# Patient Record
Sex: Female | Born: 1960
Health system: Southern US, Community
[De-identification: ages and names within clinical notes are randomized; demographics above are authoritative.]

## PROBLEM LIST (undated history)

## (undated) DIAGNOSIS — I10 Essential (primary) hypertension: Secondary | ICD-10-CM

## (undated) DIAGNOSIS — D689 Coagulation defect, unspecified: Secondary | ICD-10-CM

## (undated) DIAGNOSIS — D649 Anemia, unspecified: Secondary | ICD-10-CM

## (undated) DIAGNOSIS — T7840XA Allergy, unspecified, initial encounter: Secondary | ICD-10-CM

## (undated) DIAGNOSIS — M1990 Unspecified osteoarthritis, unspecified site: Secondary | ICD-10-CM

## (undated) DIAGNOSIS — D573 Sickle-cell trait: Secondary | ICD-10-CM

## (undated) DIAGNOSIS — R0602 Shortness of breath: Secondary | ICD-10-CM

## (undated) HISTORY — DX: Sickle-cell trait: D57.3

## (undated) HISTORY — DX: Unspecified osteoarthritis, unspecified site: M19.90

## (undated) HISTORY — DX: Essential (primary) hypertension: I10

## (undated) HISTORY — DX: Anemia, unspecified: D64.9

## (undated) HISTORY — DX: Coagulation defect, unspecified: D68.9

## (undated) HISTORY — DX: Allergy, unspecified, initial encounter: T78.40XA

---

## 2003-02-28 ENCOUNTER — Emergency Department (HOSPITAL_COMMUNITY): Admission: EM | Admit: 2003-02-28 | Discharge: 2003-02-28 | Payer: Self-pay | Admitting: Emergency Medicine

## 2003-02-28 ENCOUNTER — Encounter: Payer: Self-pay | Admitting: Emergency Medicine

## 2003-10-28 ENCOUNTER — Emergency Department (HOSPITAL_COMMUNITY): Admission: EM | Admit: 2003-10-28 | Discharge: 2003-10-28 | Payer: Self-pay | Admitting: Family Medicine

## 2003-11-03 ENCOUNTER — Emergency Department (HOSPITAL_COMMUNITY): Admission: EM | Admit: 2003-11-03 | Discharge: 2003-11-03 | Payer: Self-pay | Admitting: Family Medicine

## 2003-11-18 ENCOUNTER — Encounter: Admission: RE | Admit: 2003-11-18 | Discharge: 2003-11-18 | Payer: Self-pay | Admitting: Internal Medicine

## 2003-12-01 ENCOUNTER — Encounter: Admission: RE | Admit: 2003-12-01 | Discharge: 2003-12-01 | Payer: Self-pay | Admitting: Internal Medicine

## 2004-01-19 ENCOUNTER — Emergency Department (HOSPITAL_COMMUNITY): Admission: EM | Admit: 2004-01-19 | Discharge: 2004-01-19 | Payer: Self-pay | Admitting: Emergency Medicine

## 2005-02-05 ENCOUNTER — Ambulatory Visit: Payer: Self-pay | Admitting: *Deleted

## 2005-02-05 ENCOUNTER — Ambulatory Visit: Payer: Self-pay | Admitting: Nurse Practitioner

## 2005-02-26 ENCOUNTER — Ambulatory Visit: Payer: Self-pay | Admitting: Nurse Practitioner

## 2005-03-01 ENCOUNTER — Ambulatory Visit: Payer: Self-pay | Admitting: Nurse Practitioner

## 2005-03-26 ENCOUNTER — Ambulatory Visit: Payer: Self-pay | Admitting: Nurse Practitioner

## 2005-08-05 ENCOUNTER — Ambulatory Visit: Payer: Self-pay | Admitting: Family Medicine

## 2005-12-04 ENCOUNTER — Ambulatory Visit: Payer: Self-pay | Admitting: Nurse Practitioner

## 2005-12-13 ENCOUNTER — Encounter: Admission: RE | Admit: 2005-12-13 | Discharge: 2005-12-13 | Payer: Self-pay | Admitting: Internal Medicine

## 2006-02-06 ENCOUNTER — Ambulatory Visit: Payer: Self-pay | Admitting: Nurse Practitioner

## 2006-05-15 ENCOUNTER — Ambulatory Visit: Payer: Self-pay | Admitting: Nurse Practitioner

## 2006-10-27 ENCOUNTER — Ambulatory Visit: Payer: Self-pay | Admitting: Nurse Practitioner

## 2006-12-18 ENCOUNTER — Ambulatory Visit: Payer: Self-pay | Admitting: Nurse Practitioner

## 2007-01-21 ENCOUNTER — Ambulatory Visit: Payer: Self-pay | Admitting: Nurse Practitioner

## 2007-05-26 ENCOUNTER — Ambulatory Visit: Payer: Self-pay | Admitting: Internal Medicine

## 2007-05-26 ENCOUNTER — Encounter (INDEPENDENT_AMBULATORY_CARE_PROVIDER_SITE_OTHER): Payer: Self-pay | Admitting: Nurse Practitioner

## 2007-05-26 LAB — CONVERTED CEMR LAB
Albumin: 4 g/dL (ref 3.5–5.2)
CO2: 24 meq/L (ref 19–32)
Glucose, Bld: 103 mg/dL — ABNORMAL HIGH (ref 70–99)
HCT: 32.1 % — ABNORMAL LOW (ref 36.0–46.0)
Lymphocytes Relative: 42 % (ref 12–46)
Lymphs Abs: 1.7 10*3/uL (ref 0.7–3.3)
Neutrophils Relative %: 49 % (ref 43–77)
Platelets: 271 10*3/uL (ref 150–400)
Potassium: 3.8 meq/L (ref 3.5–5.3)
Sodium: 140 meq/L (ref 135–145)
Total Protein: 7.1 g/dL (ref 6.0–8.3)
WBC: 4.1 10*3/uL (ref 4.0–10.5)

## 2007-05-27 ENCOUNTER — Encounter (INDEPENDENT_AMBULATORY_CARE_PROVIDER_SITE_OTHER): Payer: Self-pay | Admitting: *Deleted

## 2007-07-24 ENCOUNTER — Ambulatory Visit: Payer: Self-pay | Admitting: Family Medicine

## 2007-07-24 ENCOUNTER — Encounter (INDEPENDENT_AMBULATORY_CARE_PROVIDER_SITE_OTHER): Payer: Self-pay | Admitting: Nurse Practitioner

## 2007-07-24 LAB — CONVERTED CEMR LAB
AST: 15 units/L (ref 0–37)
Albumin: 4 g/dL (ref 3.5–5.2)
BUN: 13 mg/dL (ref 6–23)
Basophils Relative: 1 % (ref 0–1)
Calcium: 9.2 mg/dL (ref 8.4–10.5)
Chloride: 104 meq/L (ref 96–112)
HDL: 55 mg/dL (ref >39)
Lymphs Abs: 1.9 10*3/uL (ref 0.7–4.0)
Monocytes Relative: 9 % (ref 3–12)
Neutro Abs: 2.2 10*3/uL (ref 1.7–7.7)
Neutrophils Relative %: 49 % (ref 43–77)
Potassium: 4.7 meq/L (ref 3.5–5.3)
RBC: 4.54 M/uL (ref 3.87–5.11)
TSH: 3.984 microintl units/mL (ref 0.350–5.50)
Total Protein: 7.3 g/dL (ref 6.0–8.3)
WBC: 4.6 10*3/uL (ref 4.0–10.5)

## 2009-01-31 ENCOUNTER — Ambulatory Visit: Payer: Self-pay | Admitting: Internal Medicine

## 2009-02-07 ENCOUNTER — Ambulatory Visit (HOSPITAL_COMMUNITY): Admission: RE | Admit: 2009-02-07 | Discharge: 2009-02-07 | Payer: Self-pay | Admitting: Internal Medicine

## 2009-02-08 ENCOUNTER — Ambulatory Visit: Payer: Self-pay | Admitting: Cardiology

## 2009-02-08 ENCOUNTER — Encounter: Payer: Self-pay | Admitting: Internal Medicine

## 2009-02-08 ENCOUNTER — Ambulatory Visit (HOSPITAL_COMMUNITY): Admission: RE | Admit: 2009-02-08 | Discharge: 2009-02-08 | Payer: Self-pay | Admitting: Internal Medicine

## 2009-05-02 ENCOUNTER — Ambulatory Visit: Payer: Self-pay | Admitting: Family Medicine

## 2009-07-17 ENCOUNTER — Ambulatory Visit: Payer: Self-pay | Admitting: Internal Medicine

## 2009-07-18 ENCOUNTER — Encounter (INDEPENDENT_AMBULATORY_CARE_PROVIDER_SITE_OTHER): Payer: Self-pay | Admitting: Internal Medicine

## 2009-08-29 ENCOUNTER — Ambulatory Visit: Payer: Self-pay | Admitting: Family Medicine

## 2009-08-29 LAB — CONVERTED CEMR LAB
BUN: 13 mg/dL (ref 6–23)
CO2: 26 meq/L (ref 19–32)
Chloride: 103 meq/L (ref 96–112)
Eosinophils Relative: 2 % (ref 0–5)
Glucose, Bld: 92 mg/dL (ref 70–99)
HCT: 35.3 % — ABNORMAL LOW (ref 36.0–46.0)
Hemoglobin: 11.3 g/dL — ABNORMAL LOW (ref 12.0–15.0)
Lymphocytes Relative: 43 % (ref 12–46)
Lymphs Abs: 1.7 10*3/uL (ref 0.7–4.0)
Monocytes Absolute: 0.3 10*3/uL (ref 0.1–1.0)
Monocytes Relative: 8 % (ref 3–12)
Potassium: 4.2 meq/L (ref 3.5–5.3)
RBC: 4.48 M/uL (ref 3.87–5.11)
RDW: 15.8 % — ABNORMAL HIGH (ref 11.5–15.5)
Sodium: 140 meq/L (ref 135–145)
WBC: 3.8 10*3/uL — ABNORMAL LOW (ref 4.0–10.5)

## 2009-09-05 ENCOUNTER — Ambulatory Visit: Payer: Self-pay | Admitting: Family Medicine

## 2009-09-22 ENCOUNTER — Ambulatory Visit: Payer: Self-pay | Admitting: Internal Medicine

## 2009-10-19 ENCOUNTER — Ambulatory Visit: Payer: Self-pay | Admitting: Family Medicine

## 2011-02-28 ENCOUNTER — Inpatient Hospital Stay (HOSPITAL_COMMUNITY)
Admission: AD | Admit: 2011-02-28 | Discharge: 2011-02-28 | Disposition: A | Discharge status: Home / Self Care | Payer: Medicaid Other | Source: Ambulatory Visit | Attending: Obstetrics & Gynecology | Admitting: Obstetrics & Gynecology

## 2011-02-28 ENCOUNTER — Inpatient Hospital Stay (HOSPITAL_COMMUNITY): Payer: Medicaid Other

## 2011-02-28 DIAGNOSIS — N390 Urinary tract infection, site not specified: Secondary | ICD-10-CM | POA: Insufficient documentation

## 2011-02-28 DIAGNOSIS — N898 Other specified noninflammatory disorders of vagina: Secondary | ICD-10-CM

## 2011-02-28 DIAGNOSIS — D259 Leiomyoma of uterus, unspecified: Secondary | ICD-10-CM

## 2011-02-28 DIAGNOSIS — I10 Essential (primary) hypertension: Secondary | ICD-10-CM | POA: Insufficient documentation

## 2011-02-28 DIAGNOSIS — Z79899 Other long term (current) drug therapy: Secondary | ICD-10-CM | POA: Insufficient documentation

## 2011-02-28 LAB — URINE MICROSCOPIC-ADD ON

## 2011-02-28 LAB — URINALYSIS, ROUTINE W REFLEX MICROSCOPIC
Glucose, UA: NEGATIVE mg/dL
Ketones, ur: NEGATIVE mg/dL
Protein, ur: NEGATIVE mg/dL
pH: 5.5 (ref 5.0–8.0)

## 2011-02-28 LAB — WET PREP, GENITAL
Clue Cells Wet Prep HPF POC: NONE SEEN
Trich, Wet Prep: NONE SEEN

## 2011-02-28 LAB — POCT PREGNANCY, URINE: Preg Test, Ur: NEGATIVE

## 2011-03-01 LAB — GC/CHLAMYDIA PROBE AMP, GENITAL: Chlamydia, DNA Probe: NEGATIVE

## 2011-04-18 ENCOUNTER — Other Ambulatory Visit (HOSPITAL_COMMUNITY)
Admission: RE | Admit: 2011-04-18 | Discharge: 2011-04-18 | Disposition: A | Discharge status: Home / Self Care | Payer: Medicaid Other | Source: Ambulatory Visit | Attending: Family Medicine | Admitting: Family Medicine

## 2011-04-18 ENCOUNTER — Encounter: Payer: Self-pay | Admitting: Family Medicine

## 2011-04-18 ENCOUNTER — Ambulatory Visit (INDEPENDENT_AMBULATORY_CARE_PROVIDER_SITE_OTHER): Payer: Self-pay | Admitting: Family Medicine

## 2011-04-18 VITALS — BP 190/117 | HR 78 | Temp 97.9°F | Ht 66.0 in | Wt 260.0 lb

## 2011-04-18 DIAGNOSIS — I152 Hypertension secondary to endocrine disorders: Secondary | ICD-10-CM | POA: Insufficient documentation

## 2011-04-18 DIAGNOSIS — D259 Leiomyoma of uterus, unspecified: Secondary | ICD-10-CM

## 2011-04-18 DIAGNOSIS — Z01419 Encounter for gynecological examination (general) (routine) without abnormal findings: Secondary | ICD-10-CM

## 2011-04-18 DIAGNOSIS — N921 Excessive and frequent menstruation with irregular cycle: Secondary | ICD-10-CM | POA: Insufficient documentation

## 2011-04-18 DIAGNOSIS — I10 Essential (primary) hypertension: Secondary | ICD-10-CM

## 2011-04-18 LAB — POCT PREGNANCY, URINE: Preg Test, Ur: NEGATIVE

## 2011-04-18 MED ORDER — NAPROXEN 500 MG PO TABS: 500.0000 mg | ORAL_TABLET | Freq: Two times a day (BID) | ORAL | Status: DC

## 2011-04-18 MED ORDER — MEDROXYPROGESTERONE ACETATE 10 MG PO TABS: 10.0000 mg | ORAL_TABLET | Freq: Every day | ORAL | Status: DC

## 2011-04-18 NOTE — Assessment & Plan Note (Signed)
CBC,TSH Endometrial biopsy

## 2011-04-18 NOTE — Assessment & Plan Note (Signed)
Consider Depo Lupron +/- definitive surgery after that with BP control.

## 2011-04-18 NOTE — Assessment & Plan Note (Signed)
Referral to [facility]

## 2011-04-18 NOTE — Patient Instructions (Addendum)
Menorrhagia (Heavy Periods) During a normal menstrual period, most women lose about 2 ounces of blood or less. If there is a lot more blood loss than this, it is called menorrhagia. HOME CARE  Only take medicine as directed by your doctor.   Heavy bleeding may cause you to lack iron in your body. Your doctor may prescribe iron pills. Take as told to you by your doctor.   Do not take aspirin one week before or during your period. Aspirin can make the bleeding worse.   If you change your tampon or pad more than once every 2 hours, stay in bed for a while. This may help lessen the bleeding.   Eat a healthy diet. Eat foods with iron. These foods include leafy green vegetables, meat, liver, eggs and whole grain breads and cereals.   Eat foods that are high in Vitamin C. These include oranges, orange juice, and grapefruits. Vitamin C can help your body take in more iron.   Do not try to lose weight. Wait until the heavy bleeding has stopped and your iron level is normal.  CAUSES  Women who are going through menopause (periods stop) may have heavy bleeding at times.   Having more than 5 pregnancies.   Having growths (fibroids or polyps) in the uterus.   Endometriosis. This is when pieces of the uterus lining grow outside the uterus or in the belly (stomach).   Adenomyosis. This is when pieces of the uterus lining grow into the  uterus muscle.   Uterus cancer or infections in the vagina or ovaries.   Blood disease. Blood does not clot as it should.  TREATMENT The doctor may:  Have you take birth control pills.   Put a special IUD (Intrauterine Device) into your uterus.   Have you take a hormone such as Provera.   Give you a hormone by needle (injection) such as Depo-Provera.  GET HELP IF:  You need to change your tampon or pad more than once an hour.   You feel sick to your stomach, throw up and have watery poop (diarrhea).   You have problems from any medicine the doctor  has you on.  GET HELP RIGHT AWAY IF YOU:  Get a fever.   Have trouble breathing.   Bleed even more heavily than usual and pass blood clots.   Feel dizzy, weak or faint.  MAKE SURE YOU:   Understand these instructions.   Will watch your condition.   Will get help right away if you are not doing well or get worse.  Document Released: 06/04/2008 Document Re-Released: 09/17/2009 [facility] Patient Information 2011 [place], [place].Fibroids (Leiomyoma's) You have been diagnosed as having a fibroid. Fibroids are smooth muscle lumps (tumors) which can occur any place in a woman's body. They are usually in the womb (uterus). The most common problem (symptom) of fibroids is bleeding. Over time this may cause low red blood cells (anemia). Other symptoms include feelings of pressure and pain in the pelvis. The diagnosis (learning what is wrong) of fibroids is made by physical exam. Sometimes tests such as an ultrasound are used. This is helpful when fibroids are felt around the ovaries and to look for tumors. TREATMENT  Most fibroids do not need surgical or medical treatment. Sometimes a tissue sample (biopsy) of the lining of the uterus is done to rule out cancer. If there is no cancer and only a small amount of bleeding, the problem can be watched.   Hormonal treatment can  improve the problem.   When surgery is needed, it can consist of removing the fibroid. Vaginal birth may not be possible after the removal of fibroids. This depends on where they are and the extent of surgery. When pregnancy occurs with fibroids it is usually normal.   Your caregiver can help decide which treatments are best for you.  HOME CARE INSTRUCTIONS  Do not use aspirin as this may increase bleeding problems.   If your periods (menses) are heavy, record the number of pads or tampons used per month. Bring this information to your caregiver. This can help them determine the best treatment for you.  SEEK IMMEDIATE MEDICAL  ATTENTION IF:  You have pelvic pain or cramps not controlled with medications, or experience a sudden increase in pain.   You have an increase of pelvic bleeding between and during menses.   You feel lightheaded or have fainting spells.   You develop worsening belly (abdominal) pain.  Document Released: 08/23/2000 Document Re-Released: 12/03/2007 [facility] Patient Information 2011 [place], [place].

## 2011-04-18 NOTE — Progress Notes (Signed)
Subjective:    Lindsay Travis is a 50 y.o. female who presents with uterine fibroids. Periods are regular every 28-30 days, lasting 14-17 days. Dysmenorrhea:severe, occurring throughout menses. Cyclic symptoms include pelvic pain. No intermenstrual bleeding, spotting, or discharge.  Having severe pain and heavy bleeding x 3 mos.  Had pelvic sonogram which did not show endometrium well but did have 16 cm large fibroid uterus.  She is having low back pain and pressure.  She is losing urine spontaneously without having true urge or GSUI sx's.  ? Spasm.  Current contraception: none History of abnormal Pap smear: no Family history of uterine or ovarian cancer: no Regular self breast exam: yes History of abnormal mammogram: no Family history of breast cancer: no History of abnormal lipids: no Menstrual History: OB History    Grav Para Term Preterm Abortions TAB SAB Ect Mult Living   5 5 5  0 0 0 0 0 0 5      Menarche age: 35 Patient's last menstrual period was 04/18/2011. Period Cycle (Days): 30  Period Duration (Days): 14-17 Menstrual Flow: Heavy Menstrual Control: Maxi pad Dysmenorrhea: Moderate Dysmenorrhea Symptoms: Cramping;Other (Comment) (fatigue)  The following portions of the patient's history were reviewed and updated as appropriate: allergies, current medications, past family history, past medical history, past social history, past surgical history and problem list.  Review of Systems A comprehensive review of systems was negative.    Objective:     BP 190/117  Pulse 78  Temp(Src) 97.9 F (36.6 C) (Oral)  Ht 5\' 6"  (1.676 m)  Wt 260 lb (117.935 kg)  BMI 41.97 kg/m2  LMP 04/18/2011 BP 190/117  Pulse 78  Temp(Src) 97.9 F (36.6 C) (Oral)  Ht 5\' 6"  (1.676 m)  Wt 260 lb (117.935 kg)  BMI 41.97 kg/m2  LMP 04/18/2011  General Appearance:    Alert, cooperative, no distress, appears stated age  Head:    Normocephalic, without obvious abnormality, atraumatic             Neck:   Supple, symmetrical, trachea midline, no adenopathy;    thyroid:  no enlargement/tenderness/nodules; no carotid   bruit      Lungs:     Clear to auscultation bilaterally, respirations unlabored  Chest Wall:    No tenderness or deformity   Heart:    Regular rate and rhythm, S1 and S2 normal, no murmur, rub   or gallop  Breast Exam:    No tenderness, masses, or nipple abnormality  Abdomen:     Soft, non-tender, bowel sounds active all four quadrants,    Large mass in lower abdomen, no organomegaly  Genitalia:    NEFG, BUS is WNL, Vagina pink and ruggated, Cervix multiparous and without lesion, Uterus is very enlarged, approx. 20 wk size and firm and tender.  Adnexa could not be appreciated.     Extremities:   Extremities normal, atraumatic, no cyanosis or edema  Pulses:   2+ and symmetric all extremities  Skin:   Skin color, texture, turgor normal, no rashes or lesions  Lymph nodes:   Cervical, supraclavicular, and axillary nodes normal      Procedure: Patient given informed consent, signed copy in the chart, time out was performed. Appropriate time out taken.  The patient was placed in the lithotomy position and the cervix brought into view with sterile speculum.  Portio of cervix cleansed x 1 with betadine swab.  A tenaculum was placed in the anterior lip of the cervix.  The uterus was  sounded for depth of 16cm. A pipelle was introduced to into the uterus, suction created,  and an endometrial sample was obtained x 4 passes. All equipment was removed and accounted for.  The patient tolerated the procedure well.       Assessment:   Symptomatic uterine fibroids.  Menorrhagia HTN-poorly contolled.   Plan:   Await pap smear results. Endometrial biopsy - see separate procedure note. CBC, TSH Patient given post procedure instructions. The patient will return in 4 weeks for results. Mammogram-scholarship info given. Application for DepoLupron. Provera to try to stem  bleeding. Naprosyn for pain. Needs improved BP control.-refer to [facility]

## 2011-04-18 NOTE — Progress Notes (Signed)
Addended by: Sherre Lain A on: 04/18/2011 03:12 PM   Modules accepted: Orders

## 2011-04-18 NOTE — Progress Notes (Signed)
Re-checked blood pressure 175/107 pulse 66

## 2011-04-18 NOTE — Progress Notes (Signed)
Addended by: Sherre Lain A on: 04/18/2011 03:34 PM   Modules accepted: Orders

## 2011-04-18 NOTE — Progress Notes (Signed)
Addended by: Reva Bores on: 04/18/2011 02:38 PM   Modules accepted: Orders

## 2011-04-19 LAB — CBC
HCT: 33.2 % — ABNORMAL LOW (ref 36.0–46.0)
Hemoglobin: 10.7 g/dL — ABNORMAL LOW (ref 12.0–15.0)
MCHC: 32.2 g/dL (ref 30.0–36.0)
RBC: 4.3 MIL/uL (ref 3.87–5.11)

## 2011-04-19 LAB — TSH: TSH: 3.291 u[IU]/mL (ref 0.350–4.500)

## 2011-06-20 ENCOUNTER — Other Ambulatory Visit (HOSPITAL_COMMUNITY): Payer: Self-pay | Admitting: Obstetrics

## 2011-06-20 DIAGNOSIS — Z1231 Encounter for screening mammogram for malignant neoplasm of breast: Secondary | ICD-10-CM

## 2011-07-08 ENCOUNTER — Ambulatory Visit (HOSPITAL_COMMUNITY)
Admission: RE | Admit: 2011-07-08 | Discharge: 2011-07-08 | Disposition: A | Discharge status: Home / Self Care | Payer: Medicaid Other | Source: Ambulatory Visit | Attending: Obstetrics | Admitting: Obstetrics

## 2011-07-08 DIAGNOSIS — Z1231 Encounter for screening mammogram for malignant neoplasm of breast: Secondary | ICD-10-CM

## 2011-08-15 ENCOUNTER — Other Ambulatory Visit: Payer: Self-pay | Admitting: Family Medicine

## 2011-09-05 ENCOUNTER — Other Ambulatory Visit: Payer: Self-pay

## 2011-09-05 ENCOUNTER — Encounter (HOSPITAL_COMMUNITY): Payer: Self-pay | Admitting: Pharmacy Technician

## 2011-09-05 ENCOUNTER — Encounter (HOSPITAL_COMMUNITY): Payer: Self-pay

## 2011-09-05 ENCOUNTER — Encounter (HOSPITAL_COMMUNITY)
Admission: RE | Admit: 2011-09-05 | Discharge: 2011-09-05 | Disposition: A | Discharge status: Home / Self Care | Payer: Medicaid Other | Source: Ambulatory Visit | Attending: Obstetrics | Admitting: Obstetrics

## 2011-09-05 HISTORY — DX: Shortness of breath: R06.02

## 2011-09-05 LAB — BASIC METABOLIC PANEL
Chloride: 103 mEq/L (ref 96–112)
Creatinine, Ser: 0.74 mg/dL (ref 0.50–1.10)
GFR calc Af Amer: >90 mL/min (ref >90)
GFR calc non Af Amer: >90 mL/min (ref >90)

## 2011-09-05 LAB — CBC
MCHC: 32.4 g/dL (ref 30.0–36.0)
MCV: 79.9 fL (ref 78.0–100.0)
Platelets: 218 10*3/uL (ref 150–400)
RDW: 15.7 % — ABNORMAL HIGH (ref 11.5–15.5)
WBC: 3.5 10*3/uL — ABNORMAL LOW (ref 4.0–10.5)

## 2011-09-05 LAB — SURGICAL PCR SCREEN: Staphylococcus aureus: NEGATIVE

## 2011-09-05 NOTE — Patient Instructions (Signed)
YOUR PROCEDURE IS SCHEDULED ON: 09/10/10  ENTER THROUGH THE MAIN ENTRANCE OF [facility] AT:6am  USE DESK PHONE AND DIAL [phone] TO INFORM us OF YOUR ARRIVAL  CALL [phone] IF YOU HAVE ANY QUESTIONS OR PROBLEMS PRIOR TO YOUR ARRIVAL.  REMEMBER: DO NOT EAT OR DRINK AFTER MIDNIGHT : Tuesday  SPECIAL INSTRUCTIONS:   YOU MAY BRUSH YOUR TEETH THE MORNING OF SURGERY   TAKE THESE MEDICINES THE DAY OF SURGERY WITH SIP OF WATER: Benicar HCT   DO NOT WEAR JEWELRY, EYE MAKEUP, LIPSTICK OR DARK FINGERNAIL POLISH DO NOT WEAR LOTIONS  DO NOT SHAVE FOR 48 HOURS PRIOR TO SURGERY  YOU WILL NOT BE ALLOWED TO DRIVE YOURSELF HOME.  NAME OF DRIVER: daughterLenora Boys [phone]

## 2011-09-06 ENCOUNTER — Other Ambulatory Visit: Payer: Self-pay | Admitting: Obstetrics

## 2011-09-09 NOTE — H&P (Signed)
NAMERayvon Char NO.:  [id number]  MEDICAL RECORD NO.:  [id number]  LOCATION:  [facility]                         FACILITY:  [facility]  PHYSICIAN:  Kathreen Cosier, M.D.DATE OF BIRTH:  05/11/1961  DATE OF ADMISSION:  08/14/2011 DATE OF DISCHARGE:                             HISTORY & PHYSICAL   This is a 50 year old female, gravida 5, para 5-0-0-5, who was seen on June 20, 2011, with a history of bleeding continuously since September.  Ultrasound confirm multiple fibroids and she had an endometrial biopsy, which showed scant benign tissue.  The patient also has hypertension and seen by Dr. Concepcion Elk, and she is on Benicar HCT 40/12.5 daily and Bystolic 10 mg daily.  PAST MEDICAL HISTORY:  Apart from hypertension, negative.  PAST SURGICAL HISTORY:  Negative.  SOCIAL HISTORY:  Noncontributory.  SYSTEM REVIEW:  Negative except for an allergy to aspirin.  PHYSICAL EXAMINATION:  GENERAL:  Revealed a well-developed female in no distress. HEENT:  Negative. LUNGS:  Clear. HEART:  Regular rhythm.  No murmurs.  No gallops. BREASTS:  No masses. ABDOMEN:  A mass arising from the pelvis, 22-24 weeks size, multiple myomas. PELVIC:  The Pap smear was negative. EXTREMITIES:  Negative.          ______________________________ Kathreen Cosier, M.D.     BAM/MEDQ  D:  09/09/2011  T:  09/09/2011  Job:  [id number]

## 2011-09-11 ENCOUNTER — Inpatient Hospital Stay (HOSPITAL_COMMUNITY)
Admission: RE | Admit: 2011-09-11 | Discharge: 2011-09-14 | DRG: 743 | Disposition: A | Discharge status: Home / Self Care | Payer: Medicaid Other | Source: Ambulatory Visit | Attending: Obstetrics | Admitting: Obstetrics

## 2011-09-11 ENCOUNTER — Other Ambulatory Visit: Payer: Self-pay | Admitting: Obstetrics

## 2011-09-11 ENCOUNTER — Inpatient Hospital Stay (HOSPITAL_COMMUNITY): Payer: Medicaid Other | Admitting: Anesthesiology

## 2011-09-11 ENCOUNTER — Encounter (HOSPITAL_COMMUNITY): Payer: Self-pay | Admitting: Anesthesiology

## 2011-09-11 ENCOUNTER — Encounter (HOSPITAL_COMMUNITY)
Admission: RE | Disposition: A | Discharge status: Home / Self Care | Payer: Self-pay | Source: Ambulatory Visit | Attending: Obstetrics

## 2011-09-11 ENCOUNTER — Encounter (HOSPITAL_COMMUNITY): Payer: Self-pay | Admitting: General Practice

## 2011-09-11 DIAGNOSIS — D250 Submucous leiomyoma of uterus: Principal | ICD-10-CM | POA: Diagnosis present

## 2011-09-11 DIAGNOSIS — D252 Subserosal leiomyoma of uterus: Secondary | ICD-10-CM | POA: Diagnosis present

## 2011-09-11 DIAGNOSIS — D259 Leiomyoma of uterus, unspecified: Secondary | ICD-10-CM

## 2011-09-11 DIAGNOSIS — D279 Benign neoplasm of unspecified ovary: Secondary | ICD-10-CM | POA: Diagnosis present

## 2011-09-11 DIAGNOSIS — I10 Essential (primary) hypertension: Secondary | ICD-10-CM

## 2011-09-11 DIAGNOSIS — Z01818 Encounter for other preprocedural examination: Secondary | ICD-10-CM

## 2011-09-11 DIAGNOSIS — D251 Intramural leiomyoma of uterus: Secondary | ICD-10-CM | POA: Diagnosis present

## 2011-09-11 DIAGNOSIS — E1165 Type 2 diabetes mellitus with hyperglycemia: Secondary | ICD-10-CM

## 2011-09-11 DIAGNOSIS — Z01812 Encounter for preprocedural laboratory examination: Secondary | ICD-10-CM

## 2011-09-11 DIAGNOSIS — N921 Excessive and frequent menstruation with irregular cycle: Secondary | ICD-10-CM

## 2011-09-11 HISTORY — PX: ABDOMINAL HYSTERECTOMY: SHX81

## 2011-09-11 LAB — GLUCOSE, CAPILLARY
Glucose-Capillary: 167 mg/dL — ABNORMAL HIGH (ref 70–99)
Glucose-Capillary: 175 mg/dL — ABNORMAL HIGH (ref 70–99)
Glucose-Capillary: 179 mg/dL — ABNORMAL HIGH (ref 70–99)

## 2011-09-11 LAB — CBC
MCH: 26.1 pg (ref 26.0–34.0)
MCV: 80.6 fL (ref 78.0–100.0)
Platelets: 181 10*3/uL (ref 150–400)
RBC: 3.76 MIL/uL — ABNORMAL LOW (ref 3.87–5.11)
RDW: 15.3 % (ref 11.5–15.5)

## 2011-09-11 SURGERY — HYSTERECTOMY, ABDOMINAL
Anesthesia: General

## 2011-09-11 MED ORDER — LABETALOL HCL 5 MG/ML IV SOLN
INTRAVENOUS | Status: DC | PRN
  Administered 2011-09-11: 10 mg via INTRAVENOUS

## 2011-09-11 MED ORDER — ROCURONIUM BROMIDE 50 MG/5ML IV SOLN
INTRAVENOUS | Status: AC
  Filled 2011-09-11: qty 1

## 2011-09-11 MED ORDER — CEFAZOLIN SODIUM 1-5 GM-% IV SOLN
1.0000 g | INTRAVENOUS | Status: AC
  Administered 2011-09-11: 1 g via INTRAVENOUS

## 2011-09-11 MED ORDER — KETOROLAC TROMETHAMINE 30 MG/ML IJ SOLN: 30.0000 mg | Freq: Once | INTRAMUSCULAR | Status: DC

## 2011-09-11 MED ORDER — LABETALOL HCL 5 MG/ML IV SOLN
INTRAVENOUS | Status: AC
  Administered 2011-09-11: 5 mg via INTRAVENOUS
  Filled 2011-09-11: qty 4

## 2011-09-11 MED ORDER — ROCURONIUM BROMIDE 100 MG/10ML IV SOLN
INTRAVENOUS | Status: DC | PRN
  Administered 2011-09-11: 10 mg via INTRAVENOUS
  Administered 2011-09-11: 5 mg via INTRAVENOUS
  Administered 2011-09-11: 35 mg via INTRAVENOUS

## 2011-09-11 MED ORDER — HYDROMORPHONE HCL PF 1 MG/ML IJ SOLN
INTRAMUSCULAR | Status: DC | PRN
  Administered 2011-09-11: 2 mg via INTRAVENOUS

## 2011-09-11 MED ORDER — HYDROMORPHONE HCL PF 1 MG/ML IJ SOLN
0.2500 mg | INTRAMUSCULAR | Status: DC | PRN
  Administered 2011-09-11 (×3): 0.5 mg via INTRAVENOUS

## 2011-09-11 MED ORDER — NALOXONE HCL 0.4 MG/ML IJ SOLN: 0.4000 mg | INTRAMUSCULAR | Status: DC | PRN

## 2011-09-11 MED ORDER — LIDOCAINE HCL (CARDIAC) 20 MG/ML IV SOLN
INTRAVENOUS | Status: DC | PRN
  Administered 2011-09-11: 80 mg via INTRAVENOUS

## 2011-09-11 MED ORDER — LABETALOL HCL 5 MG/ML IV SOLN
10.0000 mg | INTRAVENOUS | Status: DC | PRN
  Administered 2011-09-11: 10 mg via INTRAVENOUS
  Filled 2011-09-11: qty 4

## 2011-09-11 MED ORDER — DEXAMETHASONE SODIUM PHOSPHATE 10 MG/ML IJ SOLN
INTRAMUSCULAR | Status: AC
  Filled 2011-09-11: qty 1

## 2011-09-11 MED ORDER — OLMESARTAN MEDOXOMIL-HCTZ 40-12.5 MG PO TABS: 1.0000 | ORAL_TABLET | Freq: Every day | ORAL | Status: DC

## 2011-09-11 MED ORDER — HYDROMORPHONE 0.3 MG/ML IV SOLN
INTRAVENOUS | Status: AC
  Filled 2011-09-11: qty 25

## 2011-09-11 MED ORDER — ONDANSETRON HCL 4 MG/2ML IJ SOLN: 4.0000 mg | Freq: Four times a day (QID) | INTRAMUSCULAR | Status: DC | PRN

## 2011-09-11 MED ORDER — SODIUM CHLORIDE 0.9 % IJ SOLN: 9.0000 mL | INTRAMUSCULAR | Status: DC | PRN

## 2011-09-11 MED ORDER — FENTANYL CITRATE 0.05 MG/ML IJ SOLN
INTRAMUSCULAR | Status: AC
  Filled 2011-09-11: qty 2

## 2011-09-11 MED ORDER — PROPOFOL 10 MG/ML IV EMUL
INTRAVENOUS | Status: AC
  Filled 2011-09-11: qty 20

## 2011-09-11 MED ORDER — FENTANYL CITRATE 0.05 MG/ML IJ SOLN
INTRAMUSCULAR | Status: DC | PRN
  Administered 2011-09-11: 50 ug via INTRAVENOUS
  Administered 2011-09-11 (×3): 100 ug via INTRAVENOUS

## 2011-09-11 MED ORDER — NEOSTIGMINE METHYLSULFATE 1 MG/ML IJ SOLN
INTRAMUSCULAR | Status: AC
  Filled 2011-09-11: qty 10

## 2011-09-11 MED ORDER — IBUPROFEN 800 MG PO TABS
800.0000 mg | ORAL_TABLET | Freq: Three times a day (TID) | ORAL | Status: DC | PRN
  Administered 2011-09-12 – 2011-09-14 (×3): 800 mg via ORAL
  Filled 2011-09-11 (×3): qty 1

## 2011-09-11 MED ORDER — HYDROMORPHONE HCL PF 1 MG/ML IJ SOLN
INTRAMUSCULAR | Status: AC
  Filled 2011-09-11: qty 2

## 2011-09-11 MED ORDER — ONDANSETRON HCL 4 MG/2ML IJ SOLN
INTRAMUSCULAR | Status: AC
  Filled 2011-09-11: qty 2

## 2011-09-11 MED ORDER — KETOROLAC TROMETHAMINE 30 MG/ML IJ SOLN: 30.0000 mg | Freq: Four times a day (QID) | INTRAMUSCULAR | Status: DC

## 2011-09-11 MED ORDER — LACTATED RINGERS IV SOLN
INTRAVENOUS | Status: DC
  Administered 2011-09-11 (×3): via INTRAVENOUS

## 2011-09-11 MED ORDER — LACTATED RINGERS IV SOLN
INTRAVENOUS | Status: DC
  Administered 2011-09-11 – 2011-09-12 (×2): via INTRAVENOUS

## 2011-09-11 MED ORDER — HYDROMORPHONE HCL PF 1 MG/ML IJ SOLN
INTRAMUSCULAR | Status: AC
  Filled 2011-09-11: qty 1

## 2011-09-11 MED ORDER — GLYCOPYRROLATE 0.2 MG/ML IJ SOLN
INTRAMUSCULAR | Status: DC | PRN
  Administered 2011-09-11: 0.1 mg via INTRAVENOUS
  Administered 2011-09-11: 1 mg via INTRAVENOUS

## 2011-09-11 MED ORDER — INSULIN ASPART 100 UNIT/ML ~~LOC~~ SOLN
0.0000 [IU] | SUBCUTANEOUS | Status: DC
  Administered 2011-09-11 – 2011-09-12 (×3): 3 [IU] via SUBCUTANEOUS
  Administered 2011-09-12: 2 [IU] via SUBCUTANEOUS
  Filled 2011-09-11: qty 3

## 2011-09-11 MED ORDER — LABETALOL HCL 5 MG/ML IV SOLN
INTRAVENOUS | Status: AC
  Filled 2011-09-11: qty 4

## 2011-09-11 MED ORDER — DIPHENHYDRAMINE HCL 50 MG/ML IJ SOLN: 12.5000 mg | Freq: Four times a day (QID) | INTRAMUSCULAR | Status: DC | PRN

## 2011-09-11 MED ORDER — MIDAZOLAM HCL 5 MG/5ML IJ SOLN
INTRAMUSCULAR | Status: DC | PRN
  Administered 2011-09-11: 1 mg via INTRAVENOUS

## 2011-09-11 MED ORDER — FENTANYL CITRATE 0.05 MG/ML IJ SOLN
INTRAMUSCULAR | Status: AC
  Filled 2011-09-11: qty 5

## 2011-09-11 MED ORDER — MIDAZOLAM HCL 2 MG/2ML IJ SOLN
INTRAMUSCULAR | Status: AC
  Filled 2011-09-11: qty 2

## 2011-09-11 MED ORDER — OLMESARTAN MEDOXOMIL 40 MG PO TABS
40.0000 mg | ORAL_TABLET | Freq: Every day | ORAL | Status: DC
  Administered 2011-09-12 – 2011-09-14 (×2): 40 mg via ORAL
  Filled 2011-09-11 (×4): qty 1

## 2011-09-11 MED ORDER — DIPHENHYDRAMINE HCL 12.5 MG/5ML PO ELIX: 12.5000 mg | ORAL_SOLUTION | Freq: Four times a day (QID) | ORAL | Status: DC | PRN

## 2011-09-11 MED ORDER — GLYCOPYRROLATE 0.2 MG/ML IJ SOLN
INTRAMUSCULAR | Status: AC
  Filled 2011-09-11: qty 2

## 2011-09-11 MED ORDER — ZOLPIDEM TARTRATE 5 MG PO TABS: 5.0000 mg | ORAL_TABLET | Freq: Every evening | ORAL | Status: DC | PRN

## 2011-09-11 MED ORDER — HYDROMORPHONE HCL PF 1 MG/ML IJ SOLN: 0.2000 mg | INTRAMUSCULAR | Status: DC | PRN

## 2011-09-11 MED ORDER — HYDROCHLOROTHIAZIDE 12.5 MG PO CAPS
12.5000 mg | ORAL_CAPSULE | Freq: Every day | ORAL | Status: DC
  Administered 2011-09-12 – 2011-09-14 (×2): 12.5 mg via ORAL
  Filled 2011-09-11 (×4): qty 1

## 2011-09-11 MED ORDER — PROPOFOL 10 MG/ML IV EMUL
INTRAVENOUS | Status: DC | PRN
  Administered 2011-09-11: 150 mg via INTRAVENOUS

## 2011-09-11 MED ORDER — HYDROMORPHONE 0.3 MG/ML IV SOLN
INTRAVENOUS | Status: DC
  Administered 2011-09-11: 16:00:00 via INTRAVENOUS
  Administered 2011-09-11: 3 mL via INTRAVENOUS
  Administered 2011-09-11: 2.1 mg via INTRAVENOUS
  Administered 2011-09-12: 1.5 mg via INTRAVENOUS
  Administered 2011-09-12: 0.6 mg via INTRAVENOUS

## 2011-09-11 MED ORDER — NEOSTIGMINE METHYLSULFATE 1 MG/ML IJ SOLN
INTRAMUSCULAR | Status: DC | PRN
  Administered 2011-09-11: 5 mg via INTRAVENOUS

## 2011-09-11 MED ORDER — CEFAZOLIN SODIUM 1-5 GM-% IV SOLN
INTRAVENOUS | Status: AC
  Filled 2011-09-11: qty 50

## 2011-09-11 MED ORDER — LABETALOL HCL 5 MG/ML IV SOLN
5.0000 mg | INTRAVENOUS | Status: DC | PRN
  Administered 2011-09-11 (×7): 5 mg via INTRAVENOUS

## 2011-09-11 MED ORDER — OXYCODONE-ACETAMINOPHEN 5-325 MG PO TABS
1.0000 | ORAL_TABLET | ORAL | Status: DC | PRN
  Administered 2011-09-12: 2 via ORAL
  Administered 2011-09-12 (×2): 1 via ORAL
  Administered 2011-09-13 – 2011-09-14 (×4): 2 via ORAL
  Filled 2011-09-11 (×2): qty 1
  Filled 2011-09-11 (×5): qty 2

## 2011-09-11 MED ORDER — LIDOCAINE HCL (CARDIAC) 20 MG/ML IV SOLN
INTRAVENOUS | Status: AC
  Filled 2011-09-11: qty 5

## 2011-09-11 MED ORDER — ONDANSETRON HCL 4 MG/2ML IJ SOLN
INTRAMUSCULAR | Status: DC | PRN
  Administered 2011-09-11: 4 mg via INTRAVENOUS

## 2011-09-11 MED ORDER — NEBIVOLOL HCL 10 MG PO TABS
10.0000 mg | ORAL_TABLET | Freq: Every day | ORAL | Status: DC
  Administered 2011-09-11 – 2011-09-14 (×4): 10 mg via ORAL
  Filled 2011-09-11 (×5): qty 1

## 2011-09-11 SURGICAL SUPPLY — 35 items
ADH SKN CLS APL DERMABOND .7 (GAUZE/BANDAGES/DRESSINGS) ×2
CANISTER SUCTION 2500CC (MISCELLANEOUS) ×2 IMPLANT
CHLORAPREP W/TINT 26ML (MISCELLANEOUS) ×2 IMPLANT
CLOTH BEACON ORANGE TIMEOUT ST (SAFETY) ×2 IMPLANT
DECANTER SPIKE VIAL GLASS SM (MISCELLANEOUS) IMPLANT
DERMABOND ADVANCED (GAUZE/BANDAGES/DRESSINGS) ×2
DERMABOND ADVANCED .7 DNX12 (GAUZE/BANDAGES/DRESSINGS) IMPLANT
GAUZE SPONGE 4X4 16PLY XRAY LF (GAUZE/BANDAGES/DRESSINGS) ×2 IMPLANT
GLOVE BIO SURGEON STRL SZ8.5 (GLOVE) ×4 IMPLANT
GOWN PREVENTION PLUS LG XLONG (DISPOSABLE) ×4 IMPLANT
GOWN PREVENTION PLUS XXLARGE (GOWN DISPOSABLE) ×2 IMPLANT
NDL HYPO 25X1 1.5 SAFETY (NEEDLE) IMPLANT
NEEDLE HYPO 25X1 1.5 SAFETY (NEEDLE) IMPLANT
NS IRRIG 1000ML POUR BTL (IV SOLUTION) ×2 IMPLANT
PACK ABDOMINAL GYN (CUSTOM PROCEDURE TRAY) ×2 IMPLANT
PAD OB MATERNITY 4.3X12.25 (PERSONAL CARE ITEMS) ×2 IMPLANT
SPONGE LAP 18X18 X RAY DECT (DISPOSABLE) ×5 IMPLANT
STAPLER VISISTAT 35W (STAPLE) IMPLANT
SUT CHROMIC 0 CT 1 (SUTURE) ×2 IMPLANT
SUT CHROMIC 1 CT1 27 (SUTURE) ×4 IMPLANT
SUT CHROMIC 1 MO 4 27  MS/3 (SUTURE) IMPLANT
SUT CHROMIC 1MO 4 18 CR8 (SUTURE) ×6 IMPLANT
SUT CHROMIC 2 0 CT 1 (SUTURE) ×2 IMPLANT
SUT CHROMIC GUT AB #0 18 (SUTURE) ×2 IMPLANT
SUT MON AB 4-0 PS1 27 (SUTURE) ×2 IMPLANT
SUT PDS AB 0 CT 36 (SUTURE) ×4 IMPLANT
SUT PLAIN 3 0 TIES 3 18 (SUTURE) IMPLANT
SUT VIC AB 0 CT1 18XCR BRD8 (SUTURE) IMPLANT
SUT VIC AB 0 CT1 27 (SUTURE) ×2
SUT VIC AB 0 CT1 27XBRD ANBCTR (SUTURE) ×1 IMPLANT
SUT VIC AB 0 CT1 8-18 (SUTURE)
SYR CONTROL 10ML LL (SYRINGE) IMPLANT
TOWEL OR 17X24 6PK STRL BLUE (TOWEL DISPOSABLE) ×4 IMPLANT
TRAY FOLEY CATH 14FR (SET/KITS/TRAYS/PACK) ×2 IMPLANT
WATER STERILE IRR 1000ML POUR (IV SOLUTION) ×2 IMPLANT

## 2011-09-11 NOTE — Op Note (Signed)
preop diagnosis myoma uteri Postop diagnosis the same Surgeon Dr. Francoise Ceo First Asst. Dr. Coral Ceo Procedure patient placed on the operating table in the supine position after this general anesthesia administered abdomen prepped and draped bladder and to the Foley catheter a transverse suprapubic incision made carried down to the rectus fascia fascia cleaned and incised the length of the incision recti muscles retracted laterally peritoneum incised longitudinally should've 22 weeks size myoma the right round ligament was grasped with Kelly clamp cut suture ligated with #1 chromic procedure some a similar fashion + on the right utero-ovarian ligament grasped with Kelly clamp cut suture ligated #1 chromic proceeded in a similar fashion on that side uterine vessels were skeletonized bilaterally using the Metzenbaum scissors the bladder flap was developed and pushed the bladder pushed him off cervix the and uterine vessels were doubly clamped on the right in in clamps cut suture ligated #1 chromic x2 a similar fashion inside the uterus was removed at the cervicovaginal junction with Mayo scissors then the cervix transgastric local day it a day cardinal and uterosacral ligaments grasped with straight KoKo the right scotomata negative #1 chromic proceeded in a similar fashion no sign cervix was removed the cervicovaginal junction with Mayo scissors the wall was closed with #1 chromic hemostasis satisfactory bleeding from the right ovary and tube correction the left ovary and tube so Kelly clamp is across the infundibulopelvic ligament this was cut suture ligated double chromic blood loss was a total cc operative site was hemostatic lap and sponge counts correct abdomen closed in layers peritoneum continuous with 2-0 chromic fascia continuous with of 0 Dexon and the skin shows a subcuticular stitch of 40 patient tolerated the procedure well taken to recovery room in good condition end of dictation  dictated by Dr. Gaynell Face on 213 taken

## 2011-09-11 NOTE — Anesthesia Preprocedure Evaluation (Signed)
Anesthesia Evaluation  Patient identified by MRN, date of birth, ID band Patient awake    Reviewed: Allergy & Precautions, H&P , Patient's Chart, lab work & pertinent test results, reviewed documented beta blocker date and time   Airway Mallampati: III TM Distance: >3 FB Neck ROM: full    Dental No notable dental hx.    Pulmonary  clear to auscultation  Pulmonary exam normal       Cardiovascular hypertension (DBP 94 on Pre-op visit, Today a bit higher), On Medications and On Home Beta Blockers regular Normal    Neuro/Psych    GI/Hepatic   Endo/Other  Morbid obesityNo Rx patient told she might have had high sugar in recent Doctors visit  Renal/GU      Musculoskeletal   Abdominal   Peds  Hematology   Anesthesia Other Findings   Reproductive/Obstetrics                           Anesthesia Physical Anesthesia Plan  ASA: III  Anesthesia Plan: General   Post-op Pain Management:    Induction: Intravenous  Airway Management Planned: Oral ETT  Additional Equipment:   Intra-op Plan:   Post-operative Plan:   Informed Consent: I have reviewed the patients History and Physical, chart, labs and discussed the procedure including the risks, benefits and alternatives for the proposed anesthesia with the patient or authorized representative who has indicated his/her understanding and acceptance.   Dental Advisory Given and Dental advisory given  Plan Discussed with: CRNA and Surgeon  Anesthesia Plan Comments: (  Discussed  general anesthesia, including possible nausea, instrumentation of airway, sore throat,pulmonary aspiration, etc. I asked if the were any outstanding questions, or  concerns before we proceeded. )        Anesthesia Quick Evaluation

## 2011-09-11 NOTE — H&P (Signed)
  There is no change in her history and her physical exam today reveals no changes or other problem continues to be her elevated blood pressure she took her medication for 2 PM today and

## 2011-09-11 NOTE — Anesthesia Procedure Notes (Signed)
Procedure Name: Intubation Date/Time: 09/11/2011 7:46 AM Performed by: Karleen Dolphin Pre-anesthesia Checklist: Suction available, Emergency Drugs available, Timeout performed, Patient identified and Patient being monitored Patient Re-evaluated:Patient Re-evaluated prior to inductionOxygen Delivery Method: Circle System Utilized Preoxygenation: Pre-oxygenation with 100% oxygen Intubation Type: IV induction Ventilation: Mask ventilation without difficulty Grade View: Grade I Tube type: Oral Tube size: 7.0 mm Number of attempts: 1 Airway Equipment and Method: patient positioned with wedge pillow,  stylet and video-laryngoscopy Placement Confirmation: ETT inserted through vocal cords under direct vision,  positive ETCO2 and breath sounds checked- equal and bilateral Secured at: 21 cm Tube secured with: Tape Dental Injury: Teeth and Oropharynx as per pre-operative assessment  Difficulty Due To: Difficulty was anticipated

## 2011-09-11 NOTE — Anesthesia Postprocedure Evaluation (Signed)
Anesthesia Post Note  Patient: Lindsay Travis  Procedure(s) Performed:  HYSTERECTOMY ABDOMINAL  Anesthesia type: GA  Patient location: PACU  Post pain: Pain level controlled  Post assessment: Post-op Vital signs reviewed  Last Vitals:  Filed Vitals:   09/11/11 1100  BP: 165/121  Pulse:   Temp:   Resp: 5    Post vital signs: Reviewed  Level of consciousness: sedated  Complications: No apparent anesthesia complications

## 2011-09-11 NOTE — Transfer of Care (Signed)
Immediate Anesthesia Transfer of Care Note  Patient: Lindsay Travis  Procedure(s) Performed:  HYSTERECTOMY ABDOMINAL  Patient Location: PACU  Anesthesia Type: General  Level of Consciousness: awake, alert  and oriented  Airway & Oxygen Therapy: Patient Spontanous Breathing and Patient connected to nasal cannula oxygen  Post-op Assessment: Report given to PACU RN  Post vital signs: Reviewed and stable  Complications: No apparent anesthesia complications

## 2011-09-12 ENCOUNTER — Encounter (HOSPITAL_COMMUNITY): Payer: Self-pay | Admitting: Obstetrics

## 2011-09-12 LAB — CBC
HCT: 26.6 % — ABNORMAL LOW (ref 36.0–46.0)
MCV: 80.1 fL (ref 78.0–100.0)
RBC: 3.32 MIL/uL — ABNORMAL LOW (ref 3.87–5.11)
RDW: 15.3 % (ref 11.5–15.5)
WBC: 6.3 10*3/uL (ref 4.0–10.5)

## 2011-09-12 LAB — GLUCOSE, CAPILLARY
Glucose-Capillary: 139 mg/dL — ABNORMAL HIGH (ref 70–99)
Glucose-Capillary: 148 mg/dL — ABNORMAL HIGH (ref 70–99)
Glucose-Capillary: 178 mg/dL — ABNORMAL HIGH (ref 70–99)

## 2011-09-12 MED ORDER — INSULIN ASPART 100 UNIT/ML ~~LOC~~ SOLN
0.0000 [IU] | Freq: Three times a day (TID) | SUBCUTANEOUS | Status: DC
  Administered 2011-09-12 (×2): 3 [IU] via SUBCUTANEOUS
  Administered 2011-09-12 – 2011-09-13 (×3): 2 [IU] via SUBCUTANEOUS
  Administered 2011-09-13: 3 [IU] via SUBCUTANEOUS
  Administered 2011-09-14: 2 [IU] via SUBCUTANEOUS

## 2011-09-12 NOTE — Progress Notes (Signed)
Patient ID: Lindsay Travis, female   DOB: October 19, 1960, 51 y.o.   MRN: [id number] Postoperative day #1 Blood pressure 161/100 01 pulse 77 respirations 16 hemoglobin 8+ good bowel sounds no vaginal bleeding blood output DC Foley DC IV 2000-calorie low-salt diet doing well and and

## 2011-09-12 NOTE — Progress Notes (Signed)
UR Chart review completed.  

## 2011-09-13 LAB — GLUCOSE, CAPILLARY
Glucose-Capillary: 143 mg/dL — ABNORMAL HIGH (ref 70–99)
Glucose-Capillary: 176 mg/dL — ABNORMAL HIGH (ref 70–99)
Glucose-Capillary: 125 mg/dL — ABNORMAL HIGH (ref 70–99)

## 2011-09-13 NOTE — Progress Notes (Signed)
Patient ID: Lindsay Travis, female   DOB: 05/12/1961, 51 y.o.   MRN: [id number] Postoperative day 2 Vital signs normal The bowel sounds passing flatus This legs negative This no complaints and and

## 2011-09-14 MED ORDER — METFORMIN HCL ER 500 MG PO TB24: 1000.0000 mg | ORAL_TABLET | Freq: Two times a day (BID) | ORAL | Status: DC

## 2011-09-14 MED ORDER — IBUPROFEN 800 MG PO TABS: 800.0000 mg | ORAL_TABLET | Freq: Three times a day (TID) | ORAL | Status: DC | PRN

## 2011-09-14 MED ORDER — POLYSACCHARIDE IRON COMPLEX 100 MG/5ML PO ELIX: 100.0000 mg | ORAL_SOLUTION | Freq: Two times a day (BID) | ORAL | Status: DC

## 2011-09-14 MED ORDER — OXYCODONE-ACETAMINOPHEN 5-500 MG PO TABS: 1.0000 | ORAL_TABLET | ORAL | Status: DC | PRN

## 2011-09-14 NOTE — Discharge Summary (Signed)
Physician Discharge Summary  Patient ID: Lindsay Travis MRN: [id number] DOB/AGE: [birth year] 51 y.o.  Admit date: 09/11/2011 Discharge date: 09/14/2011  Admission Diagnoses: Symptomatic  Uterine Fibroids. Discharge Diagnoses: Same Active Problems:  * No active hospital problems. *    Discharged Condition: good  Hospital Course: 3 days  Consults: none  Significant Diagnostic Studies: labs: CBC  Treatments: IV hydration  Discharge Exam: Blood pressure 128/74, pulse 71, temperature 98.7 F (37.1 C), temperature source Oral, resp. rate 18, height 5\' 6"  (1.676 m), weight 113.399 kg (250 lb), last menstrual period 09/10/2011, SpO2 97.00%. General appearance: alert and no distress Resp: clear to auscultation bilaterally Cardio: regular rate and rhythm, S1, S2 normal, no murmur, click, rub or gallop GI: soft, non-tender; bowel sounds normal; no masses,  no organomegaly Extremities: extremities normal, atraumatic, no cyanosis or edema Skin: Skin color, texture, turgor normal. No rashes or lesions Incision/Wound:  Healing well.  Disposition: Home or Self Care  Discharge Orders    Future Orders Please Complete By Expires   Discharge patient        Medication List  As of 09/14/2011 11:52 AM   START taking these medications         ibuprofen 800 MG tablet   Commonly known as: ADVIL,MOTRIN   Take 1 tablet (800 mg total) by mouth every 8 (eight) hours as needed for pain (mild pain).      metFORMIN 500 MG 24 hr tablet   Commonly known as: GLUCOPHAGE-XR   Take 2 tablets (1,000 mg total) by mouth 2 (two) times daily after Travis meal.      polysaccharide iron complex 100 MG/5ML elixir   Commonly known as: NIFEREX   Take 5 mLs (100 mg total) by mouth 2 (two) times daily.         CONTINUE taking these medications         nebivolol 10 MG tablet   Commonly known as: BYSTOLIC      olmesartan-hydrochlorothiazide 40-12.5 MG per tablet   Commonly known as: BENICAR HCT     oxycodone-acetaminophen 5-500 MG per tablet   Commonly known as: ROXICET   Take 1 tablet by mouth every 4 (four) hours as needed. pain         STOP taking these medications         medroxyPROGESTERone 10 MG tablet      naproxen 500 MG tablet          Where to get your medications    These are the prescriptions that you need to pick up. We sent them to [facility] pharmacy, so you will need to go there to get them.   [facility] [address], [place] - [address] AT [facility] [address]    [address] [place] [address]    Phone: [phone]        ibuprofen 800 MG tablet   metFORMIN 500 MG 24 hr tablet   polysaccharide iron complex 100 MG/5ML elixir         You may get these medications from any pharmacy.         oxycodone-acetaminophen 5-500 MG per tablet           Follow-up Information    Follow up with Lindsay A, MD. Make an appointment in 2 weeks.   Contact information:   [address] [place] [address] [phone]  Signed: Seab Travis Travis 09/14/2011, 11:52 AM

## 2011-09-14 NOTE — Progress Notes (Signed)
D/c instructions reviewed with pt. And daughters.  Pt. And family state understanding of same.  No home equipment needed.  Wheelchair to car with staff without incident.  D/c'd home with family.

## 2011-09-14 NOTE — Progress Notes (Signed)
3 Days Post-Op Procedure(s): HYSTERECTOMY ABDOMINAL  Subjective: Patient reports tolerating PO.    Objective: I have reviewed patient's vital signs, intake and output, medications, labs and pathology.  General: alert and no distress Resp: clear to auscultation bilaterally Cardio: regular rate and rhythm, S1, S2 normal, no murmur, click, rub or gallop GI: soft, non-tender; bowel sounds normal; no masses,  no organomegaly Extremities: extremities normal, atraumatic, no cyanosis or edema Vaginal Bleeding: minimal  Assessment: s/p Procedure(s): HYSTERECTOMY ABDOMINAL: stable, progressing well and tolerating diet  Plan: Discharge home  LOS: 3 days    Jung Ingerson A 09/14/2011, 11:22 AM

## 2011-12-16 ENCOUNTER — Other Ambulatory Visit: Payer: Self-pay

## 2011-12-16 DIAGNOSIS — I83893 Varicose veins of bilateral lower extremities with other complications: Secondary | ICD-10-CM

## 2012-02-21 ENCOUNTER — Encounter: Payer: Self-pay | Admitting: Vascular Surgery

## 2012-02-24 ENCOUNTER — Encounter: Payer: Self-pay | Admitting: Vascular Surgery

## 2012-02-24 ENCOUNTER — Encounter (INDEPENDENT_AMBULATORY_CARE_PROVIDER_SITE_OTHER): Payer: Medicaid Other | Admitting: *Deleted

## 2012-02-24 ENCOUNTER — Ambulatory Visit (INDEPENDENT_AMBULATORY_CARE_PROVIDER_SITE_OTHER): Payer: Medicaid Other | Admitting: Vascular Surgery

## 2012-02-24 VITALS — BP 150/94 | HR 59 | Resp 18 | Ht 66.0 in | Wt 250.0 lb

## 2012-02-24 DIAGNOSIS — M7989 Other specified soft tissue disorders: Secondary | ICD-10-CM

## 2012-02-24 DIAGNOSIS — I83893 Varicose veins of bilateral lower extremities with other complications: Secondary | ICD-10-CM | POA: Insufficient documentation

## 2012-02-24 NOTE — Progress Notes (Signed)
Subjective:     Patient ID: Lindsay Travis, female   DOB: 11-11-1960, 51 y.o.   MRN: [id number]  HPI this 51 year old female was referred for possible venous insufficiency of both lower extremities. She denies a history of DVT, thrombophlebitis, stasis ulcers, or bleeding. She has no bulging varicosities. She has discomfort in both knees which sounds mechanical in nature. She also has some aching in the legs as the day progresses with a heavy feeling. She does not wear elastic compression stockings nor elevate her legs. Both legs are equally symptomatic.  Past Medical History  Diagnosis Date  . Hypertension   . Anemia   . Shortness of breath     on exertion- has low hgb  . Diabetes mellitus     recent high blood sugar and has RX but not taking  med -b/c she doesn't think she  is diabetic, just had lots of sugar in her diet when  dx'd.    History  Substance Use Topics  . Smoking status: Never Smoker   . Smokeless tobacco: Never Used  . Alcohol Use: No    No family history on file.  Allergies  Allergen Reactions  . Aspirin Other (See Comments)    Abdominal pain    Current outpatient prescriptions:metFORMIN (GLUCOPHAGE XR) 500 MG 24 hr tablet, Take 2 tablets (1,000 mg total) by mouth 2 (two) times daily after a meal., Disp: 120 tablet, Rfl: 11;  nebivolol (BYSTOLIC) 10 MG tablet, Take 10 mg by mouth daily.  , Disp: , Rfl: ;  olmesartan-hydrochlorothiazide (BENICAR HCT) 40-12.5 MG per tablet, Take 1 tablet by mouth daily.  , Disp: , Rfl:  oxycodone-acetaminophen (ROXICET) 5-500 MG per tablet, Take 1 tablet by mouth every 4 (four) hours as needed. pain, Disp: 40 tablet, Rfl: 0;  polysaccharide iron complex (NIFEREX) 100 MG/5ML elixir, Take 5 mLs (100 mg total) by mouth 2 (two) times daily., Disp: 236 mL, Rfl: 12;  ibuprofen (ADVIL,MOTRIN) 800 MG tablet, Take 1 tablet (800 mg total) by mouth every 8 (eight) hours as needed for pain (mild pain)., Disp: 30 tablet, Rfl: 5  BP 150/94  Pulse  59  Resp 18  Ht 5\' 6"  (1.676 m)  Wt 250 lb (113.399 kg)  BMI 40.35 kg/m2  Body mass index is 40.35 kg/(m^2).         Review of Systems complains of dyspnea on exertion, occasional chest discomfort and pressure, diabetes mellitus.     Objective:   Physical Exam blood pressure 150/94 heart rate 59 respirations 18 Gen.-alert and oriented x3 in no apparent distress HEENT normal for age Lungs no rhonchi or wheezing Cardiovascular regular rhythm no murmurs carotid pulses 3+ palpable no bruits audible Abdomen soft nontender no palpable masses-obese Musculoskeletal free of  major deformities Skin clear -no rashes Neurologic normal Lower extremities 3+ femoral and dorsalis pedis pulses palpable bilaterally with no edema Diffuse spider veins in both lower extremities in the medial and distal thighs with no bulging varicosities noted. There is no hyperpigmentation ulceration or significant distal edema   Today I ordered a venous duplex exam of both leg which are reviewed and interpreted. There is no DVT. There is mild reflux in the deep system bilaterally. There is some reflux in the right great saphenous vein but it is not a large vein. Small saphenous veins are unremarkable.     Assessment:     No evidence of severe venous insufficiency-diffuse bilateral spider veins Knee pain possibly secondary to degenerative joint disease  Plan:     Is not need further evaluation of arterial or venous system-consider orthopedic evaluation if needed pain continues

## 2012-03-03 NOTE — Procedures (Unsigned)
LOWER EXTREMITY VENOUS REFLUX EXAM  INDICATION:  Varicose veins.  EXAM:  Using color-flow imaging and pulse Doppler spectral analysis, the bilateral common femoral, femoral, popliteal, posterior tibial, great and small saphenous veins were evaluated.  There is evidence suggesting deep venous insufficiency in the bilateral lower extremity.  The bilateral saphenofemoral junction is competent.  The right greater saphenous vein is not competent with Reflux of >517milliseconds with the caliber as described below.  The left distal greater saphenous vein at thigh is not competent with Reflux of >537miliseconds.   The bilateral proximal small saphenous vein demonstrates competency.  GSV Diameter (used if found to be incompetent only)                                           Right    Left Proximal Greater Saphenous Vein           0.75 cm  cm Proximal-to-mid-thigh                     0.49 cm  cm Mid thigh                                 0.45 cm  cm Mid-distal thigh                          0.40 cm  cm Distal thigh                              4.0 cm   0.45 Knee                                      4.3 cm   cm  IMPRESSION: 1. The right greater saphenous vein is not competent with Reflux of     >570milliseconds. 2. The left distal thigh greater saphenous vein is not competent with     Reflux of >59milliseconds. 3. The bilateral greater saphenous vein is not tortuous. 4. The deep venous system is mild incompetent with Reflux     of>5102milliseconds. 5. The bilateral small saphenous vein is competent. 6. No evidence of deep vein thrombosis bilaterally.  ___________________________________________ Lindsay Travis [place], M.D.  SS/MEDQ  D:  02/24/2012  T:  02/24/2012  Job:  [id number]

## 2012-05-26 ENCOUNTER — Ambulatory Visit (INDEPENDENT_AMBULATORY_CARE_PROVIDER_SITE_OTHER): Payer: Medicaid Other | Admitting: Family Medicine

## 2012-05-26 ENCOUNTER — Ambulatory Visit
Admission: RE | Admit: 2012-05-26 | Discharge: 2012-05-26 | Disposition: A | Discharge status: Home / Self Care | Payer: Medicaid Other | Source: Ambulatory Visit | Attending: Sports Medicine | Admitting: Sports Medicine

## 2012-05-26 VITALS — BP 146/100 | Ht 66.0 in | Wt 245.0 lb

## 2012-05-26 DIAGNOSIS — M25562 Pain in left knee: Secondary | ICD-10-CM

## 2012-05-26 DIAGNOSIS — M25561 Pain in right knee: Secondary | ICD-10-CM

## 2012-05-26 DIAGNOSIS — M25569 Pain in unspecified knee: Secondary | ICD-10-CM

## 2012-05-26 NOTE — Assessment & Plan Note (Signed)
Patient had sterioid injections today.  X-rays show that patient does have severe DJD of knees bilaterally. Patient given tramadol to help with pain and told to decrease antiinflammatories secondary to HTN.  Patient will also do formal PT to help with strengthening as well.  Patient will F/u in 4 weeks. If not better than will need to consider viscosupplementation and maybe referral to orthopaedic surgeon depending on how much her knees are impacting her ADL's and if pain is not controlled.

## 2012-05-26 NOTE — Patient Instructions (Signed)
Very nice to meet you. I gave you an injection in both knees today. This should help the pain. In addition to this I'm giving you some exercises you can do at home. I when she to go over to [facility] imaging or to [facility] and had x-rays of your knees done. I will call you when I get these results. Continue the same pain medicine as you were taking previously but only take Aleve or the naproxen please do not take both. If you're still having pain you can add Tylenol 650 mg 3 times a day. I'm also going to send you to physical therapy and they should be calling you to set up an appointment. I would like you to come back again in 4-6 weeks for reevaluation.

## 2012-05-26 NOTE — Progress Notes (Signed)
Chief complaint: Bilateral knee pain. Patient is a very pleasant 51 year old female coming in with bilateral knee pain. Patient states that she has had it for a couple years and has gradually been worsening over time.  No acute injury.  Described the pain as chronic dull ache with shooting pains from time to time.  Pain is worse on the lateral and anterior aspects of the knee bilateral. Patient states she is still able to walk but has been making sure she is near something to hold on to incase she falls. Patient states the pain wakes her up at night as well. Patient has tried some naproxen and aleve with not much benefit.   Past Medical History  Diagnosis Date  . Hypertension   . Anemia   . Shortness of breath     on exertion- has low hgb  . Diabetes mellitus     recent high blood sugar and has RX but not taking  med -b/c she doesn't think she  is diabetic, just had lots of sugar in her diet when  dx'd.   Past Surgical History  Procedure Date  . No past surgeries   . Abdominal hysterectomy 09/11/2011    Procedure: HYSTERECTOMY ABDOMINAL;  Surgeon: Kathreen Cosier, MD;  Location: [facility];  Service: Gynecology;  Laterality: N/A;   History  Substance Use Topics  . Smoking status: Never Smoker   . Smokeless tobacco: Never Used  . Alcohol Use: No   No family history on file.  Physical exam: Filed Vitals:   05/26/12 1350  BP: 146/100   Gen:NAD alert, mood and affect normal does have thick accent.  Skin: warm and dry and intact Resp: Speaks in full sentences and does not appear SOB Neuro: CN 2-12 intact, EOMI, 2+ DTR distally bilaterally.  Knee:Bilateral Normal to inspection with no erythema but trace effusion Palpation  Tenderness on medial and lateral joint lines b/l but worse won left. Full ROM normal in flexion and extension and lower leg rotation but does have crepitus with movement. Ligaments with solid consistent endpoints including ACL, PCL, LCL, MCL. Negative Mcmurray's and  provocative meniscal tests. painful patellar compression. Patellar and quadriceps tendons unremarkable. Hamstring and quadriceps strength is normal. Gait: antalgic gait secondary to pain  Procedure note: Bilateral knee injections After verbal and written consent given pt was prepped with betadine.  1:3 kenalog 40 to lidocaine used in knees bilaterally.  Pt minimal bleeding dressed with band aid, Pt given red flags to look for pt had better pain control immediatly.

## 2012-06-02 ENCOUNTER — Other Ambulatory Visit: Payer: Self-pay | Admitting: Family Medicine

## 2012-06-02 DIAGNOSIS — Z01419 Encounter for gynecological examination (general) (routine) without abnormal findings: Secondary | ICD-10-CM

## 2012-06-02 DIAGNOSIS — Z1231 Encounter for screening mammogram for malignant neoplasm of breast: Secondary | ICD-10-CM

## 2012-06-23 ENCOUNTER — Ambulatory Visit (INDEPENDENT_AMBULATORY_CARE_PROVIDER_SITE_OTHER): Payer: Self-pay | Admitting: Family Medicine

## 2012-06-23 ENCOUNTER — Encounter: Payer: Self-pay | Admitting: Family Medicine

## 2012-06-23 VITALS — BP 160/100 | HR 69 | Ht 66.0 in | Wt 245.0 lb

## 2012-06-23 DIAGNOSIS — M25569 Pain in unspecified knee: Secondary | ICD-10-CM

## 2012-06-23 DIAGNOSIS — M25561 Pain in right knee: Secondary | ICD-10-CM

## 2012-06-23 NOTE — Progress Notes (Signed)
Subjective: Patient is here for followup of her bilateral knee pain. Patient is having significant improvement since her corticosteroid injection on May 26, 2012. Patient states that it did continue to hurt for about 3 days after the injection but then has steadily improved. Patient is trying to do the exercises on a daily basis. Patient states this has been very beneficial as well. Patient does have the anti-inflammatory and uses it as needed. Last time she to take this medication was about a week ago. Patient denies any new symptoms, any radiation fevers chills or any numbness.  Physical Exam Blood pressure 160/100, pulse 69, height 5\' 6"  (1.676 m), weight 245 lb (111.131 kg).  Gen:NAD alert, mood and affect normal does have thick accent.  Skin: warm and dry and intact Resp: Speaks in full sentences and does not appear SOB Neuro: CN 2-12 intact, EOMI, 2+ DTR distally bilaterally.  Knee:Bilateral Normal to inspection with no erythema but trace effusion Palpation  Tenderness on medial and lateral joint lines b/l but improved from last visit.  Full ROM normal in flexion and extension and lower leg rotation but does have crepitus with movement. Mild lateral tracking of knee cap bilateral.  Ligaments with solid consistent endpoints including ACL, PCL, LCL, MCL. Negative Mcmurray's and provocative meniscal tests. painful patellar compression. Patellar and quadriceps tendons unremarkable. Hamstring and quadriceps strength is norm

## 2012-06-23 NOTE — Patient Instructions (Signed)
Good to see you I am glad you are better Come back if your keens start to hurt again.  I can repeat the steroid injections in 3 months if needed.

## 2012-06-23 NOTE — Assessment & Plan Note (Signed)
Patient had significant improvement with corticosteroid injection. Encourage patient to continue to do the exercises. Patient declined formal physical therapy. Encourage patient to lose weight. Patient will followup on an as-needed basis. Told her we can repeat this injection again in 3 months if needed. Once again and I think she is a candidate for physical supplementation as well but hopefully will not need this.

## 2012-07-08 ENCOUNTER — Ambulatory Visit (HOSPITAL_COMMUNITY)
Admission: RE | Admit: 2012-07-08 | Discharge: 2012-07-08 | Disposition: A | Discharge status: Home / Self Care | Payer: Self-pay | Source: Ambulatory Visit | Attending: Family Medicine | Admitting: Family Medicine

## 2012-07-08 DIAGNOSIS — Z1231 Encounter for screening mammogram for malignant neoplasm of breast: Secondary | ICD-10-CM | POA: Insufficient documentation

## 2013-09-16 ENCOUNTER — Other Ambulatory Visit (HOSPITAL_COMMUNITY): Payer: Self-pay | Admitting: Internal Medicine

## 2013-09-16 DIAGNOSIS — Z1231 Encounter for screening mammogram for malignant neoplasm of breast: Secondary | ICD-10-CM

## 2013-09-24 ENCOUNTER — Ambulatory Visit (HOSPITAL_COMMUNITY): Payer: Self-pay | Attending: Internal Medicine

## 2013-10-19 ENCOUNTER — Ambulatory Visit (HOSPITAL_COMMUNITY)
Admission: RE | Admit: 2013-10-19 | Discharge: 2013-10-19 | Disposition: A | Discharge status: Home / Self Care | Payer: 59 | Source: Ambulatory Visit | Attending: Internal Medicine | Admitting: Internal Medicine

## 2013-10-19 DIAGNOSIS — Z1231 Encounter for screening mammogram for malignant neoplasm of breast: Secondary | ICD-10-CM

## 2013-12-02 ENCOUNTER — Ambulatory Visit (INDEPENDENT_AMBULATORY_CARE_PROVIDER_SITE_OTHER): Payer: 59 | Admitting: Family Medicine

## 2013-12-02 VITALS — BP 160/95 | HR 81 | Temp 98.4°F | Resp 16 | Ht 65.25 in | Wt 247.0 lb

## 2013-12-02 DIAGNOSIS — R6889 Other general symptoms and signs: Secondary | ICD-10-CM

## 2013-12-02 DIAGNOSIS — IMO0001 Reserved for inherently not codable concepts without codable children: Secondary | ICD-10-CM

## 2013-12-02 DIAGNOSIS — I10 Essential (primary) hypertension: Secondary | ICD-10-CM

## 2013-12-02 DIAGNOSIS — J019 Acute sinusitis, unspecified: Secondary | ICD-10-CM

## 2013-12-02 DIAGNOSIS — E1165 Type 2 diabetes mellitus with hyperglycemia: Secondary | ICD-10-CM

## 2013-12-02 DIAGNOSIS — R0981 Nasal congestion: Secondary | ICD-10-CM

## 2013-12-02 DIAGNOSIS — Z131 Encounter for screening for diabetes mellitus: Secondary | ICD-10-CM

## 2013-12-02 LAB — POCT INFLUENZA A/B
Influenza A, POC: NEGATIVE
Influenza B, POC: NEGATIVE

## 2013-12-02 LAB — POCT UA - MICROSCOPIC ONLY
Casts, Ur, LPF, POC: NEGATIVE
Crystals, Ur, HPF, POC: NEGATIVE
Yeast, UA: NEGATIVE

## 2013-12-02 LAB — POCT CBC
Granulocyte percent: 57.3 % (ref 37–80)
HCT, POC: 39.3 % (ref 37.7–47.9)
Hemoglobin: 12.4 g/dL (ref 12.2–16.2)
Lymph, poc: 1.5 (ref 0.6–3.4)
MCH, POC: 27.6 pg (ref 27–31.2)
MCHC: 31.6 g/dL — AB (ref 31.8–35.4)
MCV: 87.6 fL (ref 80–97)
MID (cbc): 0.3 (ref 0–0.9)
MPV: 10.5 fL (ref 0–99.8)
POC Granulocyte: 2.4 (ref 2–6.9)
POC LYMPH PERCENT: 35.2 %L (ref 10–50)
POC MID %: 7.5 % (ref 0–12)
Platelet Count, POC: 201 10*3/uL (ref 142–424)
RBC: 4.49 M/uL (ref 4.04–5.48)
RDW, POC: 14.6 %
WBC: 4.2 10*3/uL — AB (ref 4.6–10.2)

## 2013-12-02 LAB — POCT URINALYSIS DIPSTICK
Bilirubin, UA: NEGATIVE
Glucose, UA: NEGATIVE
Ketones, UA: NEGATIVE
Leukocytes, UA: NEGATIVE
Nitrite, UA: NEGATIVE
Spec Grav, UA: 1.02
Urobilinogen, UA: 0.2
pH, UA: 5.5

## 2013-12-02 LAB — POCT GLYCOSYLATED HEMOGLOBIN (HGB A1C): Hemoglobin A1C: 7.1

## 2013-12-02 MED ORDER — FLUTICASONE PROPIONATE 50 MCG/ACT NA SUSP: 2.0000 | Freq: Every day | NASAL | Status: DC

## 2013-12-02 MED ORDER — AMOXICILLIN-POT CLAVULANATE 875-125 MG PO TABS: 1.0000 | ORAL_TABLET | Freq: Two times a day (BID) | ORAL | Status: DC

## 2013-12-02 MED ORDER — LOSARTAN POTASSIUM 25 MG PO TABS: 25.0000 mg | ORAL_TABLET | Freq: Every day | ORAL | Status: DC

## 2013-12-02 MED ORDER — METFORMIN HCL 500 MG PO TABS: 500.0000 mg | ORAL_TABLET | Freq: Every day | ORAL | Status: DC

## 2013-12-02 NOTE — Progress Notes (Signed)
Chief Complaint:  Chief Complaint  Patient presents with  . Sore Throat    no energy symptoms x 3 days  . Cough    HPI: Lindsay Travis is a 53 y.o. female who is here for  3 day of sinus congestion, rho\inorrhea,  and one day of  HA.  No no fevers or chills. She has msk aches all over. She denies cough . She feels tired  She was also on HTN meds and also was dx with diabetes in the past but not on meds since she feels she can control them--she is in denial She is tired.  Past Medical History  Diagnosis Date  . Hypertension   . Anemia   . Shortness of breath     on exertion- has low hgb  . Diabetes mellitus     recent high blood sugar and has RX but not taking  med -b/c she doesn't think she  is diabetic, just had lots of sugar in her diet when  dx'd.   Past Surgical History  Procedure Laterality Date  . No past surgeries    . Abdominal hysterectomy  09/11/2011    Procedure: HYSTERECTOMY ABDOMINAL;  Surgeon: Frederico Hamman, MD;  Location: [facility];  Service: Gynecology;  Laterality: N/A;   History   Social History  . Marital Status: Divorced    Spouse Name: N/A    Number of Children: N/A  . Years of Education: N/A   Social History Main Topics  . Smoking status: Never Smoker   . Smokeless tobacco: Never Used  . Alcohol Use: No  . Drug Use: No  . Sexual Activity: No   Other Topics Concern  . Not on file   Social History Narrative  . No narrative on file   No family history on file. Allergies  Allergen Reactions  . Aspirin Other (See Comments)    Abdominal pain   Prior to Admission medications   Medication Sig Start Date End Date Taking? Authorizing Provider  ibuprofen (ADVIL,MOTRIN) 800 MG tablet Take 1 tablet (800 mg total) by mouth every 8 (eight) hours as needed for pain (mild pain). 09/14/11 09/24/11  Shelly Bombard, MD  metFORMIN (GLUCOPHAGE XR) 500 MG 24 hr tablet Take 2 tablets (1,000 mg total) by mouth 2 (two) times daily after a meal. 09/14/11  09/13/12  Shelly Bombard, MD  nebivolol (BYSTOLIC) 10 MG tablet Take 10 mg by mouth daily.      Historical Provider, MD  olmesartan-hydrochlorothiazide (BENICAR HCT) 40-12.5 MG per tablet Take 1 tablet by mouth daily.      Historical Provider, MD  oxycodone-acetaminophen (ROXICET) 5-500 MG per tablet Take 1 tablet by mouth every 4 (four) hours as needed. pain 09/14/11   Shelly Bombard, MD  polysaccharide iron complex (NIFEREX) 100 MG/5ML elixir Take 5 mLs (100 mg total) by mouth 2 (two) times daily. 09/14/11 09/13/12  Shelly Bombard, MD     ROS: The patient denies fevers, chills, night sweats, unintentional weight loss, chest pain, palpitations, wheezing, dyspnea on exertion, nausea, vomiting, abdominal pain, dysuria, hematuria, melena, numbness, weakness, or tingling.   All other systems have been reviewed and were otherwise negative with the exception of those mentioned in the HPI and as above.    PHYSICAL EXAM: Filed Vitals:   12/02/13 1811  BP: 160/95  Pulse: 81  Temp: 98.4 F (36.9 C)  Resp: 16   Filed Vitals:   12/02/13 1811  Height: 5' 5.25" (  1.657 m)  Weight: 247 lb (112.038 kg)   Body mass index is 40.81 kg/(m^2).  General: Alert, no acute distress, obese  HEENT:  Normocephalic, atraumatic, oropharynx patent. EOMI, PERRLA. TM nl, + sinus tenderness, no exudates Cardiovascular:  Regular rate and rhythm, no rubs murmurs or gallops.  No Carotid bruits, radial pulse intact. No pedal edema.  Respiratory: Clear to auscultation bilaterally.  No wheezes, rales, or rhonchi.  No cyanosis, no use of accessory musculature GI: No organomegaly, abdomen is soft and non-tender, positive bowel sounds.  No masses. Skin: No rashes. Neurologic: Facial musculature symmetric. Psychiatric: Patient is appropriate throughout our interaction. Lymphatic: No cervical lymphadenopathy Musculoskeletal: Gait intact.   LABS: Results for orders placed in visit on 12/02/13  TSH      Result Value Ref  Range   TSH 2.915  0.350 - 4.500 uIU/mL  COMPREHENSIVE METABOLIC PANEL      Result Value Ref Range   Sodium 139  135 - 145 mEq/L   Potassium 3.7  3.5 - 5.3 mEq/L   Chloride 102  96 - 112 mEq/L   CO2 29  19 - 32 mEq/L   Glucose, Bld 111 (*) 70 - 99 mg/dL   BUN 11  6 - 23 mg/dL   Creat 0.82  0.50 - 1.10 mg/dL   Total Bilirubin 0.3  0.2 - 1.2 mg/dL   Alkaline Phosphatase 58  39 - 117 U/L   AST 17  0 - 37 U/L   ALT 17  0 - 35 U/L   Total Protein 7.1  6.0 - 8.3 g/dL   Albumin 4.2  3.5 - 5.2 g/dL   Calcium 9.4  8.4 - 10.5 mg/dL  POCT CBC      Result Value Ref Range   WBC 4.2 (*) 4.6 - 10.2 K/uL   Lymph, poc 1.5  0.6 - 3.4   POC LYMPH PERCENT 35.2  10 - 50 %L   MID (cbc) 0.3  0 - 0.9   POC MID % 7.5  0 - 12 %M   POC Granulocyte 2.4  2 - 6.9   Granulocyte percent 57.3  37 - 80 %G   RBC 4.49  4.04 - 5.48 M/uL   Hemoglobin 12.4  12.2 - 16.2 g/dL   HCT, POC 39.3  37.7 - 47.9 %   MCV 87.6  80 - 97 fL   MCH, POC 27.6  27 - 31.2 pg   MCHC 31.6 (*) 31.8 - 35.4 g/dL   RDW, POC 14.6     Platelet Count, POC 201  142 - 424 K/uL   MPV 10.5  0 - 99.8 fL  POCT INFLUENZA A/B      Result Value Ref Range   Influenza A, POC Negative     Influenza B, POC Negative    POCT URINALYSIS DIPSTICK      Result Value Ref Range   Color, UA yellow     Clarity, UA clear     Glucose, UA neg     Bilirubin, UA neg     Ketones, UA neg     Spec Grav, UA 1.020     Blood, UA small     pH, UA 5.5     Protein, UA trace     Urobilinogen, UA 0.2     Nitrite, UA neg     Leukocytes, UA Negative    POCT GLYCOSYLATED HEMOGLOBIN (HGB A1C)      Result Value Ref Range   Hemoglobin A1C 7.1  POCT UA - MICROSCOPIC ONLY      Result Value Ref Range   WBC, Ur, HPF, POC 0-2     RBC, urine, microscopic 2-4     Bacteria, U Microscopic trace     Mucus, UA trace     Epithelial cells, urine per micros 1-3     Crystals, Ur, HPF, POC neg     Casts, Ur, LPF, POC neg     Yeast, UA neg       EKG/XRAY:   Primary read  interpreted by Dr. Marin Comment at [facility].   ASSESSMENT/PLAN: Encounter Diagnoses  Name Primary?  . Screening for diabetes mellitus Yes  . Flu-like symptoms   . Nasal congestion   . Type II or unspecified type diabetes mellitus without mention of complication, uncontrolled   . HTN (hypertension)   . Sinusitis, acute    Return in 1 month for diabetes and BP log recheck Restart medications:  metformin 500 mg daily, will do low dose losartan and see how she does. She was on ARB/HCTZ and also betablocker int he past but did not feel good on them Will restart meds slowly for HTN and DM Losartan 25 mg daily, metfromin 500 mg ER daily Rx Augmentin and Flonase Refer to nurtirtion since she is interested in doing this to lose weight so she does not have to take meds Labs pending F/u in 1 month  Gross sideeffects, risk and benefits, and alternatives of medications d/w patient. Patient is aware that all medications have potential sideeffects and we are unable to predict every sideeffect or drug-drug interaction that may occur.  Azel Gumina, [place], DO 12/04/2013 10:58 AM

## 2013-12-02 NOTE — Patient Instructions (Signed)
Sinusitis Sinusitis is redness, soreness, and swelling (inflammation) of the paranasal sinuses. Paranasal sinuses are air pockets within the bones of your face (beneath the eyes, the middle of the forehead, or above the eyes). In healthy paranasal sinuses, mucus is able to drain out, and air is able to circulate through them by way of your nose. However, when your paranasal sinuses are inflamed, mucus and air can become trapped. This can allow bacteria and other germs to grow and cause infection. Sinusitis can develop quickly and last only a short time (acute) or continue over a long period (chronic). Sinusitis that lasts for more than 12 weeks is considered chronic.  CAUSES  Causes of sinusitis include:  Allergies.  Structural abnormalities, such as displacement of the cartilage that separates your nostrils (deviated septum), which can decrease the air flow through your nose and sinuses and affect sinus drainage.  Functional abnormalities, such as when the small hairs (cilia) that line your sinuses and help remove mucus do not work properly or are not present. SYMPTOMS  Symptoms of acute and chronic sinusitis are the same. The primary symptoms are pain and pressure around the affected sinuses. Other symptoms include:  Upper toothache.  Earache.  Headache.  Bad breath.  Decreased sense of smell and taste.  A cough, which worsens when you are lying flat.  Fatigue.  Fever.  Thick drainage from your nose, which often is green and may contain pus (purulent).  Swelling and warmth over the affected sinuses. DIAGNOSIS  Your caregiver will perform a physical exam. During the exam, your caregiver may:  Look in your nose for signs of abnormal growths in your nostrils (nasal polyps).  Tap over the affected sinus to check for signs of infection.  View the inside of your sinuses (endoscopy) with a special imaging device with a light attached (endoscope), which is inserted into your  sinuses. If your caregiver suspects that you have chronic sinusitis, one or more of the following tests may be recommended:  Allergy tests.  Nasal culture A sample of mucus is taken from your nose and sent to a lab and screened for bacteria.  Nasal cytology A sample of mucus is taken from your nose and examined by your caregiver to determine if your sinusitis is related to an allergy. TREATMENT  Most cases of acute sinusitis are related to a viral infection and will resolve on their own within 10 days. Sometimes medicines are prescribed to help relieve symptoms (pain medicine, decongestants, nasal steroid sprays, or saline sprays).  However, for sinusitis related to a bacterial infection, your caregiver will prescribe antibiotic medicines. These are medicines that will help kill the bacteria causing the infection.  Rarely, sinusitis is caused by a fungal infection. In theses cases, your caregiver will prescribe antifungal medicine. For some cases of chronic sinusitis, surgery is needed. Generally, these are cases in which sinusitis recurs more than 3 times per year, despite other treatments. HOME CARE INSTRUCTIONS   Drink plenty of water. Water helps thin the mucus so your sinuses can drain more easily.  Use a humidifier.  Inhale steam 3 to 4 times a day (for example, sit in the bathroom with the shower running).  Apply a warm, moist washcloth to your face 3 to 4 times a day, or as directed by your caregiver.  Use saline nasal sprays to help moisten and clean your sinuses.  Take over-the-counter or prescription medicines for pain, discomfort, or fever only as directed by your caregiver. SEEK IMMEDIATE MEDICAL   CARE IF:  You have increasing pain or severe headaches.  You have nausea, vomiting, or drowsiness.  You have swelling around your face.  You have vision problems.  You have a stiff neck.  You have difficulty breathing. MAKE SURE YOU:   Understand these  instructions.  Will watch your condition.  Will get help right away if you are not doing well or get worse. Document Released: 08/26/2005 Document Revised: 11/18/2011 Document Reviewed: 09/10/2011 [facility] Patient Information 2014 [place], [place]. Diabetes and Exercise Exercising regularly is important. It is not just about losing weight. It has many health benefits, such as:  Improving your overall fitness, flexibility, and endurance.  Increasing your bone density.  Helping with weight control.  Decreasing your body fat.  Increasing your muscle strength.  Reducing stress and tension.  Improving your overall health. People with diabetes who exercise gain additional benefits because exercise:  Reduces appetite.  Improves the body's use of blood sugar (glucose).  Helps lower or control blood glucose.  Decreases blood pressure.  Helps control blood lipids (such as cholesterol and triglycerides).  Improves the body's use of the hormone insulin by:  Increasing the body's insulin sensitivity.  Reducing the body's insulin needs.  Decreases the risk for heart disease because exercising:  Lowers cholesterol and triglycerides levels.  Increases the levels of good cholesterol (such as high-density lipoproteins [HDL]) in the body.  Lowers blood glucose levels. YOUR ACTIVITY PLAN  Choose an activity that you enjoy and set realistic goals. Your health care provider or diabetes educator can help you make an activity plan that works for you. You can break activities into 2 or 3 sessions throughout the day. Doing so is as good as one long session. Exercise ideas include:  Taking the dog for a walk.  Taking the stairs instead of the elevator.  Dancing to your favorite song.  Doing your favorite exercise with a friend. RECOMMENDATIONS FOR EXERCISING WITH TYPE 1 OR TYPE 2 DIABETES   Check your blood glucose before exercising. If blood glucose levels are greater than 240 mg/dL,  check for urine ketones. Do not exercise if ketones are present.  Avoid injecting insulin into areas of the body that are going to be exercised. For example, avoid injecting insulin into:  The arms when playing tennis.  The legs when jogging.  Keep a record of:  Food intake before and after you exercise.  Expected peak times of insulin action.  Blood glucose levels before and after you exercise.  The type and amount of exercise you have done.  Review your records with your health care provider. Your health care provider will help you to develop guidelines for adjusting food intake and insulin amounts before and after exercising.  If you take insulin or oral hypoglycemic agents, watch for signs and symptoms of hypoglycemia. They include:  Dizziness.  Shaking.  Sweating.  Chills.  Confusion.  Drink plenty of water while you exercise to prevent dehydration or heat stroke. Body water is lost during exercise and must be replaced.  Talk to your health care provider before starting an exercise program to make sure it is safe for you. Remember, almost any type of activity is better than none. Document Released: 11/16/2003 Document Revised: 04/28/2013 Document Reviewed: 02/02/2013 [facility] Patient Information 2014 [place]. Hypertension During Pregnancy Hypertension is also called high blood pressure. Blood pressure moves blood in your body. Sometimes, the force that moves the blood becomes too strong. When you are pregnant, this condition should be  watched carefully. It can cause problems for you and your baby. HOME CARE   Make and keep all of your doctor visits.  Take medicine as told by your doctor. Tell your doctor about all medicines you take.  Eat very little salt.  Exercise regularly.  Do not drink alcohol.  Do not smoke.  Do not have drinks with caffeine.  Lie on your left side when resting. GET HELP RIGHT AWAY IF:  You have bad belly (abdominal)  pain.  You have sudden puffiness (swelling) in the hands, ankles, or face.  You gain 4 pounds (1.8 kilograms) or more in 1 week.  You throw up (vomit) repeatedly.  You have bleeding from the vagina.  You do not feel the baby moving as much.  You have a headache.  You have blurred or double vision.  You have muscle twitching or spasms.  You have shortness of breath.  You have blue fingernails and lips.  You have blood in your pee (urine). MAKE SURE YOU:  Understand these instructions.  Will watch your condition.  Will get help right away if you are not doing well or get worse. Document Released: 09/28/2010 Document Revised: 06/16/2013 Document Reviewed: 03/25/2013 [facility] Patient Information 2014 [place], [place].

## 2013-12-03 LAB — COMPREHENSIVE METABOLIC PANEL
ALT: 17 U/L (ref 0–35)
Albumin: 4.2 g/dL (ref 3.5–5.2)
Alkaline Phosphatase: 58 U/L (ref 39–117)
Glucose, Bld: 111 mg/dL — ABNORMAL HIGH (ref 70–99)
Potassium: 3.7 mEq/L (ref 3.5–5.3)
Sodium: 139 mEq/L (ref 135–145)
Total Bilirubin: 0.3 mg/dL (ref 0.2–1.2)
Total Protein: 7.1 g/dL (ref 6.0–8.3)

## 2013-12-03 LAB — COMPREHENSIVE METABOLIC PANEL WITH GFR
AST: 17 U/L (ref 0–37)
BUN: 11 mg/dL (ref 6–23)
CO2: 29 meq/L (ref 19–32)
Calcium: 9.4 mg/dL (ref 8.4–10.5)
Chloride: 102 meq/L (ref 96–112)
Creat: 0.82 mg/dL (ref 0.50–1.10)

## 2013-12-03 LAB — TSH: TSH: 2.915 u[IU]/mL (ref 0.350–4.500)

## 2013-12-04 ENCOUNTER — Encounter: Payer: Self-pay | Admitting: Family Medicine

## 2013-12-10 ENCOUNTER — Telehealth: Payer: Self-pay | Admitting: Family Medicine

## 2013-12-10 NOTE — Telephone Encounter (Signed)
It looks like pt has been on several other BP medicines - Benicar, Benicar HCT, Bystolic, lisinopril - is there one that she tolerated better?  Her blood pressure was quite high at her last visit, I think it is important that she is on a medication to keep this lower.    If there is a medication she has tolerated in the past, we will change back to that.  Otherwise I would recommend she come in to discuss and be evaluate since she is feeling unwell

## 2013-12-10 NOTE — Telephone Encounter (Signed)
Received fax from [employer] with message that medication, Losartan, is making her sick. She requested a new BP medication. Called patient to get details (language barrier) She stated her head feels heavy, back and neck feel heavy, she don't feel good on inside. She stopped medication yesterday and took HCTZ (old RX) today. She hasn't felt as bad today. Please advise

## 2013-12-11 NOTE — Telephone Encounter (Signed)
Left message on machine to call back  

## 2013-12-12 MED ORDER — HYDROCHLOROTHIAZIDE 25 MG PO TABS: 25.0000 mg | ORAL_TABLET | Freq: Every day | ORAL | Status: DC

## 2013-12-12 NOTE — Telephone Encounter (Signed)
Tried to call pt. Someone answered and then hung up on me. Tried again and Left message on machine to call back

## 2013-12-12 NOTE — Telephone Encounter (Signed)
Pt says that the HCT is the only med that doesn't make her feel bad (?? Big language barrier). Pt is supposed to recheck with Dr. Marin Comment in 3 weeks. Can we send in HCT 25 for her to try between now and then. It was very hard trying to talk to her. I'm sorry

## 2013-12-22 ENCOUNTER — Encounter: Payer: Self-pay | Admitting: *Deleted

## 2013-12-22 ENCOUNTER — Encounter: Payer: 59 | Attending: Family Medicine | Admitting: *Deleted

## 2013-12-22 VITALS — Ht 66.0 in | Wt 247.7 lb

## 2013-12-22 DIAGNOSIS — E119 Type 2 diabetes mellitus without complications: Secondary | ICD-10-CM | POA: Insufficient documentation

## 2013-12-22 DIAGNOSIS — Z713 Dietary counseling and surveillance: Secondary | ICD-10-CM | POA: Insufficient documentation

## 2013-12-22 NOTE — Patient Instructions (Addendum)
Plan:  Aim for 2-3 Carb Choices per meal (45 grams) +/- 1 either way  Aim for 0-15 Carbs per snack if hungry  Include protein in moderation with your meals and snacks Consider reading food labels for Total Carbohydrate and Fat Grams of foods Consider  increasing your activity level by walking for 30 minutes daily as tolerated Talk to you doctor about checking BG as directed by MD  Consider taking medication  as directed by MD  In order to decrease your glucose and lose weight: Decrease the size of your portions Eat three meals a day Consider Fit and Light yogurt (good carbohydrate & protein source) [facility] [employer]

## 2013-12-22 NOTE — Progress Notes (Signed)
Appt start time: 1330 end time:  1500.  Assessment:  Patient was seen on  12/22/13 for individual diabetes education. Lindsay Travis presents with a 2 yr diagnosis of T2DM. She is making good food choices yet needs to decrease her portions. She is also skipping meals.  Current HbA1c: 7.1%    12/02/13  Preferred Learning Style:   No preference indicated   Learning Readiness:   Change in progress  MEDICATIONS: Metformin SR 500mg  daily  DIETARY INTAKE: Only eats one true meal per day  B ( AM): eggs, tea/soy milk,   Snk ( AM): apple  L ( PM): corn, spinach, fish, water....grilled chicken, spinach, rice 2/3C, spaghetti 1 1/2 C, chicken breast, shrimp Snk ( PM): none D ( PM): tea, Snk ( PM): none Beverages: tea, water, soy milk  Usual physical activity: none  Intervention:  Nutrition counseling provided.  Discussed diabetes disease process and treatment options.  Discussed physiology of diabetes and role of obesity on insulin resistance.  Encouraged moderate weight reduction to improve glucose levels.  Discussed role of medications and diet in glucose control  Provided education on macronutrients on glucose levels.  Provided education on carb counting, importance of regularly scheduled meals/snacks, and meal planning  Discussed effects of physical activity on glucose levels and long-term glucose control.  Recommended 150 minutes of physical activity/week.  Reviewed patient medications.  Discussed role of medication on blood glucose and possible side effects  Discussed blood glucose monitoring and interpretation.  Discussed recommended target ranges and individual ranges.    Described short-term complications: hyper- and hypo-glycemia.  Discussed causes,symptoms, and treatment options.  Discussed prevention, detection, and treatment of long-term complications.  Discussed the role of prolonged elevated glucose levels on body systems.  Discussed role of stress on blood glucose levels and  discussed strategies to manage psychosocial issues.  Discussed recommendations for long-term diabetes self-care.  Established checklist for medical, dental, and emotional self-care.  Plan:  Aim for 2-3 Carb Choices per meal (45 grams) +/- 1 either way  Aim for 0-15 Carbs per snack if hungry  Include protein in moderation with your meals and snacks Consider reading food labels for Total Carbohydrate and Fat Grams of foods Consider  increasing your activity level by walking for 30 minutes daily as tolerated Talk to you doctor about checking BG as directed by MD  Consider taking medication  as directed by MD  In order to decrease your glucose and lose weight: Decrease the size of your portions Eat three meals a day Consider Fit and Light yogurt (good carbohydrate & protein source) [facility] [employer]  Teaching Method Utilized:  [employer] Hands on  Handouts given during visit include: Living Well with Diabetes Carb Counting  Meal Plan Card  Barriers to learning/adherence to lifestyle change: motivation, mobility issues.  Diabetes self-care support plan:   [facility] support group  PCP  Demonstrated degree of understanding via:  Teach Back   Monitoring/Evaluation:  Dietary intake, exercise, and body weight return for f/u  prn.

## 2013-12-31 ENCOUNTER — Encounter: Payer: Self-pay | Admitting: Family Medicine

## 2013-12-31 ENCOUNTER — Ambulatory Visit (INDEPENDENT_AMBULATORY_CARE_PROVIDER_SITE_OTHER): Payer: 59 | Admitting: Family Medicine

## 2013-12-31 VITALS — BP 145/95 | HR 68 | Temp 99.4°F | Resp 16 | Ht 65.0 in | Wt 241.8 lb

## 2013-12-31 DIAGNOSIS — E1165 Type 2 diabetes mellitus with hyperglycemia: Principal | ICD-10-CM

## 2013-12-31 DIAGNOSIS — IMO0001 Reserved for inherently not codable concepts without codable children: Secondary | ICD-10-CM

## 2013-12-31 DIAGNOSIS — I10 Essential (primary) hypertension: Secondary | ICD-10-CM

## 2013-12-31 LAB — GLUCOSE, POCT (MANUAL RESULT ENTRY): POC Glucose: 103 mg/dL — AB (ref 70–99)

## 2013-12-31 MED ORDER — LOSARTAN POTASSIUM 25 MG PO TABS: ORAL_TABLET | ORAL | Status: DC

## 2013-12-31 MED ORDER — METFORMIN HCL 500 MG PO TABS: 500.0000 mg | ORAL_TABLET | Freq: Every day | ORAL | Status: DC

## 2013-12-31 MED ORDER — HYDROCHLOROTHIAZIDE 25 MG PO TABS: 25.0000 mg | ORAL_TABLET | Freq: Every day | ORAL | Status: DC

## 2013-12-31 NOTE — Progress Notes (Signed)
Chief Complaint:  Chief Complaint  Patient presents with  . Follow-up    DIABETES    HPI: Lindsay Travis is a 53 y.o. female who is here for  Recheck HTN and DM She was sick on the Losartan , tried for 5 days and cause her to have HA, she stopped because she was havign HA but not absolutely  sure since she had sinus sxs also at that time and was being treated with amoxacillin and flonase for sinusitis. She called in and was switched to HCTZ 25 mg daily. She does not take her BP at homw, however she has not had any SEs from the HCTZ. She has tried to eat a better diet and has gone to see a nutritionist for there DM, on Metfomrin 500 mg , tolerating meds well, no SEs.   Past Medical History  Diagnosis Date  . Hypertension   . Anemia   . Shortness of breath     on exertion- has low hgb  . Diabetes mellitus     recent high blood sugar and has RX but not taking  med -b/c she doesn't think she  is diabetic, just had lots of sugar in her diet when  dx'd.  . Allergy    Past Surgical History  Procedure Laterality Date  . No past surgeries    . Abdominal hysterectomy  09/11/2011    Procedure: HYSTERECTOMY ABDOMINAL;  Surgeon: Frederico Hamman, MD;  Location: [facility];  Service: Gynecology;  Laterality: N/A;   History   Social History  . Marital Status: Divorced    Spouse Name: N/A    Number of Children: N/A  . Years of Education: N/A   Social History Main Topics  . Smoking status: Never Smoker   . Smokeless tobacco: Never Used  . Alcohol Use: No  . Drug Use: No  . Sexual Activity: No   Other Topics Concern  . None   Social History Narrative  . None   Family History  Problem Relation Age of Onset  . Hypertension Other    Allergies  Allergen Reactions  . Aspirin Other (See Comments)    Abdominal pain   Prior to Admission medications   Medication Sig Start Date End Date Taking? Authorizing Provider  hydrochlorothiazide (HYDRODIURIL) 25 MG tablet Take 1 tablet  (25 mg total) by mouth daily. 12/12/13  Yes Mancel Bale, PA-C  metFORMIN (GLUCOPHAGE) 500 MG tablet Take 1 tablet (500 mg total) by mouth daily with breakfast. 12/02/13  Yes Orian Figueira P Mattheu Brodersen, DO  amoxicillin-clavulanate (AUGMENTIN) 875-125 MG per tablet Take 1 tablet by mouth 2 (two) times daily. 12/02/13   Paulino Cork P Krystn Dermody, DO  fluticasone (FLONASE) 50 MCG/ACT nasal spray Place 2 sprays into both nostrils daily. 12/02/13   Levina Boyack P Cayman Brogden, DO  losartan (COZAAR) 25 MG tablet Take 1 tablet (25 mg total) by mouth daily. 12/02/13   Kyara Boxer P Taylore Hinde, DO     ROS: The patient denies fevers, chills, night sweats, unintentional weight loss, chest pain, palpitations, wheezing, dyspnea on exertion, nausea, vomiting, abdominal pain, dysuria, hematuria, melena, numbness, weakness, or tingling.   All other systems have been reviewed and were otherwise negative with the exception of those mentioned in the HPI and as above.    PHYSICAL EXAM: Filed Vitals:   12/31/13 1423  BP: 145/95  Pulse:   Temp:   Resp:    Filed Vitals:   12/31/13 1348  Height: 5\' 5"  (1.651 m)  Weight: 241 lb 12.8 oz (109.68 kg)   Body mass index is 40.24 kg/(m^2).  General: Alert, no acute distress, obese African female HEENT:  Normocephalic, atraumatic, oropharynx patent. EOMI, PERRLA Cardiovascular:  Regular rate and rhythm, no rubs murmurs or gallops.  No Carotid bruits, radial pulse intact. No pedal edema.  Respiratory: Clear to auscultation bilaterally.  No wheezes, rales, or rhonchi.  No cyanosis, no use of accessory musculature GI: No organomegaly, abdomen is soft and non-tender, positive bowel sounds.  No masses. Skin: No rashes. Neurologic: Facial musculature symmetric. NL micorfilament exam Psychiatric: Patient is appropriate throughout our interaction. Lymphatic: No cervical lymphadenopathy Musculoskeletal: Gait intact.   LABS: Results for orders placed in visit on 12/31/13  COMPREHENSIVE METABOLIC PANEL      Result Value Ref Range    Sodium 137  135 - 145 mEq/L   Potassium 3.6  3.5 - 5.3 mEq/L   Chloride 104  96 - 112 mEq/L   CO2 27  19 - 32 mEq/L   Glucose, Bld 84  70 - 99 mg/dL   BUN 14  6 - 23 mg/dL   Creat 0.79  0.50 - 1.10 mg/dL   Total Bilirubin 0.4  0.2 - 1.2 mg/dL   Alkaline Phosphatase 50  39 - 117 U/L   AST 14  0 - 37 U/L   ALT 12  0 - 35 U/L   Total Protein 6.8  6.0 - 8.3 g/dL   Albumin 4.2  3.5 - 5.2 g/dL   Calcium 9.1  8.4 - 10.5 mg/dL  MICROALBUMIN, URINE      Result Value Ref Range   Microalb, Ur 0.75  0.00 - 1.89 mg/dL  GLUCOSE, POCT (MANUAL RESULT ENTRY)      Result Value Ref Range   POC Glucose 103 (*) 70 - 99 mg/dl     EKG/XRAY:   Primary read interpreted by Dr. Marin Comment at [facility].   ASSESSMENT/PLAN: Encounter Diagnoses  Name Primary?  . Type II or unspecified type diabetes mellitus without mention of complication, uncontrolled Yes  . HTN (hypertension)    Will continue with HCTZ 25 mg  Consider if HTN still poorly controlled to increase to 50 mg; either HCTZ or Chlortahlidone 25 mg BID or 50 mg daily.  We will do a retrial of the Losartan since she is diabetic  and I feel she needs some more renal protection since her BP has been out of control for soemtime and she has not done anythign for her diabetes until recently since she was in denial of the dx.  Losartan 25 mg 1/2 tablet Po daily CMP, micralbumin pending She will return in 2 month for diabetes check and we will get her HbA1c Advise to get BP cuff and measure BP  F/u prn  Gross sideeffects, risk and benefits, and alternatives of medications d/w patient. Patient is aware that all medications have potential sideeffects and we are unable to predict every sideeffect or drug-drug interaction that may occur.  Glenford Bayley, DO 01/01/2014 8:55 AM

## 2013-12-31 NOTE — Patient Instructions (Signed)

## 2014-01-01 LAB — MICROALBUMIN, URINE: Microalb, Ur: 0.75 mg/dL (ref 0.00–1.89)

## 2014-01-01 LAB — COMPREHENSIVE METABOLIC PANEL
ALT: 12 U/L (ref 0–35)
AST: 14 U/L (ref 0–37)
Alkaline Phosphatase: 50 U/L (ref 39–117)
Creat: 0.79 mg/dL (ref 0.50–1.10)
Sodium: 137 mEq/L (ref 135–145)
Total Bilirubin: 0.4 mg/dL (ref 0.2–1.2)
Total Protein: 6.8 g/dL (ref 6.0–8.3)

## 2014-01-01 LAB — COMPREHENSIVE METABOLIC PANEL WITH GFR
Albumin: 4.2 g/dL (ref 3.5–5.2)
BUN: 14 mg/dL (ref 6–23)
CO2: 27 meq/L (ref 19–32)
Calcium: 9.1 mg/dL (ref 8.4–10.5)
Chloride: 104 meq/L (ref 96–112)
Glucose, Bld: 84 mg/dL (ref 70–99)
Potassium: 3.6 meq/L (ref 3.5–5.3)

## 2014-01-05 ENCOUNTER — Encounter: Payer: Self-pay | Admitting: Family Medicine

## 2014-02-06 ENCOUNTER — Other Ambulatory Visit: Payer: Self-pay | Admitting: Family Medicine

## 2014-02-23 ENCOUNTER — Other Ambulatory Visit: Payer: Self-pay

## 2014-02-23 MED ORDER — LANCETS MISC: Status: DC

## 2014-03-04 ENCOUNTER — Ambulatory Visit: Payer: 59 | Admitting: Family Medicine

## 2014-03-16 ENCOUNTER — Other Ambulatory Visit: Payer: Self-pay | Admitting: Family Medicine

## 2014-03-17 NOTE — Telephone Encounter (Signed)
Spoke to pt- advised her she needed to make an appt for further refills- missed an appt with Dr. Marin Comment 6/26 Pt said ok and abruptly ended call. Sent 30 day supply with message

## 2014-04-01 ENCOUNTER — Ambulatory Visit (INDEPENDENT_AMBULATORY_CARE_PROVIDER_SITE_OTHER): Payer: 59 | Admitting: Family Medicine

## 2014-04-01 VITALS — BP 139/100 | HR 90 | Temp 98.5°F | Resp 16 | Ht 66.0 in | Wt 248.0 lb

## 2014-04-01 DIAGNOSIS — IMO0001 Reserved for inherently not codable concepts without codable children: Secondary | ICD-10-CM

## 2014-04-01 DIAGNOSIS — I10 Essential (primary) hypertension: Secondary | ICD-10-CM

## 2014-04-01 DIAGNOSIS — E1165 Type 2 diabetes mellitus with hyperglycemia: Principal | ICD-10-CM

## 2014-04-01 DIAGNOSIS — R109 Unspecified abdominal pain: Secondary | ICD-10-CM

## 2014-04-01 LAB — POCT URINALYSIS DIPSTICK
Bilirubin, UA: NEGATIVE
Glucose, UA: NEGATIVE
Ketones, UA: NEGATIVE
Leukocytes, UA: NEGATIVE
Nitrite, UA: NEGATIVE
Protein, UA: NEGATIVE
Spec Grav, UA: 1.01
Urobilinogen, UA: 0.2
pH, UA: 5.5

## 2014-04-01 LAB — POCT UA - MICROSCOPIC ONLY
Casts, Ur, LPF, POC: NEGATIVE
Crystals, Ur, HPF, POC: NEGATIVE
Mucus, UA: NEGATIVE
WBC, Ur, HPF, POC: NEGATIVE
Yeast, UA: NEGATIVE

## 2014-04-01 LAB — POCT GLYCOSYLATED HEMOGLOBIN (HGB A1C): Hemoglobin A1C: 7.2

## 2014-04-01 MED ORDER — HYDROCHLOROTHIAZIDE 25 MG PO TABS: 25.0000 mg | ORAL_TABLET | Freq: Every day | ORAL | Status: DC

## 2014-04-01 MED ORDER — GLUCOSE BLOOD VI STRP: ORAL_STRIP | Status: DC

## 2014-04-01 MED ORDER — METFORMIN HCL 500 MG PO TABS: ORAL_TABLET | ORAL | Status: DC

## 2014-04-01 MED ORDER — OLMESARTAN MEDOXOMIL 20 MG PO TABS: 20.0000 mg | ORAL_TABLET | Freq: Every day | ORAL | Status: DC

## 2014-04-01 NOTE — Progress Notes (Signed)
Chief Complaint:  Chief Complaint  Patient presents with  . Follow-up    BP, DM    HPI: Lindsay Travis is a 53 y.o. female who is here for  DM and HTN recheck She stopped taking losartan because she was not feeling well on it. Says she gets HA. She is still on HCTZ and is only taking that.  Ms Schelling has had a tendency to take medicines that she wants, she thinks she can take care of her HTNa , DM naturally but we have been down that route and she is unable to lose weight and control her diets and exercise so she continues to have DM and HTN. She previosuly was on BYstolic and Benicar, the benicar worked well for her. This was before I met her. She states she did not have any SEs with the Benicar.  She is taking metformin daily. Denies neuopathy, is UTD on eye exam, she is not following any specific exercise regiment, she states : she does not eat much" She is not having any heart or pulmonary sxs.  NO CP, SOB, palpitations.   Wt Readings from Last 3 Encounters:  04/01/14 248 lb (112.492 kg)  12/31/13 241 lb 12.8 oz (109.68 kg)  12/22/13 247 lb 11.2 oz (112.356 kg)    Lab Results  Component Value Date   HGBA1C 7.2 04/01/2014   HGBA1C 7.1 12/02/2013   Lab Results  Component Value Date   MICROALBUR 0.75 12/31/2013   LDLCALC 91 07/24/2007   CREATININE 0.80 04/01/2014    BP Readings from Last 3 Encounters:  04/01/14 139/100  12/31/13 145/95  12/02/13 160/95     Past Medical History  Diagnosis Date  . Hypertension   . Anemia   . Shortness of breath     on exertion- has low hgb  . Diabetes mellitus     recent high blood sugar and has RX but not taking  med -b/c she doesn't think she  is diabetic, just had lots of sugar in her diet when  dx'd.  . Allergy    Past Surgical History  Procedure Laterality Date  . No past surgeries    . Abdominal hysterectomy  09/11/2011    Procedure: HYSTERECTOMY ABDOMINAL;  Surgeon: Frederico Hamman, MD;  Location: [facility];  Service:  Gynecology;  Laterality: N/A;   History   Social History  . Marital Status: Divorced    Spouse Name: N/A    Number of Children: N/A  . Years of Education: N/A   Social History Main Topics  . Smoking status: Never Smoker   . Smokeless tobacco: Never Used  . Alcohol Use: No  . Drug Use: No  . Sexual Activity: No   Other Topics Concern  . Not on file   Social History Narrative  . No narrative on file   Family History  Problem Relation Age of Onset  . Hypertension Other    Allergies  Allergen Reactions  . Aspirin Other (See Comments)    Abdominal pain   Prior to Admission medications   Medication Sig Start Date End Date Taking? Authorizing Provider  hydrochlorothiazide (HYDRODIURIL) 25 MG tablet Take 1 tablet (25 mg total) by mouth daily. 12/31/13  Yes Monchel Pollitt P Adra Shepler, DO  metFORMIN (GLUCOPHAGE) 500 MG tablet Take 1 tablet every AM PT NEEDS APPT FOR FURTHER REFILLS 03/17/14  Yes Mancel Bale, PA-C  fluticasone (FLONASE) 50 MCG/ACT nasal spray Place 2 sprays into both nostrils daily. 12/02/13  Betina Puckett P Berdia Lachman, DO  losartan (COZAAR) 25 MG tablet Take 1/2 tab PO daily 12/31/13   Tosha Belgarde P Rola Lennon, DO     ROS: The patient denies fevers, chills, night sweats, unintentional weight loss, chest pain, palpitations, wheezing, dyspnea on exertion, nausea, vomiting, abdominal pain, dysuria, hematuria, melena, numbness, weakness, or tingling.   All other systems have been reviewed and were otherwise negative with the exception of those mentioned in the HPI and as above.    PHYSICAL EXAM: Filed Vitals:   04/01/14 1121  BP: 139/100  Pulse: 90  Temp: 98.5 F (36.9 C)  Resp: 16   Filed Vitals:   04/01/14 1121  Height: _0  (1.676 m)  Weight: 248 lb (112.492 kg)   Body mass index is 40.05 kg/(m^2).  General: Alert, no acute distress HEENT:  Normocephalic, atraumatic, oropharynx patent. EOMI, PERRLA Cardiovascular:  Regular rate and rhythm, no rubs murmurs or gallops.  No Carotid bruits, radial  pulse intact. No pedal edema.  Respiratory: Clear to auscultation bilaterally.  No wheezes, rales, or rhonchi.  No cyanosis, no use of accessory musculature GI: No organomegaly, abdomen is soft and non-tender, positive bowel sounds.  No masses. Skin: No rashes. Neurologic: Facial musculature symmetric. MIcrofilament exam nl Psychiatric: Patient is appropriate throughout our interaction. Lymphatic: No cervical lymphadenopathy Musculoskeletal: Gait intact.   LABS: Results for orders placed in visit on 04/01/14  URINE CULTURE      Result Value Ref Range   Colony Count >=100,000 COLONIES/ML     Organism ID, Bacteria Multiple bacterial morphotypes present, none     Organism ID, Bacteria predominant. Suggest appropriate recollection if      Organism ID, Bacteria clinically indicated.    COMPLETE METABOLIC PANEL WITH GFR      Result Value Ref Range   Sodium 137  135 - 145 mEq/L   Potassium 3.4 (*) 3.5 - 5.3 mEq/L   Chloride 99  96 - 112 mEq/L   CO2 23  19 - 32 mEq/L   Glucose, Bld 108 (*) 70 - 99 mg/dL   BUN 13  6 - 23 mg/dL   Creat 0.80  0.50 - 1.10 mg/dL   Total Bilirubin 0.4  0.2 - 1.2 mg/dL   Alkaline Phosphatase 50  39 - 117 U/L   AST 15  0 - 37 U/L   ALT 15  0 - 35 U/L   Total Protein 6.9  6.0 - 8.3 g/dL   Albumin 4.0  3.5 - 5.2 g/dL   Calcium 9.2  8.4 - 10.5 mg/dL   GFR, Est African American >89     GFR, Est Non African American 85    POCT GLYCOSYLATED HEMOGLOBIN (HGB A1C)      Result Value Ref Range   Hemoglobin A1C 7.2    POCT UA - MICROSCOPIC ONLY      Result Value Ref Range   WBC, Ur, HPF, POC neg     RBC, urine, microscopic 2-4     Bacteria, U Microscopic trace     Mucus, UA neg     Epithelial cells, urine per micros 0-1     Crystals, Ur, HPF, POC neg     Casts, Ur, LPF, POC neg     Yeast, UA neg    POCT URINALYSIS DIPSTICK      Result Value Ref Range   Color, UA yellow     Clarity, UA clear     Glucose, UA neg     Bilirubin, UA neg  Ketones, UA neg      Spec Grav, UA 1.010     Blood, UA small     pH, UA 5.5     Protein, UA neg     Urobilinogen, UA 0.2     Nitrite, UA neg     Leukocytes, UA Negative       EKG/XRAY:   Primary read interpreted by Dr. Marin Comment at [facility].   ASSESSMENT/PLAN: Encounter Diagnoses  Name Primary?  . Type II or unspecified type diabetes mellitus without mention of complication, uncontrolled Yes  . Essential hypertension   . Flank pain    Labs pending Will call with results Rx Benicar for her to take at night C/w HCTZ in the AM Increase metformin to 500 mg BID  F/u in 3 months   Gross sideeffects, risk and benefits, and alternatives of medications d/w patient. Patient is aware that all medications have potential sideeffects and we are unable to predict every sideeffect or drug-drug interaction that may occur.  Leotis Pain, DO 04/03/2014 12:18 PM   D/w patient lab results: Hypokalemia suggest taking in cocunut water, she does not want to take more pills or a banana daily D/w patient hematuria which she has, she states she ahs no sxs. She has had hysterectomy in 2013 for benign tumors by Dr Ruthann Cancer .  She will come in in 2 weeks to get her potassium rechecked, her urine rechecked for hematuria

## 2014-04-01 NOTE — Patient Instructions (Signed)
Diabetes and Exercise Exercising regularly is important. It is not just about losing weight. It has many health benefits, such as:  Improving your overall fitness, flexibility, and endurance.  Increasing your bone density.  Helping with weight control.  Decreasing your body fat.  Increasing your muscle strength.  Reducing stress and tension.  Improving your overall health. People with diabetes who exercise gain additional benefits because exercise:  Reduces appetite.  Improves the body's use of blood sugar (glucose).  Helps lower or control blood glucose.  Decreases blood pressure.  Helps control blood lipids (such as cholesterol and triglycerides).  Improves the body's use of the hormone insulin by:  Increasing the body's insulin sensitivity.  Reducing the body's insulin needs.  Decreases the risk for heart disease because exercising:  Lowers cholesterol and triglycerides levels.  Increases the levels of good cholesterol (such as high-density lipoproteins [HDL]) in the body.  Lowers blood glucose levels. YOUR ACTIVITY PLAN  Choose an activity that you enjoy and set realistic goals. Your health care provider or diabetes educator can help you make an activity plan that works for you. Exercise regularly as directed by your health care provider. This includes:  Performing resistance training twice a week such as push-ups, sit-ups, lifting weights, or using resistance bands.  Performing 150 minutes of cardio exercises each week such as walking, running, or playing sports.  Staying active and spending no more than 90 minutes at one time being inactive. Even short bursts of exercise are good for you. Three 10-minute sessions spread throughout the day are just as beneficial as a single 30-minute session. Some exercise ideas include:  Taking the dog for a walk.  Taking the stairs instead of the elevator.  Dancing to your favorite song.  Doing an exercise  video.  Doing your favorite exercise with a friend. RECOMMENDATIONS FOR EXERCISING WITH TYPE 1 OR TYPE 2 DIABETES   Check your blood glucose before exercising. If blood glucose levels are greater than 240 mg/dL, check for urine ketones. Do not exercise if ketones are present.  Avoid injecting insulin into areas of the body that are going to be exercised. For example, avoid injecting insulin into:  The arms when playing tennis.  The legs when jogging.  Keep a record of:  Food intake before and after you exercise.  Expected peak times of insulin action.  Blood glucose levels before and after you exercise.  The type and amount of exercise you have done.  Review your records with your health care provider. Your health care provider will help you to develop guidelines for adjusting food intake and insulin amounts before and after exercising.  If you take insulin or oral hypoglycemic agents, watch for signs and symptoms of hypoglycemia. They include:  Dizziness.  Shaking.  Sweating.  Chills.  Confusion.  Drink plenty of water while you exercise to prevent dehydration or heat stroke. Body water is lost during exercise and must be replaced.  Talk to your health care provider before starting an exercise program to make sure it is safe for you. Remember, almost any type of activity is better than none. Document Released: 11/16/2003 Document Revised: 01/10/2014 Document Reviewed: 02/02/2013 ExitCare Patient Information 2015 [employer]. This information is not intended to replace advice given to you by your health care provider. Make sure you discuss any questions you have with your health care provider.  

## 2014-04-02 LAB — COMPLETE METABOLIC PANEL WITHOUT GFR
Albumin: 4 g/dL (ref 3.5–5.2)
CO2: 23 meq/L (ref 19–32)
Chloride: 99 meq/L (ref 96–112)
GFR, Est African American: >89 mL/min
GFR, Est Non African American: 85 mL/min
Glucose, Bld: 108 mg/dL — ABNORMAL HIGH (ref 70–99)

## 2014-04-02 LAB — COMPLETE METABOLIC PANEL WITH GFR
ALT: 15 U/L (ref 0–35)
AST: 15 U/L (ref 0–37)
Alkaline Phosphatase: 50 U/L (ref 39–117)
BUN: 13 mg/dL (ref 6–23)
Calcium: 9.2 mg/dL (ref 8.4–10.5)
Creat: 0.8 mg/dL (ref 0.50–1.10)
Potassium: 3.4 mEq/L — ABNORMAL LOW (ref 3.5–5.3)
Sodium: 137 mEq/L (ref 135–145)
Total Bilirubin: 0.4 mg/dL (ref 0.2–1.2)
Total Protein: 6.9 g/dL (ref 6.0–8.3)

## 2014-04-03 ENCOUNTER — Encounter: Payer: Self-pay | Admitting: Family Medicine

## 2014-04-03 LAB — URINE CULTURE: Colony Count: >=100000

## 2014-04-03 MED ORDER — METFORMIN HCL 500 MG PO TABS: 500.0000 mg | ORAL_TABLET | Freq: Two times a day (BID) | ORAL | Status: DC

## 2014-04-14 ENCOUNTER — Ambulatory Visit (INDEPENDENT_AMBULATORY_CARE_PROVIDER_SITE_OTHER): Payer: 59 | Admitting: Family Medicine

## 2014-04-14 VITALS — BP 122/82 | HR 65 | Temp 98.0°F | Resp 16 | Ht 66.0 in | Wt 251.0 lb

## 2014-04-14 DIAGNOSIS — E876 Hypokalemia: Secondary | ICD-10-CM

## 2014-04-14 DIAGNOSIS — I10 Essential (primary) hypertension: Secondary | ICD-10-CM

## 2014-04-14 DIAGNOSIS — E119 Type 2 diabetes mellitus without complications: Secondary | ICD-10-CM

## 2014-04-14 DIAGNOSIS — R319 Hematuria, unspecified: Secondary | ICD-10-CM

## 2014-04-14 LAB — POCT URINALYSIS DIPSTICK
Bilirubin, UA: NEGATIVE
Glucose, UA: NEGATIVE
Ketones, UA: NEGATIVE
Leukocytes, UA: NEGATIVE
Nitrite, UA: NEGATIVE
Protein, UA: NEGATIVE
Spec Grav, UA: 1.01
Urobilinogen, UA: 0.2
pH, UA: 7

## 2014-04-14 LAB — POCT UA - MICROSCOPIC ONLY
Casts, Ur, LPF, POC: NEGATIVE
Crystals, Ur, HPF, POC: NEGATIVE
Mucus, UA: NEGATIVE
WBC, Ur, HPF, POC: NEGATIVE
Yeast, UA: NEGATIVE

## 2014-04-14 NOTE — Progress Notes (Signed)
 Chief Complaint:  Chief Complaint  Patient presents with  . Follow-up    Dr. Marin Comment for  Diabetes    HPI: Lindsay Travis is a 53 y.o. female who is here for  Recheck of her hematuria. She is also here to recheck her blood pressure, she is taking HCTZ in the morning and also Benicar in the evening. She had some hypokamlemia so will recheck that on today's visit as well.  She states she feels tired Her Diabetes is decent, she takes her FBG and tells me that it has been below 150, hovers int he 140s. She has no hypoglycemic events  She has yet to start exercising and/or dieting. She states she feels full all the time. She also has had some muscle aches in the bilateral hipsmbut not sure if that is new or old.    Past Medical History  Diagnosis Date  . Hypertension   . Anemia   . Shortness of breath     on exertion- has low hgb  . Diabetes mellitus     recent high blood sugar and has RX but not taking  med -b/c she doesn't think she  is diabetic, just had lots of sugar in her diet when  dx'd.  . Allergy    Past Surgical History  Procedure Laterality Date  . No past surgeries    . Abdominal hysterectomy  09/11/2011    Procedure: HYSTERECTOMY ABDOMINAL;  Surgeon: Frederico Hamman, MD;  Location: [facility];  Service: Gynecology;  Laterality: N/A;   History   Social History  . Marital Status: Divorced    Spouse Name: N/A    Number of Children: N/A  . Years of Education: N/A   Social History Main Topics  . Smoking status: Never Smoker   . Smokeless tobacco: Never Used  . Alcohol Use: No  . Drug Use: No  . Sexual Activity: No   Other Topics Concern  . None   Social History Narrative  . None   Family History  Problem Relation Age of Onset  . Hypertension Other    Allergies  Allergen Reactions  . Aspirin Other (See Comments)    Abdominal pain   Prior to Admission medications   Medication Sig Start Date End Date Taking? Authorizing Provider  hydrochlorothiazide  (HYDRODIURIL) 25 MG tablet Take 1 tablet (25 mg total) by mouth daily. 04/01/14  Yes  P , DO  metFORMIN (GLUCOPHAGE) 500 MG tablet Take 1 tablet (500 mg total) by mouth 2 (two) times daily with a meal. 04/03/14  Yes  P , DO  olmesartan (BENICAR) 20 MG tablet Take 1 tablet (20 mg total) by mouth daily. 04/01/14  Yes  P , DO  glucose blood test strip Use as instructed. Patient's choice of strips 04/01/14    P , DO     ROS: The patient denies fevers, chills, night sweats, unintentional weight loss, chest pain, palpitations, wheezing, dyspnea on exertion, nausea, vomiting, abdominal pain, dysuria, hematuria, melena, numbness, weakness, or tingling.   All other systems have been reviewed and were otherwise negative with the exception of those mentioned in the HPI and as above.    PHYSICAL EXAM: Filed Vitals:   04/14/14 1622  BP: 122/82  Pulse: 65  Temp: 98 F (36.7 C)  Resp: 16   Filed Vitals:   04/14/14 1622  Height: 5\' 6"  (1.676 m)  Weight: 251 lb (113.853 kg)   Body mass index is 40.53 kg/(m^2).  General:  Alert, no acute distress, obese HEENT:  Normocephalic, atraumatic, oropharynx patent. EOMI, PERRLA Cardiovascular:  Regular rate and rhythm, no rubs murmurs or gallops.  No Carotid bruits, radial pulse intact. No pedal edema.  Respiratory: Clear to auscultation bilaterally.  No wheezes, rales, or rhonchi.  No cyanosis, no use of accessory musculature GI: No organomegaly, abdomen is soft and non-tender, positive bowel sounds.  No masses. Skin: No rashes. Neurologic: Facial musculature symmetric. Psychiatric: Patient is appropriate throughout our interaction. Lymphatic: No cervical lymphadenopathy Musculoskeletal: Gait intact.   LABS: Results for orders placed in visit on 04/14/14  POCT UA - MICROSCOPIC ONLY      Result Value Ref Range   WBC, Ur, HPF, POC neg     RBC, urine, microscopic 2-8     Bacteria, U Microscopic 2+     Mucus, UA neg     Epithelial  cells, urine per micros 0-1     Crystals, Ur, HPF, POC neg     Casts, Ur, LPF, POC neg     Yeast, UA neg    POCT URINALYSIS DIPSTICK      Result Value Ref Range   Color, UA yellow     Clarity, UA sl cloudy     Glucose, UA neg     Bilirubin, UA neg     Ketones, UA neg     Spec Grav, UA 1.010     Blood, UA moderate     pH, UA 7.0     Protein, UA neg     Urobilinogen, UA 0.2     Nitrite, UA neg     Leukocytes, UA Negative       EKG/XRAY:   Primary read interpreted by Dr. Marin Comment at [facility].   ASSESSMENT/PLAN: Encounter Diagnoses  Name Primary?  . Hematuria   . Essential hypertension Yes  . Type 2 diabetes mellitus without complication   . Hypokalemia     53 y/o [place] female who is here for recheck for HTN, hypokalemia, and also hematuria Declined sleep study , she states she is tired, she has HTN and she is overweight , has HAs but she does not snore, she is not sure she wants to go throigh with the study although we discussed s/sxs of OSA Will consider changing hctz to bystolic depending on Potassium levels, and she can take benicar and bystolic. Awaiting labs.  F/u in October unless changes are made to BP meds, if so then will need her to f/u in 1 month Refer to urology for recurrent microscopic hematuria F/u with Dr Ruthann Cancer for ob/gyn exam prn   Gross sideeffects, risk and benefits, and alternatives of medications d/w patient. Patient is aware that all medications have potential sideeffects and we are unable to predict every sideeffect or drug-drug interaction that may occur.  , [place], DO 04/14/2014 5:54 PM

## 2014-04-15 LAB — URINE CULTURE: Colony Count: >=100000

## 2014-04-15 LAB — BASIC METABOLIC PANEL WITH GFR
CO2: 27 meq/L (ref 19–32)
Calcium: 9.2 mg/dL (ref 8.4–10.5)
Chloride: 103 meq/L (ref 96–112)
Creat: 0.89 mg/dL (ref 0.50–1.10)
Sodium: 138 meq/L (ref 135–145)

## 2014-04-15 LAB — BASIC METABOLIC PANEL
BUN: 17 mg/dL (ref 6–23)
Glucose, Bld: 97 mg/dL (ref 70–99)
Potassium: 4.1 mEq/L (ref 3.5–5.3)

## 2014-04-28 ENCOUNTER — Encounter: Payer: Self-pay | Admitting: Family Medicine

## 2014-05-07 ENCOUNTER — Encounter (HOSPITAL_COMMUNITY): Payer: Self-pay | Admitting: Emergency Medicine

## 2014-05-07 ENCOUNTER — Emergency Department (HOSPITAL_COMMUNITY)
Admission: EM | Admit: 2014-05-07 | Discharge: 2014-05-08 | Disposition: A | Discharge status: Home / Self Care | Payer: 59 | Attending: Emergency Medicine | Admitting: Emergency Medicine

## 2014-05-07 DIAGNOSIS — Z79899 Other long term (current) drug therapy: Secondary | ICD-10-CM | POA: Diagnosis not present

## 2014-05-07 DIAGNOSIS — I10 Essential (primary) hypertension: Secondary | ICD-10-CM | POA: Insufficient documentation

## 2014-05-07 DIAGNOSIS — Z862 Personal history of diseases of the blood and blood-forming organs and certain disorders involving the immune mechanism: Secondary | ICD-10-CM | POA: Diagnosis not present

## 2014-05-07 DIAGNOSIS — F411 Generalized anxiety disorder: Secondary | ICD-10-CM | POA: Diagnosis not present

## 2014-05-07 DIAGNOSIS — Y9389 Activity, other specified: Secondary | ICD-10-CM | POA: Insufficient documentation

## 2014-05-07 DIAGNOSIS — S0993XA Unspecified injury of face, initial encounter: Secondary | ICD-10-CM | POA: Insufficient documentation

## 2014-05-07 DIAGNOSIS — IMO0002 Reserved for concepts with insufficient information to code with codable children: Secondary | ICD-10-CM | POA: Insufficient documentation

## 2014-05-07 DIAGNOSIS — E119 Type 2 diabetes mellitus without complications: Secondary | ICD-10-CM | POA: Insufficient documentation

## 2014-05-07 DIAGNOSIS — S139XXA Sprain of joints and ligaments of unspecified parts of neck, initial encounter: Secondary | ICD-10-CM | POA: Insufficient documentation

## 2014-05-07 DIAGNOSIS — S199XXA Unspecified injury of neck, initial encounter: Secondary | ICD-10-CM

## 2014-05-07 DIAGNOSIS — Y92410 Unspecified street and highway as the place of occurrence of the external cause: Secondary | ICD-10-CM | POA: Insufficient documentation

## 2014-05-07 DIAGNOSIS — S161XXA Strain of muscle, fascia and tendon at neck level, initial encounter: Secondary | ICD-10-CM

## 2014-05-07 MED ORDER — HYDROCODONE-ACETAMINOPHEN 5-325 MG PO TABS
2.0000 | ORAL_TABLET | Freq: Once | ORAL | Status: AC
  Administered 2014-05-08: 2 via ORAL
  Filled 2014-05-07: qty 2
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
## 2014-05-08 ENCOUNTER — Emergency Department (HOSPITAL_COMMUNITY): Payer: 59

## 2014-05-08 MED ORDER — HYDROCODONE-ACETAMINOPHEN 5-325 MG PO TABS: 1.0000 | ORAL_TABLET | Freq: Four times a day (QID) | ORAL | Status: DC | PRN
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
## 2014-05-08 NOTE — ED Provider Notes (Signed)
Medical screening examination/treatment/procedure(s) were performed by non-physician practitioner and as supervising physician I was immediately available for consultation/collaboration.   EKG Interpretation None        Wynetta Fines, MD 05/08/14 [phone]

## 2014-05-17 ENCOUNTER — Ambulatory Visit (INDEPENDENT_AMBULATORY_CARE_PROVIDER_SITE_OTHER): Payer: 59 | Admitting: Internal Medicine

## 2014-05-17 VITALS — BP 140/90 | HR 64 | Temp 97.7°F | Resp 16 | Ht 66.0 in | Wt 242.8 lb

## 2014-05-17 DIAGNOSIS — H1130 Conjunctival hemorrhage, unspecified eye: Secondary | ICD-10-CM

## 2014-05-17 DIAGNOSIS — S161XXA Strain of muscle, fascia and tendon at neck level, initial encounter: Secondary | ICD-10-CM

## 2014-05-17 DIAGNOSIS — H1132 Conjunctival hemorrhage, left eye: Secondary | ICD-10-CM

## 2014-05-17 DIAGNOSIS — R51 Headache: Secondary | ICD-10-CM

## 2014-05-17 DIAGNOSIS — S139XXA Sprain of joints and ligaments of unspecified parts of neck, initial encounter: Secondary | ICD-10-CM

## 2014-05-17 MED ORDER — METHOCARBAMOL 750 MG PO TABS: 750.0000 mg | ORAL_TABLET | Freq: Four times a day (QID) | ORAL | Status: DC
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
## 2014-05-17 NOTE — Patient Instructions (Signed)
Subconjunctival Hemorrhage °A subconjunctival hemorrhage is a bright red patch covering a portion of the white of the eye. The white part of the eye is called the sclera, and it is covered by a thin membrane called the conjunctiva. This membrane is clear, except for tiny blood vessels that you can see with the naked eye. When your eye is irritated or inflamed and becomes red, it is because the vessels in the conjunctiva are swollen. °Sometimes, a blood vessel in the conjunctiva can break and bleed. When this occurs, the blood builds up between the conjunctiva and the sclera, and spreads out to create a red area. The red spot may be very small at first. It may then spread to cover a larger part of the surface of the eye, or even all of the visible white part of the eye. °In almost all cases, the blood will go away and the eye will become white again. Before completely dissolving, however, the red area may spread. It may also become brownish-yellow in color before going away. If a lot of blood collects under the conjunctiva, it may look like a bulge on the surface of the eye. This looks scary, but it will also eventually flatten out and go away. Subconjunctival hemorrhages do not cause pain, but if swollen, may cause a feeling of irritation. There is no effect on vision.  °CAUSES  °· The most common cause is mild trauma (rubbing the eye, irritation). °· Subconjunctival hemorrhages can happen because of coughing or straining (lifting heavy objects), vomiting, or sneezing. °· In some cases, your doctor may want to check your blood pressure. High blood pressure can also cause a subconjunctival hemorrhage. °· Severe trauma or blunt injuries. °· Diseases that affect blood clotting (hemophilia, leukemia). °· Abnormalities of blood vessels behind the eye (carotid cavernous sinus fistula). °· Tumors behind the eye. °· Certain drugs (aspirin, Coumadin, heparin). °· Recent eye surgery. °HOME CARE INSTRUCTIONS  °· Do not worry  about the appearance of your eye. You may continue your usual activities. °· Often, follow-up is not necessary. °SEEK MEDICAL CARE IF:  °· Your eye becomes painful. °· The bleeding does not disappear within 3 weeks. °· Bleeding occurs elsewhere, for example, under the skin, in the mouth, or in the other eye. °· You have recurring subconjunctival hemorrhages. °SEEK IMMEDIATE MEDICAL CARE IF:  °· Your vision changes or you have difficulty seeing. °· You develop a severe headache, persistent vomiting, confusion, or abnormal drowsiness (lethargy). °· Your eye seems to bulge or protrude from the eye socket. °· You notice the sudden appearance of bruises or have spontaneous bleeding elsewhere on your body. °Document Released: 08/26/2005 Document Revised: 01/10/2014 Document Reviewed: 07/24/2009 °[employer]® Patient Information ©2015 [employer]. This information is not intended to replace advice given to you by your health care provider. Make sure you discuss any questions you have with your health care provider. ° °

## 2014-07-01 ENCOUNTER — Ambulatory Visit: Payer: 59 | Admitting: Family Medicine

## 2014-07-15 ENCOUNTER — Encounter: Payer: Self-pay | Admitting: Family Medicine

## 2014-07-15 ENCOUNTER — Ambulatory Visit (INDEPENDENT_AMBULATORY_CARE_PROVIDER_SITE_OTHER): Payer: 59 | Admitting: Family Medicine

## 2014-07-15 VITALS — BP 160/100 | HR 65 | Temp 98.3°F | Resp 16 | Ht 65.75 in | Wt 236.8 lb

## 2014-07-15 DIAGNOSIS — I10 Essential (primary) hypertension: Secondary | ICD-10-CM

## 2014-07-15 DIAGNOSIS — Z13220 Encounter for screening for lipoid disorders: Secondary | ICD-10-CM

## 2014-07-15 DIAGNOSIS — E119 Type 2 diabetes mellitus without complications: Secondary | ICD-10-CM

## 2014-07-15 DIAGNOSIS — Z2821 Immunization not carried out because of patient refusal: Secondary | ICD-10-CM

## 2014-07-15 DIAGNOSIS — R350 Frequency of micturition: Secondary | ICD-10-CM

## 2014-07-15 LAB — POCT URINALYSIS DIPSTICK
Bilirubin, UA: NEGATIVE
Glucose, UA: NEGATIVE
Ketones, UA: NEGATIVE
Leukocytes, UA: NEGATIVE
Nitrite, UA: NEGATIVE
Protein, UA: NEGATIVE
Spec Grav, UA: <=1.005
Urobilinogen, UA: 0.2
pH, UA: 6

## 2014-07-15 LAB — POCT GLYCOSYLATED HEMOGLOBIN (HGB A1C): Hemoglobin A1C: 6.6

## 2014-07-15 LAB — POCT UA - MICROSCOPIC ONLY
Casts, Ur, LPF, POC: NEGATIVE
Crystals, Ur, HPF, POC: NEGATIVE
Epithelial cells, urine per micros: NEGATIVE
Mucus, UA: NEGATIVE
Yeast, UA: NEGATIVE

## 2014-07-15 MED ORDER — OLMESARTAN MEDOXOMIL 20 MG PO TABS: 20.0000 mg | ORAL_TABLET | Freq: Every day | ORAL | Status: DC

## 2014-07-15 MED ORDER — HYDROCHLOROTHIAZIDE 25 MG PO TABS: 25.0000 mg | ORAL_TABLET | Freq: Every day | ORAL | Status: DC

## 2014-07-15 MED ORDER — METFORMIN HCL 500 MG PO TABS: 500.0000 mg | ORAL_TABLET | Freq: Two times a day (BID) | ORAL | Status: DC

## 2014-07-15 NOTE — Progress Notes (Signed)
 Chief Complaint:  Chief Complaint  Patient presents with  . Follow-up    blood pressure/diabetes    HPI: Lindsay Travis is a 53 y.o. female who is here for:  1. Hematuria in  05/2014 saw Dr Risa Grill for [facility] protocol with cystoscopy and per patient did not have anything significant 2. HTN-noncompliant, takes HCTZ when seh feels  She has a headache comin gon , she stopped taking her benicar because she was afraid it may hurt her kidneys. No HA, no CP, dizziness, SOB, palpitations. She also states the Benicar was too expensive, 80 dollars per 90 days. She could not afford that 3. DM- she is utd on her eye exam, no neuropathy, She is taking  Metformin 500 mg BID and trying to lose weight  4. Declines flu vaccine  BP Readings from Last 3 Encounters:  07/15/14 160/100  05/17/14 140/90  05/08/14 163/92   Lab Results  Component Value Date   HGBA1C 7.2 04/01/2014   HGBA1C 7.1 12/02/2013   Lab Results  Component Value Date   MICROALBUR 0.75 12/31/2013   LDLCALC 91 07/24/2007   CREATININE 0.89 04/14/2014     Past Medical History  Diagnosis Date  . Hypertension   . Anemia   . Shortness of breath     on exertion- has low hgb  . Diabetes mellitus     recent high blood sugar and has RX but not taking  med -b/c she doesn't think she  is diabetic, just had lots of sugar in her diet when  dx'd.  . Allergy    Past Surgical History  Procedure Laterality Date  . No past surgeries    . Abdominal hysterectomy  09/11/2011    Procedure: HYSTERECTOMY ABDOMINAL;  Surgeon: Frederico Hamman, MD;  Location: [facility];  Service: Gynecology;  Laterality: N/A;   History   Social History  . Marital Status: Divorced    Spouse Name: N/A    Number of Children: N/A  . Years of Education: N/A   Social History Main Topics  . Smoking status: Never Smoker   . Smokeless tobacco: Never Used  . Alcohol Use: No  . Drug Use: No  . Sexual Activity: No   Other Topics Concern  . None    Social History Narrative   Family History  Problem Relation Age of Onset  . Hypertension Other    Allergies  Allergen Reactions  . Aspirin Other (See Comments)    Abdominal pain   Prior to Admission medications   Medication Sig Start Date End Date Taking? Authorizing Provider  hydrochlorothiazide (HYDRODIURIL) 25 MG tablet Take 1 tablet (25 mg total) by mouth daily. 04/01/14  Yes  P , DO  metFORMIN (GLUCOPHAGE) 500 MG tablet Take 500 mg by mouth 2 (two) times daily with a meal.   Yes Historical Provider, MD  methocarbamol (ROBAXIN-750) 750 MG tablet Take 1 tablet (750 mg total) by mouth 4 (four) times daily. 05/17/14  Yes Orma Flaming, MD  HYDROcodone-acetaminophen (NORCO/VICODIN) 5-325 MG per tablet Take 1-2 tablets by mouth every 6 (six) hours as needed for moderate pain or severe pain. 05/08/14   Montine Circle, PA-C  olmesartan (BENICAR) 20 MG tablet Take 20 mg by mouth at bedtime.    Historical Provider, MD     ROS: The patient denies fevers, chills, night sweats, unintentional weight loss, chest pain, palpitations, wheezing, dyspnea on exertion, nausea, vomiting, abdominal pain, dysuria, hematuria, melena, numbness, weakness, or tingling.  All other systems have been reviewed and were otherwise negative with the exception of those mentioned in the HPI and as above.    PHYSICAL EXAM: Filed Vitals:   07/15/14 1053  BP: 160/100  Pulse: 65  Temp: 98.3 F (36.8 C)  Resp: 16   Filed Vitals:   07/15/14 1053  Height: 5' 5.75" (1.67 m)  Weight: 236 lb 12.8 oz (107.412 kg)   Body mass index is 38.51 kg/(m^2).  General: Alert, no acute distress HEENT:  Normocephalic, atraumatic, oropharynx patent. EOMI, PERRLA. Fundo exam grossly nl Cardiovascular:  Regular rate and rhythm, no rubs murmurs or gallops.  No Carotid bruits, radial pulse intact. No pedal edema.  Respiratory: Clear to auscultation bilaterally.  No wheezes, rales, or rhonchi.  No cyanosis, no use of  accessory musculature GI: No organomegaly, abdomen is soft and non-tender, positive bowel sounds.  No masses. Skin: No rashes. Neurologic: Facial musculature symmetric. Psychiatric: Patient is appropriate throughout our interaction. Lymphatic: No cervical lymphadenopathy Musculoskeletal: Gait intact.   LABS: Results for orders placed or performed in visit on 04/14/14  Urine culture  Result Value Ref Range   Colony Count >=100,000 COLONIES/ML    Organism ID, Bacteria Multiple bacterial morphotypes present, none    Organism ID, Bacteria predominant. Suggest appropriate recollection if     Organism ID, Bacteria clinically indicated.   Basic metabolic panel  Result Value Ref Range   Sodium 138 135 - 145 mEq/L   Potassium 4.1 3.5 - 5.3 mEq/L   Chloride 103 96 - 112 mEq/L   CO2 27 19 - 32 mEq/L   Glucose, Bld 97 70 - 99 mg/dL   BUN 17 6 - 23 mg/dL   Creat 0.89 0.50 - 1.10 mg/dL   Calcium 9.2 8.4 - 10.5 mg/dL  POCT UA - Microscopic Only  Result Value Ref Range   WBC, Ur, HPF, POC neg    RBC, urine, microscopic 2-8    Bacteria, U Microscopic 2+    Mucus, UA neg    Epithelial cells, urine per micros 0-1    Crystals, Ur, HPF, POC neg    Casts, Ur, LPF, POC neg    Yeast, UA neg   POCT urinalysis dipstick  Result Value Ref Range   Color, UA yellow    Clarity, UA sl cloudy    Glucose, UA neg    Bilirubin, UA neg    Ketones, UA neg    Spec Grav, UA 1.010    Blood, UA moderate    pH, UA 7.0    Protein, UA neg    Urobilinogen, UA 0.2    Nitrite, UA neg    Leukocytes, UA Negative      EKG/XRAY:   Primary read interpreted by Dr. Marin Comment at [facility].   ASSESSMENT/PLAN: Encounter Diagnoses  Name Primary?  . Essential hypertension Yes  . Type 2 diabetes mellitus without complication   . Screening for hyperlipidemia   . Increased frequency of urination   . Influenza vaccination declined    DM-cont with metformin 500 mg  BID, refill meds HTN-will cont with HCTZ nad also restart  her on Benicar, should be no copay for her since 30 day supply, i called pharmacy She may benefit from a sleep study but I have talked toher about this and she does not want to do it.  F/u in 3 days to recheck HTN   Gross sideeffects, risk and benefits, and alternatives of medications d/w patient. Patient is aware that all medications have  potential sideeffects and we are unable to predict every sideeffect or drug-drug interaction that may occur.  , Hope, DO 07/15/2014 12:24 PM

## 2014-07-16 LAB — COMPREHENSIVE METABOLIC PANEL
AST: 13 U/L (ref 0–37)
Albumin: 3.9 g/dL (ref 3.5–5.2)
Alkaline Phosphatase: 43 U/L (ref 39–117)
BUN: 8 mg/dL (ref 6–23)
CO2: 28 mEq/L (ref 19–32)
Calcium: 9.3 mg/dL (ref 8.4–10.5)
Chloride: 102 mEq/L (ref 96–112)
Creat: 0.74 mg/dL (ref 0.50–1.10)
Glucose, Bld: 101 mg/dL — ABNORMAL HIGH (ref 70–99)
Potassium: 3.6 mEq/L (ref 3.5–5.3)
Sodium: 137 mEq/L (ref 135–145)
Total Protein: 6.8 g/dL (ref 6.0–8.3)

## 2014-07-16 LAB — COMPREHENSIVE METABOLIC PANEL WITH GFR
ALT: 13 U/L (ref 0–35)
Total Bilirubin: 0.4 mg/dL (ref 0.2–1.2)

## 2014-07-16 LAB — LIPID PANEL
Cholesterol: 171 mg/dL (ref 0–200)
HDL: 53 mg/dL (ref >39)
LDL Cholesterol: 107 mg/dL — ABNORMAL HIGH (ref 0–99)
Total CHOL/HDL Ratio: 3.2 ratio
Triglycerides: 54 mg/dL (ref <150)
VLDL: 11 mg/dL (ref 0–40)

## 2014-08-12 ENCOUNTER — Encounter: Payer: Self-pay | Admitting: Family Medicine

## 2014-08-26 ENCOUNTER — Ambulatory Visit: Payer: 59 | Admitting: Family Medicine

## 2014-09-29 ENCOUNTER — Telehealth: Payer: Self-pay | Admitting: *Deleted

## 2014-09-29 NOTE — Telephone Encounter (Signed)
Phoned and cancelled tomorrow's OV with patient and attempted to advise her of walk-in versus rescheduling options when call was disconnected.

## 2014-09-30 ENCOUNTER — Ambulatory Visit: Payer: 59 | Admitting: Family Medicine

## 2014-11-28 ENCOUNTER — Encounter: Payer: Self-pay | Admitting: Physician Assistant

## 2014-11-28 ENCOUNTER — Ambulatory Visit (INDEPENDENT_AMBULATORY_CARE_PROVIDER_SITE_OTHER): Payer: BLUE CROSS/BLUE SHIELD | Admitting: Physician Assistant

## 2014-11-28 VITALS — BP 150/90 | HR 71 | Temp 97.6°F | Resp 16 | Ht 65.5 in | Wt 234.0 lb

## 2014-11-28 DIAGNOSIS — R42 Dizziness and giddiness: Secondary | ICD-10-CM | POA: Diagnosis not present

## 2014-11-28 DIAGNOSIS — R0600 Dyspnea, unspecified: Secondary | ICD-10-CM | POA: Diagnosis not present

## 2014-11-28 DIAGNOSIS — R61 Generalized hyperhidrosis: Secondary | ICD-10-CM | POA: Diagnosis not present

## 2014-11-28 DIAGNOSIS — R110 Nausea: Secondary | ICD-10-CM

## 2014-11-28 LAB — POCT CBC
Granulocyte percent: 52 %G (ref 37–80)
HCT, POC: 42.5 % (ref 37.7–47.9)
Hemoglobin: 13.9 g/dL (ref 12.2–16.2)
LYMPH, POC: 2.1 (ref 0.6–3.4)
MCH: 28.2 pg (ref 27–31.2)
MCHC: 32.6 g/dL (ref 31.8–35.4)
MCV: 86.6 fL (ref 80–97)
MID (CBC): 0.2 (ref 0–0.9)
MPV: 8.8 fL (ref 0–99.8)
PLATELET COUNT, POC: 208 10*3/uL (ref 142–424)
POC Granulocyte: 2.4 (ref 2–6.9)
POC LYMPH %: 44 % (ref 10–50)
POC MID %: 4 % (ref 0–12)
RBC: 4.91 M/uL (ref 4.04–5.48)
RDW, POC: 13.9 %
WBC: 4.7 10*3/uL (ref 4.6–10.2)

## 2014-11-28 LAB — GLUCOSE, POCT (MANUAL RESULT ENTRY): POC Glucose: 121 mg/dl — AB (ref 70–99)

## 2014-11-28 LAB — POCT GLYCOSYLATED HEMOGLOBIN (HGB A1C): Hemoglobin A1C: 6.9

## 2014-11-28 NOTE — Progress Notes (Signed)
Feels like Weak,   [facility] [address], [place] [address] [phone]  Date:  11/28/2014   Name:  Lindsay Travis   DOB:  02/19/1961   MRN:  [id number]  PCP:  Philis Fendt, MD    Chief Complaint: dizziness, dyspnea, nausea   History of Present Illness:  Lindsay Travis is a 54 y.o. very pleasant female patient who presents with the following:  Pateint reports that last weekend she was moving around the house, and had one episode of dizziness, nausea, overheated, and trouble catching her breath.  This slowly resolved in about 5 minutes with rest.  She checked her blood sugar, which was around 150.   Throughout the day the symptoms wore on this way.  The symptoms dissipated by Monday and she was able to get through the week without symptoms, but yesterday the dizziness, over-heated feeling, and dyspnea returned lasting for minutes and resolving with rest.  There was no nausea with this last episode.  She does not report diaphoresis, chest pains, blurriness or vision changes, or palpitations during these events.  The symptoms have resolved but she feels fatigued now.  She has never had this happen before.  She has no hx of cardiac events.  She takes her anti-hypertensives regularly according to her.  She states she has had a total hysterectomy.    Patient Active Problem List   Diagnosis Date Noted  . Bilateral knee pain 05/26/2012  . Varicose veins of lower extremities with other complications 99/83/3825  . Fibroid, uterus 04/18/2011  . Metrorrhagia 04/18/2011  . Hypertension 04/18/2011    Past Medical History  Diagnosis Date  . Hypertension   . Anemia   . Shortness of breath     on exertion- has low hgb  . Diabetes mellitus     recent high blood sugar and has RX but not taking  med -b/c she doesn't think she  is diabetic, just had lots of sugar in her diet when  dx'd.  . Allergy     Past Surgical History  Procedure Laterality Date  . No past  surgeries    . Abdominal hysterectomy  09/11/2011    Procedure: HYSTERECTOMY ABDOMINAL;  Surgeon: Frederico Hamman, MD;  Location: [facility];  Service: Gynecology;  Laterality: N/A;    History  Substance Use Topics  . Smoking status: Never Smoker   . Smokeless tobacco: Never Used  . Alcohol Use: No    Family History  Problem Relation Age of Onset  . Hypertension Other     Allergies  Allergen Reactions  . Aspirin Other (See Comments)    Abdominal pain    Medication list has been reviewed and updated.  Current Outpatient Prescriptions on File Prior to Visit  Medication Sig Dispense Refill  . hydrochlorothiazide (HYDRODIURIL) 25 MG tablet Take 1 tablet (25 mg total) by mouth daily with breakfast. 30 tablet 11  . metFORMIN (GLUCOPHAGE) 500 MG tablet Take 1 tablet (500 mg total) by mouth 2 (two) times daily with a meal. 60 tablet 5  . olmesartan (BENICAR) 20 MG tablet Take 1 tablet (20 mg total) by mouth at bedtime. 30 tablet 11  . methocarbamol (ROBAXIN-750) 750 MG tablet Take 1 tablet (750 mg total) by mouth 4 (four) times daily. (Patient not taking: Reported on 11/28/2014) 40 tablet 1   No current facility-administered medications on file prior to visit.    Review of Systems  Constitutional: Positive for malaise/fatigue. Negative for fever, chills  and diaphoresis.  HENT: Negative for congestion, ear pain and hearing loss.   Eyes: Negative for blurred vision and pain.  Respiratory: Positive for shortness of breath. Negative for cough.   Cardiovascular: Negative for chest pain, palpitations and leg swelling.  Gastrointestinal: Positive for nausea. Negative for vomiting, abdominal pain, diarrhea and blood in stool.  Genitourinary: Negative for dysuria, frequency and hematuria.  Neurological: Positive for dizziness. Negative for tremors and headaches.     Physical Examination: Filed Vitals:   11/28/14 1348  BP: 168/109  Pulse: 71  Temp: 97.6 F (36.4 C)  Resp: 16   Filed  Vitals:   11/28/14 1348  Height: 5' 5.5" (1.664 m)  Weight: 234 lb (106.142 kg)   Body mass index is 38.33 kg/(m^2). Ideal Body Weight: Weight in (lb) to have BMI = 25: 152.2  Physical Exam  Constitutional: She is oriented to person, place, and time. She appears well-developed and well-nourished. No distress.  HENT:  Head: Normocephalic and atraumatic.  Nose: Nose normal.  Mouth/Throat: Oropharynx is clear and moist.  Eyes: Conjunctivae and EOM are normal. Pupils are equal, round, and reactive to light. Right eye exhibits no discharge. Left eye exhibits no discharge. Right eye exhibits normal extraocular motion and no nystagmus. Left eye exhibits normal extraocular motion and no nystagmus.  Neck: Normal range of motion. Neck supple. No thyromegaly present.  Cardiovascular: Normal rate, regular rhythm, normal heart sounds and intact distal pulses.  Exam reveals no gallop, no distant heart sounds and no friction rub.   No murmur heard. Pulses:      Carotid pulses are 2+ on the right side, and 2+ on the left side.      Radial pulses are 2+ on the right side, and 2+ on the left side.       Dorsalis pedis pulses are 2+ on the right side, and 2+ on the left side.       Posterior tibial pulses are 2+ on the right side, and 2+ on the left side.  Pulmonary/Chest: Effort normal and breath sounds normal. No respiratory distress. She has no decreased breath sounds. She has no wheezes. She has no rhonchi.  Lymphadenopathy:    She has no cervical adenopathy.  Neurological: She is alert and oriented to person, place, and time. No cranial nerve deficit. Coordination and gait normal.  Skin: Skin is warm and dry. She is not diaphoretic.  Psychiatric: She has a normal mood and affect. Her behavior is normal.    Results for orders placed or performed in visit on 11/28/14  POCT CBC  Result Value Ref Range   WBC 4.7 4.6 - 10.2 K/uL   Lymph, poc 2.1 0.6 - 3.4   POC LYMPH PERCENT 44.0 10 - 50 %L   MID  (cbc) 0.2 0 - 0.9   POC MID % 4.0 0 - 12 %M   POC Granulocyte 2.4 2 - 6.9   Granulocyte percent 52.0 37 - 80 %G   RBC 4.91 4.04 - 5.48 M/uL   Hemoglobin 13.9 12.2 - 16.2 g/dL   HCT, POC 42.5 37.7 - 47.9 %   MCV 86.6 80 - 97 fL   MCH, POC 28.2 27 - 31.2 pg   MCHC 32.6 31.8 - 35.4 g/dL   RDW, POC 13.9 %   Platelet Count, POC 208 142 - 424 K/uL   MPV 8.8 0 - 99.8 fL  POCT glycosylated hemoglobin (Hb A1C)  Result Value Ref Range   Hemoglobin A1C 6.9  POCT glucose (manual entry)  Result Value Ref Range   POC Glucose 121 (A) 70 - 99 mg/dl     Assessment and Plan: 54 year old female is here today with chief complaint of 2 episodes of nausea, sob, dizziness, and dyspnea.  Discussed patient and EKG reviewed by Dr. Tamala Julian of Cardiology [facility]. He notes hypokalemia more suspicious than acute cardiac event.  Advised to pend amb cardiology referral until results of cmet return with potassium.  If potassium is low, address this.  If not, continue with cardiology referral.  Patient desired medication for hot flashing.  Advised patient to wait pending results.  Continue anti-hypertensives until labs can be reviewed.  No present nausea or dizziness at this time, and she did not want medication to address that at this time.  This could be menopausal symptoms, though she has never had these symptoms--she is of age.  No sign of infection noted.    Diaphoresis - Plan: COMPLETE METABOLIC PANEL WITH GFR, TSH, POCT CBC, EKG 12-Lead, POCT glycosylated hemoglobin (Hb A1C), POCT glucose (manual entry)  Dyspnea - Plan: COMPLETE METABOLIC PANEL WITH GFR, TSH, POCT CBC, EKG 12-Lead  Nausea without vomiting  Dizziness  Ivar Drape, PA-C [facility] 3/21/20163:51 PM

## 2014-11-28 NOTE — Patient Instructions (Addendum)
Please continue to take medication.  I will have your lab results within the next day.  I have contacted the cardiologist to seek, and depending on the results of your cment, I will schedule a referral to cardiology.

## 2014-11-29 ENCOUNTER — Other Ambulatory Visit: Payer: Self-pay | Admitting: Physician Assistant

## 2014-11-29 DIAGNOSIS — R42 Dizziness and giddiness: Secondary | ICD-10-CM

## 2014-11-29 LAB — TSH: TSH: 1.708 u[IU]/mL (ref 0.350–4.500)

## 2014-11-29 LAB — COMPLETE METABOLIC PANEL WITH GFR
ALT: 15 U/L (ref 0–35)
AST: 16 U/L (ref 0–37)
Albumin: 4.6 g/dL (ref 3.5–5.2)
Alkaline Phosphatase: 57 U/L (ref 39–117)
BUN: 14 mg/dL (ref 6–23)
CHLORIDE: 99 meq/L (ref 96–112)
CO2: 32 mEq/L (ref 19–32)
Calcium: 9.8 mg/dL (ref 8.4–10.5)
Creat: 0.76 mg/dL (ref 0.50–1.10)
GFR, Est African American: >89 mL/min
Glucose, Bld: 103 mg/dL — ABNORMAL HIGH (ref 70–99)
Potassium: 3.8 mEq/L (ref 3.5–5.3)
SODIUM: 138 meq/L (ref 135–145)
Total Bilirubin: 0.4 mg/dL (ref 0.2–1.2)
Total Protein: 7.4 g/dL (ref 6.0–8.3)

## 2014-12-23 NOTE — Progress Notes (Signed)
Patient ID: Lindsay Travis, female   DOB: 04-16-1961, 54 y.o.   MRN: [id number]     Cardiology Office Note   Date:  12/23/2014   ID:  Lindsay Travis, DOB 02-Sep-1961, MRN [id number]  PCP:  Philis Fendt, MD  Cardiologist:   Jenkins Rouge, MD   No chief complaint on file.     History of Present Illness: Lindsay Travis is a 54 y.o. female from [place]  who presents for f/u after being seen at urgent care 3/21  with dizzyness, diaphoresis , dyspnea and nause  Pateint reports 319  that she was moving around the house, and had one episode of dizziness, nausea, overheated, and trouble catching her breath. This slowly resolved in about 5 minutes with rest. She checked her blood sugar, which was around 150. Throughout the day the symptoms wore on this way. The symptoms dissipated by Monday and she was able to get through the week without symptoms, but yesterday the dizziness, over-heated feeling, and dyspnea returned lasting for minutes and resolving with rest. There was no nausea with this last episode. She does not report diaphoresis, chest pains, blurriness or vision changes, or palpitations during these events. The symptoms have resolved but she feels fatigued now. She has never had this happen before. She has no hx of cardiac events. She takes her anti-hypertensives regularly according to her. She states she has had a total hysterectomy.  Dyspnea worse on going up stairs.  No bad dizzyness in 3 weeks.  Not associated with palpitations.  With dyspnea chest gets tight but no wheezing/asthma   TSH and blood counts ok except for BS 101 and LDL 107  No CXR or enzymes done     Past Medical History  Diagnosis Date  . Hypertension   . Anemia   . Shortness of breath     on exertion- has low hgb  . Diabetes mellitus     recent high blood sugar and has RX but not taking  med -b/c she doesn't think she  is diabetic, just had lots of sugar in her diet when  dx'd.  . Allergy     Past  Surgical History  Procedure Laterality Date  . No past surgeries    . Abdominal hysterectomy  09/11/2011    Procedure: HYSTERECTOMY ABDOMINAL;  Surgeon: Frederico Hamman, MD;  Location: [facility];  Service: Gynecology;  Laterality: N/A;     Current Outpatient Prescriptions  Medication Sig Dispense Refill  . hydrochlorothiazide (HYDRODIURIL) 25 MG tablet Take 1 tablet (25 mg total) by mouth daily with breakfast. 30 tablet 11  . metFORMIN (GLUCOPHAGE) 500 MG tablet Take 1 tablet (500 mg total) by mouth 2 (two) times daily with a meal. 60 tablet 5  . methocarbamol (ROBAXIN-750) 750 MG tablet Take 1 tablet (750 mg total) by mouth 4 (four) times daily. (Patient not taking: Reported on 11/28/2014) 40 tablet 1  . olmesartan (BENICAR) 20 MG tablet Take 1 tablet (20 mg total) by mouth at bedtime. 30 tablet 11   No current facility-administered medications for this visit.    Allergies:   Aspirin    Social History:  The patient  reports that she has never smoked. She has never used smokeless tobacco. She reports that she does not drink alcohol or use illicit drugs.   Family History:  The patient's family history includes Hypertension in her other.    ROS:  Please see the history of present illness.   Otherwise, review of systems are positive for  none.   All other systems are reviewed and negative.    PHYSICAL EXAM: VS:  LMP 08/13/2011 , BMI There is no weight on file to calculate BMI. Overweight black female  HEENT: normal Neck: no JVD, carotid bruits, or masses Cardiac:  RRR; no murmurs, rubs, or gallops,no edema  Respiratory:  clear to auscultation bilaterally, normal work of breathing GI: soft, nontender, nondistended, + BS MS: no deformity or atrophy Skin: warm and dry, no rash Neuro:  Strength and sensation are intact Psych: euthymic mood, full affect  Plus one bilateral edema    EKG:  3/21  SR rate 62 LVH in limb leads nonspecific ST changes    Recent Labs: 11/28/2014: ALT 15;  BUN 14; Creatinine 0.76; Hemoglobin 13.9; Potassium 3.8; Sodium 138; TSH 1.708    Lipid Panel    Component Value Date/Time   CHOL 171 07/15/2014 1217   TRIG 54 07/15/2014 1217   HDL 53 07/15/2014 1217   CHOLHDL 3.2 07/15/2014 1217   VLDL 11 07/15/2014 1217   LDLCALC 107* 07/15/2014 1217      Wt Readings from Last 3 Encounters:  11/28/14 234 lb (106.142 kg)  07/15/14 236 lb 12.8 oz (107.412 kg)  05/17/14 242 lb 12.8 oz (110.133 kg)      Other studies Reviewed: Additional studies/ records that were reviewed today include:  Epic ER records.    ASSESSMENT AND PLAN:  1.  HTN:  Increase benicar to 40 mg and continue diuretic 2. Chest pain with abnormal ECG  F/u exercise myovue 3. Dsypnea  No clear etiology normal exam echo to assess diastolic /systolic function 4. DM:  Discussed low carb diet.  Target hemoglobin A1c is 6.5 or less.  Continue current medications.    Current medicines are reviewed at length with the patient today.  The patient does not have concerns regarding medicines.  The following changes have been made:  Benicar increased to 40 mg  Labs/ tests ordered today include:  Echo and exercise myovue  No orders of the defined types were placed in this encounter.     Disposition:   FU with me PRN    i    Signed, Jenkins Rouge, MD  12/23/2014 3:22 PM    [facility] [place], [place], [place]  [address] Phone: [phone]; Fax: [phone]

## 2014-12-26 ENCOUNTER — Encounter: Payer: Self-pay | Admitting: Cardiovascular Disease

## 2014-12-26 ENCOUNTER — Ambulatory Visit (INDEPENDENT_AMBULATORY_CARE_PROVIDER_SITE_OTHER): Payer: BLUE CROSS/BLUE SHIELD | Admitting: Cardiovascular Disease

## 2014-12-26 ENCOUNTER — Telehealth: Payer: Self-pay

## 2014-12-26 VITALS — BP 168/100 | HR 84 | Ht 65.5 in | Wt 240.8 lb

## 2014-12-26 DIAGNOSIS — R0600 Dyspnea, unspecified: Secondary | ICD-10-CM

## 2014-12-26 DIAGNOSIS — R0789 Other chest pain: Secondary | ICD-10-CM

## 2014-12-26 MED ORDER — OLMESARTAN MEDOXOMIL 40 MG PO TABS: 40.0000 mg | ORAL_TABLET | Freq: Every day | ORAL | Status: DC

## 2014-12-26 NOTE — Patient Instructions (Addendum)
Medication Instructions: INCREASE BENICAR TO   40 MG  EVERY DAY    Labwork: NONE  Testing/Procedures: Your physician has requested that you have an echocardiogram. Echocardiography is a painless test that uses sound waves to create images of your heart. It provides your doctor with information about the size and shape of your heart and how well your heart's chambers and valves are working. This procedure takes approximately one hour. There are no restrictions for this procedure.  Your physician has requested that you have en exercise stress myoview. For further information please visit [email or web address]. Please follow instruction sheet, as given.   Follow-Up: AS NEEDED  Any Other Special Instructions Will Be Listed Below (If Applicable).

## 2014-12-26 NOTE — Telephone Encounter (Signed)
Patient is requesting refills on hydrochlorothiazide (HYDRODIURIL) 25 MG tablet olmesartan (BENICAR) 20 MG tablet GLUCOPHAGE) 500 MG tablet   States [facility] - does not take her [employer] ins [phone]

## 2014-12-28 ENCOUNTER — Telehealth: Payer: Self-pay

## 2014-12-28 ENCOUNTER — Other Ambulatory Visit: Payer: Self-pay | Admitting: *Deleted

## 2014-12-28 NOTE — Telephone Encounter (Signed)
Left message for pt to call back  °

## 2014-12-28 NOTE — Telephone Encounter (Signed)
Pt called back, but cut off before I could make out what she was requesting. Please call patient back

## 2014-12-28 NOTE — Telephone Encounter (Signed)
[name] at 12/28/2014 10:56 AM     Status: Signed       Expand All Collapse All   Pt called back, but cut off before I could make out what she was requesting. Please call patient back            [place] at 12/28/2014 9:41 AM     Status: Signed       Expand All Collapse All   Left message for pt to call back.             Lindsay Travis at 12/26/2014 2:42 PM     Status: Signed       Expand All Collapse All   Patient is requesting refills on hydrochlorothiazide (HYDRODIURIL) 25 MG tablet olmesartan (BENICAR) 20 MG tablet GLUCOPHAGE) 500 MG tablet   States [facility] - does not take her [employer] ins [phone]

## 2014-12-30 NOTE — Telephone Encounter (Signed)
Called [facility] to see what was going on, but was on hold for over 5 minutes. Will try to call later.

## 2015-01-03 NOTE — Telephone Encounter (Signed)
Faxed PA to Dr. Johnsie Cancel, they faxed it to the wrong office. They will have to complete PA since he wrote  this medication for pt.  olmesartan (BENICAR) 40 MG tablet [[id number]

## 2015-01-04 ENCOUNTER — Other Ambulatory Visit: Payer: Self-pay

## 2015-01-04 DIAGNOSIS — I10 Essential (primary) hypertension: Secondary | ICD-10-CM

## 2015-01-04 NOTE — Telephone Encounter (Signed)
Dr Marin Comment, pt has appt sch w/you for 01/27/15. Can we give her 90 day RFs of the pended Rxs since she has to go through mail order now?

## 2015-01-05 ENCOUNTER — Telehealth: Payer: Self-pay

## 2015-01-05 DIAGNOSIS — I10 Essential (primary) hypertension: Secondary | ICD-10-CM

## 2015-01-05 MED ORDER — HYDROCHLOROTHIAZIDE 25 MG PO TABS: 25.0000 mg | ORAL_TABLET | Freq: Every day | ORAL | Status: DC

## 2015-01-05 MED ORDER — METFORMIN HCL 500 MG PO TABS
500.0000 mg | ORAL_TABLET | Freq: Two times a day (BID) | ORAL | Status: DC
Start: 2015-01-05 — End: 2016-03-10

## 2015-01-05 MED ORDER — OLMESARTAN MEDOXOMIL 40 MG PO TABS: 40.0000 mg | ORAL_TABLET | Freq: Every day | ORAL | Status: DC

## 2015-01-05 MED ORDER — METFORMIN HCL 500 MG PO TABS: 500.0000 mg | ORAL_TABLET | Freq: Two times a day (BID) | ORAL | Status: DC

## 2015-01-05 NOTE — Telephone Encounter (Signed)
Patient called stating her [employer] will not cover medications and she is requesting them to be sent to [facility]. She stated she is needing her blood pressure, Benicar and her metformin medications. I had a difficult time understanding her. She is requesting someone to call her back [phone]

## 2015-01-05 NOTE — Telephone Encounter (Signed)
Sent a 30 day supply. Appt on 01/27/2015

## 2015-01-06 ENCOUNTER — Encounter: Payer: Self-pay | Admitting: Family Medicine

## 2015-01-06 ENCOUNTER — Ambulatory Visit (INDEPENDENT_AMBULATORY_CARE_PROVIDER_SITE_OTHER): Payer: BLUE CROSS/BLUE SHIELD | Admitting: Family Medicine

## 2015-01-06 VITALS — BP 162/98 | HR 67 | Temp 98.5°F | Resp 16 | Ht 65.25 in | Wt 241.0 lb

## 2015-01-06 DIAGNOSIS — I10 Essential (primary) hypertension: Secondary | ICD-10-CM

## 2015-01-06 DIAGNOSIS — R319 Hematuria, unspecified: Secondary | ICD-10-CM

## 2015-01-06 DIAGNOSIS — F439 Reaction to severe stress, unspecified: Secondary | ICD-10-CM

## 2015-01-06 DIAGNOSIS — Z658 Other specified problems related to psychosocial circumstances: Secondary | ICD-10-CM

## 2015-01-06 DIAGNOSIS — N951 Menopausal and female climacteric states: Secondary | ICD-10-CM | POA: Diagnosis not present

## 2015-01-06 DIAGNOSIS — E119 Type 2 diabetes mellitus without complications: Secondary | ICD-10-CM | POA: Diagnosis not present

## 2015-01-06 DIAGNOSIS — R232 Flushing: Secondary | ICD-10-CM

## 2015-01-06 MED ORDER — METFORMIN HCL 500 MG PO TABS: ORAL_TABLET | ORAL | Status: DC

## 2015-01-06 MED ORDER — OLMESARTAN MEDOXOMIL 40 MG PO TABS: 40.0000 mg | ORAL_TABLET | Freq: Every day | ORAL | Status: DC

## 2015-01-06 MED ORDER — HYDROCHLOROTHIAZIDE 25 MG PO TABS: 25.0000 mg | ORAL_TABLET | Freq: Every day | ORAL | Status: DC

## 2015-01-06 NOTE — Progress Notes (Signed)
 Chief Complaint:  Chief Complaint  Patient presents with  . Medication Refill    ALL meds     HPI: Lindsay Travis is a 54 y.o. female who is here for: Hypertension, diabetes recheck. Has been without her medications for about 1-2 months. She states that her insurance plan requires that she goes to [facility] for her medication refills and there was some issue about the prescriptions sent to [facility]. She and she is depressed about it. She states that her depression is mostly related to her health. She did not have any of these health problems when she was in to go. She will came to [place] about 10 years ago. She owns her own business. She declines to take any medications for depressive symptoms. She has been noncompliant with her medications in the past with me. She is very skeptical of taking pills for her medical problems. However she is unable to lose weight and is unable to control her diabetes and her hypertension without medications. Currently she has no side effects and her current regiment. She has to take her hydrochlorothiazide in the morning otherwise she urinates a lot at night. Otherwise she is okay with her regimen currently. Denies any weakness. Has some intermittent tingling in her hands She is up-to-date on her eye exam She declines any vaccines  Past Medical History  Diagnosis Date  . Hypertension   . Anemia   . Shortness of breath     on exertion- has low hgb  . Diabetes mellitus     recent high blood sugar and has RX but not taking  med -b/c she doesn't think she  is diabetic, just had lots of sugar in her diet when  dx'd.  . Allergy    Past Surgical History  Procedure Laterality Date  . No past surgeries    . Abdominal hysterectomy  09/11/2011    Procedure: HYSTERECTOMY ABDOMINAL;  Surgeon: Frederico Hamman, MD;  Location: [facility];  Service: Gynecology;  Laterality: N/A;   History   Social History  . Marital Status: Divorced    Spouse Name: N/A    . Number of Children: N/A  . Years of Education: N/A   Social History Main Topics  . Smoking status: Never Smoker   . Smokeless tobacco: Never Used  . Alcohol Use: No  . Drug Use: No  . Sexual Activity: No   Other Topics Concern  . None   Social History Narrative   Family History  Problem Relation Age of Onset  . Hypertension Other    Allergies  Allergen Reactions  . Aspirin Other (See Comments)    Abdominal pain   Prior to Admission medications   Medication Sig Start Date End Date Taking? Authorizing Provider  hydrochlorothiazide (HYDRODIURIL) 25 MG tablet Take 1 tablet (25 mg total) by mouth daily with breakfast. 01/05/15  Yes Dorian Heckle English, PA  metFORMIN (GLUCOPHAGE) 500 MG tablet Take 1 tablet (500 mg total) by mouth 2 (two) times daily with a meal. 01/05/15  Yes Stephanie D English, PA  olmesartan (BENICAR) 40 MG tablet Take 1 tablet (40 mg total) by mouth at bedtime. 01/05/15  Yes Dorian Heckle English, PA  methocarbamol (ROBAXIN-750) 750 MG tablet Take 1 tablet (750 mg total) by mouth 4 (four) times daily. Patient not taking: Reported on 01/06/2015 05/17/14   Orma Flaming, MD     ROS: The patient denies fevers, chills, night sweats, unintentional weight loss, chest pain, palpitations, wheezing,  dyspnea on exertion, nausea, vomiting, abdominal pain, dysuria, hematuria, melena  All other systems have been reviewed and were otherwise negative with the exception of those mentioned in the HPI and as above.    PHYSICAL EXAM: Filed Vitals:   01/06/15 1144  BP: 162/98  Pulse: 67  Temp: 98.5 F (36.9 C)  Resp: 16   Filed Vitals:   01/06/15 1144  Height: 5' 5.25" (1.657 m)  Weight: 241 lb (109.317 kg)   Body mass index is 39.81 kg/(m^2).  General: Alert, no acute distress HEENT:  Normocephalic, atraumatic, oropharynx patent. EOMI, PERRLA. Funduscopic exam grossly normal Cardiovascular:  Regular rate and rhythm, no rubs murmurs or gallops.  No Carotid bruits,  radial pulse intact. No pedal edema.  Respiratory: Clear to auscultation bilaterally.  No wheezes, rales, or rhonchi.  No cyanosis, no use of accessory musculature GI: No organomegaly, abdomen is soft and non-tender, positive bowel sounds.  No masses. Skin: No rashes. Neurologic: Facial musculature symmetric. Psychiatric: Patient is appropriate throughout our interaction. Lymphatic: No cervical lymphadenopathy Musculoskeletal: Gait intact.  microfilament exam normal    LABS: Results for orders placed or performed in visit on 11/28/14  COMPLETE METABOLIC PANEL WITH GFR  Result Value Ref Range   Sodium 138 135 - 145 mEq/L   Potassium 3.8 3.5 - 5.3 mEq/L   Chloride 99 96 - 112 mEq/L   CO2 32 19 - 32 mEq/L   Glucose, Bld 103 (H) 70 - 99 mg/dL   BUN 14 6 - 23 mg/dL   Creat 0.76 0.50 - 1.10 mg/dL   Total Bilirubin 0.4 0.2 - 1.2 mg/dL   Alkaline Phosphatase 57 39 - 117 U/L   AST 16 0 - 37 U/L   ALT 15 0 - 35 U/L   Total Protein 7.4 6.0 - 8.3 g/dL   Albumin 4.6 3.5 - 5.2 g/dL   Calcium 9.8 8.4 - 10.5 mg/dL   GFR, Est African American >89 mL/min   GFR, Est Non African American >89 mL/min  TSH  Result Value Ref Range   TSH 1.708 0.350 - 4.500 uIU/mL  POCT CBC  Result Value Ref Range   WBC 4.7 4.6 - 10.2 K/uL   Lymph, poc 2.1 0.6 - 3.4   POC LYMPH PERCENT 44.0 10 - 50 %L   MID (cbc) 0.2 0 - 0.9   POC MID % 4.0 0 - 12 %M   POC Granulocyte 2.4 2 - 6.9   Granulocyte percent 52.0 37 - 80 %G   RBC 4.91 4.04 - 5.48 M/uL   Hemoglobin 13.9 12.2 - 16.2 g/dL   HCT, POC 42.5 37.7 - 47.9 %   MCV 86.6 80 - 97 fL   MCH, POC 28.2 27 - 31.2 pg   MCHC 32.6 31.8 - 35.4 g/dL   RDW, POC 13.9 %   Platelet Count, POC 208 142 - 424 K/uL   MPV 8.8 0 - 99.8 fL  POCT glycosylated hemoglobin (Hb A1C)  Result Value Ref Range   Hemoglobin A1C 6.9   POCT glucose (manual entry)  Result Value Ref Range   POC Glucose 121 (A) 70 - 99 mg/dl     EKG/XRAY:   Primary read interpreted by Dr. Marin Comment at  [facility].   ASSESSMENT/PLAN: Encounter Diagnoses  Name Primary?  . Essential hypertension Yes  . Type 2 diabetes mellitus without complication   . Hot flashes   . Stress   . Hematuria    This is a 54 year old African female from  [place] who is been in the [place] for the last 10 years with a past medical history of essential hypertension, type 2 diabetes, worsening stress level due to her poor health. She was recently seen here for shortness of breath and diaphoresis and was sent to the hospital for further evaluation. She had negative cardiac enzymes. She has had a cardiology evaluation with stress testing to follow in the next couple weeks. I will not repeat labs on her since all her labs within the last 2 months and have been normal. I will go ahead and refill her medicines. She will get printed prescriptions to walk over to [facility] with and see if she cannot get her medications. He has a history of hematuria and was seen by urology. They believe that the Benicar might be causing her hematuria.  It is listed as a side effect.Since she was evaluated in the ER and by cardiology her Benicar has been increased to control her blood pressure better. I would like her to continue on the Benicar dose that cardiology recommended and then come back in one month for lab testing and also a urine recheck. If she continues to have worsening hematuria then we may need to change her Benicar. I have tried on different medications in the past and each time she has had complaints regarding the medications. I'm not quite sure if those complaints were true adverse reactions or side effects. Follow-up in one month to follow-up on hypertension, hematuria, stress/depressive state, and diabetes. I have discussed with her risk and benefits of being on different medications for depression and also hot flashes. She states that she will try talking to God and taking black cohash/soy supplements prn once the cardiology stress  test is completed.   Gross sideeffects, risk and benefits, and alternatives of medications d/w patient. Patient is aware that all medications have potential sideeffects and we are unable to predict every sideeffect or drug-drug interaction that may occur.  , Tw[place], DO 01/10/2015 10:13 AM    nb

## 2015-01-11 ENCOUNTER — Telehealth: Payer: Self-pay

## 2015-01-11 ENCOUNTER — Telehealth (HOSPITAL_COMMUNITY): Payer: Self-pay | Admitting: *Deleted

## 2015-01-11 NOTE — Telephone Encounter (Signed)
Patient given detailed instructions per Myocardial Perfusion Study Information Sheet for test on 5/4/ 16 at 7:30/8:15 Patient verbalized understanding. Crissie Figures, RN

## 2015-01-11 NOTE — Telephone Encounter (Signed)
Pt can't get her benicar. She states Dr needs to call her insurance company. It is difficult to understand pt due to language barrier. Do we want to try to rx her something else that her insurance will pay for?

## 2015-01-11 NOTE — Telephone Encounter (Signed)
Pt states the pharmacy doesn't want to give her her bp meds, she is having trouble. Please call [phone], was told they want to ship her medicine to her but she is out

## 2015-01-12 ENCOUNTER — Telehealth: Payer: Self-pay

## 2015-01-12 ENCOUNTER — Other Ambulatory Visit: Payer: Self-pay | Admitting: Family Medicine

## 2015-01-12 DIAGNOSIS — I10 Essential (primary) hypertension: Secondary | ICD-10-CM

## 2015-01-12 NOTE — Telephone Encounter (Signed)
PA started. Awaiting response.

## 2015-01-12 NOTE — Telephone Encounter (Signed)
Pt is becoming very angry. I had a very hard time understanding her. She is checking on the status of this medication. Please advise at [phone]

## 2015-01-12 NOTE — Telephone Encounter (Signed)
Please do not send in Losartan , patient cannot tolerate due to HAs . She is ok on Benicar.  Please see  My note from 12/2013. We need a prior authorization for her.

## 2015-01-12 NOTE — Telephone Encounter (Signed)
Let's send a 30-day supply to the local pharmacy and send a 90-day supply to the mail order pharmacy.  If her insurance won't cover the one month supply of Benicar, we could use losartan, which may be cheaper for her.

## 2015-01-12 NOTE — Telephone Encounter (Signed)
PA started for Benicar.

## 2015-01-13 ENCOUNTER — Ambulatory Visit (HOSPITAL_BASED_OUTPATIENT_CLINIC_OR_DEPARTMENT_OTHER): Payer: BLUE CROSS/BLUE SHIELD

## 2015-01-13 ENCOUNTER — Telehealth: Payer: Self-pay

## 2015-01-13 ENCOUNTER — Ambulatory Visit (HOSPITAL_COMMUNITY): Payer: BLUE CROSS/BLUE SHIELD | Attending: Cardiology

## 2015-01-13 ENCOUNTER — Other Ambulatory Visit: Payer: Self-pay

## 2015-01-13 DIAGNOSIS — E669 Obesity, unspecified: Secondary | ICD-10-CM | POA: Insufficient documentation

## 2015-01-13 DIAGNOSIS — R0789 Other chest pain: Secondary | ICD-10-CM | POA: Diagnosis not present

## 2015-01-13 DIAGNOSIS — R0600 Dyspnea, unspecified: Secondary | ICD-10-CM

## 2015-01-13 DIAGNOSIS — R079 Chest pain, unspecified: Secondary | ICD-10-CM | POA: Diagnosis not present

## 2015-01-13 DIAGNOSIS — I10 Essential (primary) hypertension: Secondary | ICD-10-CM | POA: Insufficient documentation

## 2015-01-13 DIAGNOSIS — E119 Type 2 diabetes mellitus without complications: Secondary | ICD-10-CM | POA: Insufficient documentation

## 2015-01-13 MED ORDER — TECHNETIUM TC 99M SESTAMIBI GENERIC - CARDIOLITE
33.0000 | Freq: Once | INTRAVENOUS | Status: AC | PRN
  Administered 2015-01-13: 33 via INTRAVENOUS

## 2015-01-13 MED ORDER — REGADENOSON 0.4 MG/5ML IV SOLN
0.4000 mg | Freq: Once | INTRAVENOUS | Status: AC
  Administered 2015-01-13: 0.4 mg via INTRAVENOUS

## 2015-01-13 NOTE — Telephone Encounter (Signed)
Result Note     Normal echo with good EF and no significant valvular heart disease      Echocardiogram  Status: Finalresult Visible to patient:  Not Released Nextappt: 01/17/2015 at 12:45 PM in Cardiology ([facility] NM2/TREAD) Dx:  Dyspnea       Notes Recorded by Newt Minion, RN on 01/13/2015 at 12:22 PM Pt made aware of results no questions at this time.

## 2015-01-17 ENCOUNTER — Ambulatory Visit (HOSPITAL_COMMUNITY): Payer: BLUE CROSS/BLUE SHIELD | Attending: Cardiovascular Disease

## 2015-01-17 LAB — MYOCARDIAL PERFUSION IMAGING
CHL CUP RESTING HR STRESS: 63 {beats}/min
CHL CUP STRESS STAGE 1 GRADE: 0 %
CHL CUP STRESS STAGE 1 HR: 66 {beats}/min
CHL CUP STRESS STAGE 1 SPEED: 0 mph
CHL CUP STRESS STAGE 2 DBP: 107 mmHg
CHL CUP STRESS STAGE 2 HR: 62 {beats}/min
CHL CUP STRESS STAGE 2 SBP: 180 mmHg
CHL CUP STRESS STAGE 2 SPEED: 0 mph
CHL CUP STRESS STAGE 4 GRADE: 0 %
CHL CUP STRESS STAGE 5 DBP: 86 mmHg
CHL CUP STRESS STAGE 5 GRADE: 0 %
CHL CUP STRESS STAGE 5 SBP: 165 mmHg
CHL CUP STRESS STAGE 5 SPEED: 0 mph
CHL CUP STRESS STAGE 6 DBP: 86 mmHg
CSEPEW: 1 METS
CSEPPHR: 90 {beats}/min
LV dias vol: 100 mL
LVSYSVOL: 38 mL
NUC STRESS TID: 0.93
Nuc Stress EF: 62 %
Peak BP: 165 mmHg
Percent of predicted max HR: 53 %
RATE: 0.29
SDS: 2
SRS: 2
SSS: 2
Stage 1 DBP: 102 mmHg
Stage 1 SBP: 190 mmHg
Stage 2 Grade: 0 %
Stage 3 Grade: 0 %
Stage 3 HR: 61 {beats}/min
Stage 3 Speed: 0 mph
Stage 4 HR: 83 {beats}/min
Stage 4 Speed: 0 mph
Stage 5 HR: 90 {beats}/min
Stage 6 Grade: 0 %
Stage 6 HR: 86 {beats}/min
Stage 6 SBP: 152 mmHg
Stage 6 Speed: 0 mph
Stage 7 DBP: 89 mmHg
Stage 7 Grade: 0 %
Stage 7 HR: 77 {beats}/min
Stage 7 SBP: 164 mmHg
Stage 7 Speed: 0 mph

## 2015-01-17 MED ORDER — TECHNETIUM TC 99M SESTAMIBI GENERIC - CARDIOLITE
33.0000 | Freq: Once | INTRAVENOUS | Status: AC | PRN
  Administered 2015-01-17: 33 via INTRAVENOUS

## 2015-01-18 DIAGNOSIS — R0789 Other chest pain: Secondary | ICD-10-CM | POA: Diagnosis not present

## 2015-01-19 MED ORDER — VALSARTAN 40 MG PO TABS: 40.0000 mg | ORAL_TABLET | Freq: Every day | ORAL | Status: DC

## 2015-01-19 NOTE — Telephone Encounter (Signed)
Benicar was denied, alternatives included losarta, micradis, diovan, hyzaar, atacand. Patient was notified. She has had HA with Losartan so will try diovan

## 2015-01-27 ENCOUNTER — Ambulatory Visit: Payer: BLUE CROSS/BLUE SHIELD | Admitting: Family Medicine

## 2015-01-30 ENCOUNTER — Telehealth: Payer: Self-pay

## 2015-01-30 NOTE — Telephone Encounter (Signed)
PA sent to [facility] for Benicar 40mg 

## 2015-01-30 NOTE — Telephone Encounter (Signed)
Prior auth sent to [employer] for Benicar 40mg  thru [employer],

## 2015-02-02 ENCOUNTER — Telehealth: Payer: Self-pay

## 2015-02-02 NOTE — Telephone Encounter (Signed)
Auth for Benicar denied. Do you  want it appealed or switch to another ARB? Irbasartan or Telmisartan Hct should be covered. She is intolerant to Cozaar. Thanks.

## 2015-02-02 NOTE — Telephone Encounter (Signed)
avapro 150mg  daily is fine to substitue

## 2015-02-03 MED ORDER — IRBESARTAN 150 MG PO TABS: 150.0000 mg | ORAL_TABLET | Freq: Every day | ORAL | Status: DC

## 2015-02-03 NOTE — Telephone Encounter (Signed)
PT  NOTIFIED ./CY 

## 2015-02-17 LAB — HM DIABETES EYE EXAM

## 2015-03-28 ENCOUNTER — Ambulatory Visit: Payer: BLUE CROSS/BLUE SHIELD | Attending: Internal Medicine

## 2015-06-26 ENCOUNTER — Other Ambulatory Visit: Payer: Self-pay | Admitting: Physician Assistant

## 2015-07-07 ENCOUNTER — Ambulatory Visit (INDEPENDENT_AMBULATORY_CARE_PROVIDER_SITE_OTHER): Payer: 59 | Admitting: Emergency Medicine

## 2015-07-07 VITALS — BP 132/90 | HR 79 | Temp 98.7°F | Resp 20 | Ht 66.0 in | Wt 238.0 lb

## 2015-07-07 DIAGNOSIS — R7309 Other abnormal glucose: Secondary | ICD-10-CM

## 2015-07-07 DIAGNOSIS — N951 Menopausal and female climacteric states: Secondary | ICD-10-CM

## 2015-07-07 DIAGNOSIS — R232 Flushing: Secondary | ICD-10-CM

## 2015-07-07 DIAGNOSIS — R42 Dizziness and giddiness: Secondary | ICD-10-CM | POA: Diagnosis not present

## 2015-07-07 DIAGNOSIS — G4700 Insomnia, unspecified: Secondary | ICD-10-CM

## 2015-07-07 DIAGNOSIS — E119 Type 2 diabetes mellitus without complications: Secondary | ICD-10-CM

## 2015-07-07 DIAGNOSIS — R5383 Other fatigue: Secondary | ICD-10-CM

## 2015-07-07 DIAGNOSIS — I10 Essential (primary) hypertension: Secondary | ICD-10-CM

## 2015-07-07 LAB — POCT CBC
Granulocyte percent: 53.5 %G (ref 37–80)
HEMATOCRIT: 38.5 % (ref 37.7–47.9)
HEMOGLOBIN: 12.6 g/dL (ref 12.2–16.2)
LYMPH, POC: 2 (ref 0.6–3.4)
MCH, POC: 28.2 pg (ref 27–31.2)
MCHC: 32.8 g/dL (ref 31.8–35.4)
MCV: 86 fL (ref 80–97)
MID (cbc): 0.3 (ref 0–0.9)
MPV: 7.7 fL (ref 0–99.8)
PLATELET COUNT, POC: 192 10*3/uL (ref 142–424)
POC Granulocyte: 2.6 (ref 2–6.9)
POC LYMPH %: 40.7 % (ref 10–50)
POC MID %: 5.8 %M (ref 0–12)
RBC: 4.48 M/uL (ref 4.04–5.48)
RDW, POC: 14 %
WBC: 4.9 10*3/uL (ref 4.6–10.2)

## 2015-07-07 LAB — COMPREHENSIVE METABOLIC PANEL
ALK PHOS: 57 U/L (ref 33–130)
ALT: 18 U/L (ref 6–29)
AST: 17 U/L (ref 10–35)
Albumin: 4.2 g/dL (ref 3.6–5.1)
BILIRUBIN TOTAL: 0.3 mg/dL (ref 0.2–1.2)
BUN: 17 mg/dL (ref 7–25)
CALCIUM: 9.4 mg/dL (ref 8.6–10.4)
CO2: 31 mmol/L (ref 20–31)
Chloride: 102 mmol/L (ref 98–110)
Creat: 0.97 mg/dL (ref 0.50–1.05)
GLUCOSE: 97 mg/dL (ref 65–99)
POTASSIUM: 3.7 mmol/L (ref 3.5–5.3)
Sodium: 138 mmol/L (ref 135–146)
TOTAL PROTEIN: 6.9 g/dL (ref 6.1–8.1)

## 2015-07-07 LAB — LIPID PANEL
CHOLESTEROL: 200 mg/dL (ref 125–200)
HDL: 61 mg/dL (ref >=46)
LDL Cholesterol: 121 mg/dL (ref <130)
Total CHOL/HDL Ratio: 3.3 Ratio (ref <=5.0)
Triglycerides: 89 mg/dL (ref <150)
VLDL: 18 mg/dL (ref <30)

## 2015-07-07 LAB — TSH: TSH: 1.858 u[IU]/mL (ref 0.350–4.500)

## 2015-07-07 LAB — POCT URINALYSIS DIP (MANUAL ENTRY)
Bilirubin, UA: NEGATIVE
GLUCOSE UA: NEGATIVE
Ketones, POC UA: NEGATIVE
Leukocytes, UA: NEGATIVE
NITRITE UA: NEGATIVE
PH UA: 5.5
Protein Ur, POC: NEGATIVE
SPEC GRAV UA: 1.02
UROBILINOGEN UA: 0.2

## 2015-07-07 LAB — GLUCOSE, POCT (MANUAL RESULT ENTRY): POC GLUCOSE: 101 mg/dL — AB (ref 70–99)

## 2015-07-07 LAB — POC MICROSCOPIC URINALYSIS (UMFC): Mucus: ABSENT

## 2015-07-07 LAB — HEMOGLOBIN A1C: HEMOGLOBIN A1C: 7.4 % — AB (ref 4.0–6.0)

## 2015-07-07 LAB — POCT GLYCOSYLATED HEMOGLOBIN (HGB A1C): HEMOGLOBIN A1C: 7.4

## 2015-07-07 MED ORDER — METFORMIN HCL 500 MG PO TABS: ORAL_TABLET | ORAL | Status: DC

## 2015-07-07 MED ORDER — OLMESARTAN MEDOXOMIL 40 MG PO TABS: 40.0000 mg | ORAL_TABLET | Freq: Every day | ORAL | Status: DC

## 2015-07-07 MED ORDER — BLOOD GLUCOSE MONITOR KIT: PACK | Status: DC

## 2015-07-07 MED ORDER — HYDROCHLOROTHIAZIDE 25 MG PO TABS: ORAL_TABLET | ORAL | Status: DC

## 2015-07-07 NOTE — Progress Notes (Signed)
Subjective:  Patient ID: [name], female    DOB: 1961-01-26  Age: 54 y.o. MRN: [id number]  CC: Annual Exam   HPI Shriley Joffe presents  was in requesting a physical examination. However she is complaining of dizziness and fatigue. Bothering her for 2 weeks. She has insomnia and he has been waking up at 12 or 1:00 in the morning every night for months. She denies any stress or anxiety. She has no difficulty falling asleep. She is under treatment for diabetes hypertension and has recently undergone a hysterectomy. She is not checking her blood sugar for the last 6 months because she is broken her meter. She is tolerating her medication with no adverse effect she has no evidence of an endorgan injury.  History Jeaneane has a past medical history of Hypertension; Anemia; Shortness of breath; Diabetes mellitus; Allergy; and Clotting disorder ([place]).   She has past surgical history that includes No past surgeries and Abdominal hysterectomy (09/11/2011).   Her  family history includes Hypertension in her other.  She   reports that she has never smoked. She has never used smokeless tobacco. She reports that she does not drink alcohol or use illicit drugs.  Outpatient Prescriptions Prior to Visit  Medication Sig Dispense Refill  . methocarbamol (ROBAXIN-750) 750 MG tablet Take 1 tablet (750 mg total) by mouth 4 (four) times daily. 40 tablet 1  . valsartan (DIOVAN) 40 MG tablet Take 1 tablet (40 mg total) by mouth daily. (Patient not taking: Reported on 07/07/2015) 30 tablet 1  . hydrochlorothiazide (HYDRODIURIL) 25 MG tablet TAKE ONE TABLET BY MOUTH DAILY WITH BREAKFAST.  "OFFICE VISIT NEEDED FOR REFILLS" (Patient not taking: Reported on 07/07/2015) 30 tablet 0  . irbesartan (AVAPRO) 150 MG tablet Take 1 tablet (150 mg total) by mouth daily. 30 tablet 3  . metFORMIN (GLUCOPHAGE) 500 MG tablet Take 2 tabs PO in the AM, then take 1 tab PO in the PM 270 tablet 1   No facility-administered  medications prior to visit.    Social History   Social History  . Marital Status: Divorced    Spouse Name: N/A  . Number of Children: N/A  . Years of Education: N/A   Social History Main Topics  . Smoking status: Never Smoker   . Smokeless tobacco: Never Used  . Alcohol Use: No  . Drug Use: No  . Sexual Activity: No   Other Topics Concern  . None   Social History Narrative     Review of Systems  Constitutional: Positive for fatigue. Negative for fever, chills and appetite change.  HENT: Negative for congestion, ear pain, postnasal drip, sinus pressure and sore throat.   Eyes: Negative for pain and redness.  Respiratory: Negative for cough, shortness of breath and wheezing.   Cardiovascular: Negative for leg swelling.  Gastrointestinal: Negative for nausea, vomiting, abdominal pain, diarrhea, constipation and blood in stool.  Endocrine: Negative for polyuria.  Genitourinary: Negative for dysuria, urgency, frequency and flank pain.  Musculoskeletal: Negative for gait problem.  Skin: Negative for rash.  Neurological: Positive for dizziness. Negative for weakness and headaches.  Psychiatric/Behavioral: Positive for sleep disturbance. Negative for confusion and decreased concentration. The patient is not nervous/anxious.     Objective:  BP 132/90 mmHg  Pulse 79  Temp(Src) 98.7 F (37.1 C) (Oral)  Resp 20  Ht _0  (1.676 m)  Wt 238 lb (107.956 kg)  BMI 38.43 kg/m2  SpO2 98%  LMP 08/13/2011  Physical Exam  Constitutional: She  is oriented to person, place, and time. She appears well-developed and well-nourished. No distress.  HENT:  Head: Normocephalic and atraumatic.  Right Ear: External ear normal.  Left Ear: External ear normal.  Nose: Nose normal.  Eyes: Conjunctivae and EOM are normal. Pupils are equal, round, and reactive to light. No scleral icterus.  Neck: Normal range of motion. Neck supple. No tracheal deviation present.  Cardiovascular: Normal rate,  regular rhythm and normal heart sounds.   Pulmonary/Chest: Effort normal. No respiratory distress. She has no wheezes. She has no rales.  Abdominal: She exhibits no mass. There is no tenderness. There is no rebound and no guarding.  Musculoskeletal: She exhibits no edema.  Lymphadenopathy:    She has no cervical adenopathy.  Neurological: She is alert and oriented to person, place, and time. Coordination normal.  Skin: Skin is warm and dry. No rash noted.  Psychiatric: She has a normal mood and affect. Her behavior is normal.      Assessment & Plan:   Bekki was seen today for annual exam.  Diagnoses and all orders for this visit:  Dizziness and giddiness -     POCT CBC -     POCT glycosylated hemoglobin (Hb A1C) -     POCT glucose (manual entry) -     POCT urinalysis dipstick -     POCT Microscopic Urinalysis ([facility]) -     Comprehensive metabolic panel -     Lipid panel -     TSH  Other fatigue -     POCT CBC -     POCT glycosylated hemoglobin (Hb A1C) -     POCT glucose (manual entry) -     POCT urinalysis dipstick -     POCT Microscopic Urinalysis ([facility]) -     Comprehensive metabolic panel -     Lipid panel -     TSH  Insomnia -     POCT CBC -     POCT glycosylated hemoglobin (Hb A1C) -     POCT glucose (manual entry) -     POCT urinalysis dipstick -     POCT Microscopic Urinalysis ([facility]) -     Comprehensive metabolic panel -     Lipid panel -     TSH  Other abnormal glucose -     POCT CBC -     POCT glycosylated hemoglobin (Hb A1C) -     POCT glucose (manual entry) -     POCT urinalysis dipstick -     POCT Microscopic Urinalysis ([facility]) -     Comprehensive metabolic panel -     Lipid panel -     TSH  Essential hypertension, benign -     POCT CBC -     POCT glycosylated hemoglobin (Hb A1C) -     POCT glucose (manual entry) -     POCT urinalysis dipstick -     POCT Microscopic Urinalysis ([facility]) -     Comprehensive metabolic panel -     Lipid  panel -     TSH  Essential hypertension -     metFORMIN (GLUCOPHAGE) 500 MG tablet; Take 2 tabs PO in the AM, then take 1 tab PO in the PM  Type 2 diabetes mellitus without complication, without long-term current use of insulin (HCC) -     metFORMIN (GLUCOPHAGE) 500 MG tablet; Take 2 tabs PO in the AM, then take 1 tab PO in the PM  Hot flashes -  metFORMIN (GLUCOPHAGE) 500 MG tablet; Take 2 tabs PO in the AM, then take 1 tab PO in the PM  Other orders -     olmesartan (BENICAR) 40 MG tablet; Take 1 tablet (40 mg total) by mouth daily. -     hydrochlorothiazide (HYDRODIURIL) 25 MG tablet; TAKE ONE TABLET BY MOUTH DAILY WITH BREAKFAST.  "OFFICE VISIT NEEDED FOR REFILLS" -     blood glucose meter kit and supplies KIT; Dispense based on patient and insurance preference. Use up to four times daily as directed. (FOR ICD-9 250.00, 250.01).   I have discontinued Ms. Quakenbush's irbesartan. I have also changed her olmesartan. Additionally, I am having her start on blood glucose meter kit and supplies. Lastly, I am having her maintain her methocarbamol, valsartan, metFORMIN, and hydrochlorothiazide.  Meds ordered this encounter  Medications  . DISCONTD: olmesartan (BENICAR) 40 MG tablet    Sig: Take 40 mg by mouth daily.  . metFORMIN (GLUCOPHAGE) 500 MG tablet    Sig: Take 2 tabs PO in the AM, then take 1 tab PO in the PM    Dispense:  270 tablet    Refill:  1  . olmesartan (BENICAR) 40 MG tablet    Sig: Take 1 tablet (40 mg total) by mouth daily.    Dispense:  30 tablet    Refill:  5  . hydrochlorothiazide (HYDRODIURIL) 25 MG tablet    Sig: TAKE ONE TABLET BY MOUTH DAILY WITH BREAKFAST.  "OFFICE VISIT NEEDED FOR REFILLS"    Dispense:  30 tablet    Refill:  5  . blood glucose meter kit and supplies KIT    Sig: Dispense based on patient and insurance preference. Use up to four times daily as directed. (FOR ICD-9 250.00, 250.01).    Dispense:  1 each    Refill:  0    Order Specific  Question:  Number of strips    Answer:  100    Order Specific Question:  Number of lancets    Answer:  100    Appropriate red flag conditions were discussed with the patient as well as actions that should be taken.  Patient expressed his understanding.  Follow-up: Return if symptoms worsen or fail to improve.  Roselee Culver, MD   Results for orders placed or performed in visit on 07/07/15  POCT CBC  Result Value Ref Range   WBC 4.9 4.6 - 10.2 K/uL   Lymph, poc 2.0 0.6 - 3.4   POC LYMPH PERCENT 40.7 10 - 50 %L   MID (cbc) 0.3 0 - 0.9   POC MID % 5.8 0 - 12 %M   POC Granulocyte 2.6 2 - 6.9   Granulocyte percent 53.5 37 - 80 %G   RBC 4.48 4.04 - 5.48 M/uL   Hemoglobin 12.6 12.2 - 16.2 g/dL   HCT, POC 38.5 37.7 - 47.9 %   MCV 86.0 80 - 97 fL   MCH, POC 28.2 27 - 31.2 pg   MCHC 32.8 31.8 - 35.4 g/dL   RDW, POC 14.0 %   Platelet Count, POC 192 142 - 424 K/uL   MPV 7.7 0 - 99.8 fL  POCT glycosylated hemoglobin (Hb A1C)  Result Value Ref Range   Hemoglobin A1C 7.4   POCT glucose (manual entry)  Result Value Ref Range   POC Glucose 101 (A) 70 - 99 mg/dl  POCT urinalysis dipstick  Result Value Ref Range   Color, UA yellow yellow   Clarity, UA  clear clear   Glucose, UA negative negative   Bilirubin, UA negative negative   Ketones, POC UA negative negative   Spec Grav, UA 1.020    Blood, UA moderate (A) negative   pH, UA 5.5    Protein Ur, POC negative negative   Urobilinogen, UA 0.2    Nitrite, UA Negative Negative   Leukocytes, UA Negative Negative  POCT Microscopic Urinalysis ([facility])  Result Value Ref Range   WBC,UR,HPF,POC None None WBC/hpf   RBC,UR,HPF,POC Few (A) None RBC/hpf   Bacteria None None, Too numerous to count   Mucus Absent Absent   Epithelial Cells, UR Per Microscopy Moderate (A) None, Too numerous to count cells/hpf   Hyaline Cast

## 2015-07-07 NOTE — Patient Instructions (Signed)
Fatigue  Fatigue is feeling tired all of the time, a lack of energy, or a lack of motivation. Occasional or mild fatigue is often a normal response to activity or life in general. However, long-lasting (chronic) or extreme fatigue may indicate an underlying medical condition.  HOME CARE INSTRUCTIONS   Watch your fatigue for any changes. The following actions may help to lessen any discomfort you are feeling:  · Talk to your health care provider about how much sleep you need each night. Try to get the required amount every night.  · Take medicines only as directed by your health care provider.  · Eat a healthy and nutritious diet. Ask your health care provider if you need help changing your diet.  · Drink enough fluid to keep your urine clear or pale yellow.  · Practice ways of relaxing, such as yoga, meditation, massage therapy, or acupuncture.  · Exercise regularly.    · Change situations that cause you stress. Try to keep your work and personal routine reasonable.  · Do not abuse illegal drugs.  · Limit alcohol intake to no more than 1 drink per day for nonpregnant women and 2 drinks per day for men. One drink equals 12 ounces of beer, 5 ounces of wine, or 1½ ounces of hard liquor.  · Take a multivitamin, if directed by your health care provider.  SEEK MEDICAL CARE IF:   · Your fatigue does not get better.  · You have a fever.    · You have unintentional weight loss or gain.  · You have headaches.    · You have difficulty:      Falling asleep.    Sleeping throughout the night.  · You feel angry, guilty, anxious, or sad.     · You are unable to have a bowel movement (constipation).    · You skin is dry.     · Your legs or another part of your body is swollen.    SEEK IMMEDIATE MEDICAL CARE IF:   · You feel confused.    · Your vision is blurry.  · You feel faint or pass out.    · You have a severe headache.    · You have severe abdominal, pelvic, or back pain.    · You have chest pain, shortness of breath, or an  irregular or fast heartbeat.    · You are unable to urinate or you urinate less than normal.    · You develop abnormal bleeding, such as bleeding from the rectum, vagina, nose, lungs, or nipples.  · You vomit blood.     · You have thoughts about harming yourself or committing suicide.    · You are worried that you might harm someone else.       This information is not intended to replace advice given to you by your health care provider. Make sure you discuss any questions you have with your health care provider.     Document Released: 06/23/2007 Document Revised: 09/16/2014 Document Reviewed: 12/28/2013  Elsevier Interactive Patient Education ©2016 [employer]

## 2015-07-12 ENCOUNTER — Encounter: Payer: Self-pay | Admitting: Family Medicine

## 2015-08-07 ENCOUNTER — Telehealth: Payer: Self-pay | Admitting: Family Medicine

## 2015-08-07 ENCOUNTER — Encounter: Payer: Self-pay | Admitting: Family Medicine

## 2015-08-07 DIAGNOSIS — E1165 Type 2 diabetes mellitus with hyperglycemia: Secondary | ICD-10-CM | POA: Insufficient documentation

## 2015-08-07 DIAGNOSIS — Z1211 Encounter for screening for malignant neoplasm of colon: Secondary | ICD-10-CM

## 2015-08-07 DIAGNOSIS — E118 Type 2 diabetes mellitus with unspecified complications: Secondary | ICD-10-CM

## 2015-08-07 NOTE — Telephone Encounter (Signed)
Spoke with patient and she did have a eye exam with Vladimir Faster on [address] in June.  They are going to fax over her report.  She also agreed to have her colonoscopy scheduled.

## 2015-08-14 ENCOUNTER — Encounter: Payer: Self-pay | Admitting: Family Medicine

## 2015-08-22 ENCOUNTER — Encounter: Payer: Self-pay | Admitting: Internal Medicine

## 2015-10-24 ENCOUNTER — Encounter: Payer: BLUE CROSS/BLUE SHIELD | Admitting: Internal Medicine

## 2016-01-16 ENCOUNTER — Ambulatory Visit (INDEPENDENT_AMBULATORY_CARE_PROVIDER_SITE_OTHER): Payer: Self-pay | Admitting: Physician Assistant

## 2016-01-16 VITALS — BP 130/100 | HR 58 | Temp 98.2°F | Resp 16 | Ht 66.0 in | Wt 235.8 lb

## 2016-01-16 DIAGNOSIS — M545 Low back pain: Secondary | ICD-10-CM

## 2016-01-16 DIAGNOSIS — E119 Type 2 diabetes mellitus without complications: Secondary | ICD-10-CM

## 2016-01-16 DIAGNOSIS — T148XXA Other injury of unspecified body region, initial encounter: Secondary | ICD-10-CM

## 2016-01-16 DIAGNOSIS — I10 Essential (primary) hypertension: Secondary | ICD-10-CM

## 2016-01-16 LAB — POCT CBC
GRANULOCYTE PERCENT: 53.9 % (ref 37–80)
HEMATOCRIT: 35.9 % — AB (ref 37.7–47.9)
Hemoglobin: 12.1 g/dL — AB (ref 12.2–16.2)
Lymph, poc: 1.7 (ref 0.6–3.4)
MCH, POC: 28 pg (ref 27–31.2)
MCHC: 33.8 g/dL (ref 31.8–35.4)
MCV: 83 fL (ref 80–97)
MID (cbc): 0.3 (ref 0–0.9)
MPV: 8.5 fL (ref 0–99.8)
PLATELET COUNT, POC: 186 10*3/uL (ref 142–424)
POC Granulocyte: 2.3 (ref 2–6.9)
POC LYMPH %: 39.1 % (ref 10–50)
POC MID %: 7 %M (ref 0–12)
RBC: 4.32 M/uL (ref 4.04–5.48)
RDW, POC: 14 %
WBC: 4.3 10*3/uL — AB (ref 4.6–10.2)

## 2016-01-16 LAB — GLUCOSE, POCT (MANUAL RESULT ENTRY): POC GLUCOSE: 102 mg/dL — AB (ref 70–99)

## 2016-01-16 LAB — POCT GLYCOSYLATED HEMOGLOBIN (HGB A1C): Hemoglobin A1C: 7.4

## 2016-01-16 MED ORDER — AMLODIPINE BESYLATE 5 MG PO TABS
5.0000 mg | ORAL_TABLET | Freq: Every day | ORAL | Status: DC
Start: 2016-01-16 — End: 2017-12-31

## 2016-01-16 MED ORDER — HYDROCHLOROTHIAZIDE 25 MG PO TABS: ORAL_TABLET | ORAL | Status: DC

## 2016-01-16 MED ORDER — CYCLOBENZAPRINE HCL 5 MG PO TABS: 5.0000 mg | ORAL_TABLET | Freq: Three times a day (TID) | ORAL | Status: DC | PRN

## 2016-01-16 NOTE — Patient Instructions (Addendum)
IF you received an x-ray today, you will receive an invoice from [facility]. Please contact [facility] at [phone] with questions or concerns regarding your invoice.   IF you received labwork today, you will receive an invoice from [employer]. Please contact [facility] at [phone] with questions or concerns regarding your invoice.   Our billing staff will not be able to assist you with questions regarding bills from these companies.  You will be contacted with the lab results as soon as they are available. The fastest way to get your results is to activate your My Chart account. Instructions are located on the last page of this paperwork. If you have not heard from [place] regarding the results in 2 weeks, please contact this office.    Please stretch your neck and back three times per day.  You can apply ice to the area. I will give you flexeril.  Try 1 tablet to see how you feel with this.  This can be sedating. We will start the amlodipine.  You can take this at night.   Check your blood pressure twice per week.  If your blood pressure is over 140/90, then you need to return.    Muscle Strain A muscle strain is an injury that occurs when a muscle is stretched beyond its normal length. Usually a small number of muscle fibers are torn when this happens. Muscle strain is rated in degrees. First-degree strains have the least amount of muscle fiber tearing and pain. Second-degree and third-degree strains have increasingly more tearing and pain.  Usually, recovery from muscle strain takes 1-2 weeks. Complete healing takes 5-6 weeks.  CAUSES  Muscle strain happens when a sudden, violent force placed on a muscle stretches it too far. This may occur with lifting, sports, or a fall.  RISK FACTORS Muscle strain is especially common in athletes.  SIGNS AND SYMPTOMS At the site of the muscle strain, there may  be:  Pain.  Bruising.  Swelling.  Difficulty using the muscle due to pain or lack of normal function. DIAGNOSIS  Your health care provider will perform a physical exam and ask about your medical history. TREATMENT  Often, the best treatment for a muscle strain is resting, icing, and applying cold compresses to the injured area.  HOME CARE INSTRUCTIONS   Use the PRICE method of treatment to promote muscle healing during the first 2-3 days after your injury. The PRICE method involves:  Protecting the muscle from being injured again.  Restricting your activity and resting the injured body part.  Icing your injury. To do this, put ice in a plastic bag. Place a towel between your skin and the bag. Then, apply the ice and leave it on from 15-20 minutes each hour. After the third day, switch to moist heat packs.  Apply compression to the injured area with a splint or elastic bandage. Be careful not to wrap it too tightly. This may interfere with blood circulation or increase swelling.  Elevate the injured body part above the level of your heart as often as you can.  Only take over-the-counter or prescription medicines for pain, discomfort, or fever as directed by your health care provider.  Warming up prior to exercise helps to prevent future muscle strains. SEEK MEDICAL CARE IF:   You have increasing pain or swelling in the injured area.  You have numbness, tingling, or a significant loss of strength in the injured area. MAKE SURE YOU:  Understand these instructions.  Will watch your condition.  Will get help right away if you are not doing well or get worse.   This information is not intended to replace advice given to you by your health care provider. Make sure you discuss any questions you have with your health care provider.   Document Released: 08/26/2005 Document Revised: 06/16/2013 Document Reviewed: 03/25/2013 Elsevier Interactive Patient Education 2016 Elsevier  Inc. Amlodipine tablets What is this medicine? AMLODIPINE (am LOE di peen) is a calcium-channel blocker. It affects the amount of calcium found in your heart and muscle cells. This relaxes your blood vessels, which can reduce the amount of work the heart has to do. This medicine is used to lower high blood pressure. It is also used to prevent chest pain. This medicine may be used for other purposes; ask your health care provider or pharmacist if you have questions. What should I tell my health care provider before I take this medicine? They need to know if you have any of these conditions: -heart problems like heart failure or aortic stenosis -liver disease -an unusual or allergic reaction to amlodipine, other medicines, foods, dyes, or preservatives -pregnant or trying to get pregnant -breast-feeding How should I use this medicine? Take this medicine by mouth with a glass of water. Follow the directions on the prescription label. Take your medicine at regular intervals. Do not take more medicine than directed. Talk to your pediatrician regarding the use of this medicine in children. Special care may be needed. This medicine has been used in children as young as 6. Persons over 61 years old may have a stronger reaction to this medicine and need smaller doses. Overdosage: If you think you have taken too much of this medicine contact a poison control center or emergency room at once. NOTE: This medicine is only for you. Do not share this medicine with others. What if I miss a dose? If you miss a dose, take it as soon as you can. If it is almost time for your next dose, take only that dose. Do not take double or extra doses. What may interact with this medicine? -herbal or dietary supplements -local or general anesthetics -medicines for high blood pressure -medicines for prostate problems -rifampin This list may not describe all possible interactions. Give your health care provider a list of  all the medicines, herbs, non-prescription drugs, or dietary supplements you use. Also tell them if you smoke, drink alcohol, or use illegal drugs. Some items may interact with your medicine. What should I watch for while using this medicine? Visit your doctor or health care professional for regular check ups. Check your blood pressure and pulse rate regularly. Ask your health care professional what your blood pressure and pulse rate should be, and when you should contact him or her. This medicine may make you feel confused, dizzy or lightheaded. Do not drive, use machinery, or do anything that needs mental alertness until you know how this medicine affects you. To reduce the risk of dizzy or fainting spells, do not sit or stand up quickly, especially if you are an older patient. Avoid alcoholic drinks; they can make you more dizzy. Do not suddenly stop taking amlodipine. Ask your doctor or health care professional how you can gradually reduce the dose. What side effects may I notice from receiving this medicine? Side effects that you should report to your doctor or health care professional as soon as possible: -allergic reactions like skin rash, itching or  hives, swelling of the face, lips, or tongue -breathing problems -changes in vision or hearing -chest pain -fast, irregular heartbeat -swelling of legs or ankles Side effects that usually do not require medical attention (report to your doctor or health care professional if they continue or are bothersome): -dry mouth -facial flushing -nausea, vomiting -stomach gas, pain -tired, weak -trouble sleeping This list may not describe all possible side effects. Call your doctor for medical advice about side effects. You may report side effects to FDA at 1-800-FDA-1088. Where should I keep my medicine? Keep out of the reach of children. Store at room temperature between 59 and 86 degrees F (15 and 30 degrees C). Protect from light. Keep container  tightly closed. Throw away any unused medicine after the expiration date. NOTE: This sheet is a summary. It may not cover all possible information. If you have questions about this medicine, talk to your doctor, pharmacist, or health care provider.    2016, Elsevier/Gold Standard. (2012-07-24 11:40:58)

## 2016-01-16 NOTE — Progress Notes (Signed)
[facility] [address], [place] [address] [phone]  Date:  01/16/2016   Name:  Lindsay Travis   DOB:  [birth year]   MRN:  [id number]  PCP:  Philis Fendt, MD   Chief Complaint  Patient presents with  . Back Pain    started last week  . Neck Pain    History of Present Illness:  Lindsay Travis is a 55 y.o. female patient who presents to [facility] for cc of back and neck pain.   Pain at eft shoulder and at lower back for about 1 week.  It has radiated down her left leg.  She has had no weakness.  No fever or incontinence.  She denies any trauma, but states that she has been doing strenuous work lifting heavy boxes and climbing stairs.  She has pain running down her left leg.  No trouble breathing.  No chest pain, leg swelling, sob, palpitations, or vision changes.  No dysuria, or hematuria  BP is elevated today.  She states that she has not taken her blood pressure medication today.  She is taking the hctz, but has not been taking the benicar, stating it does not make her feel good.  She explains that she has some pain in her lower hips.   Patient also reports recurrent anemia, and would like to have this checked.  She has no dizziness, or fatigue.    Patient Active Problem List   Diagnosis Date Noted  . Controlled diabetes mellitus type II without complication ([place]) 00/86/7619  . Bilateral knee pain 05/26/2012  . Varicose veins of lower extremities with other complications 50/93/2671  . Fibroid, uterus 04/18/2011  . Metrorrhagia 04/18/2011  . Hypertension 04/18/2011    Past Medical History  Diagnosis Date  . Hypertension   . Anemia   . Shortness of breath     on exertion- has low hgb  . Diabetes mellitus     recent high blood sugar and has RX but not taking  med -b/c she doesn't think she  is diabetic, just had lots of sugar in her diet when  dx'd.  . Allergy   . Clotting disorder [facility])     Past Surgical History  Procedure Laterality Date  .  No past surgeries    . Abdominal hysterectomy  09/11/2011    Procedure: HYSTERECTOMY ABDOMINAL;  Surgeon: Frederico Hamman, MD;  Location: [facility];  Service: Gynecology;  Laterality: N/A;    Social History  Substance Use Topics  . Smoking status: Never Smoker   . Smokeless tobacco: Never Used  . Alcohol Use: No    Family History  Problem Relation Age of Onset  . Hypertension Other     Allergies  Allergen Reactions  . Lisinopril Other (See Comments)    Did not feel good  . Losartan Other (See Comments)    headache  . Aspirin Other (See Comments)    Abdominal pain    Medication list has been reviewed and updated.  Current Outpatient Prescriptions on File Prior to Visit  Medication Sig Dispense Refill  . blood glucose meter kit and supplies KIT Dispense based on patient and insurance preference. Use up to four times daily as directed. (FOR ICD-9 250.00, 250.01). 1 each 0  . metFORMIN (GLUCOPHAGE) 500 MG tablet Take 2 tabs PO in the AM, then take 1 tab PO in the PM 270 tablet 1  . hydrochlorothiazide (HYDRODIURIL) 25 MG tablet TAKE ONE TABLET BY MOUTH DAILY  WITH BREAKFAST.  "OFFICE VISIT NEEDED FOR REFILLS" (Patient not taking: Reported on 01/16/2016) 30 tablet 5  . methocarbamol (ROBAXIN-750) 750 MG tablet Take 1 tablet (750 mg total) by mouth 4 (four) times daily. (Patient not taking: Reported on 01/16/2016) 40 tablet 1  . olmesartan (BENICAR) 40 MG tablet Take 1 tablet (40 mg total) by mouth daily. (Patient not taking: Reported on 01/16/2016) 30 tablet 5  . valsartan (DIOVAN) 40 MG tablet Take 1 tablet (40 mg total) by mouth daily. (Patient not taking: Reported on 07/07/2015) 30 tablet 1   No current facility-administered medications on file prior to visit.    ROS ROS otherwise unremarkable unless listed above.   Physical Examination: BP 130/100 mmHg  Pulse 58  Temp(Src) 98.2 F (36.8 C) (Oral)  Resp 16  Ht '5\' 6"'  (1.676 m)  Wt 235 lb 12.8 oz (106.958 kg)  BMI 38.08 kg/m2   SpO2 99%  LMP 08/13/2011 Ideal Body Weight: Weight in (lb) to have BMI = 25: 154.6  Physical Exam  Constitutional: She is oriented to person, place, and time. She appears well-developed and well-nourished. No distress.  HENT:  Head: Normocephalic and atraumatic.  Right Ear: External ear normal.  Left Ear: External ear normal.  Eyes: Conjunctivae and EOM are normal. Pupils are equal, round, and reactive to light.  Cardiovascular: Normal rate and regular rhythm.  Exam reveals no gallop and no friction rub.   No murmur heard. Pulses:      Radial pulses are 2+ on the right side, and 2+ on the left side.       Dorsalis pedis pulses are 2+ on the right side, and 2+ on the left side.  Pulmonary/Chest: Effort normal. No respiratory distress. She has no decreased breath sounds. She has no wheezes. She has no rhonchi.  Musculoskeletal: Normal range of motion. She exhibits no edema.       Right shoulder: She exhibits normal range of motion, no bony tenderness, no spasm and normal pulse.  No erythema, or swelling present.  No bony tenderness of the cervical to lower back.  Good range of motion of the cervical.  no hip tenderness.  Normal external internal rotation of the hip.  Negative straight leg test.  Normal strength of lower extremity.   Neurological: She is alert and oriented to person, place, and time.  Skin: She is not diaphoretic.  Psychiatric: She has a normal mood and affect. Her behavior is normal.   Results for orders placed or performed in visit on 01/16/16  POCT glycosylated hemoglobin (Hb A1C)  Result Value Ref Range   Hemoglobin A1C 7.4   POCT CBC  Result Value Ref Range   WBC 4.3 (A) 4.6 - 10.2 K/uL   Lymph, poc 1.7 0.6 - 3.4   POC LYMPH PERCENT 39.1 10 - 50 %L   MID (cbc) 0.3 0 - 0.9   POC MID % 7.0 0 - 12 %M   POC Granulocyte 2.3 2 - 6.9   Granulocyte percent 53.9 37 - 80 %G   RBC 4.32 4.04 - 5.48 M/uL   Hemoglobin 12.1 (A) 12.2 - 16.2 g/dL   HCT, POC 35.9 (A) 37.7 -  47.9 %   MCV 83.0 80 - 97 fL   MCH, POC 28.0 27 - 31.2 pg   MCHC 33.8 31.8 - 35.4 g/dL   RDW, POC 14.0 %   Platelet Count, POC 186 142 - 424 K/uL   MPV 8.5 0 - 99.8 fL  POCT glucose (  manual entry)  Result Value Ref Range   POC Glucose 102 (A) 70 - 99 mg/dl     Assessment and Plan: Anya Murphey is a 55 y.o. female who is here today for neck and back pain. This appears to be strain from overuse on deconditioned.  I have advised stretches ice, and muscle relaxant.  Discussed precautions.  rtc in 2 weeks if no improvement. HTN: advised to continue to take daily.  We will start the amlodipine.  Discussed potential for leg swelling which she will readily report.   DM2: stable.   Hemoglobin minimally anemic.  Advised iron rich diet.    Type 2 diabetes mellitus without complication, without long-term current use of insulin ([place]) - Plan: POCT glycosylated hemoglobin (Hb A1C), POCT CBC, POCT glucose (manual entry), hydrochlorothiazide (HYDRODIURIL) 25 MG tablet  Essential hypertension - Plan: POCT glycosylated hemoglobin (Hb A1C), POCT CBC, POCT glucose (manual entry), amLODipine (NORVASC) 5 MG tablet  Muscle strain - Plan: cyclobenzaprine (FLEXERIL) 5 MG tablet  Ivar Drape, PA-C [facility] 01/16/2016 6:11 PM

## 2016-03-10 ENCOUNTER — Other Ambulatory Visit: Payer: Self-pay | Admitting: Physician Assistant

## 2016-03-10 DIAGNOSIS — R232 Flushing: Secondary | ICD-10-CM

## 2016-03-10 DIAGNOSIS — I10 Essential (primary) hypertension: Secondary | ICD-10-CM

## 2016-03-10 DIAGNOSIS — E119 Type 2 diabetes mellitus without complications: Secondary | ICD-10-CM

## 2016-03-13 NOTE — Telephone Encounter (Signed)
Lindsay Travis I do not see any mention of Metformin in last office visit doesn't look like it has been prescribed for awhile?

## 2016-03-16 MED ORDER — METFORMIN HCL 500 MG PO TABS: ORAL_TABLET | ORAL | Status: DC

## 2016-03-16 NOTE — Telephone Encounter (Signed)
Instructed pt to take 2 in am and 1 hs - Metformin per [employer]. Prescription called in to [facility]

## 2016-03-16 NOTE — Telephone Encounter (Signed)
Refill sent for the 2 in the am and 1 at night.  Please inform patient.  I do not want just twice daily

## 2016-03-18 NOTE — Telephone Encounter (Signed)
Faxed in Rx which had printed.

## 2017-01-30 ENCOUNTER — Ambulatory Visit (INDEPENDENT_AMBULATORY_CARE_PROVIDER_SITE_OTHER): Payer: BLUE CROSS/BLUE SHIELD | Admitting: Physician Assistant

## 2017-01-30 ENCOUNTER — Telehealth: Payer: Self-pay | Admitting: Family Medicine

## 2017-01-30 ENCOUNTER — Encounter: Payer: Self-pay | Admitting: Physician Assistant

## 2017-01-30 VITALS — BP 164/98 | HR 74 | Temp 98.2°F | Resp 18 | Ht 65.0 in | Wt 247.1 lb

## 2017-01-30 DIAGNOSIS — R232 Flushing: Secondary | ICD-10-CM | POA: Diagnosis not present

## 2017-01-30 DIAGNOSIS — I10 Essential (primary) hypertension: Secondary | ICD-10-CM

## 2017-01-30 DIAGNOSIS — E119 Type 2 diabetes mellitus without complications: Secondary | ICD-10-CM | POA: Diagnosis not present

## 2017-01-30 DIAGNOSIS — M7711 Lateral epicondylitis, right elbow: Secondary | ICD-10-CM | POA: Diagnosis not present

## 2017-01-30 MED ORDER — METFORMIN HCL ER 750 MG PO TB24: 750.0000 mg | ORAL_TABLET | Freq: Two times a day (BID) | ORAL | 1 refills | Status: DC

## 2017-01-30 MED ORDER — HYDROCHLOROTHIAZIDE 25 MG PO TABS: ORAL_TABLET | ORAL | 1 refills | Status: DC

## 2017-01-30 NOTE — Patient Instructions (Addendum)
IF you received an x-ray today, you will receive an invoice from [facility]. Please contact [facility] at [phone] with questions or concerns regarding your invoice.   IF you received labwork today, you will receive an invoice from [place]. Please contact [facility] at [phone] with questions or concerns regarding your invoice.   Our billing staff will not be able to assist you with questions regarding bills from these companies.  You will be contacted with the lab results as soon as they are available. The fastest way to get your results is to activate your My Chart account. Instructions are located on the last page of this paperwork. If you have not heard from [place] regarding the results in 2 weeks, please contact this office.    I would like you to ice the elbow three times per day for 15 minutes. I would like you to start walking 4 times per week for 30 minutes.     Diabetes Mellitus and Food It is important for you to manage your blood sugar (glucose) level. Your blood glucose level can be greatly affected by what you eat. Eating healthier foods in the appropriate amounts throughout the day at about the same time each day will help you control your blood glucose level. It can also help slow or prevent worsening of your diabetes mellitus. Healthy eating may even help you improve the level of your blood pressure and reach or maintain a healthy weight. General recommendations for healthful eating and cooking habits include:  Eating meals and snacks regularly. Avoid going long periods of time without eating to lose weight.  Eating a diet that consists mainly of plant-based foods, such as fruits, vegetables, nuts, legumes, and whole grains.  Using low-heat cooking methods, such as baking, instead of high-heat cooking methods, such as deep frying. Work with your dietitian to make sure you understand how to use the Nutrition Facts information on food labels. How  can food affect me? Carbohydrates  Carbohydrates affect your blood glucose level more than any other type of food. Your dietitian will help you determine how many carbohydrates to eat at each meal and teach you how to count carbohydrates. Counting carbohydrates is important to keep your blood glucose at a healthy level, especially if you are using insulin or taking certain medicines for diabetes mellitus. Alcohol  Alcohol can cause sudden decreases in blood glucose (hypoglycemia), especially if you use insulin or take certain medicines for diabetes mellitus. Hypoglycemia can be a life-threatening condition. Symptoms of hypoglycemia (sleepiness, dizziness, and disorientation) are similar to symptoms of having too much alcohol. If your health care provider has given you approval to drink alcohol, do so in moderation and use the following guidelines:  Women should not have more than one drink per day, and men should not have more than two drinks per day. One drink is equal to:  12 oz of beer.  5 oz of wine.  1 oz of hard liquor.  Do not drink on an empty stomach.  Keep yourself hydrated. Have water, diet soda, or unsweetened iced tea.  Regular soda, juice, and other mixers might contain a lot of carbohydrates and should be counted. What foods are not recommended? As you make food choices, it is important to remember that all foods are not the same. Some foods have fewer nutrients per serving than other foods, even though they might have the same number of calories or carbohydrates. It is difficult to get your body what it  needs when you eat foods with fewer nutrients. Examples of foods that you should avoid that are high in calories and carbohydrates but low in nutrients include:  Trans fats (most processed foods list trans fats on the Nutrition Facts label).  Regular soda.  Juice.  Candy.  Sweets, such as cake, pie, doughnuts, and cookies.  Fried foods. What foods can I eat? Eat  nutrient-rich foods, which will nourish your body and keep you healthy. The food you should eat also will depend on several factors, including:  The calories you need.  The medicines you take.  Your weight.  Your blood glucose level.  Your blood pressure level.  Your cholesterol level. You should eat a variety of foods, including:  Protein.  Lean cuts of meat.  Proteins low in saturated fats, such as fish, egg whites, and beans. Avoid processed meats.  Fruits and vegetables.  Fruits and vegetables that may help control blood glucose levels, such as apples, mangoes, and yams.  Dairy products.  Choose fat-free or low-fat dairy products, such as milk, yogurt, and cheese.  Grains, bread, pasta, and rice.  Choose whole grain products, such as multigrain bread, whole oats, and brown rice. These foods may help control blood pressure.  Fats.  Foods containing healthful fats, such as nuts, avocado, olive oil, canola oil, and fish. Does everyone with diabetes mellitus have the same meal plan? Because every person with diabetes mellitus is different, there is not one meal plan that works for everyone. It is very important that you meet with a dietitian who will help you create a meal plan that is just right for you. This information is not intended to replace advice given to you by your health care provider. Make sure you discuss any questions you have with your health care provider. Document Released: 05/23/2005 Document Revised: 02/01/2016 Document Reviewed: 07/23/2013 Elsevier Interactive Patient Education  2017 [place] Ask your health care provider which exercises are safe for you. Do exercises exactly as told by your health care provider and adjust them as directed. It is normal to feel mild stretching, pulling, tightness, or discomfort as you do these exercises, but you should stop right away if you feel sudden pain or your pain gets worse. Do not  begin these exercises until told by your health care provider. Stretching and range of motion exercises These exercises warm up your muscles and joints and improve the movement and flexibility of your elbow. These exercises also help to relieve pain, numbness, and tingling. Exercise A: Wrist extensor stretch  Extend your left / right elbow with your fingers pointing down. Gently pull the palm of your left / right hand toward you until you feel a gentle stretch on the top of your forearm. To increase the stretch, push your left / right hand toward the outer edge or pinkie side of your forearm. Hold this position for __________ seconds. Repeat __________ times. Complete this exercise __________ times a day. If directed by your health care provider, repeat this stretch except do it with a bent elbow this time. Exercise B: Wrist flexor stretch   Extend your left / right elbow and turn your palm upward. Gently pull your left / right palm and fingertips back so your wrist extends and your fingers point more toward the ground. You should feel a gentle stretch on the inside of your forearm. Hold this position for __________ seconds. Repeat __________ times. Complete this exercise __________ times a day. If  directed by your health care provider, repeat this stretch except do it with a bent elbow this time. Strengthening exercises These exercises build strength and endurance in your elbow. Endurance is the ability to use your muscles for a long time, even after they get tired. Exercise C: Wrist extensors   Sit with your left / right forearm palm-down and fully supported on a table or countertop. Your elbow should be resting below the height of your shoulder. Let your left / right wrist extend over the edge of the surface. Loosely hold a __________ weight or a piece of rubber exercise band or tubing in your left / right hand. Slowly curl your left / right hand up toward your forearm. If you are using  band or tubing, hold the band or tubing in place with your other hand to provide resistance. Hold this position for __________ seconds. Slowly return to the starting position. Repeat __________ times. Complete this exercise __________ times a day. Exercise D: Radial deviators   Stand with a __________ weight in your left / righthand. Or, sit while holding a rubber exercise band or tubing with your other arm supported on a table or countertop. Position your hand so your thumb is on top. Raise your hand upward in front of you so your thumb travels toward your forearm, or pull up on the rubber tubing. Hold this position for __________ seconds. Slowly return to the starting position. Repeat __________ times. Complete this exercise __________ times a day. Exercise E: Eccentric wrist extensors  Sit with your left / right forearm palm-down and fully supported on a table or countertop. Your elbow should be resting below the height of your shoulder. If told by your health care provider, hold a __________ weight in your hand. Let your left / right wrist extend over the edge of the surface. Use your other hand to lift up your left / right hand toward your forearm. Keep your forearm on the table. Using only the muscles in your left / right hand, slowly lower your hand back down to the starting position. Repeat __________ times. Complete this exercise __________ times a day. This information is not intended to replace advice given to you by your health care provider. Make sure you discuss any questions you have with your health care provider. Document Released: 08/26/2005 Document Revised: 05/01/2016 Document Reviewed: 05/25/2015 Elsevier Interactive Patient Education  2017 [employer].   Complicated Grieving Grief is a normal response to the death of someone close to you. Feelings of fear, anger, and guilt can affect almost everyone who loses a loved one. It is also common to have symptoms of  depression while you are grieving. These include problems with sleep, loss of appetite, and lack of energy. They may last for weeks or months after a loss. Complicated grief is different from normal grief or depression. Normal grieving involves sadness and feelings of loss, but these feelings are not constant. Complicated grief is a constant and severe type of grief. It interferes with your ability to function normally. It may last for several months to a year or longer. Complicated grief may require treatment from a mental health care provider. What are the causes? It is not known why some people continue to struggle with grief and others do not. You may be at higher risk for complicated grief if:  The death of your loved one was sudden or unexpected.  The death of your loved one was due to a violent event.  Your  loved one committed suicide.  Your loved one was a child or a young person.  You were very close to or dependent on the loved one.  You have a history of depression. What are the signs or symptoms? Signs and symptoms of complicated grief may include:  Feeling disbelief or numbness.  Being unable to enjoy good memories of your loved one.  Needing to avoid anything that reminds you of your loved one.  Being unable to stop thinking about the death.  Feeling intense anger or guilt.  Feeling alone and hopeless.  Feeling that your life is meaningless and empty.  Losing the desire to live. How is this diagnosed? Your health care provider may diagnose complicated grief if:  You have constant symptoms of grief for 6-12 months or longer.  Your symptoms are interfering with your ability to live your life. Your health care provider may want you to see a mental health care provider. Many symptoms of depression are similar to the symptoms of complicated grief. It is important to be evaluated for complicated grief along with other mental health conditions. How is this  treated? Talk therapy with a mental health provider is the most common treatment for complicated grief. During therapy, you will learn healthy ways to cope with the loss of your loved one. In some cases, your mental health care provider may also recommend antidepressant medicines. Follow these instructions at home:  Take care of yourself.  Eat regular meals and maintain a healthy diet. Eat plenty of fruits, vegetables, and whole grains.  Try to get some exercise each day.  Keep regular hours for sleep. Try to get at least 8 hours of sleep each night.  Do not use drugs or alcohol to ease your symptoms.  Take medicines only as directed by your health care provider.  Spend time with friends and loved ones.  Consider joining a grief (bereavement) support group to help you deal with your loss.  Keep all follow-up visits as directed by your health care provider. This is important. Contact a health care provider if:  Your symptoms keep you from functioning normally.  Your symptoms do not get better with treatment. Get help right away if:  You have serious thoughts of hurting yourself or someone else.  You have suicidal feelings. This information is not intended to replace advice given to you by your health care provider. Make sure you discuss any questions you have with your health care provider. Document Released: 08/26/2005 Document Revised: 02/01/2016 Document Reviewed: 02/03/2014 Elsevier Interactive Patient Education  2017 [employer].

## 2017-01-30 NOTE — Progress Notes (Signed)
PRIMARY CARE AT [place], [place] [address] [phone]  Date:  01/30/2017   Name:  Lindsay Travis   DOB:  1960-11-27   MRN:  [id number]  PCP:  Nolene Ebbs, MD     History of Present Illness:  Lindsay Travis is a 56 y.o. female patient who presents to PCP with complaint of elbow pain, insomnia, and blood pressure recheck.  Elbow started 3 weeks ago where she had a cramp in her hand that radiated to the elbow.  She feels the pain progressively worsening.  She has no swelling.  She is right hand dominant.  No weakness.    She is not sleeping well, for about 1 month.  She has no bedtime.  She is not playing with phone, or watching tv.  She has no eating schedule.  She has no difficulty getting to sleep.  No sleep apnea history or waking up gasping.   She reports that she has been stressed with the death of her son less than 6 months ago.  Sudden death by murder when he returned to his home country.  She states that she thinks of him, but she denies si/hi.  She reports that she continues with the same appetite, interest, and denies agitation.  She has support of family and is very spiritual.   Blood pressure she is currently taking hctz daily.  She has had adverse effects with an ARB and ace-I.  She states that she was having trouble with taking the amlodipine because it may be keeping her awake.      Patient Active Problem List   Diagnosis Date Noted  . Controlled diabetes mellitus type II without complication ([place]) 05/69/7948  . Bilateral knee pain 05/26/2012  . Varicose veins of lower extremities with other complications 01/65/5374  . Fibroid, uterus 04/18/2011  . Metrorrhagia 04/18/2011  . Hypertension 04/18/2011    Past Medical History:  Diagnosis Date  . Allergy   . Anemia   . Clotting disorder ([place])   . Diabetes mellitus    recent high blood sugar and has RX but not taking  med -b/c she doesn't think she  is diabetic, just had lots of sugar in her diet  when  dx'd.  [place] Kitchen Hypertension   . Shortness of breath    on exertion- has low hgb    Past Surgical History:  Procedure Laterality Date  . ABDOMINAL HYSTERECTOMY  09/11/2011   Procedure: HYSTERECTOMY ABDOMINAL;  Surgeon: Frederico Hamman, MD;  Location: [facility];  Service: Gynecology;  Laterality: N/A;  . NO PAST SURGERIES      Social History  Substance Use Topics  . Smoking status: Never Smoker  . Smokeless tobacco: Never Used  . Alcohol use No    Family History  Problem Relation Age of Onset  . Hypertension Other     Allergies  Allergen Reactions  . Lisinopril Other (See Comments)    Did not feel good  . Losartan Other (See Comments)    headache  . Aspirin Other (See Comments)    Abdominal pain    Medication list has been reviewed and updated.  Current Outpatient Prescriptions on File Prior to Visit  Medication Sig Dispense Refill  . amLODipine (NORVASC) 5 MG tablet Take 1 tablet (5 mg total) by mouth daily. 90 tablet 3  . blood glucose meter kit and supplies KIT Dispense based on patient and insurance preference. Use up to four times daily as directed. (FOR ICD-9 250.00, 250.01). 1  each 0  . cyclobenzaprine (FLEXERIL) 5 MG tablet Take 1-2 tablets (5-10 mg total) by mouth 3 (three) times daily as needed for muscle spasms. 30 tablet 0  . hydrochlorothiazide (HYDRODIURIL) 25 MG tablet TAKE ONE TABLET BY MOUTH DAILY WITH BREAKFAST.  "OFFICE VISIT NEEDED FOR REFILLS" 30 tablet 5  . ibuprofen (ADVIL,MOTRIN) 200 MG tablet Take 200 mg by mouth every 6 (six) hours as needed.    . metFORMIN (GLUCOPHAGE) 500 MG tablet Take 2 tabs PO in the AM, then take 1 tab PO in the PM 270 tablet 1  . methocarbamol (ROBAXIN-750) 750 MG tablet Take 1 tablet (750 mg total) by mouth 4 (four) times daily. 40 tablet 1  . olmesartan (BENICAR) 40 MG tablet Take 1 tablet (40 mg total) by mouth daily. 30 tablet 5  . valsartan (DIOVAN) 40 MG tablet Take 1 tablet (40 mg total) by mouth daily. 30 tablet 1    No current facility-administered medications on file prior to visit.     ROS ROS otherwise unremarkable unless listed above.  Physical Examination: BP (!) 164/98   Pulse 74   Temp 98.2 F (36.8 C) (Oral)   Resp 18   Ht '5\' 5"'  (1.651 m)   Wt 247 lb 1.6 oz (112.1 kg)   LMP 09/10/2011   SpO2 100%   BMI 41.12 kg/m  Ideal Body Weight: Weight in (lb) to have BMI = 25: 149.9  Physical Exam  Constitutional: She is oriented to person, place, and time. She appears well-developed and well-nourished. No distress.  HENT:  Head: Normocephalic and atraumatic.  Right Ear: External ear normal.  Left Ear: External ear normal.  Eyes: Conjunctivae and EOM are normal. Pupils are equal, round, and reactive to light.  Cardiovascular: Normal rate and regular rhythm.  Exam reveals no gallop and no friction rub.   No murmur heard. Pulses:      Carotid pulses are 2+ on the right side, and 2+ on the left side.      Radial pulses are 2+ on the right side, and 2+ on the left side.  Pulmonary/Chest: Effort normal. No respiratory distress.  Musculoskeletal:       Right elbow: She exhibits normal range of motion. Tenderness found. Lateral epicondyle tenderness noted.  Neurological: She is alert and oriented to person, place, and time.  Skin: She is not diaphoretic.  Psychiatric: She has a normal mood and affect. Her behavior is normal.     Assessment and Plan: Lindsay Travis is a 56 y.o. female who is here today for elbow pain, insomnia, and bp recheck. Continue blood pressure medication Elbow with nsaid, icing htn refill 25 mg Type 2 diabetes mellitus without complication, without long-term current use of insulin (HCC) - Plan: hydrochlorothiazide (HYDRODIURIL) 25 MG tablet, metFORMIN (GLUCOPHAGE-XR) 750 MG 24 hr tablet, CMP14+EGFR  Essential hypertension - Plan: hydrochlorothiazide (HYDRODIURIL) 25 MG tablet, CMP14+EGFR  Hot flashes  Lateral epicondylitis of right elbow  Ivar Drape,  PA-C [facility] 5/27/20188:15 AM

## 2017-01-31 LAB — CMP14+EGFR
ALK PHOS: 68 IU/L (ref 39–117)
ALT: 21 IU/L (ref 0–32)
AST: 15 IU/L (ref 0–40)
Albumin/Globulin Ratio: 1.6 (ref 1.2–2.2)
Albumin: 4.3 g/dL (ref 3.5–5.5)
BUN/Creatinine Ratio: 17 (ref 9–23)
BUN: 17 mg/dL (ref 6–24)
Bilirubin Total: 0.3 mg/dL (ref 0.0–1.2)
CO2: 24 mmol/L (ref 18–29)
CREATININE: 0.98 mg/dL (ref 0.57–1.00)
Calcium: 9.6 mg/dL (ref 8.7–10.2)
Chloride: 107 mmol/L — ABNORMAL HIGH (ref 96–106)
GFR calc Af Amer: 76 mL/min/{1.73_m2} (ref >59)
GFR calc non Af Amer: 66 mL/min/{1.73_m2} (ref >59)
GLUCOSE: 145 mg/dL — AB (ref 65–99)
Globulin, Total: 2.7 g/dL (ref 1.5–4.5)
Potassium: 4.3 mmol/L (ref 3.5–5.2)
SODIUM: 145 mmol/L — AB (ref 134–144)
Total Protein: 7 g/dL (ref 6.0–8.5)

## 2017-02-12 DIAGNOSIS — R351 Nocturia: Secondary | ICD-10-CM | POA: Diagnosis not present

## 2017-02-12 DIAGNOSIS — R3129 Other microscopic hematuria: Secondary | ICD-10-CM | POA: Diagnosis not present

## 2017-10-20 ENCOUNTER — Other Ambulatory Visit: Payer: Self-pay | Admitting: Physician Assistant

## 2017-10-20 DIAGNOSIS — I10 Essential (primary) hypertension: Secondary | ICD-10-CM

## 2017-10-20 DIAGNOSIS — E119 Type 2 diabetes mellitus without complications: Secondary | ICD-10-CM

## 2017-12-10 ENCOUNTER — Encounter: Payer: Self-pay | Admitting: Physician Assistant

## 2017-12-19 ENCOUNTER — Encounter: Payer: Self-pay | Admitting: Urgent Care

## 2017-12-19 ENCOUNTER — Ambulatory Visit (INDEPENDENT_AMBULATORY_CARE_PROVIDER_SITE_OTHER): Payer: BLUE CROSS/BLUE SHIELD | Admitting: Urgent Care

## 2017-12-19 VITALS — BP 142/88 | HR 72 | Temp 98.1°F | Resp 16 | Ht 65.0 in | Wt 242.0 lb

## 2017-12-19 DIAGNOSIS — M1711 Unilateral primary osteoarthritis, right knee: Secondary | ICD-10-CM

## 2017-12-19 DIAGNOSIS — E119 Type 2 diabetes mellitus without complications: Secondary | ICD-10-CM | POA: Diagnosis not present

## 2017-12-19 DIAGNOSIS — M25561 Pain in right knee: Secondary | ICD-10-CM | POA: Diagnosis not present

## 2017-12-19 LAB — GLUCOSE, POCT (MANUAL RESULT ENTRY): POC Glucose: 109 mg/dl — AB (ref 70–99)

## 2017-12-19 LAB — POCT GLYCOSYLATED HEMOGLOBIN (HGB A1C): Hemoglobin A1C: 8.6

## 2017-12-19 MED ORDER — BLOOD GLUCOSE MONITOR KIT: PACK | 0 refills | Status: DC

## 2017-12-19 MED ORDER — METHYLPREDNISOLONE ACETATE 80 MG/ML IJ SUSP
80.0000 mg | Freq: Once | INTRAMUSCULAR | Status: AC
  Administered 2017-12-19: 80 mg via INTRAMUSCULAR

## 2017-12-19 NOTE — Patient Instructions (Addendum)
Arthritis Arthritis is a term that is commonly used to refer to joint pain or joint disease. There are more than 100 types of arthritis. What are the causes? The most common cause of this condition is wear and tear of a joint. Other causes include:  Gout.  Inflammation of a joint.  An infection of a joint.  Sprains and other injuries near the joint.  A drug reaction or allergic reaction.  In some cases, the cause may not be known. What are the signs or symptoms? The main symptom of this condition is pain in the joint with movement. Other symptoms include:  Redness, swelling, or stiffness at a joint.  Warmth coming from the joint.  Fever.  Overall feeling of illness.  How is this diagnosed? This condition may be diagnosed with a physical exam and tests, including:  Blood tests.  Urine tests.  Imaging tests, such as MRI, X-rays, or a CT scan.  Sometimes, fluid is removed from a joint for testing. How is this treated? Treatment for this condition may involve:  Treatment of the cause, if it is known.  Rest.  Raising (elevating) the joint.  Applying cold or hot packs to the joint.  Medicines to improve symptoms and reduce inflammation.  Injections of a steroid such as cortisone into the joint to help reduce pain and inflammation.  Depending on the cause of your arthritis, you may need to make lifestyle changes to reduce stress on your joint. These changes may include exercising more and losing weight. Follow these instructions at home: Medicines  Take over-the-counter and prescription medicines only as told by your health care provider.  Do not take aspirin to relieve pain if gout is suspected. Activity  Rest your joint if told by your health care provider. Rest is important when your disease is active and your joint feels painful, swollen, or stiff.  Avoid activities that make the pain worse. It is important to balance activity with rest.  Exercise your  joint regularly with range-of-motion exercises as told by your health care provider. Try doing low-impact exercise, such as: ? Swimming. ? Water aerobics. ? Biking. ? Walking. Joint Care   If your joint is swollen, keep it elevated if told by your health care provider.  If your joint feels stiff in the morning, try taking a warm shower.  If directed, apply heat to the joint. If you have diabetes, do not apply heat without permission from your health care provider. ? Put a towel between the joint and the hot pack or heating pad. ? Leave the heat on the area for 20-30 minutes.  If directed, apply ice to the joint: ? Put ice in a plastic bag. ? Place a towel between your skin and the bag. ? Leave the ice on for 20 minutes, 2-3 times per day.  Keep all follow-up visits as told by your health care provider. This is important. Contact a health care provider if:  The pain gets worse.  You have a fever. Get help right away if:  You develop severe joint pain, swelling, or redness.  Many joints become painful and swollen.  You develop severe back pain.  You develop severe weakness in your leg.  You cannot control your bladder or bowels. This information is not intended to replace advice given to you by your health care provider. Make sure you discuss any questions you have with your health care provider. Document Released: 10/03/2004 Document Revised: 02/01/2016 Document Reviewed: 11/21/2014 Elsevier Interactive Patient   Education  2018 [employer]     IF you received an x-ray today, you will receive an invoice from [facility]. Please contact [facility] at [phone] with questions or concerns regarding your invoice.   IF you received labwork today, you will receive an invoice from [facility]. Please contact [facility] at [phone] with questions or concerns regarding your invoice.   Our billing staff will not be able to assist you with questions  regarding bills from these companies.  You will be contacted with the lab results as soon as they are available. The fastest way to get your results is to activate your My Chart account. Instructions are located on the last page of this paperwork. If you have not heard from us regarding the results in 2 weeks, please contact this office.      

## 2017-12-19 NOTE — Progress Notes (Signed)
    MRN: [id number] DOB: 09/03/1961  Subjective:   Lindsay Travis is a 57 y.o. female presenting for a week history of recurrent right knee pain.  She has a very difficult time bending her leg, walking and bearing weight on her right knee.  Patient has known history of arthritis of her knee, used to receive steroid injections but was advised to stop this about 4 or 5 years ago.  She currently does not have an orthopedist.  She denies falls, trauma, redness, swelling, warmth.  He has type 2 diabetes, reports that her blood sugar is 140s recently.  Lindsay Travis has a current medication list which includes the following prescription(s): amlodipine, blood glucose meter kit and supplies, cyclobenzaprine, hydrochlorothiazide, ibuprofen, metformin, methocarbamol, olmesartan, and valsartan. Also is allergic to lisinopril; losartan; and aspirin.  Lindsay Travis  has a past medical history of Allergy, Anemia, Clotting disorder ([place]), Diabetes mellitus, Hypertension, and Shortness of breath. Also  has a past surgical history that includes No past surgeries and Abdominal hysterectomy (09/11/2011).  Objective:   Vitals: BP (!) 168/98   Pulse 72   Temp 98.1 F (36.7 C) (Oral)   Resp 16   Ht '5\' 5"'$  (1.651 m)   Wt 242 lb (109.8 kg)   LMP 09/10/2011   SpO2 98%   BMI 40.27 kg/m   BP Readings from Last 3 Encounters:  12/19/17 (!) 168/98  01/30/17 (!) 164/98  01/16/16 (!) 130/100    Physical Exam  Constitutional: She is oriented to person, place, and time. She appears well-developed and well-nourished.  Cardiovascular: Normal rate.  Pulmonary/Chest: Effort normal.  Musculoskeletal:       Right knee: She exhibits decreased range of motion (flexion, extension) and swelling (trace). She exhibits no effusion, no ecchymosis, no deformity, no erythema, normal alignment and normal patellar mobility. No tenderness found.  Neurological: She is alert and oriented to person, place, and time.   Results for orders placed or  performed in visit on 12/19/17 (from the past 24 hour(s))  POCT glucose (manual entry)     Status: Abnormal   Collection Time: 12/19/17  5:04 PM  Result Value Ref Range   POC Glucose 109 (A) 70 - 99 mg/dl  POCT glycosylated hemoglobin (Hb A1C)     Status: None   Collection Time: 12/19/17  5:08 PM  Result Value Ref Range   Hemoglobin A1C 8.6     Assessment and Plan :   Acute pain of right knee - Plan: methylPREDNISolone acetate (DEPO-MEDROL) injection 80 mg, Ambulatory referral to Orthopedic Surgery  Arthritis of right knee - Plan: methylPREDNISolone acetate (DEPO-MEDROL) injection 80 mg, Ambulatory referral to Orthopedic Surgery  Morbid obesity ([place])  Type 2 diabetes mellitus without complication, without long-term current use of insulin ([place]) - Plan: POCT glucose (manual entry), POCT glycosylated hemoglobin (Hb A1C)  Will refer patient to [facility] for consult on her bilateral knee arthritis.  We used a IM injection today of Depo-Medrol. Counseled patient on potential for adverse effects with medications prescribed today, patient verbalized understanding.  Requested a prescription for new glucometer, this was provided to patient.  Jaynee Eagles, PA-C [facility] [phone] 12/19/2017  4:34 PM

## 2017-12-31 ENCOUNTER — Ambulatory Visit: Payer: BLUE CROSS/BLUE SHIELD | Admitting: Family Medicine

## 2017-12-31 ENCOUNTER — Ambulatory Visit
Admission: RE | Admit: 2017-12-31 | Discharge: 2017-12-31 | Disposition: A | Discharge status: Home / Self Care | Payer: BLUE CROSS/BLUE SHIELD | Source: Ambulatory Visit | Attending: Family Medicine | Admitting: Family Medicine

## 2017-12-31 VITALS — BP 178/104 | Ht 65.0 in | Wt 242.0 lb

## 2017-12-31 DIAGNOSIS — M1711 Unilateral primary osteoarthritis, right knee: Secondary | ICD-10-CM | POA: Diagnosis not present

## 2017-12-31 DIAGNOSIS — M25562 Pain in left knee: Secondary | ICD-10-CM

## 2017-12-31 DIAGNOSIS — G8929 Other chronic pain: Secondary | ICD-10-CM

## 2017-12-31 DIAGNOSIS — M25561 Pain in right knee: Secondary | ICD-10-CM

## 2017-12-31 DIAGNOSIS — M1712 Unilateral primary osteoarthritis, left knee: Secondary | ICD-10-CM | POA: Diagnosis not present

## 2017-12-31 MED ORDER — METHYLPREDNISOLONE ACETATE 40 MG/ML IJ SUSP
40.0000 mg | Freq: Once | INTRAMUSCULAR | Status: AC
  Administered 2017-12-31: 40 mg via INTRA_ARTICULAR

## 2017-12-31 NOTE — Progress Notes (Signed)
Chief complaint: Bilateral knee pain x3 weeks, proximal bilateral leg pain x 3 weeks  History of present illness: Lindsay Travis is a 57 year old female who presents to the sports medicine office today with chief complaint of bilateral knee pain.  She reports that her right knee is worse than her left knee.  She reports the symptoms have been ongoing for about 3 weeks now.  She does not report of any specific inciting incident, trauma, injury to explain the pain.  She reports having pain along the anterior aspect of both knees, as well as along the sides of both knees.  She describes the pain as a throbbing and aching pain.  She reports feeling associated pain along the anterolateral quadriceps and IT band.  She does not report of any numbness, tingling, or burning paresthesias radiating from back down to her feet or ankles.  She has been seen here previously, last about 5 years ago, for bilateral knee pain.  X-rays were done at that time which did show moderate tricompartmental osteoarthritis of both knees.  She did have cortisone injection, which did give her significant relief for 5 years.  Unfortunately, over the last 3 weeks she has had gradual worsening of bilateral knee pain.  She went to a local urgent care office a few weeks ago and had IM Depo-medrol injection.  She does report of slight improvement in her symptoms.  She has not tried any other medications for pain.  Symptoms otherwise do not wake her up from sleep at nighttime.  She does report of occasional popping, but no locking, catching, or symptoms of instability.  She does not report of any warmth, erythema, ecchymosis, or effusion.  Review of systems:  As stated above  Her past medical history, surgical history, family history, and social history obtained and reviewed.  Past medical history is notable for hypertension, type 2 diabetes; surgical history notable for abdominal hysterectomy; does not report of any current tobacco use; family  history notable for hypertension; allergies and medications are reviewed and are reflected in EMR.  Physical exam: Vital signs are reviewed and are documented in the chart Gen.: Alert, oriented, appears stated age, in no apparent distress HEENT: Moist oral mucosa Respiratory: Normal respirations, able to speak in full sentences Cardiac: Regular rate, distal pulses 2+ Integumentary: No rashes on visible skin:  Neurologic: Strength 5/5 with bilateral knee flexion and knee extension, she does have slight weakness with hip flexion and hip abduction on both sides, with category strength is 4+/5, sensation 2+ bilateral lower extremities Psych: Normal affect, mood is described as good Musculoskeletal: Inspection of bilateral knees reveal no obvious deformity or muscle atrophy, no warmth, erythema, or ecchymosis noted, trace suprapatellar effusion noted, she is tender palpation along both the medial and lateral joint lines of both knees, no signs of ligamentous instability of either knee as Lachman, anterior drawer, valgus, and varus stress testing negative, McMurray positive for pain and crepitus, does not walk with an antalgic gait, does have slightly decreased range of motion in the right knee compared to the left knee, range of motion of the right knee is from 3 degrees to 110 degrees, left knee is from 0 degrees to 135 degrees  Procedure:  After written informed consent signed and obtained, and benefit of pain relief and risk of bleeding, infection, and steroid flare discussed, Lindsay Travis  agreed to proceed with cortisone injection into the right knee . After timeout, area  was cleaned with Betadine and alcohol wipes. Ethyl chloride was  used as topical anesthetic spray. Using 3 cc 1% lidocaine without epinephrine and 1 cc of 40 mg Depo-Medrol on a 22-gauge 1.5 inch needle, the right knee was injected. She did not have any bleeding afterwards. No complications noted from procedure.  Procedure:  After  written informed consent signed and obtained, and benefit of pain relief and risk of bleeding, infection, and steroid flare discussed, Lindsay Travis  agreed to proceed with cortisone injection into the left knee . After timeout, area  was cleaned with Betadine and alcohol wipes. Ethyl chloride was used as topical anesthetic spray. Using 3 cc 1% lidocaine without epinephrine and 1 cc of 40 mg Depo-Medrol on a 22-gauge 1.5 inch needle, the left knee was injected. She did not have any bleeding afterwards. No complications noted from procedure.  Assessment and plan: 1.  Bilateral knee pain, suspect aggravation of pre-existing osteoarthritis of known tricompartmental osteoarthritis 2.  History of type 2 diabetes  Plan: Discussed with Lindsay Travis today that most likely her pain generators are from aggravation of pre-existing osteoarthritis in both of her knees.  Discussed option of cortisone injection in the office today, since this did give her relief last time.  She is agreeable to this.  Injections done as noted above without any complications noted. Since it has been at least 5 years since last x-ray was done, would be worthwhile to get repeat x-rays of both of her knees.  She is agreeable to this.  Will call her after x-ray results.  Discussed option of Visco supplementation if she would like.  She would like to wait and see how the cortisone injection does first.  She will call the office if she would like to pursue Visco supplementation with one of our sports medicine partners.  Otherwise follow-up on as-needed basis in 4 weeks if no better.   Mort Sawyers, M.D. [facility]

## 2018-01-02 ENCOUNTER — Telehealth: Payer: Self-pay | Admitting: Family Medicine

## 2018-01-02 NOTE — Telephone Encounter (Signed)
Called and spoke with Lindsay Travis on the phone today at 1636 to go over x-ray results of both of her knees.  Since last x-ray back in 2013, she has had slight progression of tricompartmental osteoarthritis.  Still, this is not going to change management at this time.  She reports that her left knee is feeling better, still has some pain in her right knee.  Discussed that she can continue to take over-the-counter ibuprofen, Motrin, or Aleve.  She reports that the Motrin does control her pain.  Discussed if she wants me to prescribe a prescription strength NSAID medication in the future, this is certainly a reasonable option.  Discussed that she is not having any interval improvement in symptoms in the next 3 weeks to return to office for reevaluation.  Otherwise, she will follow-up on as-needed basis.    Mort Sawyers, MD Primary Care Sports Medicine Fellow [facility]

## 2018-01-09 ENCOUNTER — Ambulatory Visit: Payer: BLUE CROSS/BLUE SHIELD | Admitting: Family Medicine

## 2018-01-09 ENCOUNTER — Observation Stay (HOSPITAL_COMMUNITY): Payer: BLUE CROSS/BLUE SHIELD

## 2018-01-09 ENCOUNTER — Observation Stay (HOSPITAL_COMMUNITY)
Admission: AD | Admit: 2018-01-09 | Discharge: 2018-01-11 | Disposition: A | Discharge status: Home / Self Care | Payer: BLUE CROSS/BLUE SHIELD | Source: Ambulatory Visit | Attending: Family Medicine | Admitting: Family Medicine

## 2018-01-09 VITALS — BP 169/105 | Ht 65.0 in | Wt 242.0 lb

## 2018-01-09 DIAGNOSIS — M25562 Pain in left knee: Secondary | ICD-10-CM | POA: Insufficient documentation

## 2018-01-09 DIAGNOSIS — Z7902 Long term (current) use of antithrombotics/antiplatelets: Secondary | ICD-10-CM | POA: Diagnosis not present

## 2018-01-09 DIAGNOSIS — E119 Type 2 diabetes mellitus without complications: Secondary | ICD-10-CM | POA: Insufficient documentation

## 2018-01-09 DIAGNOSIS — Z888 Allergy status to other drugs, medicaments and biological substances status: Secondary | ICD-10-CM | POA: Insufficient documentation

## 2018-01-09 DIAGNOSIS — I6330 Cerebral infarction due to thrombosis of unspecified cerebral artery: Secondary | ICD-10-CM | POA: Diagnosis not present

## 2018-01-09 DIAGNOSIS — R29898 Other symptoms and signs involving the musculoskeletal system: Secondary | ICD-10-CM

## 2018-01-09 DIAGNOSIS — Z886 Allergy status to analgesic agent status: Secondary | ICD-10-CM | POA: Diagnosis not present

## 2018-01-09 DIAGNOSIS — R262 Difficulty in walking, not elsewhere classified: Secondary | ICD-10-CM | POA: Diagnosis not present

## 2018-01-09 DIAGNOSIS — M47812 Spondylosis without myelopathy or radiculopathy, cervical region: Secondary | ICD-10-CM | POA: Diagnosis not present

## 2018-01-09 DIAGNOSIS — R2681 Unsteadiness on feet: Secondary | ICD-10-CM | POA: Insufficient documentation

## 2018-01-09 DIAGNOSIS — Z79899 Other long term (current) drug therapy: Secondary | ICD-10-CM | POA: Diagnosis not present

## 2018-01-09 DIAGNOSIS — R531 Weakness: Secondary | ICD-10-CM | POA: Diagnosis not present

## 2018-01-09 DIAGNOSIS — Z7982 Long term (current) use of aspirin: Secondary | ICD-10-CM | POA: Diagnosis not present

## 2018-01-09 DIAGNOSIS — M25561 Pain in right knee: Secondary | ICD-10-CM | POA: Diagnosis not present

## 2018-01-09 DIAGNOSIS — E118 Type 2 diabetes mellitus with unspecified complications: Secondary | ICD-10-CM | POA: Diagnosis not present

## 2018-01-09 DIAGNOSIS — I639 Cerebral infarction, unspecified: Secondary | ICD-10-CM | POA: Diagnosis not present

## 2018-01-09 DIAGNOSIS — I10 Essential (primary) hypertension: Secondary | ICD-10-CM | POA: Diagnosis not present

## 2018-01-09 DIAGNOSIS — R269 Unspecified abnormalities of gait and mobility: Secondary | ICD-10-CM | POA: Diagnosis not present

## 2018-01-09 DIAGNOSIS — E785 Hyperlipidemia, unspecified: Secondary | ICD-10-CM

## 2018-01-09 DIAGNOSIS — M47814 Spondylosis without myelopathy or radiculopathy, thoracic region: Secondary | ICD-10-CM | POA: Diagnosis not present

## 2018-01-09 DIAGNOSIS — M5124 Other intervertebral disc displacement, thoracic region: Secondary | ICD-10-CM | POA: Diagnosis not present

## 2018-01-09 DIAGNOSIS — E1165 Type 2 diabetes mellitus with hyperglycemia: Secondary | ICD-10-CM

## 2018-01-09 DIAGNOSIS — M50223 Other cervical disc displacement at C6-C7 level: Secondary | ICD-10-CM | POA: Diagnosis not present

## 2018-01-09 LAB — CBC
HCT: 39.6 % (ref 36.0–46.0)
Hemoglobin: 13.2 g/dL (ref 12.0–15.0)
MCH: 28 pg (ref 26.0–34.0)
MCHC: 33.3 g/dL (ref 30.0–36.0)
MCV: 84.1 fL (ref 78.0–100.0)
PLATELETS: 226 10*3/uL (ref 150–400)
RBC: 4.71 MIL/uL (ref 3.87–5.11)
RDW: 14.5 % (ref 11.5–15.5)
WBC: 4 10*3/uL (ref 4.0–10.5)

## 2018-01-09 LAB — COMPREHENSIVE METABOLIC PANEL
ALBUMIN: 3.6 g/dL (ref 3.5–5.0)
ALT: 23 U/L (ref 14–54)
AST: 17 U/L (ref 15–41)
Alkaline Phosphatase: 61 U/L (ref 38–126)
Anion gap: 9 (ref 5–15)
BILIRUBIN TOTAL: 0.6 mg/dL (ref 0.3–1.2)
BUN: 13 mg/dL (ref 6–20)
CO2: 27 mmol/L (ref 22–32)
Calcium: 9.4 mg/dL (ref 8.9–10.3)
Chloride: 104 mmol/L (ref 101–111)
Creatinine, Ser: 0.66 mg/dL (ref 0.44–1.00)
GFR calc Af Amer: >60 mL/min (ref >60)
GFR calc non Af Amer: >60 mL/min (ref >60)
GLUCOSE: 159 mg/dL — AB (ref 65–99)
POTASSIUM: 3.7 mmol/L (ref 3.5–5.1)
Sodium: 140 mmol/L (ref 135–145)
TOTAL PROTEIN: 7.5 g/dL (ref 6.5–8.1)

## 2018-01-09 LAB — GLUCOSE, CAPILLARY
GLUCOSE-CAPILLARY: 182 mg/dL — AB (ref 65–99)
Glucose-Capillary: 156 mg/dL — ABNORMAL HIGH (ref 65–99)
Glucose-Capillary: 138 mg/dL — ABNORMAL HIGH (ref 65–99)

## 2018-01-09 LAB — HEMOGLOBIN A1C
Hgb A1c MFr Bld: 8 % — ABNORMAL HIGH (ref 4.8–5.6)
Mean Plasma Glucose: 182.9 mg/dL

## 2018-01-09 LAB — CK: Total CK: 80 U/L (ref 38–234)

## 2018-01-09 LAB — TSH: TSH: 2.317 u[IU]/mL (ref 0.350–4.500)

## 2018-01-09 LAB — SEDIMENTATION RATE: Sed Rate: 21 mm/hr (ref 0–22)

## 2018-01-09 LAB — VITAMIN B12: Vitamin B-12: 1790 pg/mL — ABNORMAL HIGH (ref 180–914)

## 2018-01-09 LAB — FOLATE: FOLATE: 22.2 ng/mL (ref >5.9)

## 2018-01-09 MED ORDER — INSULIN ASPART 100 UNIT/ML ~~LOC~~ SOLN
0.0000 [IU] | Freq: Three times a day (TID) | SUBCUTANEOUS | Status: DC
  Administered 2018-01-09 – 2018-01-11 (×5): 2 [IU] via SUBCUTANEOUS

## 2018-01-09 MED ORDER — ACETAMINOPHEN 325 MG PO TABS
650.0000 mg | ORAL_TABLET | Freq: Four times a day (QID) | ORAL | Status: DC | PRN
  Administered 2018-01-11 (×2): 650 mg via ORAL
  Filled 2018-01-09 (×2): qty 2

## 2018-01-09 MED ORDER — ENOXAPARIN SODIUM 40 MG/0.4ML ~~LOC~~ SOLN
40.0000 mg | SUBCUTANEOUS | Status: DC
  Administered 2018-01-09 – 2018-01-11 (×3): 40 mg via SUBCUTANEOUS
  Filled 2018-01-09 (×3): qty 0.4

## 2018-01-09 MED ORDER — HYDRALAZINE HCL 20 MG/ML IJ SOLN
10.0000 mg | INTRAMUSCULAR | Status: DC | PRN
  Administered 2018-01-10 – 2018-01-11 (×2): 10 mg via INTRAVENOUS
  Filled 2018-01-09 (×2): qty 1

## 2018-01-09 MED ORDER — HYDROCHLOROTHIAZIDE 25 MG PO TABS
25.0000 mg | ORAL_TABLET | Freq: Every day | ORAL | Status: DC
  Administered 2018-01-09 – 2018-01-11 (×3): 25 mg via ORAL
  Filled 2018-01-09 (×3): qty 1

## 2018-01-09 MED ORDER — ACETAMINOPHEN 650 MG RE SUPP: 650.0000 mg | Freq: Four times a day (QID) | RECTAL | Status: DC | PRN

## 2018-01-09 MED ORDER — ATORVASTATIN CALCIUM 40 MG PO TABS
40.0000 mg | ORAL_TABLET | Freq: Every day | ORAL | Status: DC
  Administered 2018-01-10: 40 mg via ORAL
  Filled 2018-01-09: qty 1

## 2018-01-09 NOTE — Progress Notes (Signed)
     Date of Visit: 01/09/2018   HPI:  Bilateral Leg Weakness: - patient was in clinic on 4/24 and received bilateral knee injections  - she reports that starting Wednesday, she felt off balance. She went to see her friend and walked upstairs without difficulty. When she was walking back down stairs she noticed that she was not walking like she usually does. Denies any numbness of the lower extremities  - no saddle anesthesia, urinary incontinence/retention, or bowel incontinence - no upper extremity weakness/numbness, no slurred speech or facial droop - has no prior history of this.  - reports that she fell trying to get her pants on  ROS: See HPI.  [place]:  PMH: DM2  PHYSICAL EXAM: BP (!) 169/105   Ht '5\' 5"'$  (1.651 m)   Wt 242 lb (109.8 kg)   LMP 09/10/2011   BMI 40.27 kg/m  GEN: NAD HEENT: EOMI, sclera clear,  PULM: normal effort MSK:  Hip Flexors: 4/5 bilaterally  Knee Extensors: 5/5 Dorsiflexion and Plantarflexion: 5/5  Patellar and Achilles reflexes unable to  Gait: abnormal gait that is difficult to describe. It appears that she has weakness of hip flexors with ambulation. Seems a little off balance.   Romberg test is normal NEURO: Awake, alert, no focal deficits grossly, normal speech. CN 3-12 intact. Upper extremity strength normal. Unable to produce biceps reflexes    ASSESSMENT/PLAN:  Bilateral Lower Extremity Weakness:  About 48 hour history of bilateral hip flexor weakness with abnormal gait. Dicussed with neurology on call. This is possibly some sort of spinal compression. Patient agreeable to admission. Recommend initial work up with MRI C spine without contrast. Neurology also recommended TSH, ESR, and CK. Discussed with nursing at [facility]. Patient has a bed. Discussed with FM teaching service inpatient team who will admit patient.   Smiley Houseman, MD PGY [place]

## 2018-01-09 NOTE — Consult Note (Addendum)
NEURO HOSPITALIST CONSULT NOTE   Requestig physician: Dr. Milta Deiters   Reason for Consult: Bilateral leg weakness   History obtained from:  Patient     HPI:                                                                                                                                          Lindsay Travis is an 57 y.o. female with past medical history of allergies, clotting disorder, diabetes, hypertension.  Her average A1c is between 8 and 7.  That would make her average glucose approximately 186.  Patient states that over the last couple weeks she has noticed significant knee pain.  Especially when walking long distances or getting up and out of the car.  She tried to use a knee silicone wrap with no benefit apparently her PCP gave her third injection to the knees which did help for a while.  Over the last 3 days patient feels as though her legs overall are weak however she does state when she goes to sit down she does have knee pain.  She denies any facial weakness upper extremity weakness, diplopia.  She does admit to having peripheral neuropathy which is evident on exam.  Denies any recent falls.  He denies any muscle pain or discomfort.  CK is 80 Sed[place] rate 21 A1c 8.0 TSH 2.317 B12 1790     Past Medical History:  Diagnosis Date  . Allergy   . Anemia   . Clotting disorder ([place])   . Diabetes mellitus    recent high blood sugar and has RX but not taking  med -b/c she doesn't think she  is diabetic, just had lots of sugar in her diet when  dx'd.  [place] Kitchen Hypertension   . Shortness of breath    on exertion- has low hgb    Past Surgical History:  Procedure Laterality Date  . ABDOMINAL HYSTERECTOMY  09/11/2011   Procedure: HYSTERECTOMY ABDOMINAL;  Surgeon: Frederico Hamman, MD;  Location: [facility];  Service: Gynecology;  Laterality: N/A;  . NO PAST SURGERIES      Family History  Problem Relation Age of Onset  . Hypertension Other       Social History:   reports that she has never smoked. She has never used smokeless tobacco. She reports that she does not drink alcohol or use drugs.  Allergies  Allergen Reactions  . Lisinopril Other (See Comments)    Did not feel good  . Losartan Other (See Comments)    headache  . Aspirin Other (See Comments)    Abdominal pain    MEDICATIONS:  Prior to Admission:  No medications prior to admission.   Scheduled: . enoxaparin (LOVENOX) injection  40 mg Subcutaneous Q24H  . hydrochlorothiazide  25 mg Oral Daily  . insulin aspart  0-9 Units Subcutaneous TID WC   Continuous:    ROS:                                                                                                                                       History obtained from the patient  General ROS: negative for - chills, fatigue, fever, night sweats, weight gain or weight loss Psychological ROS: negative for - behavioral disorder, hallucinations, memory difficulties, mood swings or suicidal ideation Ophthalmic ROS: negative for - blurry vision, double vision, eye pain or loss of vision ENT ROS: negative for - epistaxis, nasal discharge, oral lesions, sore throat, tinnitus or vertigo Allergy and Immunology ROS: negative for - hives or itchy/watery eyes Hematological and Lymphatic ROS: negative for - bleeding problems, bruising or swollen lymph nodes Endocrine ROS: negative for - galactorrhea, hair pattern changes, polydipsia/polyuria or temperature intolerance Respiratory ROS: negative for - cough, hemoptysis, shortness of breath or wheezing Cardiovascular ROS: negative for - chest pain, dyspnea on exertion, edema or irregular heartbeat Gastrointestinal ROS: negative for - abdominal pain, diarrhea, hematemesis, nausea/vomiting or stool incontinence Genito-Urinary ROS: negative for - dysuria, hematuria, incontinence or  urinary frequency/urgency Musculoskeletal ROS: Positive for -  muscular weakness, knee pain Neurological ROS: as noted in HPI Dermatological ROS: negative for rash and skin lesion changes   Blood pressure (!) 173/114, pulse 67, temperature 98.3 F (36.8 C), temperature source Oral, last menstrual period 09/10/2011, SpO2 100 %.   General Examination:                                                                                                       Physical Exam  HEENT-  Normocephalic, no lesions, without obvious abnormality.  Normal external eye and conjunctiva.   Cardiovascular- S1-S2 audible, pulses palpable throughout   Lungs-no rhonchi or wheezing noted, no excessive working breathing.  Saturations within normal limits Abdomen- All 4 quadrants palpated and nontender Extremities- Warm, dry and intact Musculoskeletal-knee pain, deformity or swelling Skin-warm and dry, no hyperpigmentation, vitiligo, or suspicious lesions  Neurological Examination Mental Status: Alert, oriented, thought content appropriate.  Speech fluent without evidence of aphasia.  Able to follow 3 step commands without difficulty. Cranial Nerves: II: Visual fields grossly normal,  III,IV, VI: ptosis not present, extra-ocular motions intact bilaterally pupils  equal, round, reactive to light and accommodation V,VII: smile symmetric, facial light touch sensation normal bilaterally VIII: hearing normal bilaterally IX,X: uvula rises symmetrically XI: bilateral shoulder shrug XII: midline tongue extension Motor: Right : Upper extremity   5/5    Left:     Upper extremity   5/5  Lower extremity   5/5     Lower extremity   5/5 --Of note patient has 5 out of 5 strength throughout, she is able to guide bed without difficulty.  She is able to do a quarter squat without any difficulty oriented to decreased strength.  Sensory: Patient has intact proprioception, intact vibratory sensation, decreased temperature and  sensation up to midcalf and also decreased pinprick sensation up to Deep Tendon Reflexes: 2+ and symmetric throughout upper extremities with 1+ reflexes at the knees , 1+ ankles Plantars: Right: downgoing   Left: downgoing Cerebellar: normal finger-to-nose,and normal heel-to-shin test--some difficulty with body habitus Gait: She does have a slightly wide-based gait, is noted that she has flat arches, she is able to walk with tandem gait, it is apparent that she feels a little off balance when turning.  She does lift her feet off the floor without any significant difficulty   Lab Results: Basic Metabolic Panel: Recent Labs  Lab 01/09/18 1226  NA 140  K 3.7  CL 104  CO2 27  GLUCOSE 159*  BUN 13  CREATININE 0.66  CALCIUM 9.4    CBC: Recent Labs  Lab 01/09/18 1226  WBC 4.0  HGB 13.2  HCT 39.6  MCV 84.1  PLT 226    Cardiac Enzymes: Recent Labs  Lab 01/09/18 1226  CKTOTAL 80    Lipid Panel: No results for input(s): CHOL, TRIG, HDL, CHOLHDL, VLDL, LDLCALC in the last 168 hours.  Imaging: Bilateral knees show tricompartment degeneration.  She also shows significant patellofemoral disease with sunrise view on both knees.  It should be noted also that during exam she has significant crepitus with lateral motion of her patella over her femur.  Along with the fact that her patella is significantly laterally placed.   Assessment and plan per attending neurologist  Etta Quill PA-C [facility] [phone]  01/09/2018, 4:42 PM  I have seen the patient reviewed the above note, I agree with the exception that on my exam with Jendrasic maneuver I was able to elicit reflexes at both the ankles and patella.  Assessment/Plan: There is a 57 year old female who has had 2 weeks history of knee pain now 3 days history of subjective bilateral leg weakness.   She also has extremely flat feet, along with x-ray findings of patellofemoral syndrome with lateral displacement of  patella and significant amount of osteo spurning.  With further questioning, I do not think that she has true weakness but rather gait unsteadiness.  I do think she will need brain imaging to evaluate the unsteadiness.   Recommend: --PT/OT--need significant leg strengthing --VMO exercises -- brain MRI and thoracic MRI     Roland Rack, MD [facility] [phone]  If 7pm- 7am, please page neurology on call as listed in [place].

## 2018-01-09 NOTE — Progress Notes (Signed)
Patient arrived to 6n26 A&Ox4, VSS, no IV present.  Skin is intact.  No family at bedside.  Patient oriented to room and equipment.  Will continue to monitor.

## 2018-01-09 NOTE — Evaluation (Signed)
Physical Therapy Evaluation Patient Details Name: Lindsay Travis MRN: [id number] DOB: 1960-09-26 Today's Date: 01/09/2018   History of Present Illness  Pt is a 57 y/o female admitted secondary to bilateral LE weakness of unknown etiology. Work-up in progress. PMH including but not limited to DM and HTN.    Clinical Impression  Pt presented supine in bed with HOB elevated, awake and willing to participate in therapy session. Prior to admission, pt reported that she was independent with all functional mobility and ADLs. Pt lives with family and has 24/7 support at home. Pt currently able to perform bed mobility with modified independence, transfers with supervision and ambulated in hallway without use of an AD with min guard for safety. Pt with minor instability and LOB that she was able to self-correct. MMT revealed good functional strength (4/5 bilaterally for hip flexion, hip abduction, hip adduction, knee flexion, knee extension, and ankle DF). Pt would benefit from further therapy services in an outpatient setting. PT will continue to follow pt acutely for further higher level balance assessment.    Follow Up Recommendations Outpatient PT    Equipment Recommendations  None recommended by PT    Recommendations for Other Services       Precautions / Restrictions Precautions Precautions: None      Mobility  Bed Mobility Overal bed mobility: Modified Independent                Transfers Overall transfer level: Needs assistance Equipment used: None Transfers: Sit to/from Stand Sit to Stand: Supervision            Ambulation/Gait Ambulation/Gait assistance: Min guard Ambulation Distance (Feet): 200 Feet Assistive device: None Gait Pattern/deviations: Step-through pattern;Decreased stride length;Drifts right/left Gait velocity: decreased Gait velocity interpretation: <1.31 ft/sec, indicative of household ambulator General Gait Details: mild instability with drifting  R/L, minor LOB but able to self-correct; min guard for safety  Stairs            Wheelchair Mobility    Modified Rankin (Stroke Patients Only)       Balance Overall balance assessment: Needs assistance Sitting-balance support: Feet supported Sitting balance-Leahy Scale: Normal Sitting balance - Comments: pt able to don socks in sitting with supervision   Standing balance support: During functional activity;No upper extremity supported Standing balance-Leahy Scale: Fair               High level balance activites: Turns;Direction changes;Head turns High Level Balance Comments: min guard for safety             Pertinent Vitals/Pain Pain Assessment: No/denies pain    Home Living Family/patient expects to be discharged to:: Private residence Living Arrangements: Spouse/significant other;Other relatives Available Help at Discharge: Family;Available 24 hours/day Type of Home: House Home Access: Level entry     Home Layout: Two level;Able to live on main level with bedroom/bathroom Home Equipment: None      Prior Function Level of Independence: Independent               Hand Dominance        Extremity/Trunk Assessment   Upper Extremity Assessment Upper Extremity Assessment: Overall WFL for tasks assessed    Lower Extremity Assessment Lower Extremity Assessment: Overall WFL for tasks assessed    Cervical / Trunk Assessment Cervical / Trunk Assessment: Normal  Communication   Communication: No difficulties  Cognition Arousal/Alertness: Awake/alert Behavior During Therapy: WFL for tasks assessed/performed Overall Cognitive Status: Within Functional Limits for tasks assessed  General Comments      Exercises     Assessment/Plan    PT Assessment Patient needs continued PT services  PT Problem List Decreased mobility;Decreased balance;Decreased coordination       PT Treatment  Interventions DME instruction;Gait training;Stair training;Functional mobility training;Therapeutic activities;Balance training;Neuromuscular re-education;Therapeutic exercise;Patient/family education    PT Goals (Current goals can be found in the Care Plan section)  Acute Rehab PT Goals Patient Stated Goal: return to feeling "normal" PT Goal Formulation: With patient Time For Goal Achievement: 01/23/18 Potential to Achieve Goals: Good    Frequency Min 3X/week   Barriers to discharge        Co-evaluation               AM-PAC PT "6 Clicks" Daily Activity  Outcome Measure Difficulty turning over in bed (including adjusting bedclothes, sheets and blankets)?: None Difficulty moving from lying on back to sitting on the side of the bed? : None Difficulty sitting down on and standing up from a chair with arms (e.g., wheelchair, bedside commode, etc,.)?: None Help needed moving to and from a bed to chair (including a wheelchair)?: None Help needed walking in hospital room?: A Little Help needed climbing 3-5 steps with a railing? : A Little 6 Click Score: 22    End of Session Equipment Utilized During Treatment: Gait belt Activity Tolerance: Patient tolerated treatment well Patient left: in bed;with call bell/phone within reach Nurse Communication: Mobility status PT Visit Diagnosis: Other abnormalities of gait and mobility (R26.89)    Time: 9604-5409 PT Time Calculation (min) (ACUTE ONLY): 18 min   Charges:   PT Evaluation $PT Eval Low Complexity: 1 Low     PT G Codes:        [place], PT, DPT [place] 01/09/2018, 6:00 PM

## 2018-01-09 NOTE — H&P (Signed)
[facility] Admission History and Physical Service Pager: [phone]  Patient name: Lindsay Travis Medical record number: [id number] Date of birth: 12/24/1960 Age: 57 y.o. Gender: female  Primary Care Provider: Nolene Ebbs, MD Consultants: Neurology Code Status: Full  Chief Complaint: leg weakness  Assessment and Plan: Lindsay Travis is a 57 y.o. female presenting with subjective bilateral lower extremity weakness for 3 days. PMH is significant for hypertension and diabetes for which she currently takes HCTZ and nothing, respectively.  #Gait abnormality: Patient noted acute change in gait during the day Wednesday after using stairs. Describes feet and lower legs as feeling heavy. Gait appears somewhat magnetic without full plantarflexion. No confusion or urinary incontinence to suggest NPH but does feel "off balance." Patient with intact proprioception and negative Rhomberg so decreased suspicion for vestibular issue. Will obtain cervical and thoracic MRI to rule out compressive myelopathy, especially with report of possible urinary retention. No midline spinal tenderness or recent injury to suggest compression fracture. History of being on metformin but not recently and denies numbness or tingling, so B12 deficiency less likely. No recent fever or diarrhea to suggest infectious etiology.  - Admit to observation, attending Dr. Nori Riis - Will obtain MRI c-spine and t-spine - Consulted Neurology - Ordered RPR, B12, Folate, TSH, CK, ESR - PT consult  #HTN: Poorly controlled. BP up to 173/114 on admission. Has not taken medication in 2 days due to not feeling well. Has tried ACEi and ARB but discontinued due to feeling "sick" when taking them. Denies headache, vision changes, chest pain.  - Will restart home HCTZ 25 mg  - Prn hydralazine for SBP>180, DBP>100  #T2DM: Last A1c 8.6 12/19/17. Patient reports she was told her sugars were well enough controlled she does  not need medication. She had been out of metformin for a few months prior to A1c being checked. - qACHS CBGs - sensitive SSI - hold metformin in case of imaging requiring contrast  #Knee Pain: Tricompartmental degenerative changes. Improved status post knee injections last week.   FEN/GI: carb-modified/heart healthy Prophylaxis: lovenox  Disposition: Home pending work-up  History of Present Illness:  Lindsay Travis is a 57 y.o. female presenting with bilateral lower extremity weakness.  She reports these symptoms started Wednesday (5/1). She describes feeling off balance: "when I walk... I don't stand so good."  Though she feels weak, she has not actually fallen. When she has gone to put on pants, however, she has almost fallen. She has not experienced this before.  She has knee pain for which the orthopedist gave her a knee brace, but only wore it for a morning as it caused worsening pain. She denies any skin changes or rashes. Was having low back pain before which she is currently not experiencing. Never been in a car accident or had previous back injury.  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  Says sometimes when her blood pressure is high, she knows by her back pain and needs to rest, but she denies this being the case this time. In the last two weeks, it has been taking her longer to urinate than usual: feels like she needs to urinate but has to wait for stream to initiate. Describes some radiating back numbness prior to her knee injections. She also reports recent lateral thigh pain with wearing high heels.  Denies family history of weakness/balance problems.  Denies smoking, alcohol, or use of illicit drugs. Takes  a magnesium and calcium supplement as well as a collagen supplement.   See progress note from sports medicine clinic physician earlier today for further detail:   "Bilateral Leg Weakness: - patient was  in clinic on 4/24 and received bilateral knee injections  - she reports that starting Wednesday, she felt off balance. She went to see her friend and walked upstairs without difficulty. When she was walking back down stairs she noticed that she was not walking like she usually does. Denies any numbness of the lower extremities  - no saddle anesthesia, urinary incontinence, or bowel incontinence - no upper extremity weakness/numbness, no slurred speech or facial droop - has no prior history of this.  - reports that she fell trying to get her pants on"   Review Of Systems: Per HPI with the following additions: notably negative for headaches, vision changes, appetite changes, abdominal pain, room spinning/dizziness, diarrhea  Review of Systems  Constitutional: Negative for chills, fever and weight loss.  HENT: Negative for hearing loss and sore throat.   Eyes: Negative for blurred vision and pain.  Respiratory: Negative for cough and shortness of breath.   Cardiovascular: Negative for chest pain and leg swelling.  Gastrointestinal: Negative for nausea and vomiting.  Genitourinary: Negative for dysuria, flank pain and frequency.       Sensation of needing to go to the bathroom but unable to void for past 2 weeks  Musculoskeletal: Positive for back pain (not presently but had lower back pain a few days ago) and myalgias (upper thighs, especially with wearing heels). Negative for falls (near falls with putting on pants).  Skin: Negative for rash.  Neurological: Positive for weakness. Negative for dizziness, loss of consciousness and headaches.    Patient Active Problem List   Diagnosis Date Noted  . Lower extremity weakness 01/09/2018  . Controlled diabetes mellitus type II without complication ([place]) 78/93/8101  . Bilateral knee pain 05/26/2012  . Varicose veins of lower extremities with other complications 75/06/2584  . Fibroid, uterus 04/18/2011  . Metrorrhagia 04/18/2011  . Hypertension  04/18/2011    Past Medical History: Past Medical History:  Diagnosis Date  . Allergy   . Anemia   . Clotting disorder ([employer])   . Diabetes mellitus    recent high blood sugar and has RX but not taking  med -b/c she doesn't think she  is diabetic, just had lots of sugar in her diet when  dx'd.  [place] Kitchen Hypertension   . Shortness of breath    on exertion- has low hgb    Past Surgical History: Past Surgical History:  Procedure Laterality Date  . ABDOMINAL HYSTERECTOMY  09/11/2011   Procedure: HYSTERECTOMY ABDOMINAL;  Surgeon: Frederico Hamman, MD;  Location: [facility];  Service: Gynecology;  Laterality: N/A;  . NO PAST SURGERIES      Social History: Social History   Tobacco Use  . Smoking status: Never Smoker  . Smokeless tobacco: Never Used  Substance Use Topics  . Alcohol use: No  . 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  Family History: Family History  Problem Relation Age of Onset  . Hypertension Other     Allergies and Medications: Allergies  Allergen Reactions  . Lisinopril Other (See Comments)    Did not feel good  .  Losartan Other (See Comments)    headache  . Aspirin Other (See Comments)    Abdominal pain   No current facility-administered medications on file prior to encounter.    Current Outpatient Medications on File Prior to Encounter  Medication Sig Dispense Refill  . ibuprofen (ADVIL,MOTRIN) 200 MG tablet Take 200 mg by mouth every 6 (six) hours as needed.      Objective: BP (!) 173/114   Pulse 67   Temp 98.3 F (36.8 C) (Oral)   LMP 09/10/2011   SpO2 100%  Exam: General: Well-appearing female in NAD, resting in bed Eyes: PERRLA, no scleral icterus ENTM: MMM, no nasal congestion Neck: supple, normal ROM Cardiovascular: RRR, S1, S2, no  m/r/g Respiratory: CTAB, no increased WOB Gastrointestinal: BS+, nontender to palpation MSK: Nontender to palpation along lumbar spine or lateral muscles. Moves all extremities spontaneously.  Derm: no skin lesions or rashes noted Neuro: CN 2-12 grossly intact. Upper extremity strength grossly intact.  Negative rhomberg test, negative heel-to-shin test. Able to stand on toes, difficulty standing on heels. Finger to nose test negative for tremor or lack of coordination. strength 5/5 in all lower extremity muscle groups. Sensation intact bilaterally in lower extremities. No proprioceptive deficiency on toe test. Absent patellar and achilles reflexes bilaterally.   Psych: appropriate affect  Labs and Imaging: CBC BMET  No results for input(s): WBC, HGB, HCT, PLT in the last 168 hours. No results for input(s): NA, K, CL, CO2, BUN, CREATININE, GLUCOSE, CALCIUM in the last 168 hours.   H&P prepared by  Olene Floss, MD [place], PGY-3  and  Roche, Sallyanne Havers, Medical Student 01/09/2018, 12:17 PM MS4, [place] [facility] Intern pager: [phone], text pages welcome

## 2018-01-09 NOTE — Progress Notes (Signed)
[facility] Attending Note: I have seen and examined this patient. I have discussed this patient with the resident and reviewed the assessment and plan as documented above. I agree with the resident's findings and plan. Odd and  worrisome presentation.   Gait is unsteady, she has shortened stride length and almost a high-stepping compnent as if she is throwing her foot forward--maybe in compensation for loss of swing phaseI spoke with Dr. Krista Blue at neurology for a curbside consult.  Ultimately the patient has decided to admit her to the hospital for emergent work-up, especially as it is Friday and we can get her in to see neurology or get any imaging done over the weekend.  She is amenable to this plan.  We have arranged for admission to our family medicine inpatient service.

## 2018-01-10 ENCOUNTER — Observation Stay (HOSPITAL_COMMUNITY): Payer: BLUE CROSS/BLUE SHIELD

## 2018-01-10 DIAGNOSIS — R269 Unspecified abnormalities of gait and mobility: Secondary | ICD-10-CM

## 2018-01-10 DIAGNOSIS — I6330 Cerebral infarction due to thrombosis of unspecified cerebral artery: Secondary | ICD-10-CM | POA: Diagnosis not present

## 2018-01-10 DIAGNOSIS — Z79899 Other long term (current) drug therapy: Secondary | ICD-10-CM | POA: Diagnosis not present

## 2018-01-10 DIAGNOSIS — E119 Type 2 diabetes mellitus without complications: Secondary | ICD-10-CM | POA: Diagnosis not present

## 2018-01-10 DIAGNOSIS — E785 Hyperlipidemia, unspecified: Secondary | ICD-10-CM | POA: Diagnosis not present

## 2018-01-10 DIAGNOSIS — Z888 Allergy status to other drugs, medicaments and biological substances status: Secondary | ICD-10-CM | POA: Diagnosis not present

## 2018-01-10 DIAGNOSIS — E118 Type 2 diabetes mellitus with unspecified complications: Secondary | ICD-10-CM

## 2018-01-10 DIAGNOSIS — I6602 Occlusion and stenosis of left middle cerebral artery: Secondary | ICD-10-CM | POA: Diagnosis not present

## 2018-01-10 DIAGNOSIS — M25562 Pain in left knee: Secondary | ICD-10-CM | POA: Diagnosis not present

## 2018-01-10 DIAGNOSIS — Z886 Allergy status to analgesic agent status: Secondary | ICD-10-CM | POA: Diagnosis not present

## 2018-01-10 DIAGNOSIS — Z7982 Long term (current) use of aspirin: Secondary | ICD-10-CM | POA: Diagnosis not present

## 2018-01-10 DIAGNOSIS — R262 Difficulty in walking, not elsewhere classified: Secondary | ICD-10-CM | POA: Diagnosis not present

## 2018-01-10 DIAGNOSIS — M25561 Pain in right knee: Secondary | ICD-10-CM | POA: Diagnosis not present

## 2018-01-10 DIAGNOSIS — Z7902 Long term (current) use of antithrombotics/antiplatelets: Secondary | ICD-10-CM | POA: Diagnosis not present

## 2018-01-10 DIAGNOSIS — I10 Essential (primary) hypertension: Secondary | ICD-10-CM

## 2018-01-10 DIAGNOSIS — I639 Cerebral infarction, unspecified: Secondary | ICD-10-CM | POA: Diagnosis not present

## 2018-01-10 DIAGNOSIS — R2681 Unsteadiness on feet: Secondary | ICD-10-CM | POA: Diagnosis not present

## 2018-01-10 LAB — BASIC METABOLIC PANEL
Anion gap: 10 (ref 5–15)
BUN: 14 mg/dL (ref 6–20)
CO2: 30 mmol/L (ref 22–32)
Calcium: 9.5 mg/dL (ref 8.9–10.3)
Chloride: 99 mmol/L — ABNORMAL LOW (ref 101–111)
Creatinine, Ser: 0.89 mg/dL (ref 0.44–1.00)
GFR calc Af Amer: >60 mL/min (ref >60)
GLUCOSE: 169 mg/dL — AB (ref 65–99)
POTASSIUM: 3.7 mmol/L (ref 3.5–5.1)
Sodium: 139 mmol/L (ref 135–145)

## 2018-01-10 LAB — LIPID PANEL
CHOL/HDL RATIO: 3.3 ratio
Cholesterol: 205 mg/dL — ABNORMAL HIGH (ref 0–200)
HDL: 63 mg/dL (ref >40)
LDL CALC: 130 mg/dL — AB (ref 0–99)
Triglycerides: 60 mg/dL (ref <150)
VLDL: 12 mg/dL (ref 0–40)

## 2018-01-10 LAB — CBC
HCT: 39.3 % (ref 36.0–46.0)
Hemoglobin: 12.9 g/dL (ref 12.0–15.0)
MCH: 27.5 pg (ref 26.0–34.0)
MCHC: 32.8 g/dL (ref 30.0–36.0)
MCV: 83.8 fL (ref 78.0–100.0)
PLATELETS: 209 10*3/uL (ref 150–400)
RBC: 4.69 MIL/uL (ref 3.87–5.11)
RDW: 14.4 % (ref 11.5–15.5)
WBC: 4.8 10*3/uL (ref 4.0–10.5)

## 2018-01-10 LAB — GLUCOSE, CAPILLARY
GLUCOSE-CAPILLARY: 171 mg/dL — AB (ref 65–99)
GLUCOSE-CAPILLARY: 148 mg/dL — AB (ref 65–99)
Glucose-Capillary: 188 mg/dL — ABNORMAL HIGH (ref 65–99)
Glucose-Capillary: 249 mg/dL — ABNORMAL HIGH (ref 65–99)

## 2018-01-10 LAB — HIV ANTIBODY (ROUTINE TESTING W REFLEX): HIV Screen 4th Generation wRfx: NONREACTIVE

## 2018-01-10 LAB — RPR: RPR Ser Ql: NONREACTIVE

## 2018-01-10 MED ORDER — IOPAMIDOL (ISOVUE-370) INJECTION 76%
50.0000 mL | Freq: Once | INTRAVENOUS | Status: AC | PRN
  Administered 2018-01-10: 50 mL via INTRAVENOUS

## 2018-01-10 MED ORDER — IOPAMIDOL (ISOVUE-370) INJECTION 76%
INTRAVENOUS | Status: AC
  Filled 2018-01-10: qty 50

## 2018-01-10 MED ORDER — CLOPIDOGREL BISULFATE 75 MG PO TABS
75.0000 mg | ORAL_TABLET | Freq: Every day | ORAL | Status: DC
  Administered 2018-01-10 – 2018-01-11 (×2): 75 mg via ORAL
  Filled 2018-01-10 (×2): qty 1

## 2018-01-10 MED ORDER — AMLODIPINE BESYLATE 10 MG PO TABS
10.0000 mg | ORAL_TABLET | Freq: Every day | ORAL | Status: DC
  Administered 2018-01-10 – 2018-01-11 (×2): 10 mg via ORAL
  Filled 2018-01-10 (×2): qty 1

## 2018-01-10 MED ORDER — ASPIRIN EC 81 MG PO TBEC
81.0000 mg | DELAYED_RELEASE_TABLET | Freq: Every day | ORAL | Status: DC
  Administered 2018-01-10 – 2018-01-11 (×2): 81 mg via ORAL
  Filled 2018-01-10 (×2): qty 1

## 2018-01-10 NOTE — Progress Notes (Signed)
STROKE TEAM PROGRESS NOTE   SUBJECTIVE (INTERVAL HISTORY) No family is at the bedside.  Overall she feels her condition is gradually improving. She said Wednesday she went to see her doctor for b/l knee injection for knee pain. Then she felt weakness on both legs and wobbly walking. Today, she walks in the room feels much better.   OBJECTIVE Temp:  [98.3 F (36.8 C)-98.8 F (37.1 C)] 98.7 F (37.1 C) (05/04 0610) Pulse Rate:  [64-71] 67 (05/04 0610) Resp:  [16-18] 16 (05/04 0610) BP: (160-177)/(72-114) 162/72 (05/04 0610) SpO2:  [100 %] 100 % (05/04 0610) Weight:  [242 lb (109.8 kg)] 242 lb (109.8 kg) (05/03 1017)  Recent Labs  Lab 01/09/18 1227 01/09/18 1717 01/09/18 2150 01/10/18 0805  GLUCAP 156* 138* 182* 171*   Recent Labs  Lab 01/09/18 1226 01/10/18 0447  NA 140 139  K 3.7 3.7  CL 104 99*  CO2 27 30  GLUCOSE 159* 169*  BUN 13 14  CREATININE 0.66 0.89  CALCIUM 9.4 9.5   Recent Labs  Lab 01/09/18 1226  AST 17  ALT 23  ALKPHOS 61  BILITOT 0.6  PROT 7.5  ALBUMIN 3.6   Recent Labs  Lab 01/09/18 1226 01/10/18 0447  WBC 4.0 4.8  HGB 13.2 12.9  HCT 39.6 39.3  MCV 84.1 83.8  PLT 226 209   Recent Labs  Lab 01/09/18 1226  CKTOTAL 80   No results for input(s): LABPROT, INR in the last 72 hours. No results for input(s): COLORURINE, LABSPEC, [place], GLUCOSEU, HGBUR, BILIRUBINUR, KETONESUR, PROTEINUR, UROBILINOGEN, NITRITE, LEUKOCYTESUR in the last 72 hours.  Invalid input(s): APPERANCEUR     Component Value Date/Time   CHOL 205 (H) 01/10/2018 0447   TRIG 60 01/10/2018 0447   HDL 63 01/10/2018 0447   CHOLHDL 3.3 01/10/2018 0447   VLDL 12 01/10/2018 0447   LDLCALC 130 (H) 01/10/2018 0447   Lab Results  Component Value Date   HGBA1C 8.0 (H) 01/09/2018   No results found for: LABOPIA, COCAINSCRNUR, LABBENZ, AMPHETMU, THCU, LABBARB  No results for input(s): ETH in the last 168 hours.  I have personally reviewed the radiological images below and  agree with the radiology interpretations.  Mr Brain Wo Contrast  Result Date: 01/09/2018 CLINICAL DATA:  Initial evaluation for subjective bilateral lower extremity weakness for 3 days. EXAM: MRI HEAD WITHOUT CONTRAST MRI CERVICAL SPINE WITHOUT CONTRAST MRI THORACIC SPINE WITHOUT CONTRAST TECHNIQUE: Multiplanar, multiecho pulse sequences of the brain and surrounding structures, and cervical spine, to include the craniocervical junction and cervicothoracic junction, were obtained without intravenous contrast. COMPARISON:  None available. FINDINGS: MRI HEAD FINDINGS Brain: Cerebral volume within normal limits for age. Mild scattered nonspecific cerebral white matter changes present within the periventricular, deep, and subcortical white matter of both cerebral hemispheres. There is a 710 mm focus of restricted diffusion involving the right basal ganglia, with involvement of the anterior right lentiform nucleus and caudate (series 5001, image 64). Additional 12 mm focus of restricted diffusion extends from the posterior right lentiform nucleus towards the posterior right corona radiata (series 5001, image 65). Findings most likely reflect sequelae of acute vascular ischemia, although active demyelination could conceivably be considered as well. No other evidence for acute or subacute ischemia. Gray-white matter differentiation maintained. No other areas of chronic infarction. No acute or chronic intracranial hemorrhage. No mass lesion, midline shift or mass effect. No hydrocephalus. No extra-axial fluid collection. Major dural sinuses grossly patent. Pituitary gland suprasellar region normal. Midline structures intact  and normal. Vascular: Major intracranial vascular flow voids are maintained. Skull and upper cervical spine: Craniocervical junction normal. Bone marrow signal intensity within normal limits. No scalp soft tissue abnormality. Sinuses/Orbits: Globes and orbital soft tissues within normal limits.  Paranasal sinuses are clear. No mastoid effusion. Inner ear structures normal. Other: None. MRI CERVICAL SPINE FINDINGS Alignment: Straightening of the normal cervical lordosis. No listhesis. Vertebrae: Vertebral body heights maintained without evidence for acute or chronic fracture. Bone marrow signal intensity within normal limits. No discrete or worrisome osseous lesions. No abnormal marrow edema. Cord: Signal intensity within the cervical spinal cord is normal. No cord signal abnormality to suggest demyelinating disease. Cord caliber within normal limits without cord expansion or edema. Posterior Fossa, vertebral arteries, paraspinal tissues: Paraspinous and prevertebral soft tissues within normal limits. Normal intravascular flow voids present within the vertebral arteries bilaterally. Disc levels: C2-C3: Minimal disc bulge with uncovertebral hypertrophy. No significant canal stenosis. Mild left with moderate right C3 foraminal narrowing. C3-C4: Diffuse disc bulge with bilateral uncovertebral hypertrophy. No canal stenosis. Moderate bilateral C4 foraminal narrowing, greater on the right. C4-C5: Diffuse disc bulge with bilateral uncovertebral hypertrophy. No significant canal stenosis. Mild-to-moderate bilateral C5 foraminal stenosis. C5-C6: Mild diffuse disc bulge. Right greater than left facet hypertrophy. No canal stenosis. Mild right C6 foraminal narrowing. No significant left foraminal encroachment. C6-C7: Broad central disc protrusion indents the ventral thecal sac, abutting the ventral spinal cord. Mild spinal stenosis. Foramina remain patent. C7-T1: Minimal disc bulge. Right-sided facet hypertrophy. Resultant mild to moderate right C8 foraminal stenosis. No significant left foraminal encroachment. No canal stenosis. MRI THORACIC SPINE FINDINGS Alignment: Normal alignment with preservation of the normal thoracic kyphosis. No listhesis. Vertebrae: Vertebral body heights maintained without evidence for  acute or chronic fracture. Bone marrow signal intensity within normal limits. Mild reactive endplate changes present about the T7-8, T10-11, and T11-12 interspaces. No discrete or worrisome osseous lesions. No abnormal marrow edema. Cord: Signal intensity within the thoracic spinal cord is normal. Normal cord caliber. No findings to suggest demyelinating disease. No abnormal cord expansion or edema. Paraspinous soft tissues: Paraspinous soft tissues within normal limits. Visualized lungs are clear. Visualized visceral structures unremarkable. Disc levels: T1-2:  . unremarkable. T2-3: Unremarkable. T3-4:  Unremarkable. T4-5:  Unremarkable. T5-6: Diffuse disc bulge, asymmetric to the right. Effacement of the ventral CSF with mild stenosis. Foramina remain patent. T6-7: Diffuse disc bulge with superimposed broad left paracentral disc protrusion. Mild spinal stenosis with mild flattening of the left hemi cord. Foramina remain patent. T7-8: Diffuse disc bulge. Mild bilateral facet hypertrophy. Resultant mild spinal stenosis. Mild right foraminal narrowing. T8-9: Diffuse disc bulge. Mild facet hypertrophy. Mild spinal stenosis. T9-10: Mild diffuse disc bulge. Mild facet hypertrophy. Mild spinal stenosis. Mild left with moderate right foraminal narrowing. T10-11: Mild disc bulge. Right-sided facet hypertrophy. No significant canal stenosis. Mild right foraminal narrowing. T11-12: Mild disc bulge. Bilateral facet hypertrophy, worse on the right. No canal stenosis. Mild bilateral foraminal narrowing, worse on the right. T12-L1:  Unremarkable. IMPRESSION: MRI HEAD IMPRESSION 1. Two separate areas of restricted diffusion involving the right basal ganglia/corona radiata as above, favored to reflect acute ischemic vascular infarcts. Demyelinating disease could conceivably have this appearance as well, and could be considered in the correct clinical setting. 2. Additional mild nonspecific cerebral white matter changes. MRI  CERVICAL SPINE IMPRESSION 1. No acute abnormality within the cervical spine. Normal MRI appearance of the cervical spinal cord without evidence for demyelinating disease. 2. Broad central disc protrusion at C6-7  with resultant mild spinal stenosis. 3. Multifactorial degenerative changes with resultant mild to moderate multilevel foraminal narrowing as above. MRI THORACIC SPINE IMPRESSION 1. No acute abnormality within the thoracic spine. Normal MRI appearance of the thoracic spinal cord without evidence for demyelinating disease. 2. Multilevel disc bulging/disc protrusions at T5-6 through T11-12 with resultant mild multilevel spinal and foraminal stenosis as above. Electronically Signed   By: Jeannine Boga M.D.   On: 01/09/2018 22:27   Mr Cervical Spine Wo Contrast  Result Date: 01/09/2018 CLINICAL DATA:  Initial evaluation for subjective bilateral lower extremity weakness for 3 days. EXAM: MRI HEAD WITHOUT CONTRAST MRI CERVICAL SPINE WITHOUT CONTRAST MRI THORACIC SPINE WITHOUT CONTRAST TECHNIQUE: Multiplanar, multiecho pulse sequences of the brain and surrounding structures, and cervical spine, to include the craniocervical junction and cervicothoracic junction, were obtained without intravenous contrast. COMPARISON:  None available. FINDINGS: MRI HEAD FINDINGS Brain: Cerebral volume within normal limits for age. Mild scattered nonspecific cerebral white matter changes present within the periventricular, deep, and subcortical white matter of both cerebral hemispheres. There is a 710 mm focus of restricted diffusion involving the right basal ganglia, with involvement of the anterior right lentiform nucleus and caudate (series 5001, image 64). Additional 12 mm focus of restricted diffusion extends from the posterior right lentiform nucleus towards the posterior right corona radiata (series 5001, image 65). Findings most likely reflect sequelae of acute vascular ischemia, although active demyelination could  conceivably be considered as well. No other evidence for acute or subacute ischemia. Gray-white matter differentiation maintained. No other areas of chronic infarction. No acute or chronic intracranial hemorrhage. No mass lesion, midline shift or mass effect. No hydrocephalus. No extra-axial fluid collection. Major dural sinuses grossly patent. Pituitary gland suprasellar region normal. Midline structures intact and normal. Vascular: Major intracranial vascular flow voids are maintained. Skull and upper cervical spine: Craniocervical junction normal. Bone marrow signal intensity within normal limits. No scalp soft tissue abnormality. Sinuses/Orbits: Globes and orbital soft tissues within normal limits. Paranasal sinuses are clear. No mastoid effusion. Inner ear structures normal. Other: None. MRI CERVICAL SPINE FINDINGS Alignment: Straightening of the normal cervical lordosis. No listhesis. Vertebrae: Vertebral body heights maintained without evidence for acute or chronic fracture. Bone marrow signal intensity within normal limits. No discrete or worrisome osseous lesions. No abnormal marrow edema. Cord: Signal intensity within the cervical spinal cord is normal. No cord signal abnormality to suggest demyelinating disease. Cord caliber within normal limits without cord expansion or edema. Posterior Fossa, vertebral arteries, paraspinal tissues: Paraspinous and prevertebral soft tissues within normal limits. Normal intravascular flow voids present within the vertebral arteries bilaterally. Disc levels: C2-C3: Minimal disc bulge with uncovertebral hypertrophy. No significant canal stenosis. Mild left with moderate right C3 foraminal narrowing. C3-C4: Diffuse disc bulge with bilateral uncovertebral hypertrophy. No canal stenosis. Moderate bilateral C4 foraminal narrowing, greater on the right. C4-C5: Diffuse disc bulge with bilateral uncovertebral hypertrophy. No significant canal stenosis. Mild-to-moderate bilateral  C5 foraminal stenosis. C5-C6: Mild diffuse disc bulge. Right greater than left facet hypertrophy. No canal stenosis. Mild right C6 foraminal narrowing. No significant left foraminal encroachment. C6-C7: Broad central disc protrusion indents the ventral thecal sac, abutting the ventral spinal cord. Mild spinal stenosis. Foramina remain patent. C7-T1: Minimal disc bulge. Right-sided facet hypertrophy. Resultant mild to moderate right C8 foraminal stenosis. No significant left foraminal encroachment. No canal stenosis. MRI THORACIC SPINE FINDINGS Alignment: Normal alignment with preservation of the normal thoracic kyphosis. No listhesis. Vertebrae: Vertebral body heights maintained without evidence for acute  or chronic fracture. Bone marrow signal intensity within normal limits. Mild reactive endplate changes present about the T7-8, T10-11, and T11-12 interspaces. No discrete or worrisome osseous lesions. No abnormal marrow edema. Cord: Signal intensity within the thoracic spinal cord is normal. Normal cord caliber. No findings to suggest demyelinating disease. No abnormal cord expansion or edema. Paraspinous soft tissues: Paraspinous soft tissues within normal limits. Visualized lungs are clear. Visualized visceral structures unremarkable. Disc levels: T1-2:  . unremarkable. T2-3: Unremarkable. T3-4:  Unremarkable. T4-5:  Unremarkable. T5-6: Diffuse disc bulge, asymmetric to the right. Effacement of the ventral CSF with mild stenosis. Foramina remain patent. T6-7: Diffuse disc bulge with superimposed broad left paracentral disc protrusion. Mild spinal stenosis with mild flattening of the left hemi cord. Foramina remain patent. T7-8: Diffuse disc bulge. Mild bilateral facet hypertrophy. Resultant mild spinal stenosis. Mild right foraminal narrowing. T8-9: Diffuse disc bulge. Mild facet hypertrophy. Mild spinal stenosis. T9-10: Mild diffuse disc bulge. Mild facet hypertrophy. Mild spinal stenosis. Mild left with  moderate right foraminal narrowing. T10-11: Mild disc bulge. Right-sided facet hypertrophy. No significant canal stenosis. Mild right foraminal narrowing. T11-12: Mild disc bulge. Bilateral facet hypertrophy, worse on the right. No canal stenosis. Mild bilateral foraminal narrowing, worse on the right. T12-L1:  Unremarkable. IMPRESSION: MRI HEAD IMPRESSION 1. Two separate areas of restricted diffusion involving the right basal ganglia/corona radiata as above, favored to reflect acute ischemic vascular infarcts. Demyelinating disease could conceivably have this appearance as well, and could be considered in the correct clinical setting. 2. Additional mild nonspecific cerebral white matter changes. MRI CERVICAL SPINE IMPRESSION 1. No acute abnormality within the cervical spine. Normal MRI appearance of the cervical spinal cord without evidence for demyelinating disease. 2. Broad central disc protrusion at C6-7 with resultant mild spinal stenosis. 3. Multifactorial degenerative changes with resultant mild to moderate multilevel foraminal narrowing as above. MRI THORACIC SPINE IMPRESSION 1. No acute abnormality within the thoracic spine. Normal MRI appearance of the thoracic spinal cord without evidence for demyelinating disease. 2. Multilevel disc bulging/disc protrusions at T5-6 through T11-12 with resultant mild multilevel spinal and foraminal stenosis as above. Electronically Signed   By: Jeannine Boga M.D.   On: 01/09/2018 22:27   Mr Thoracic Spine Wo Contrast  Result Date: 01/09/2018 CLINICAL DATA:  Initial evaluation for subjective bilateral lower extremity weakness for 3 days. EXAM: MRI HEAD WITHOUT CONTRAST MRI CERVICAL SPINE WITHOUT CONTRAST MRI THORACIC SPINE WITHOUT CONTRAST TECHNIQUE: Multiplanar, multiecho pulse sequences of the brain and surrounding structures, and cervical spine, to include the craniocervical junction and cervicothoracic junction, were obtained without intravenous contrast.  COMPARISON:  None available. FINDINGS: MRI HEAD FINDINGS Brain: Cerebral volume within normal limits for age. Mild scattered nonspecific cerebral white matter changes present within the periventricular, deep, and subcortical white matter of both cerebral hemispheres. There is a 710 mm focus of restricted diffusion involving the right basal ganglia, with involvement of the anterior right lentiform nucleus and caudate (series 5001, image 64). Additional 12 mm focus of restricted diffusion extends from the posterior right lentiform nucleus towards the posterior right corona radiata (series 5001, image 65). Findings most likely reflect sequelae of acute vascular ischemia, although active demyelination could conceivably be considered as well. No other evidence for acute or subacute ischemia. Gray-white matter differentiation maintained. No other areas of chronic infarction. No acute or chronic intracranial hemorrhage. No mass lesion, midline shift or mass effect. No hydrocephalus. No extra-axial fluid collection. Major dural sinuses grossly patent. Pituitary gland suprasellar region normal.  Midline structures intact and normal. Vascular: Major intracranial vascular flow voids are maintained. Skull and upper cervical spine: Craniocervical junction normal. Bone marrow signal intensity within normal limits. No scalp soft tissue abnormality. Sinuses/Orbits: Globes and orbital soft tissues within normal limits. Paranasal sinuses are clear. No mastoid effusion. Inner ear structures normal. Other: None. MRI CERVICAL SPINE FINDINGS Alignment: Straightening of the normal cervical lordosis. No listhesis. Vertebrae: Vertebral body heights maintained without evidence for acute or chronic fracture. Bone marrow signal intensity within normal limits. No discrete or worrisome osseous lesions. No abnormal marrow edema. Cord: Signal intensity within the cervical spinal cord is normal. No cord signal abnormality to suggest demyelinating  disease. Cord caliber within normal limits without cord expansion or edema. Posterior Fossa, vertebral arteries, paraspinal tissues: Paraspinous and prevertebral soft tissues within normal limits. Normal intravascular flow voids present within the vertebral arteries bilaterally. Disc levels: C2-C3: Minimal disc bulge with uncovertebral hypertrophy. No significant canal stenosis. Mild left with moderate right C3 foraminal narrowing. C3-C4: Diffuse disc bulge with bilateral uncovertebral hypertrophy. No canal stenosis. Moderate bilateral C4 foraminal narrowing, greater on the right. C4-C5: Diffuse disc bulge with bilateral uncovertebral hypertrophy. No significant canal stenosis. Mild-to-moderate bilateral C5 foraminal stenosis. C5-C6: Mild diffuse disc bulge. Right greater than left facet hypertrophy. No canal stenosis. Mild right C6 foraminal narrowing. No significant left foraminal encroachment. C6-C7: Broad central disc protrusion indents the ventral thecal sac, abutting the ventral spinal cord. Mild spinal stenosis. Foramina remain patent. C7-T1: Minimal disc bulge. Right-sided facet hypertrophy. Resultant mild to moderate right C8 foraminal stenosis. No significant left foraminal encroachment. No canal stenosis. MRI THORACIC SPINE FINDINGS Alignment: Normal alignment with preservation of the normal thoracic kyphosis. No listhesis. Vertebrae: Vertebral body heights maintained without evidence for acute or chronic fracture. Bone marrow signal intensity within normal limits. Mild reactive endplate changes present about the T7-8, T10-11, and T11-12 interspaces. No discrete or worrisome osseous lesions. No abnormal marrow edema. Cord: Signal intensity within the thoracic spinal cord is normal. Normal cord caliber. No findings to suggest demyelinating disease. No abnormal cord expansion or edema. Paraspinous soft tissues: Paraspinous soft tissues within normal limits. Visualized lungs are clear. Visualized visceral  structures unremarkable. Disc levels: T1-2:  . unremarkable. T2-3: Unremarkable. T3-4:  Unremarkable. T4-5:  Unremarkable. T5-6: Diffuse disc bulge, asymmetric to the right. Effacement of the ventral CSF with mild stenosis. Foramina remain patent. T6-7: Diffuse disc bulge with superimposed broad left paracentral disc protrusion. Mild spinal stenosis with mild flattening of the left hemi cord. Foramina remain patent. T7-8: Diffuse disc bulge. Mild bilateral facet hypertrophy. Resultant mild spinal stenosis. Mild right foraminal narrowing. T8-9: Diffuse disc bulge. Mild facet hypertrophy. Mild spinal stenosis. T9-10: Mild diffuse disc bulge. Mild facet hypertrophy. Mild spinal stenosis. Mild left with moderate right foraminal narrowing. T10-11: Mild disc bulge. Right-sided facet hypertrophy. No significant canal stenosis. Mild right foraminal narrowing. T11-12: Mild disc bulge. Bilateral facet hypertrophy, worse on the right. No canal stenosis. Mild bilateral foraminal narrowing, worse on the right. T12-L1:  Unremarkable. IMPRESSION: MRI HEAD IMPRESSION 1. Two separate areas of restricted diffusion involving the right basal ganglia/corona radiata as above, favored to reflect acute ischemic vascular infarcts. Demyelinating disease could conceivably have this appearance as well, and could be considered in the correct clinical setting. 2. Additional mild nonspecific cerebral white matter changes. MRI CERVICAL SPINE IMPRESSION 1. No acute abnormality within the cervical spine. Normal MRI appearance of the cervical spinal cord without evidence for demyelinating disease. 2. Broad central disc protrusion  at C6-7 with resultant mild spinal stenosis. 3. Multifactorial degenerative changes with resultant mild to moderate multilevel foraminal narrowing as above. MRI THORACIC SPINE IMPRESSION 1. No acute abnormality within the thoracic spine. Normal MRI appearance of the thoracic spinal cord without evidence for demyelinating  disease. 2. Multilevel disc bulging/disc protrusions at T5-6 through T11-12 with resultant mild multilevel spinal and foraminal stenosis as above. Electronically Signed   By: Jeannine Boga M.D.   On: 01/09/2018 22:27   Dg Knee Ap/lat W/sunrise Left  Result Date: 12/31/2017 CLINICAL DATA:  Knee pain for 1 month, no known injury, initial encounter EXAM: LEFT KNEE 3 VIEWS COMPARISON:  05/26/2012 FINDINGS: Tricompartmental degenerative changes are noted. These have increased slightly in the interval from the prior exam. No joint effusion is seen. No acute fracture is noted. IMPRESSION: Slight progression of degenerative changes as described. Electronically Signed   By: Inez Catalina M.D.   On: 12/31/2017 15:57   Dg Knee Ap/lat W/sunrise Right  Result Date: 12/31/2017 CLINICAL DATA:  Bilateral knee pain for 1 month EXAM: RIGHT KNEE 3 VIEWS COMPARISON:  05/26/2012 FINDINGS: Tricompartmental degenerative changes are noted which have progressed in the interval from the prior exam. No acute fracture or dislocation is noted. Small joint effusion is noted. No soft tissue abnormality is seen. IMPRESSION: Tricompartmental degenerative change progressed in the interval from the prior exam. Small joint effusion is noted. Electronically Signed   By: Inez Catalina M.D.   On: 12/31/2017 15:56    CTA head and neck pending  TTE penidng   PHYSICAL EXAM  Temp:  [98.3 F (36.8 C)-98.8 F (37.1 C)] 98.7 F (37.1 C) (05/04 0610) Pulse Rate:  [64-71] 67 (05/04 0610) Resp:  [16-18] 16 (05/04 0610) BP: (160-177)/(72-114) 162/72 (05/04 0610) SpO2:  [100 %] 100 % (05/04 0610) Weight:  [242 lb (109.8 kg)] 242 lb (109.8 kg) (05/03 1017)  General - Well nourished, well developed, in no apparent distress.  Ophthalmologic - fundi not visualized due to noncooperation.  Cardiovascular - Regular rate and rhythm  Mental Status -  Level of arousal and orientation to time, place, and person were intact. Language  including expression, naming, repetition, comprehension was assessed and found intact. Fund of Knowledge was assessed and was intact.  Cranial Nerves II - XII - II - Visual field intact OU. III, IV, VI - Extraocular movements intact. V - Facial sensation intact bilaterally. VII - Facial movement intact bilaterally. VIII - Hearing & vestibular intact bilaterally. X - Palate elevates symmetrically. XI - Chin turning & shoulder shrug intact bilaterally. XII - Tongue protrusion intact.  Motor Strength - The patient's strength was normal in all extremities and pronator drift was absent.  Bulk was normal and fasciculations were absent.   Motor Tone - Muscle tone was assessed at the neck and appendages and was normal.  Reflexes - The patient's reflexes were symmetrical in all extremities and she had no pathological reflexes.  Sensory - Light touch, temperature/pinprick were assessed and were symmetrical.    Coordination - The patient had normal movements in the hands and feet with no ataxia or dysmetria.  Tremor was absent.  Gait and Station - walked in room, slowly but no imbalance.   ASSESSMENT/PLAN [name] is a 57 y.o. female with history of DM and HTN admitted for imbalanced gait. No tPA given due to OSW.    Stroke:  right BG/CR infarct likely secondary to small vessel disease source given risk factors and location  Resultant  imbalance gait resolved  MRI  Right BG/CR infarcts  MRI C/T spine unremarkable, no MS plaques  CTA head and neck - pending  2D Echo  pending  LDL 130  HgbA1c 8.0  lovenos for VTE prophylaxis  No antithrombotic prior to admission, now on aspirin 81 mg daily and clopidogrel 75 mg daily. Continue DAPT for 3 weeks and then ASA alone.  Patient counseled to be compliant with her antithrombotic medications  Ongoing aggressive stroke risk factor management  Therapy recommendations:  pending  Disposition:  pending  Diabetes  HgbA1c 8.0  goal < 7.0  Uncontrolled  CBG monitoring  SSI  DM education and close PCP follow up  Hypertension Unstable, on the high end Put on HCTZ and amlodipine  Long term BP goal normotensive  Hyperlipidemia  Home meds:  none   LDL 130, goal < 70  Now on lipitor 40  Continue statin at discharge  Other Stroke Risk Factors  Obesity   Other Active Problems  B/l knee pain  Hospital day # 0   Rosalin Hawking, MD PhD Stroke Neurology 01/10/2018 10:00 AM    To contact Stroke Continuity provider, please refer to [email or web address] After hours, contact General Neurology

## 2018-01-10 NOTE — Progress Notes (Addendum)
Family Medicine Teaching Service Daily Progress Note Intern Pager: [phone]  Patient name: Lindsay Travis Medical record number: [id number] Date of birth: 1961-07-24 Age: 57 y.o. Gender: female  Primary Care Provider: Nolene Ebbs, MD Consultants: neuro Code Status: full  Pt Overview and Major Events to Date:  5/3 admitted with unsteady gait 5/4 MRI with acute infarct  Assessment and Plan: Lindsay Travis is a 57 y.o. female presenting with subjective bilateral lower extremity weakness for 3 days. PMH is significant for hypertension and diabetes.  Gait abnormality: most likely 2/2 MRI changes suggestive of vascular infarct in R basal ganglia/corona radiata. Demyelinating disease less likely given acute onset. LDL at 130, lipitor started. No need for permissive HTN as onset >3 days ago.  - neuro following, anticipate need for additional vascular imaging - lipitor 40mg  - ASA - echo - PT/OT consult: outpatient PT  HTN: not controlled. Has tried ACEi and ARB but discontinued due to feeling "sick" when taking them. Denies headache, vision changes, chest pain.  - continue home HCTZ 25 mg  - Prn hydralazine for SBP>180, DBP>100  T2DM: Last A1c 8.6 12/19/17. Patient reports she was told her sugars were well enough controlled she does not need medication. She had been out of metformin for a few months prior to A1c being checked. - qACHS CBGs - sensitive SSI - hold metformin in case of imaging requiring contrast  Knee Pain: Tricompartmental degenerative changes. Improved status post knee injections last week.   FEN/GI: carb-modified/heart healthy Prophylaxis: lovenox  Disposition: continued inpatient workup for CVA  Subjective:  Patient surprised that this unsteady gait was likely due to a stroke. She denies hx of CVA. Not previously on a statin. Family coming later today, I offered to explain CVA and risk modification to them if she would like, she will have RN page.    Objective: Temp:  [98.3 F (36.8 C)-98.8 F (37.1 C)] 98.7 F (37.1 C) (05/04 0610) Pulse Rate:  [64-71] 67 (05/04 0610) Resp:  [16-18] 16 (05/04 0610) BP: (160-177)/(72-114) 162/72 (05/04 0610) SpO2:  [100 %] 100 % (05/04 0610) Weight:  [242 lb (109.8 kg)] 242 lb (109.8 kg) (05/03 1017) Physical Exam: General: Well appearing, sitting on EOB in NAD Cardiovascular: RRR, no murmur Respiratory: CTAB, easy WOB Abdomen: SNTND, +BS Extremities: no edema, some knee pain with standing  Laboratory: Recent Labs  Lab 01/09/18 1226 01/10/18 0447  WBC 4.0 4.8  HGB 13.2 12.9  HCT 39.6 39.3  PLT 226 209   Recent Labs  Lab 01/09/18 1226 01/10/18 0447  NA 140 139  K 3.7 3.7  CL 104 99*  CO2 27 30  BUN 13 14  CREATININE 0.66 0.89  CALCIUM 9.4 9.5  PROT 7.5  --   BILITOT 0.6  --   ALKPHOS 61  --   ALT 23  --   AST 17  --   GLUCOSE 159* 169*    Imaging/Diagnostic Tests: Mr Brain Wo Contrast  Result Date: 01/09/2018 CLINICAL DATA:  Initial evaluation for subjective bilateral lower extremity weakness for 3 days. EXAM: MRI HEAD WITHOUT CONTRAST MRI CERVICAL SPINE WITHOUT CONTRAST MRI THORACIC SPINE WITHOUT CONTRAST TECHNIQUE: Multiplanar, multiecho pulse sequences of the brain and surrounding structures, and cervical spine, to include the craniocervical junction and cervicothoracic junction, were obtained without intravenous contrast. COMPARISON:  None available. FINDINGS: MRI HEAD FINDINGS Brain: Cerebral volume within normal limits for age. Mild scattered nonspecific cerebral white matter changes present within the periventricular, deep, and subcortical white matter of both  cerebral hemispheres. There is a 710 mm focus of restricted diffusion involving the right basal ganglia, with involvement of the anterior right lentiform nucleus and caudate (series 5001, image 64). Additional 12 mm focus of restricted diffusion extends from the posterior right lentiform nucleus towards the  posterior right corona radiata (series 5001, image 65). Findings most likely reflect sequelae of acute vascular ischemia, although active demyelination could conceivably be considered as well. No other evidence for acute or subacute ischemia. Gray-white matter differentiation maintained. No other areas of chronic infarction. No acute or chronic intracranial hemorrhage. No mass lesion, midline shift or mass effect. No hydrocephalus. No extra-axial fluid collection. Major dural sinuses grossly patent. Pituitary gland suprasellar region normal. Midline structures intact and normal. Vascular: Major intracranial vascular flow voids are maintained. Skull and upper cervical spine: Craniocervical junction normal. Bone marrow signal intensity within normal limits. No scalp soft tissue abnormality. Sinuses/Orbits: Globes and orbital soft tissues within normal limits. Paranasal sinuses are clear. No mastoid effusion. Inner ear structures normal. Other: None. MRI CERVICAL SPINE FINDINGS Alignment: Straightening of the normal cervical lordosis. No listhesis. Vertebrae: Vertebral body heights maintained without evidence for acute or chronic fracture. Bone marrow signal intensity within normal limits. No discrete or worrisome osseous lesions. No abnormal marrow edema. Cord: Signal intensity within the cervical spinal cord is normal. No cord signal abnormality to suggest demyelinating disease. Cord caliber within normal limits without cord expansion or edema. Posterior Fossa, vertebral arteries, paraspinal tissues: Paraspinous and prevertebral soft tissues within normal limits. Normal intravascular flow voids present within the vertebral arteries bilaterally. Disc levels: C2-C3: Minimal disc bulge with uncovertebral hypertrophy. No significant canal stenosis. Mild left with moderate right C3 foraminal narrowing. C3-C4: Diffuse disc bulge with bilateral uncovertebral hypertrophy. No canal stenosis. Moderate bilateral C4 foraminal  narrowing, greater on the right. C4-C5: Diffuse disc bulge with bilateral uncovertebral hypertrophy. No significant canal stenosis. Mild-to-moderate bilateral C5 foraminal stenosis. C5-C6: Mild diffuse disc bulge. Right greater than left facet hypertrophy. No canal stenosis. Mild right C6 foraminal narrowing. No significant left foraminal encroachment. C6-C7: Broad central disc protrusion indents the ventral thecal sac, abutting the ventral spinal cord. Mild spinal stenosis. Foramina remain patent. C7-T1: Minimal disc bulge. Right-sided facet hypertrophy. Resultant mild to moderate right C8 foraminal stenosis. No significant left foraminal encroachment. No canal stenosis. MRI THORACIC SPINE FINDINGS Alignment: Normal alignment with preservation of the normal thoracic kyphosis. No listhesis. Vertebrae: Vertebral body heights maintained without evidence for acute or chronic fracture. Bone marrow signal intensity within normal limits. Mild reactive endplate changes present about the T7-8, T10-11, and T11-12 interspaces. No discrete or worrisome osseous lesions. No abnormal marrow edema. Cord: Signal intensity within the thoracic spinal cord is normal. Normal cord caliber. No findings to suggest demyelinating disease. No abnormal cord expansion or edema. Paraspinous soft tissues: Paraspinous soft tissues within normal limits. Visualized lungs are clear. Visualized visceral structures unremarkable. Disc levels: T1-2:  . unremarkable. T2-3: Unremarkable. T3-4:  Unremarkable. T4-5:  Unremarkable. T5-6: Diffuse disc bulge, asymmetric to the right. Effacement of the ventral CSF with mild stenosis. Foramina remain patent. T6-7: Diffuse disc bulge with superimposed broad left paracentral disc protrusion. Mild spinal stenosis with mild flattening of the left hemi cord. Foramina remain patent. T7-8: Diffuse disc bulge. Mild bilateral facet hypertrophy. Resultant mild spinal stenosis. Mild right foraminal narrowing. T8-9: Diffuse  disc bulge. Mild facet hypertrophy. Mild spinal stenosis. T9-10: Mild diffuse disc bulge. Mild facet hypertrophy. Mild spinal stenosis. Mild left with moderate right foraminal narrowing. T10-11:  Mild disc bulge. Right-sided facet hypertrophy. No significant canal stenosis. Mild right foraminal narrowing. T11-12: Mild disc bulge. Bilateral facet hypertrophy, worse on the right. No canal stenosis. Mild bilateral foraminal narrowing, worse on the right. T12-L1:  Unremarkable. IMPRESSION: MRI HEAD IMPRESSION 1. Two separate areas of restricted diffusion involving the right basal ganglia/corona radiata as above, favored to reflect acute ischemic vascular infarcts. Demyelinating disease could conceivably have this appearance as well, and could be considered in the correct clinical setting. 2. Additional mild nonspecific cerebral white matter changes. MRI CERVICAL SPINE IMPRESSION 1. No acute abnormality within the cervical spine. Normal MRI appearance of the cervical spinal cord without evidence for demyelinating disease. 2. Broad central disc protrusion at C6-7 with resultant mild spinal stenosis. 3. Multifactorial degenerative changes with resultant mild to moderate multilevel foraminal narrowing as above. MRI THORACIC SPINE IMPRESSION 1. No acute abnormality within the thoracic spine. Normal MRI appearance of the thoracic spinal cord without evidence for demyelinating disease. 2. Multilevel disc bulging/disc protrusions at T5-6 through T11-12 with resultant mild multilevel spinal and foraminal stenosis as above. Electronically Signed   By: Jeannine Boga M.D.   On: 01/09/2018 22:27   Mr Cervical Spine Wo Contrast  Result Date: 01/09/2018 CLINICAL DATA:  Initial evaluation for subjective bilateral lower extremity weakness for 3 days. EXAM: MRI HEAD WITHOUT CONTRAST MRI CERVICAL SPINE WITHOUT CONTRAST MRI THORACIC SPINE WITHOUT CONTRAST TECHNIQUE: Multiplanar, multiecho pulse sequences of the brain and  surrounding structures, and cervical spine, to include the craniocervical junction and cervicothoracic junction, were obtained without intravenous contrast. COMPARISON:  None available. FINDINGS: MRI HEAD FINDINGS Brain: Cerebral volume within normal limits for age. Mild scattered nonspecific cerebral white matter changes present within the periventricular, deep, and subcortical white matter of both cerebral hemispheres. There is a 710 mm focus of restricted diffusion involving the right basal ganglia, with involvement of the anterior right lentiform nucleus and caudate (series 5001, image 64). Additional 12 mm focus of restricted diffusion extends from the posterior right lentiform nucleus towards the posterior right corona radiata (series 5001, image 65). Findings most likely reflect sequelae of acute vascular ischemia, although active demyelination could conceivably be considered as well. No other evidence for acute or subacute ischemia. Gray-white matter differentiation maintained. No other areas of chronic infarction. No acute or chronic intracranial hemorrhage. No mass lesion, midline shift or mass effect. No hydrocephalus. No extra-axial fluid collection. Major dural sinuses grossly patent. Pituitary gland suprasellar region normal. Midline structures intact and normal. Vascular: Major intracranial vascular flow voids are maintained. Skull and upper cervical spine: Craniocervical junction normal. Bone marrow signal intensity within normal limits. No scalp soft tissue abnormality. Sinuses/Orbits: Globes and orbital soft tissues within normal limits. Paranasal sinuses are clear. No mastoid effusion. Inner ear structures normal. Other: None. MRI CERVICAL SPINE FINDINGS Alignment: Straightening of the normal cervical lordosis. No listhesis. Vertebrae: Vertebral body heights maintained without evidence for acute or chronic fracture. Bone marrow signal intensity within normal limits. No discrete or worrisome  osseous lesions. No abnormal marrow edema. Cord: Signal intensity within the cervical spinal cord is normal. No cord signal abnormality to suggest demyelinating disease. Cord caliber within normal limits without cord expansion or edema. Posterior Fossa, vertebral arteries, paraspinal tissues: Paraspinous and prevertebral soft tissues within normal limits. Normal intravascular flow voids present within the vertebral arteries bilaterally. Disc levels: C2-C3: Minimal disc bulge with uncovertebral hypertrophy. No significant canal stenosis. Mild left with moderate right C3 foraminal narrowing. C3-C4: Diffuse disc bulge with bilateral uncovertebral  hypertrophy. No canal stenosis. Moderate bilateral C4 foraminal narrowing, greater on the right. C4-C5: Diffuse disc bulge with bilateral uncovertebral hypertrophy. No significant canal stenosis. Mild-to-moderate bilateral C5 foraminal stenosis. C5-C6: Mild diffuse disc bulge. Right greater than left facet hypertrophy. No canal stenosis. Mild right C6 foraminal narrowing. No significant left foraminal encroachment. C6-C7: Broad central disc protrusion indents the ventral thecal sac, abutting the ventral spinal cord. Mild spinal stenosis. Foramina remain patent. C7-T1: Minimal disc bulge. Right-sided facet hypertrophy. Resultant mild to moderate right C8 foraminal stenosis. No significant left foraminal encroachment. No canal stenosis. MRI THORACIC SPINE FINDINGS Alignment: Normal alignment with preservation of the normal thoracic kyphosis. No listhesis. Vertebrae: Vertebral body heights maintained without evidence for acute or chronic fracture. Bone marrow signal intensity within normal limits. Mild reactive endplate changes present about the T7-8, T10-11, and T11-12 interspaces. No discrete or worrisome osseous lesions. No abnormal marrow edema. Cord: Signal intensity within the thoracic spinal cord is normal. Normal cord caliber. No findings to suggest demyelinating disease.  No abnormal cord expansion or edema. Paraspinous soft tissues: Paraspinous soft tissues within normal limits. Visualized lungs are clear. Visualized visceral structures unremarkable. Disc levels: T1-2:  . unremarkable. T2-3: Unremarkable. T3-4:  Unremarkable. T4-5:  Unremarkable. T5-6: Diffuse disc bulge, asymmetric to the right. Effacement of the ventral CSF with mild stenosis. Foramina remain patent. T6-7: Diffuse disc bulge with superimposed broad left paracentral disc protrusion. Mild spinal stenosis with mild flattening of the left hemi cord. Foramina remain patent. T7-8: Diffuse disc bulge. Mild bilateral facet hypertrophy. Resultant mild spinal stenosis. Mild right foraminal narrowing. T8-9: Diffuse disc bulge. Mild facet hypertrophy. Mild spinal stenosis. T9-10: Mild diffuse disc bulge. Mild facet hypertrophy. Mild spinal stenosis. Mild left with moderate right foraminal narrowing. T10-11: Mild disc bulge. Right-sided facet hypertrophy. No significant canal stenosis. Mild right foraminal narrowing. T11-12: Mild disc bulge. Bilateral facet hypertrophy, worse on the right. No canal stenosis. Mild bilateral foraminal narrowing, worse on the right. T12-L1:  Unremarkable. IMPRESSION: MRI HEAD IMPRESSION 1. Two separate areas of restricted diffusion involving the right basal ganglia/corona radiata as above, favored to reflect acute ischemic vascular infarcts. Demyelinating disease could conceivably have this appearance as well, and could be considered in the correct clinical setting. 2. Additional mild nonspecific cerebral white matter changes. MRI CERVICAL SPINE IMPRESSION 1. No acute abnormality within the cervical spine. Normal MRI appearance of the cervical spinal cord without evidence for demyelinating disease. 2. Broad central disc protrusion at C6-7 with resultant mild spinal stenosis. 3. Multifactorial degenerative changes with resultant mild to moderate multilevel foraminal narrowing as above. MRI  THORACIC SPINE IMPRESSION 1. No acute abnormality within the thoracic spine. Normal MRI appearance of the thoracic spinal cord without evidence for demyelinating disease. 2. Multilevel disc bulging/disc protrusions at T5-6 through T11-12 with resultant mild multilevel spinal and foraminal stenosis as above. Electronically Signed   By: Jeannine Boga M.D.   On: 01/09/2018 22:27   Mr Thoracic Spine Wo Contrast  Result Date: 01/09/2018 CLINICAL DATA:  Initial evaluation for subjective bilateral lower extremity weakness for 3 days. EXAM: MRI HEAD WITHOUT CONTRAST MRI CERVICAL SPINE WITHOUT CONTRAST MRI THORACIC SPINE WITHOUT CONTRAST TECHNIQUE: Multiplanar, multiecho pulse sequences of the brain and surrounding structures, and cervical spine, to include the craniocervical junction and cervicothoracic junction, were obtained without intravenous contrast. COMPARISON:  None available. FINDINGS: MRI HEAD FINDINGS Brain: Cerebral volume within normal limits for age. Mild scattered nonspecific cerebral white matter changes present within the periventricular, deep, and subcortical white  matter of both cerebral hemispheres. There is a 710 mm focus of restricted diffusion involving the right basal ganglia, with involvement of the anterior right lentiform nucleus and caudate (series 5001, image 64). Additional 12 mm focus of restricted diffusion extends from the posterior right lentiform nucleus towards the posterior right corona radiata (series 5001, image 65). Findings most likely reflect sequelae of acute vascular ischemia, although active demyelination could conceivably be considered as well. No other evidence for acute or subacute ischemia. Gray-white matter differentiation maintained. No other areas of chronic infarction. No acute or chronic intracranial hemorrhage. No mass lesion, midline shift or mass effect. No hydrocephalus. No extra-axial fluid collection. Major dural sinuses grossly patent. Pituitary gland  suprasellar region normal. Midline structures intact and normal. Vascular: Major intracranial vascular flow voids are maintained. Skull and upper cervical spine: Craniocervical junction normal. Bone marrow signal intensity within normal limits. No scalp soft tissue abnormality. Sinuses/Orbits: Globes and orbital soft tissues within normal limits. Paranasal sinuses are clear. No mastoid effusion. Inner ear structures normal. Other: None. MRI CERVICAL SPINE FINDINGS Alignment: Straightening of the normal cervical lordosis. No listhesis. Vertebrae: Vertebral body heights maintained without evidence for acute or chronic fracture. Bone marrow signal intensity within normal limits. No discrete or worrisome osseous lesions. No abnormal marrow edema. Cord: Signal intensity within the cervical spinal cord is normal. No cord signal abnormality to suggest demyelinating disease. Cord caliber within normal limits without cord expansion or edema. Posterior Fossa, vertebral arteries, paraspinal tissues: Paraspinous and prevertebral soft tissues within normal limits. Normal intravascular flow voids present within the vertebral arteries bilaterally. Disc levels: C2-C3: Minimal disc bulge with uncovertebral hypertrophy. No significant canal stenosis. Mild left with moderate right C3 foraminal narrowing. C3-C4: Diffuse disc bulge with bilateral uncovertebral hypertrophy. No canal stenosis. Moderate bilateral C4 foraminal narrowing, greater on the right. C4-C5: Diffuse disc bulge with bilateral uncovertebral hypertrophy. No significant canal stenosis. Mild-to-moderate bilateral C5 foraminal stenosis. C5-C6: Mild diffuse disc bulge. Right greater than left facet hypertrophy. No canal stenosis. Mild right C6 foraminal narrowing. No significant left foraminal encroachment. C6-C7: Broad central disc protrusion indents the ventral thecal sac, abutting the ventral spinal cord. Mild spinal stenosis. Foramina remain patent. C7-T1: Minimal disc  bulge. Right-sided facet hypertrophy. Resultant mild to moderate right C8 foraminal stenosis. No significant left foraminal encroachment. No canal stenosis. MRI THORACIC SPINE FINDINGS Alignment: Normal alignment with preservation of the normal thoracic kyphosis. No listhesis. Vertebrae: Vertebral body heights maintained without evidence for acute or chronic fracture. Bone marrow signal intensity within normal limits. Mild reactive endplate changes present about the T7-8, T10-11, and T11-12 interspaces. No discrete or worrisome osseous lesions. No abnormal marrow edema. Cord: Signal intensity within the thoracic spinal cord is normal. Normal cord caliber. No findings to suggest demyelinating disease. No abnormal cord expansion or edema. Paraspinous soft tissues: Paraspinous soft tissues within normal limits. Visualized lungs are clear. Visualized visceral structures unremarkable. Disc levels: T1-2:  . unremarkable. T2-3: Unremarkable. T3-4:  Unremarkable. T4-5:  Unremarkable. T5-6: Diffuse disc bulge, asymmetric to the right. Effacement of the ventral CSF with mild stenosis. Foramina remain patent. T6-7: Diffuse disc bulge with superimposed broad left paracentral disc protrusion. Mild spinal stenosis with mild flattening of the left hemi cord. Foramina remain patent. T7-8: Diffuse disc bulge. Mild bilateral facet hypertrophy. Resultant mild spinal stenosis. Mild right foraminal narrowing. T8-9: Diffuse disc bulge. Mild facet hypertrophy. Mild spinal stenosis. T9-10: Mild diffuse disc bulge. Mild facet hypertrophy. Mild spinal stenosis. Mild left with moderate right foraminal  narrowing. T10-11: Mild disc bulge. Right-sided facet hypertrophy. No significant canal stenosis. Mild right foraminal narrowing. T11-12: Mild disc bulge. Bilateral facet hypertrophy, worse on the right. No canal stenosis. Mild bilateral foraminal narrowing, worse on the right. T12-L1:  Unremarkable. IMPRESSION: MRI HEAD IMPRESSION 1. Two  separate areas of restricted diffusion involving the right basal ganglia/corona radiata as above, favored to reflect acute ischemic vascular infarcts. Demyelinating disease could conceivably have this appearance as well, and could be considered in the correct clinical setting. 2. Additional mild nonspecific cerebral white matter changes. MRI CERVICAL SPINE IMPRESSION 1. No acute abnormality within the cervical spine. Normal MRI appearance of the cervical spinal cord without evidence for demyelinating disease. 2. Broad central disc protrusion at C6-7 with resultant mild spinal stenosis. 3. Multifactorial degenerative changes with resultant mild to moderate multilevel foraminal narrowing as above. MRI THORACIC SPINE IMPRESSION 1. No acute abnormality within the thoracic spine. Normal MRI appearance of the thoracic spinal cord without evidence for demyelinating disease. 2. Multilevel disc bulging/disc protrusions at T5-6 through T11-12 with resultant mild multilevel spinal and foraminal stenosis as above. Electronically Signed   By: Jeannine Boga M.D.   On: 01/09/2018 22:27   Dg Knee Ap/lat W/sunrise Left  Result Date: 12/31/2017 CLINICAL DATA:  Knee pain for 1 month, no known injury, initial encounter EXAM: LEFT KNEE 3 VIEWS COMPARISON:  05/26/2012 FINDINGS: Tricompartmental degenerative changes are noted. These have increased slightly in the interval from the prior exam. No joint effusion is seen. No acute fracture is noted. IMPRESSION: Slight progression of degenerative changes as described. Electronically Signed   By: Inez Catalina M.D.   On: 12/31/2017 15:57   Dg Knee Ap/lat W/sunrise Right  Result Date: 12/31/2017 CLINICAL DATA:  Bilateral knee pain for 1 month EXAM: RIGHT KNEE 3 VIEWS COMPARISON:  05/26/2012 FINDINGS: Tricompartmental degenerative changes are noted which have progressed in the interval from the prior exam. No acute fracture or dislocation is noted. Small joint effusion is noted. No  soft tissue abnormality is seen. IMPRESSION: Tricompartmental degenerative change progressed in the interval from the prior exam. Small joint effusion is noted. Electronically Signed   By: Inez Catalina M.D.   On: 12/31/2017 15:56    Sela Hilding, MD 01/10/2018, 8:33 AM PGY-2, [place] Intern pager: [phone], text pages welcome

## 2018-01-11 ENCOUNTER — Observation Stay (HOSPITAL_BASED_OUTPATIENT_CLINIC_OR_DEPARTMENT_OTHER): Payer: BLUE CROSS/BLUE SHIELD

## 2018-01-11 DIAGNOSIS — I6330 Cerebral infarction due to thrombosis of unspecified cerebral artery: Secondary | ICD-10-CM | POA: Diagnosis not present

## 2018-01-11 DIAGNOSIS — Z886 Allergy status to analgesic agent status: Secondary | ICD-10-CM | POA: Diagnosis not present

## 2018-01-11 DIAGNOSIS — E119 Type 2 diabetes mellitus without complications: Secondary | ICD-10-CM | POA: Diagnosis not present

## 2018-01-11 DIAGNOSIS — E785 Hyperlipidemia, unspecified: Secondary | ICD-10-CM | POA: Diagnosis not present

## 2018-01-11 DIAGNOSIS — I10 Essential (primary) hypertension: Secondary | ICD-10-CM | POA: Diagnosis not present

## 2018-01-11 DIAGNOSIS — R262 Difficulty in walking, not elsewhere classified: Secondary | ICD-10-CM | POA: Diagnosis not present

## 2018-01-11 DIAGNOSIS — Z7902 Long term (current) use of antithrombotics/antiplatelets: Secondary | ICD-10-CM | POA: Diagnosis not present

## 2018-01-11 DIAGNOSIS — R2681 Unsteadiness on feet: Secondary | ICD-10-CM | POA: Diagnosis not present

## 2018-01-11 DIAGNOSIS — R269 Unspecified abnormalities of gait and mobility: Secondary | ICD-10-CM | POA: Diagnosis not present

## 2018-01-11 DIAGNOSIS — M25562 Pain in left knee: Secondary | ICD-10-CM | POA: Diagnosis not present

## 2018-01-11 DIAGNOSIS — M25561 Pain in right knee: Secondary | ICD-10-CM | POA: Diagnosis not present

## 2018-01-11 DIAGNOSIS — I639 Cerebral infarction, unspecified: Secondary | ICD-10-CM | POA: Diagnosis not present

## 2018-01-11 DIAGNOSIS — E118 Type 2 diabetes mellitus with unspecified complications: Secondary | ICD-10-CM | POA: Diagnosis not present

## 2018-01-11 DIAGNOSIS — Z79899 Other long term (current) drug therapy: Secondary | ICD-10-CM | POA: Diagnosis not present

## 2018-01-11 DIAGNOSIS — Z888 Allergy status to other drugs, medicaments and biological substances status: Secondary | ICD-10-CM | POA: Diagnosis not present

## 2018-01-11 DIAGNOSIS — Z7982 Long term (current) use of aspirin: Secondary | ICD-10-CM | POA: Diagnosis not present

## 2018-01-11 LAB — BASIC METABOLIC PANEL
Anion gap: 10 (ref 5–15)
BUN: 15 mg/dL (ref 6–20)
CALCIUM: 9.6 mg/dL (ref 8.9–10.3)
CO2: 29 mmol/L (ref 22–32)
CREATININE: 0.88 mg/dL (ref 0.44–1.00)
Chloride: 98 mmol/L — ABNORMAL LOW (ref 101–111)
GFR calc Af Amer: >60 mL/min (ref >60)
Glucose, Bld: 161 mg/dL — ABNORMAL HIGH (ref 65–99)
Potassium: 3.9 mmol/L (ref 3.5–5.1)
Sodium: 137 mmol/L (ref 135–145)

## 2018-01-11 LAB — GLUCOSE, CAPILLARY
GLUCOSE-CAPILLARY: 177 mg/dL — AB (ref 65–99)
Glucose-Capillary: 193 mg/dL — ABNORMAL HIGH (ref 65–99)

## 2018-01-11 LAB — ECHOCARDIOGRAM COMPLETE
HEIGHTINCHES: 65 in
WEIGHTICAEL: 3872 [oz_av]

## 2018-01-11 LAB — CBC
HCT: 42.8 % (ref 36.0–46.0)
Hemoglobin: 14.1 g/dL (ref 12.0–15.0)
MCH: 27.5 pg (ref 26.0–34.0)
MCHC: 32.9 g/dL (ref 30.0–36.0)
MCV: 83.4 fL (ref 78.0–100.0)
PLATELETS: 231 10*3/uL (ref 150–400)
RBC: 5.13 MIL/uL — AB (ref 3.87–5.11)
RDW: 14.4 % (ref 11.5–15.5)
WBC: 4.8 10*3/uL (ref 4.0–10.5)

## 2018-01-11 MED ORDER — AMLODIPINE BESYLATE 10 MG PO TABS: 10.0000 mg | ORAL_TABLET | Freq: Every day | ORAL | 0 refills | Status: DC

## 2018-01-11 MED ORDER — CLOPIDOGREL BISULFATE 75 MG PO TABS: 75.0000 mg | ORAL_TABLET | Freq: Every day | ORAL | 0 refills | Status: AC

## 2018-01-11 MED ORDER — HYDROCHLOROTHIAZIDE 25 MG PO TABS: 25.0000 mg | ORAL_TABLET | Freq: Every day | ORAL | 0 refills | Status: DC

## 2018-01-11 MED ORDER — ATORVASTATIN CALCIUM 40 MG PO TABS: 40.0000 mg | ORAL_TABLET | Freq: Every day | ORAL | 0 refills | Status: DC

## 2018-01-11 MED ORDER — ASPIRIN 81 MG PO TBEC: 81.0000 mg | DELAYED_RELEASE_TABLET | Freq: Every day | ORAL | 1 refills | Status: DC

## 2018-01-11 NOTE — Progress Notes (Signed)
  Echocardiogram 2D Echocardiogram has been performed.  Darlina Sicilian M 01/11/2018, 3:09 PM

## 2018-01-11 NOTE — Progress Notes (Signed)
STROKE TEAM PROGRESS NOTE   SUBJECTIVE (INTERVAL HISTORY) No family is at the bedside.  Overall she feels her condition is at baseline. She still has b/l knee pain and PT recommend outpt PT.   OBJECTIVE Temp:  [97.4 F (36.3 C)-98.5 F (36.9 C)] 98.4 F (36.9 C) (05/05 0423) Pulse Rate:  [72-86] 86 (05/05 0738) Resp:  [14-15] 15 (05/05 0423) BP: (153-168)/(96-108) 160/101 (05/05 0738) SpO2:  [93 %-100 %] 100 % (05/05 0738)  Recent Labs  Lab 01/10/18 0805 01/10/18 1151 01/10/18 1727 01/10/18 2221 01/11/18 0731  GLUCAP 171* 148* 188* 249* 177*   Recent Labs  Lab 01/09/18 1226 01/10/18 0447 01/11/18 0405  NA 140 139 137  K 3.7 3.7 3.9  CL 104 99* 98*  CO2 27 30 29   GLUCOSE 159* 169* 161*  BUN 13 14 15   CREATININE 0.66 0.89 0.88  CALCIUM 9.4 9.5 9.6   Recent Labs  Lab 01/09/18 1226  AST 17  ALT 23  ALKPHOS 61  BILITOT 0.6  PROT 7.5  ALBUMIN 3.6   Recent Labs  Lab 01/09/18 1226 01/10/18 0447 01/11/18 0405  WBC 4.0 4.8 4.8  HGB 13.2 12.9 14.1  HCT 39.6 39.3 42.8  MCV 84.1 83.8 83.4  PLT 226 209 231   Recent Labs  Lab 01/09/18 1226  CKTOTAL 80   No results for input(s): LABPROT, INR in the last 72 hours. No results for input(s): COLORURINE, LABSPEC, [place], GLUCOSEU, HGBUR, BILIRUBINUR, KETONESUR, PROTEINUR, UROBILINOGEN, NITRITE, LEUKOCYTESUR in the last 72 hours.  Invalid input(s): APPERANCEUR     Component Value Date/Time   CHOL 205 (H) 01/10/2018 0447   TRIG 60 01/10/2018 0447   HDL 63 01/10/2018 0447   CHOLHDL 3.3 01/10/2018 0447   VLDL 12 01/10/2018 0447   LDLCALC 130 (H) 01/10/2018 0447   Lab Results  Component Value Date   HGBA1C 8.0 (H) 01/09/2018   No results found for: LABOPIA, COCAINSCRNUR, LABBENZ, AMPHETMU, THCU, LABBARB  No results for input(s): ETH in the last 168 hours.  I have personally reviewed the radiological images below and agree with the radiology interpretations.  Ct Angio Head W Or Wo Contrast  Result Date:  01/10/2018 CLINICAL DATA:  Right centrum semi ovale and internal capsule acute/subacute nonhemorrhagic white matter infarcts. EXAM: CT ANGIOGRAPHY HEAD AND NECK TECHNIQUE: Multidetector CT imaging of the head and neck was performed using the standard protocol during bolus administration of intravenous contrast. Multiplanar CT image reconstructions and MIPs were obtained to evaluate the vascular anatomy. Carotid stenosis measurements (when applicable) are obtained utilizing NASCET criteria, using the distal internal carotid diameter as the denominator. CONTRAST:  44mL ISOVUE-370 IOPAMIDOL (ISOVUE-370) INJECTION 76% COMPARISON:  None. FINDINGS: CT HEAD FINDINGS Brain: White matter infarcts involving the right internal capsule and centrum semi ovale are again noted. There is no interval hemorrhage. No new infarcts are present. Basal ganglia are intact. Insular ribbon is normal bilaterally. Ventricles are of normal size. No significant extra-axial fluid collection is present. Brainstem and cerebellum are normal. Vascular: No hyperdense vessel or unexpected calcification. Skull: Calvarium is intact. No acute or healing fractures are present. No focal lytic or blastic lesions are evident. Sinuses: The paranasal sinuses and mastoid air cells are clear. Orbits: Globes and orbits are within normal limits. Review of the MIP images confirms the above findings CTA NECK FINDINGS Aortic arch: A 3 vessel arch configuration is present. There is no significant atherosclerotic disease or stenosis at the aortic arch. Right carotid system: The right common carotid  artery is within normal limits. Noncalcified plaque is present bifurcation without a significant stenosis. There is tortuosity of the cervical right ICA without significant stenosis. Left carotid system: The left common carotid artery is within normal limits. Bifurcation is unremarkable. The cervical left ICA is tortuous without significant stenosis. Vertebral arteries:  Vertebral arteries both originate from the subclavian arteries without significant stenosis. There is proximal tortuosity bilaterally. No focal stenosis is present in either vertebral artery in the neck. Vertebral arteries are codominant. Skeleton: Vertebral body heights and alignment are maintained. No focal lytic or blastic lesions are present. Other neck: The soft tissues the neck are otherwise unremarkable. No focal mucosal or submucosal lesions are present. Salivary glands are within normal limits. Thyroid is unremarkable. No significant adenopathy is present. Upper chest: Mild dependent atelectasis is present. Lung apices are otherwise clear. The thoracic inlet is within normal limits. Review of the MIP images confirms the above findings CTA HEAD FINDINGS Anterior circulation: Internal carotid arteries are within normal limits from the skull base through the ICA termini bilaterally. The right A1 and M1 segments are normal. The anterior communicating artery is patent. There is a moderate proximal left M1 segment stenosis. There is a moderate distal left A1 segment stenosis. MCA bifurcations are intact. ACA and MCA branch vessels are unremarkable. Posterior circulation: The vertebral arteries are codominant. PICA origins are visualized and normal. The right posterior cerebral artery originates from basilar tip. The left posterior cerebral artery is of fetal type. The PCA branch vessels are normal bilaterally. Venous sinuses: Dural sinuses are patent bilaterally. Straight sinus and deep cerebral veins are intact. Cortical veins are unremarkable. Anatomic variants: Fetal type left posterior cerebral artery. Delayed phase: The postcontrast images demonstrate no pathologic enhancement. Review of the MIP images confirms the above findings IMPRESSION: 1. Expected evolution of nonhemorrhagic infarcts involving the anterior limb of the right internal capsule in the posterior centrum semi ovale. 2. Moderate stenosis of  the proximal left M1 segment. Distal MCA branch vessels are intact. 3. Moderate stenosis of the distal left A1 segment. ACA branch vessels are normal bilaterally with a patent anterior communicating artery. 4. Atherosclerotic changes at the carotid bifurcations bilaterally without significant stenosis. 5. Tortuosity of the cervical internal carotid arteries without significant stenosis. This is nonspecific, but most commonly seen in the setting of hypertension. Electronically Signed   By: San Morelle M.D.   On: 01/10/2018 13:42   Ct Angio Neck W Or Wo Contrast  Result Date: 01/10/2018 CLINICAL DATA:  Right centrum semi ovale and internal capsule acute/subacute nonhemorrhagic white matter infarcts. EXAM: CT ANGIOGRAPHY HEAD AND NECK TECHNIQUE: Multidetector CT imaging of the head and neck was performed using the standard protocol during bolus administration of intravenous contrast. Multiplanar CT image reconstructions and MIPs were obtained to evaluate the vascular anatomy. Carotid stenosis measurements (when applicable) are obtained utilizing NASCET criteria, using the distal internal carotid diameter as the denominator. CONTRAST:  86mL ISOVUE-370 IOPAMIDOL (ISOVUE-370) INJECTION 76% COMPARISON:  None. FINDINGS: CT HEAD FINDINGS Brain: White matter infarcts involving the right internal capsule and centrum semi ovale are again noted. There is no interval hemorrhage. No new infarcts are present. Basal ganglia are intact. Insular ribbon is normal bilaterally. Ventricles are of normal size. No significant extra-axial fluid collection is present. Brainstem and cerebellum are normal. Vascular: No hyperdense vessel or unexpected calcification. Skull: Calvarium is intact. No acute or healing fractures are present. No focal lytic or blastic lesions are evident. Sinuses: The paranasal sinuses and mastoid  air cells are clear. Orbits: Globes and orbits are within normal limits. Review of the MIP images confirms the  above findings CTA NECK FINDINGS Aortic arch: A 3 vessel arch configuration is present. There is no significant atherosclerotic disease or stenosis at the aortic arch. Right carotid system: The right common carotid artery is within normal limits. Noncalcified plaque is present bifurcation without a significant stenosis. There is tortuosity of the cervical right ICA without significant stenosis. Left carotid system: The left common carotid artery is within normal limits. Bifurcation is unremarkable. The cervical left ICA is tortuous without significant stenosis. Vertebral arteries: Vertebral arteries both originate from the subclavian arteries without significant stenosis. There is proximal tortuosity bilaterally. No focal stenosis is present in either vertebral artery in the neck. Vertebral arteries are codominant. Skeleton: Vertebral body heights and alignment are maintained. No focal lytic or blastic lesions are present. Other neck: The soft tissues the neck are otherwise unremarkable. No focal mucosal or submucosal lesions are present. Salivary glands are within normal limits. Thyroid is unremarkable. No significant adenopathy is present. Upper chest: Mild dependent atelectasis is present. Lung apices are otherwise clear. The thoracic inlet is within normal limits. Review of the MIP images confirms the above findings CTA HEAD FINDINGS Anterior circulation: Internal carotid arteries are within normal limits from the skull base through the ICA termini bilaterally. The right A1 and M1 segments are normal. The anterior communicating artery is patent. There is a moderate proximal left M1 segment stenosis. There is a moderate distal left A1 segment stenosis. MCA bifurcations are intact. ACA and MCA branch vessels are unremarkable. Posterior circulation: The vertebral arteries are codominant. PICA origins are visualized and normal. The right posterior cerebral artery originates from basilar tip. The left posterior  cerebral artery is of fetal type. The PCA branch vessels are normal bilaterally. Venous sinuses: Dural sinuses are patent bilaterally. Straight sinus and deep cerebral veins are intact. Cortical veins are unremarkable. Anatomic variants: Fetal type left posterior cerebral artery. Delayed phase: The postcontrast images demonstrate no pathologic enhancement. Review of the MIP images confirms the above findings IMPRESSION: 1. Expected evolution of nonhemorrhagic infarcts involving the anterior limb of the right internal capsule in the posterior centrum semi ovale. 2. Moderate stenosis of the proximal left M1 segment. Distal MCA branch vessels are intact. 3. Moderate stenosis of the distal left A1 segment. ACA branch vessels are normal bilaterally with a patent anterior communicating artery. 4. Atherosclerotic changes at the carotid bifurcations bilaterally without significant stenosis. 5. Tortuosity of the cervical internal carotid arteries without significant stenosis. This is nonspecific, but most commonly seen in the setting of hypertension. Electronically Signed   By: San Morelle M.D.   On: 01/10/2018 13:42   Mr Brain Wo Contrast  Result Date: 01/09/2018 CLINICAL DATA:  Initial evaluation for subjective bilateral lower extremity weakness for 3 days. EXAM: MRI HEAD WITHOUT CONTRAST MRI CERVICAL SPINE WITHOUT CONTRAST MRI THORACIC SPINE WITHOUT CONTRAST TECHNIQUE: Multiplanar, multiecho pulse sequences of the brain and surrounding structures, and cervical spine, to include the craniocervical junction and cervicothoracic junction, were obtained without intravenous contrast. COMPARISON:  None available. FINDINGS: MRI HEAD FINDINGS Brain: Cerebral volume within normal limits for age. Mild scattered nonspecific cerebral white matter changes present within the periventricular, deep, and subcortical white matter of both cerebral hemispheres. There is a 710 mm focus of restricted diffusion involving the right  basal ganglia, with involvement of the anterior right lentiform nucleus and caudate (series 5001, image 64). Additional 12 mm  focus of restricted diffusion extends from the posterior right lentiform nucleus towards the posterior right corona radiata (series 5001, image 65). Findings most likely reflect sequelae of acute vascular ischemia, although active demyelination could conceivably be considered as well. No other evidence for acute or subacute ischemia. Gray-white matter differentiation maintained. No other areas of chronic infarction. No acute or chronic intracranial hemorrhage. No mass lesion, midline shift or mass effect. No hydrocephalus. No extra-axial fluid collection. Major dural sinuses grossly patent. Pituitary gland suprasellar region normal. Midline structures intact and normal. Vascular: Major intracranial vascular flow voids are maintained. Skull and upper cervical spine: Craniocervical junction normal. Bone marrow signal intensity within normal limits. No scalp soft tissue abnormality. Sinuses/Orbits: Globes and orbital soft tissues within normal limits. Paranasal sinuses are clear. No mastoid effusion. Inner ear structures normal. Other: None. MRI CERVICAL SPINE FINDINGS Alignment: Straightening of the normal cervical lordosis. No listhesis. Vertebrae: Vertebral body heights maintained without evidence for acute or chronic fracture. Bone marrow signal intensity within normal limits. No discrete or worrisome osseous lesions. No abnormal marrow edema. Cord: Signal intensity within the cervical spinal cord is normal. No cord signal abnormality to suggest demyelinating disease. Cord caliber within normal limits without cord expansion or edema. Posterior Fossa, vertebral arteries, paraspinal tissues: Paraspinous and prevertebral soft tissues within normal limits. Normal intravascular flow voids present within the vertebral arteries bilaterally. Disc levels: C2-C3: Minimal disc bulge with uncovertebral  hypertrophy. No significant canal stenosis. Mild left with moderate right C3 foraminal narrowing. C3-C4: Diffuse disc bulge with bilateral uncovertebral hypertrophy. No canal stenosis. Moderate bilateral C4 foraminal narrowing, greater on the right. C4-C5: Diffuse disc bulge with bilateral uncovertebral hypertrophy. No significant canal stenosis. Mild-to-moderate bilateral C5 foraminal stenosis. C5-C6: Mild diffuse disc bulge. Right greater than left facet hypertrophy. No canal stenosis. Mild right C6 foraminal narrowing. No significant left foraminal encroachment. C6-C7: Broad central disc protrusion indents the ventral thecal sac, abutting the ventral spinal cord. Mild spinal stenosis. Foramina remain patent. C7-T1: Minimal disc bulge. Right-sided facet hypertrophy. Resultant mild to moderate right C8 foraminal stenosis. No significant left foraminal encroachment. No canal stenosis. MRI THORACIC SPINE FINDINGS Alignment: Normal alignment with preservation of the normal thoracic kyphosis. No listhesis. Vertebrae: Vertebral body heights maintained without evidence for acute or chronic fracture. Bone marrow signal intensity within normal limits. Mild reactive endplate changes present about the T7-8, T10-11, and T11-12 interspaces. No discrete or worrisome osseous lesions. No abnormal marrow edema. Cord: Signal intensity within the thoracic spinal cord is normal. Normal cord caliber. No findings to suggest demyelinating disease. No abnormal cord expansion or edema. Paraspinous soft tissues: Paraspinous soft tissues within normal limits. Visualized lungs are clear. Visualized visceral structures unremarkable. Disc levels: T1-2:  . unremarkable. T2-3: Unremarkable. T3-4:  Unremarkable. T4-5:  Unremarkable. T5-6: Diffuse disc bulge, asymmetric to the right. Effacement of the ventral CSF with mild stenosis. Foramina remain patent. T6-7: Diffuse disc bulge with superimposed broad left paracentral disc protrusion. Mild  spinal stenosis with mild flattening of the left hemi cord. Foramina remain patent. T7-8: Diffuse disc bulge. Mild bilateral facet hypertrophy. Resultant mild spinal stenosis. Mild right foraminal narrowing. T8-9: Diffuse disc bulge. Mild facet hypertrophy. Mild spinal stenosis. T9-10: Mild diffuse disc bulge. Mild facet hypertrophy. Mild spinal stenosis. Mild left with moderate right foraminal narrowing. T10-11: Mild disc bulge. Right-sided facet hypertrophy. No significant canal stenosis. Mild right foraminal narrowing. T11-12: Mild disc bulge. Bilateral facet hypertrophy, worse on the right. No canal stenosis. Mild bilateral foraminal narrowing, worse on  the right. T12-L1:  Unremarkable. IMPRESSION: MRI HEAD IMPRESSION 1. Two separate areas of restricted diffusion involving the right basal ganglia/corona radiata as above, favored to reflect acute ischemic vascular infarcts. Demyelinating disease could conceivably have this appearance as well, and could be considered in the correct clinical setting. 2. Additional mild nonspecific cerebral white matter changes. MRI CERVICAL SPINE IMPRESSION 1. No acute abnormality within the cervical spine. Normal MRI appearance of the cervical spinal cord without evidence for demyelinating disease. 2. Broad central disc protrusion at C6-7 with resultant mild spinal stenosis. 3. Multifactorial degenerative changes with resultant mild to moderate multilevel foraminal narrowing as above. MRI THORACIC SPINE IMPRESSION 1. No acute abnormality within the thoracic spine. Normal MRI appearance of the thoracic spinal cord without evidence for demyelinating disease. 2. Multilevel disc bulging/disc protrusions at T5-6 through T11-12 with resultant mild multilevel spinal and foraminal stenosis as above. Electronically Signed   By: Jeannine Boga M.D.   On: 01/09/2018 22:27   Mr Cervical Spine Wo Contrast  Result Date: 01/09/2018 CLINICAL DATA:  Initial evaluation for subjective  bilateral lower extremity weakness for 3 days. EXAM: MRI HEAD WITHOUT CONTRAST MRI CERVICAL SPINE WITHOUT CONTRAST MRI THORACIC SPINE WITHOUT CONTRAST TECHNIQUE: Multiplanar, multiecho pulse sequences of the brain and surrounding structures, and cervical spine, to include the craniocervical junction and cervicothoracic junction, were obtained without intravenous contrast. COMPARISON:  None available. FINDINGS: MRI HEAD FINDINGS Brain: Cerebral volume within normal limits for age. Mild scattered nonspecific cerebral white matter changes present within the periventricular, deep, and subcortical white matter of both cerebral hemispheres. There is a 710 mm focus of restricted diffusion involving the right basal ganglia, with involvement of the anterior right lentiform nucleus and caudate (series 5001, image 64). Additional 12 mm focus of restricted diffusion extends from the posterior right lentiform nucleus towards the posterior right corona radiata (series 5001, image 65). Findings most likely reflect sequelae of acute vascular ischemia, although active demyelination could conceivably be considered as well. No other evidence for acute or subacute ischemia. Gray-white matter differentiation maintained. No other areas of chronic infarction. No acute or chronic intracranial hemorrhage. No mass lesion, midline shift or mass effect. No hydrocephalus. No extra-axial fluid collection. Major dural sinuses grossly patent. Pituitary gland suprasellar region normal. Midline structures intact and normal. Vascular: Major intracranial vascular flow voids are maintained. Skull and upper cervical spine: Craniocervical junction normal. Bone marrow signal intensity within normal limits. No scalp soft tissue abnormality. Sinuses/Orbits: Globes and orbital soft tissues within normal limits. Paranasal sinuses are clear. No mastoid effusion. Inner ear structures normal. Other: None. MRI CERVICAL SPINE FINDINGS Alignment: Straightening of  the normal cervical lordosis. No listhesis. Vertebrae: Vertebral body heights maintained without evidence for acute or chronic fracture. Bone marrow signal intensity within normal limits. No discrete or worrisome osseous lesions. No abnormal marrow edema. Cord: Signal intensity within the cervical spinal cord is normal. No cord signal abnormality to suggest demyelinating disease. Cord caliber within normal limits without cord expansion or edema. Posterior Fossa, vertebral arteries, paraspinal tissues: Paraspinous and prevertebral soft tissues within normal limits. Normal intravascular flow voids present within the vertebral arteries bilaterally. Disc levels: C2-C3: Minimal disc bulge with uncovertebral hypertrophy. No significant canal stenosis. Mild left with moderate right C3 foraminal narrowing. C3-C4: Diffuse disc bulge with bilateral uncovertebral hypertrophy. No canal stenosis. Moderate bilateral C4 foraminal narrowing, greater on the right. C4-C5: Diffuse disc bulge with bilateral uncovertebral hypertrophy. No significant canal stenosis. Mild-to-moderate bilateral C5 foraminal stenosis. C5-C6: Mild diffuse disc  bulge. Right greater than left facet hypertrophy. No canal stenosis. Mild right C6 foraminal narrowing. No significant left foraminal encroachment. C6-C7: Broad central disc protrusion indents the ventral thecal sac, abutting the ventral spinal cord. Mild spinal stenosis. Foramina remain patent. C7-T1: Minimal disc bulge. Right-sided facet hypertrophy. Resultant mild to moderate right C8 foraminal stenosis. No significant left foraminal encroachment. No canal stenosis. MRI THORACIC SPINE FINDINGS Alignment: Normal alignment with preservation of the normal thoracic kyphosis. No listhesis. Vertebrae: Vertebral body heights maintained without evidence for acute or chronic fracture. Bone marrow signal intensity within normal limits. Mild reactive endplate changes present about the T7-8, T10-11, and T11-12  interspaces. No discrete or worrisome osseous lesions. No abnormal marrow edema. Cord: Signal intensity within the thoracic spinal cord is normal. Normal cord caliber. No findings to suggest demyelinating disease. No abnormal cord expansion or edema. Paraspinous soft tissues: Paraspinous soft tissues within normal limits. Visualized lungs are clear. Visualized visceral structures unremarkable. Disc levels: T1-2:  . unremarkable. T2-3: Unremarkable. T3-4:  Unremarkable. T4-5:  Unremarkable. T5-6: Diffuse disc bulge, asymmetric to the right. Effacement of the ventral CSF with mild stenosis. Foramina remain patent. T6-7: Diffuse disc bulge with superimposed broad left paracentral disc protrusion. Mild spinal stenosis with mild flattening of the left hemi cord. Foramina remain patent. T7-8: Diffuse disc bulge. Mild bilateral facet hypertrophy. Resultant mild spinal stenosis. Mild right foraminal narrowing. T8-9: Diffuse disc bulge. Mild facet hypertrophy. Mild spinal stenosis. T9-10: Mild diffuse disc bulge. Mild facet hypertrophy. Mild spinal stenosis. Mild left with moderate right foraminal narrowing. T10-11: Mild disc bulge. Right-sided facet hypertrophy. No significant canal stenosis. Mild right foraminal narrowing. T11-12: Mild disc bulge. Bilateral facet hypertrophy, worse on the right. No canal stenosis. Mild bilateral foraminal narrowing, worse on the right. T12-L1:  Unremarkable. IMPRESSION: MRI HEAD IMPRESSION 1. Two separate areas of restricted diffusion involving the right basal ganglia/corona radiata as above, favored to reflect acute ischemic vascular infarcts. Demyelinating disease could conceivably have this appearance as well, and could be considered in the correct clinical setting. 2. Additional mild nonspecific cerebral white matter changes. MRI CERVICAL SPINE IMPRESSION 1. No acute abnormality within the cervical spine. Normal MRI appearance of the cervical spinal cord without evidence for  demyelinating disease. 2. Broad central disc protrusion at C6-7 with resultant mild spinal stenosis. 3. Multifactorial degenerative changes with resultant mild to moderate multilevel foraminal narrowing as above. MRI THORACIC SPINE IMPRESSION 1. No acute abnormality within the thoracic spine. Normal MRI appearance of the thoracic spinal cord without evidence for demyelinating disease. 2. Multilevel disc bulging/disc protrusions at T5-6 through T11-12 with resultant mild multilevel spinal and foraminal stenosis as above. Electronically Signed   By: Jeannine Boga M.D.   On: 01/09/2018 22:27   Mr Thoracic Spine Wo Contrast  Result Date: 01/09/2018 CLINICAL DATA:  Initial evaluation for subjective bilateral lower extremity weakness for 3 days. EXAM: MRI HEAD WITHOUT CONTRAST MRI CERVICAL SPINE WITHOUT CONTRAST MRI THORACIC SPINE WITHOUT CONTRAST TECHNIQUE: Multiplanar, multiecho pulse sequences of the brain and surrounding structures, and cervical spine, to include the craniocervical junction and cervicothoracic junction, were obtained without intravenous contrast. COMPARISON:  None available. FINDINGS: MRI HEAD FINDINGS Brain: Cerebral volume within normal limits for age. Mild scattered nonspecific cerebral white matter changes present within the periventricular, deep, and subcortical white matter of both cerebral hemispheres. There is a 710 mm focus of restricted diffusion involving the right basal ganglia, with involvement of the anterior right lentiform nucleus and caudate (series 5001, image 64). Additional  12 mm focus of restricted diffusion extends from the posterior right lentiform nucleus towards the posterior right corona radiata (series 5001, image 65). Findings most likely reflect sequelae of acute vascular ischemia, although active demyelination could conceivably be considered as well. No other evidence for acute or subacute ischemia. Gray-white matter differentiation maintained. No other areas of  chronic infarction. No acute or chronic intracranial hemorrhage. No mass lesion, midline shift or mass effect. No hydrocephalus. No extra-axial fluid collection. Major dural sinuses grossly patent. Pituitary gland suprasellar region normal. Midline structures intact and normal. Vascular: Major intracranial vascular flow voids are maintained. Skull and upper cervical spine: Craniocervical junction normal. Bone marrow signal intensity within normal limits. No scalp soft tissue abnormality. Sinuses/Orbits: Globes and orbital soft tissues within normal limits. Paranasal sinuses are clear. No mastoid effusion. Inner ear structures normal. Other: None. MRI CERVICAL SPINE FINDINGS Alignment: Straightening of the normal cervical lordosis. No listhesis. Vertebrae: Vertebral body heights maintained without evidence for acute or chronic fracture. Bone marrow signal intensity within normal limits. No discrete or worrisome osseous lesions. No abnormal marrow edema. Cord: Signal intensity within the cervical spinal cord is normal. No cord signal abnormality to suggest demyelinating disease. Cord caliber within normal limits without cord expansion or edema. Posterior Fossa, vertebral arteries, paraspinal tissues: Paraspinous and prevertebral soft tissues within normal limits. Normal intravascular flow voids present within the vertebral arteries bilaterally. Disc levels: C2-C3: Minimal disc bulge with uncovertebral hypertrophy. No significant canal stenosis. Mild left with moderate right C3 foraminal narrowing. C3-C4: Diffuse disc bulge with bilateral uncovertebral hypertrophy. No canal stenosis. Moderate bilateral C4 foraminal narrowing, greater on the right. C4-C5: Diffuse disc bulge with bilateral uncovertebral hypertrophy. No significant canal stenosis. Mild-to-moderate bilateral C5 foraminal stenosis. C5-C6: Mild diffuse disc bulge. Right greater than left facet hypertrophy. No canal stenosis. Mild right C6 foraminal  narrowing. No significant left foraminal encroachment. C6-C7: Broad central disc protrusion indents the ventral thecal sac, abutting the ventral spinal cord. Mild spinal stenosis. Foramina remain patent. C7-T1: Minimal disc bulge. Right-sided facet hypertrophy. Resultant mild to moderate right C8 foraminal stenosis. No significant left foraminal encroachment. No canal stenosis. MRI THORACIC SPINE FINDINGS Alignment: Normal alignment with preservation of the normal thoracic kyphosis. No listhesis. Vertebrae: Vertebral body heights maintained without evidence for acute or chronic fracture. Bone marrow signal intensity within normal limits. Mild reactive endplate changes present about the T7-8, T10-11, and T11-12 interspaces. No discrete or worrisome osseous lesions. No abnormal marrow edema. Cord: Signal intensity within the thoracic spinal cord is normal. Normal cord caliber. No findings to suggest demyelinating disease. No abnormal cord expansion or edema. Paraspinous soft tissues: Paraspinous soft tissues within normal limits. Visualized lungs are clear. Visualized visceral structures unremarkable. Disc levels: T1-2:  . unremarkable. T2-3: Unremarkable. T3-4:  Unremarkable. T4-5:  Unremarkable. T5-6: Diffuse disc bulge, asymmetric to the right. Effacement of the ventral CSF with mild stenosis. Foramina remain patent. T6-7: Diffuse disc bulge with superimposed broad left paracentral disc protrusion. Mild spinal stenosis with mild flattening of the left hemi cord. Foramina remain patent. T7-8: Diffuse disc bulge. Mild bilateral facet hypertrophy. Resultant mild spinal stenosis. Mild right foraminal narrowing. T8-9: Diffuse disc bulge. Mild facet hypertrophy. Mild spinal stenosis. T9-10: Mild diffuse disc bulge. Mild facet hypertrophy. Mild spinal stenosis. Mild left with moderate right foraminal narrowing. T10-11: Mild disc bulge. Right-sided facet hypertrophy. No significant canal stenosis. Mild right foraminal  narrowing. T11-12: Mild disc bulge. Bilateral facet hypertrophy, worse on the right. No canal stenosis. Mild bilateral foraminal  narrowing, worse on the right. T12-L1:  Unremarkable. IMPRESSION: MRI HEAD IMPRESSION 1. Two separate areas of restricted diffusion involving the right basal ganglia/corona radiata as above, favored to reflect acute ischemic vascular infarcts. Demyelinating disease could conceivably have this appearance as well, and could be considered in the correct clinical setting. 2. Additional mild nonspecific cerebral white matter changes. MRI CERVICAL SPINE IMPRESSION 1. No acute abnormality within the cervical spine. Normal MRI appearance of the cervical spinal cord without evidence for demyelinating disease. 2. Broad central disc protrusion at C6-7 with resultant mild spinal stenosis. 3. Multifactorial degenerative changes with resultant mild to moderate multilevel foraminal narrowing as above. MRI THORACIC SPINE IMPRESSION 1. No acute abnormality within the thoracic spine. Normal MRI appearance of the thoracic spinal cord without evidence for demyelinating disease. 2. Multilevel disc bulging/disc protrusions at T5-6 through T11-12 with resultant mild multilevel spinal and foraminal stenosis as above. Electronically Signed   By: Jeannine Boga M.D.   On: 01/09/2018 22:27   Dg Knee Ap/lat W/sunrise Left  Result Date: 12/31/2017 CLINICAL DATA:  Knee pain for 1 month, no known injury, initial encounter EXAM: LEFT KNEE 3 VIEWS COMPARISON:  05/26/2012 FINDINGS: Tricompartmental degenerative changes are noted. These have increased slightly in the interval from the prior exam. No joint effusion is seen. No acute fracture is noted. IMPRESSION: Slight progression of degenerative changes as described. Electronically Signed   By: Inez Catalina M.D.   On: 12/31/2017 15:57   Dg Knee Ap/lat W/sunrise Right  Result Date: 12/31/2017 CLINICAL DATA:  Bilateral knee pain for 1 month EXAM: RIGHT KNEE 3  VIEWS COMPARISON:  05/26/2012 FINDINGS: Tricompartmental degenerative changes are noted which have progressed in the interval from the prior exam. No acute fracture or dislocation is noted. Small joint effusion is noted. No soft tissue abnormality is seen. IMPRESSION: Tricompartmental degenerative change progressed in the interval from the prior exam. Small joint effusion is noted. Electronically Signed   By: Inez Catalina M.D.   On: 12/31/2017 15:56   TTE penidng   PHYSICAL EXAM  Temp:  [97.4 F (36.3 C)-98.5 F (36.9 C)] 98.4 F (36.9 C) (05/05 0423) Pulse Rate:  [72-86] 86 (05/05 0738) Resp:  [14-15] 15 (05/05 0423) BP: (153-168)/(96-108) 160/101 (05/05 0738) SpO2:  [93 %-100 %] 100 % (05/05 0738)  General - Well nourished, well developed, in no apparent distress.  Ophthalmologic - fundi not visualized due to noncooperation.  Cardiovascular - Regular rate and rhythm  Mental Status -  Level of arousal and orientation to time, place, and person were intact. Language including expression, naming, repetition, comprehension was assessed and found intact. Fund of Knowledge was assessed and was intact.  Cranial Nerves II - XII - II - Visual field intact OU. III, IV, VI - Extraocular movements intact. V - Facial sensation intact bilaterally. VII - Facial movement intact bilaterally. VIII - Hearing & vestibular intact bilaterally. X - Palate elevates symmetrically. XI - Chin turning & shoulder shrug intact bilaterally. XII - Tongue protrusion intact.  Motor Strength - The patient's strength was normal in all extremities and pronator drift was absent.  Bulk was normal and fasciculations were absent.   Motor Tone - Muscle tone was assessed at the neck and appendages and was normal.  Reflexes - The patient's reflexes were symmetrical in all extremities and she had no pathological reflexes.  Sensory - Light touch, temperature/pinprick were assessed and were symmetrical.     Coordination - The patient had normal movements in the hands  and feet with no ataxia or dysmetria.  Tremor was absent.  Gait and Station - walked in room, slowly but no imbalance.   ASSESSMENT/PLAN [name] is a 57 y.o. female with history of DM and HTN admitted for imbalanced gait. No tPA given due to OSW.    Stroke:  right BG/CR infarct likely secondary to small vessel disease source given risk factors and location  Resultant imbalance gait resolved  MRI  Right BG/CR infarcts  MRI C/T spine unremarkable, no MS plaques  CTA head and neck - left M1 and A1 moderate stenosis  2D Echo  pending  LDL 130  HgbA1c 8.0  lovenos for VTE prophylaxis  No antithrombotic prior to admission, now on aspirin 81 mg daily and clopidogrel 75 mg daily. Continue DAPT for 3 weeks and then ASA alone.  Patient counseled to be compliant with her antithrombotic medications  Ongoing aggressive stroke risk factor management  Therapy recommendations:  outpt PT  Disposition:  pending  Diabetes  HgbA1c 8.0 goal < 7.0  Uncontrolled  CBG monitoring  SSI  DM education and close PCP follow up  Hypertension Unstable, on the high end Put on HCTZ and amlodipine  Long term BP goal normotensive  Hyperlipidemia  Home meds:  none   LDL 130, goal < 70  Now on lipitor 40  Continue statin at discharge  Other Stroke Risk Factors  Obesity   Other Active Problems  B/l knee pain  Hospital day # 0  Neurology will sign off. Please call with questions. Pt will follow up with stroke clinic NP at [facility] in about 4 weeks. Thanks for the consult.   Rosalin Hawking, MD PhD Stroke Neurology 01/11/2018 11:13 AM    To contact Stroke Continuity provider, please refer to [email or web address] After hours, contact General Neurology

## 2018-01-11 NOTE — Progress Notes (Signed)
Physical Therapy Treatment Patient Details Name: Lindsay Travis MRN: [id number] DOB: 10-22-1960 Today's Date: 01/11/2018    History of Present Illness Pt is a 57 y/o female admitted secondary to bilateral LE weakness of unknown etiology. Work-up revealed  right BG/CR infarct. PMH including but not limited to DM and HTN.    PT Comments    Continuing work on functional mobility and activity tolerance;  Able to walk more this session, noted slight L foot clearance issues in swing with fatigue; worked on IT trainer as well; Overall moving well, continue to agree with Outpt PT for gait, balance, endurance   Follow Up Recommendations  Outpatient PT     Equipment Recommendations  None recommended by PT    Recommendations for Other Services       Precautions / Restrictions Precautions Precautions: None    Mobility  Bed Mobility Overal bed mobility: Modified Independent                Transfers Overall transfer level: Independent   Transfers: Sit to/from Stand Sit to Stand: Independent         General transfer comment: No difficulty with sit<>stand  Ambulation/Gait Ambulation/Gait assistance: Supervision Ambulation Distance (Feet): 550 Feet Assistive device: None Gait Pattern/deviations: Step-through pattern;Decreased stride length;Drifts right/left Gait velocity: decreased   General Gait Details: mild instability with drifting R/L, minor LOB but able to self-correct; min guard for safetyNoted with fatigue a slight tendency for LLE to contact the floor in swing   Stairs Stairs: Yes Stairs assistance: Min guard Stair Management: One rail Right;Step to pattern;Sideways;Forwards Number of Stairs: 10 General stair comments: Pt initially reported pain in R knee, which makes going up her stairs difficult, so practed going up and down stairs with a step-to pattern to give her an option for going up and down stairs with less R knee pain   Wheelchair Mobility     Modified Rankin (Stroke Patients Only) Modified Rankin (Stroke Patients Only) Pre-Morbid Rankin Score: No symptoms Modified Rankin: No significant disability     Balance     Sitting balance-Leahy Scale: Normal       Standing balance-Leahy Scale: Fair(approaching good)                              Cognition Arousal/Alertness: Awake/alert Behavior During Therapy: WFL for tasks assessed/performed Overall Cognitive Status: Within Functional Limits for tasks assessed                                        Exercises      General Comments        Pertinent Vitals/Pain Pain Assessment: No/denies pain    Home Living                      Prior Function            PT Goals (current goals can now be found in the care plan section) Acute Rehab PT Goals Patient Stated Goal: return to feeling "normal" PT Goal Formulation: With patient Time For Goal Achievement: 01/23/18 Potential to Achieve Goals: Good Progress towards PT goals: Progressing toward goals(likely one more session to meet goals)    Frequency    Min 3X/week      PT Plan Current plan remains appropriate    Co-evaluation  AM-PAC PT "6 Clicks" Daily Activity  Outcome Measure  Difficulty turning over in bed (including adjusting bedclothes, sheets and blankets)?: None Difficulty moving from lying on back to sitting on the side of the bed? : None Difficulty sitting down on and standing up from a chair with arms (e.g., wheelchair, bedside commode, etc,.)?: None Help needed moving to and from a bed to chair (including a wheelchair)?: None Help needed walking in hospital room?: None Help needed climbing 3-5 steps with a railing? : A Little 6 Click Score: 23    End of Session Equipment Utilized During Treatment: Gait belt Activity Tolerance: Patient tolerated treatment well Patient left: in chair;with call bell/phone within reach Nurse  Communication: Mobility status;Other (comment)(pt is safe to walk hallways) PT Visit Diagnosis: Other abnormalities of gait and mobility (R26.89)     Time: 2876-8115 PT Time Calculation (min) (ACUTE ONLY): 23 min  Charges:  $Gait Training: 23-37 mins                    G Codes:       Roney Marion, PT  Acute Rehabilitation Services Pager [phone] Office [place] 01/11/2018, 3:26 PM

## 2018-01-11 NOTE — Discharge Instructions (Signed)
You were admitted to the hospital for gait instability and found to have a stroke in your brain.  You have been monitored by neurology and they think you are safe to go home at this time.  Physical therapy will see you at home.  We have started you on a medication to thin your blood as well as reduce your cholesterol.  Please see the medication list for the names of these medicines.  Your blood pressure is still slightly uncontrolled, you will need to follow-up with your primary care doctor to see if they need to adjust those medications. Please come back to the hospital if you develop a new headache, weakness, is numbness, trouble walking It was a pleasure caring for you, take care!

## 2018-01-11 NOTE — Progress Notes (Addendum)
Family Medicine Teaching Service Daily Progress Note Intern Pager: [phone]  Patient name: Lindsay Travis Medical record number: [id number] Date of birth: 1961/03/27 Age: 57 y.o. Gender: female  Primary Care Provider: Nolene Ebbs, MD Consultants: neuro Code Status: full  Pt Overview and Major Events to Date:  5/3 admitted with unsteady gait 5/4 MRI with acute infarct  Assessment and Plan: Lindsay Travis is a 57 y.o. female presenting with subjective bilateral lower extremity weakness for 3 days. PMH is significant for hypertension and diabetes.  Gait abnormality secondary to CVA: Improved on exam today, patient reports subjective improvement as well.  Neurology is following, appreciate recs.  - neuro following, anticipate need for additional vascular imaging - lipitor 40mg  - ASA - echo today - PT/OT consult: outpatient PT  HTN: Patient remains hypertensive throughout admission, no longer treating as permissive given stroke >3 days ago. Patient is on home HCTZ, started amlodipine yesterday.  Discussed with patient the importance of blood pressure control in stroke risk management, patient understands she will need outpatient follow up. - continue home HCTZ 25 mg  - Amlodipine 10mg  qhs - Prn hydralazine for SBP>180, DBP>100  T2DM: Last A1c 8.6 12/19/17. Discussed with patient the importance of blood sugar control in stroke risk management, patient understands she will need outpatient follow up. Will plan to restart metformin on discharge. - qACHS CBGs - sensitive SSI  Knee Pain: Noted patellofemoral syndrome on XR, will require outpatient PT.   FEN/GI: carb-modified/heart healthy Prophylaxis: lovenox  Disposition: discharge after echocardiogram if cleared by neurology  Subjective:  Patient reports she is feeling more steady today.  Has some concern over a nodule near her ankle that when pushed, causes some pain that radiates up her leg (see PE below).    Objective: Temp:  [97.4 F (36.3 C)-98.5 F (36.9 C)] 98.4 F (36.9 C) (05/05 0423) Pulse Rate:  [72-86] 86 (05/05 0738) Resp:  [14-15] 15 (05/05 0423) BP: (153-168)/(96-108) 160/101 (05/05 0738) SpO2:  [93 %-100 %] 100 % (05/05 0738) Physical Exam: General: Well appearing, sitting on EOB in NAD Cardiovascular: RRR, nl S1, S2, no murmurs, rubs or gallops Respiratory: CTAB, nl work of breathing Abdomen: non tender to palpation, BS+ Extremities: no edema. 2cm firm, mobile subcutaneous nodule 2" superior anterior to lateral malleolus.  Neuro: Alert and oriented x 3, CN 2-12 grossly intact. Strength 5/5 in all extremities. Sensation intact in bilateral upper and lower extremities. Gait appears normal, no balance deficits noted.   Laboratory: Recent Labs  Lab 01/09/18 1226 01/10/18 0447 01/11/18 0405  WBC 4.0 4.8 4.8  HGB 13.2 12.9 14.1  HCT 39.6 39.3 42.8  PLT 226 209 231   Recent Labs  Lab 01/09/18 1226 01/10/18 0447 01/11/18 0405  NA 140 139 137  K 3.7 3.7 3.9  CL 104 99* 98*  CO2 27 30 29   BUN 13 14 15   CREATININE 0.66 0.89 0.88  CALCIUM 9.4 9.5 9.6  PROT 7.5  --   --   BILITOT 0.6  --   --   ALKPHOS 61  --   --   ALT 23  --   --   AST 17  --   --   GLUCOSE 159* 169* 161*   See previous progress note on 5/4 for detailed MR/CT/XR imaging results.   Lindsay Travis, Medical Student 01/11/2018, 10:50 AM MS4, [facility] Intern pager: [phone], text pages welcome  RESIDENT ADDENDUM  I saw and evaluated the patient, performing the key  elements of service. I developed the management plan that is described in the Medical Student's note, and I agree with the content, making changing as needed. My detailed findings are below.  Physical Exam:  BP (!) 160/101 (BP Location: Left Arm)   Pulse 86   Temp 98.4 F (36.9 C) (Oral)   Resp 15   LMP 09/10/2011   SpO2 100%   General Appearance:   In NAD, pleasant   HENT: Normocephalic, no obvious  abnormality, PERRL, EOM's intact, conjunctiva and cornea normal, external ear canals normal, both ears, nares patent and symmetric  Neck:   Normal range of motion without masses or tenderness  Lungs:   Clear to auscultation bilaterally, respirations unlabored, nor rales, rhonchi or wheezes  Heart:   Regular rate and rhythm, S1 and S2 normal, no murmurs, rubs, or gallops; Peripheral pulses present and normal throughout; Brisk capillary refill.  Abdomen:   Soft, non-tender, bowel sounds present, no mass, or organomegaly  Musculoskeletal:  Grossly normal age-appropriate movements, tone, and strength  Lymphatic:   No cervical adenopathy   Skin/Hair/Nails:   Skin warm, dry and intact, no bruises or petechiae  Neurologic:   Alert, no cranial nerve deficits, normal strength and tone, gait steady   Assessment and Plan:   1. CVA causing gait instability: Much improved. Neuro has signed off.  -DAPT x 3 weeks and then just ASA -Will have outpatient PT -Lipitor 40 mg daily.  -Echo -Can likely go home after echo  2. HTN: uncontrolled - Amlodipine 10 mg daily - HCTZ 25 mg daily - Will need close outpatient follow up  3. DM: A1C 8. Has required 6 units of SSI over last 24 hours.  - Can resume metformin on DC  Smitty Cords, MD 01/11/2018, 1:22 PM PGY-3, [place]

## 2018-01-11 NOTE — Discharge Summary (Addendum)
[facility] Discharge Summary  Patient name: Lindsay Travis Medical record number: [id number] Date of birth: [birth year] Age: 57 y.o. Gender: female Date of Admission: 01/09/2018  Date of Discharge: 01/11/2018 Admitting Physician: Dickie La, MD  Primary Care Provider: Nolene Ebbs, MD Consultants: Neurology  Indication for Hospitalization: Bilateral lower extremity weakness  Discharge Diagnoses/Problem List:  Acute Stroke  DM HTN Hyperlipidemia Obesity Chronic knee pain  Disposition: home  Discharge Condition: stable  Discharge Exam:  General: well appearing, alert and oriented Cardiac: RRR, nl S1/S2, no murmurs, rubs or gallops Lungs: CTAB, nl work of breathing Skin: no rashes Extremities: subcutaneous nodule 2cm in diameter 2" above right lateral malleolus that is firm, mobile Neuro: CN: grossly intact Strength 5/5 in upper and lower extremities bilaterally Sensation intact in upper and lower extremities bilaterally No gait abnormalities    Brief Hospital Course:  Lindsay Travis was admitted from sports medicine clinic with a 2-3 day history of subjective bilateral lower extremity weakness and noted gait change.  On admission, initial imaging was suggestive of patellofemoral syndrome.  Brain imaging positive for stroke in right basal ganglia/coronal radiata likely secondary to small vessel disease per neurology. Stroke risk stratification notable for elevated hgbA1c, LDL, and blood pressure.  Patient was started on aspirin and clopidogrel during hospitalization and is to continue DAPT for 3 weeks, then continue aspirin alone. Patient was also started on statin and amlodipine.  Given elevated blood glucoses and HgbA1c during admission, patient was restarted on prior home metformin.  Patient reported subjectively improved leg weakness and balance at the time of discharge.  Patient was counseled on importance of compliance with DAPT therapy prior to  discharge.   Issues for Follow Up:  1. PT for patellofemoral syndrome outpatient 2. Follow-up hypertension given new medications 3. LFTs in 2-3 weeks per initiation of statin 4. DAPT for 3 weeks, then discontinue clopidogrel 5. F/u diabetes management with restarted metformin 6. Neurology outpatient follow-up   Significant Procedures: none  Significant Labs and Imaging:  Recent Labs  Lab 01/09/18 1226 01/10/18 0447 01/11/18 0405  WBC 4.0 4.8 4.8  HGB 13.2 12.9 14.1  HCT 39.6 39.3 42.8  PLT 226 209 231   Recent Labs  Lab 01/09/18 1226 01/10/18 0447 01/11/18 0405  NA 140 139 137  K 3.7 3.7 3.9  CL 104 99* 98*  CO2 27 30 29   GLUCOSE 159* 169* 161*  BUN 13 14 15   CREATININE 0.66 0.89 0.88  CALCIUM 9.4 9.5 9.6  ALKPHOS 61  --   --   AST 17  --   --   ALT 23  --   --   ALBUMIN 3.6  --   --    CT HEAD FINDINGS  Brain: White matter infarcts involving the right internal capsule and centrum semi ovale are again noted. There is no interval hemorrhage. No new infarcts are present. Basal ganglia are intact. Insular ribbon is normal bilaterally. Ventricles are of normal size. No significant extra-axial fluid collection is present. Brainstem and cerebellum are normal.  Vascular: No hyperdense vessel or unexpected calcification.  Skull: Calvarium is intact. No acute or healing fractures are present. No focal lytic or blastic lesions are evident.  Sinuses: The paranasal sinuses and mastoid air cells are clear.  Orbits: Globes and orbits are within normal limits.  Review of the MIP images confirms the above findings  CTA NECK FINDINGS  Aortic arch: A 3 vessel arch configuration is present. There is no significant  atherosclerotic disease or stenosis at the aortic arch.  Right carotid system: The right common carotid artery is within normal limits. Noncalcified plaque is present bifurcation without a significant stenosis. There is tortuosity of the cervical  right ICA without significant stenosis.  Left carotid system: The left common carotid artery is within normal limits. Bifurcation is unremarkable. The cervical left ICA is tortuous without significant stenosis.  Vertebral arteries: Vertebral arteries both originate from the subclavian arteries without significant stenosis. There is proximal tortuosity bilaterally. No focal stenosis is present in either vertebral artery in the neck. Vertebral arteries are codominant.  Skeleton: Vertebral body heights and alignment are maintained. No focal lytic or blastic lesions are present.  Other neck: The soft tissues the neck are otherwise unremarkable. No focal mucosal or submucosal lesions are present. Salivary glands are within normal limits. Thyroid is unremarkable. No significant adenopathy is present.  Upper chest: Mild dependent atelectasis is present. Lung apices are otherwise clear. The thoracic inlet is within normal limits.  Review of the MIP images confirms the above findings  CTA HEAD FINDINGS  Anterior circulation: Internal carotid arteries are within normal limits from the skull base through the ICA termini bilaterally. The right A1 and M1 segments are normal. The anterior communicating artery is patent. There is a moderate proximal left M1 segment stenosis. There is a moderate distal left A1 segment stenosis. MCA bifurcations are intact. ACA and MCA branch vessels are unremarkable.  Posterior circulation: The vertebral arteries are codominant. PICA origins are visualized and normal. The right posterior cerebral artery originates from basilar tip. The left posterior cerebral artery is of fetal type. The PCA branch vessels are normal bilaterally.  Venous sinuses: Dural sinuses are patent bilaterally. Straight sinus and deep cerebral veins are intact. Cortical veins are unremarkable.  Anatomic variants: Fetal type left posterior cerebral artery.  Delayed  phase: The postcontrast images demonstrate no pathologic enhancement.  Review of the MIP images confirms the above findings  IMPRESSION: 1. Expected evolution of nonhemorrhagic infarcts involving the anterior limb of the right internal capsule in the posterior centrum semi ovale. 2. Moderate stenosis of the proximal left M1 segment. Distal MCA branch vessels are intact. 3. Moderate stenosis of the distal left A1 segment. ACA branch vessels are normal bilaterally with a patent anterior communicating artery. 4. Atherosclerotic changes at the carotid bifurcations bilaterally without significant stenosis. 5. Tortuosity of the cervical internal carotid arteries without significant stenosis. This is nonspecific, but most commonly seen in the setting of hypertension.   MRI HEAD FINDINGS  Brain: Cerebral volume within normal limits for age. Mild scattered nonspecific cerebral white matter changes present within the periventricular, deep, and subcortical white matter of both cerebral hemispheres. There is a 710 mm focus of restricted diffusion involving the right basal ganglia, with involvement of the anterior right lentiform nucleus and caudate (series 5001, image 64). Additional 12 mm focus of restricted diffusion extends from the posterior right lentiform nucleus towards the posterior right corona radiata (series 5001, image 65). Findings most likely reflect sequelae of acute vascular ischemia, although active demyelination could conceivably be considered as well.  No other evidence for acute or subacute ischemia. Gray-white matter differentiation maintained. No other areas of chronic infarction. No acute or chronic intracranial hemorrhage.  No mass lesion, midline shift or mass effect. No hydrocephalus. No extra-axial fluid collection. Major dural sinuses grossly patent.  Pituitary gland suprasellar region normal. Midline structures intact and normal.  Vascular: Major  intracranial vascular flow voids are maintained.  Skull and upper cervical spine: Craniocervical junction normal. Bone marrow signal intensity within normal limits. No scalp soft tissue abnormality.  Sinuses/Orbits: Globes and orbital soft tissues within normal limits. Paranasal sinuses are clear. No mastoid effusion. Inner ear structures normal.  Other: None.  MRI CERVICAL SPINE FINDINGS  Alignment: Straightening of the normal cervical lordosis. No listhesis.  Vertebrae: Vertebral body heights maintained without evidence for acute or chronic fracture. Bone marrow signal intensity within normal limits. No discrete or worrisome osseous lesions. No abnormal marrow edema.  Cord: Signal intensity within the cervical spinal cord is normal. No cord signal abnormality to suggest demyelinating disease. Cord caliber within normal limits without cord expansion or edema.  Posterior Fossa, vertebral arteries, paraspinal tissues: Paraspinous and prevertebral soft tissues within normal limits. Normal intravascular flow voids present within the vertebral arteries bilaterally.  Disc levels:  C2-C3: Minimal disc bulge with uncovertebral hypertrophy. No significant canal stenosis. Mild left with moderate right C3 foraminal narrowing.  C3-C4: Diffuse disc bulge with bilateral uncovertebral hypertrophy. No canal stenosis. Moderate bilateral C4 foraminal narrowing, greater on the right.  C4-C5: Diffuse disc bulge with bilateral uncovertebral hypertrophy. No significant canal stenosis. Mild-to-moderate bilateral C5 foraminal stenosis.  C5-C6: Mild diffuse disc bulge. Right greater than left facet hypertrophy. No canal stenosis. Mild right C6 foraminal narrowing. No significant left foraminal encroachment.  C6-C7: Broad central disc protrusion indents the ventral thecal sac, abutting the ventral spinal cord. Mild spinal stenosis. Foramina remain patent.  C7-T1: Minimal  disc bulge. Right-sided facet hypertrophy. Resultant mild to moderate right C8 foraminal stenosis. No significant left foraminal encroachment. No canal stenosis.  MRI THORACIC SPINE FINDINGS  Alignment: Normal alignment with preservation of the normal thoracic kyphosis. No listhesis.  Vertebrae: Vertebral body heights maintained without evidence for acute or chronic fracture. Bone marrow signal intensity within normal limits. Mild reactive endplate changes present about the T7-8, T10-11, and T11-12 interspaces. No discrete or worrisome osseous lesions. No abnormal marrow edema.  Cord: Signal intensity within the thoracic spinal cord is normal. Normal cord caliber. No findings to suggest demyelinating disease. No abnormal cord expansion or edema.  Paraspinous soft tissues: Paraspinous soft tissues within normal limits. Visualized lungs are clear. Visualized visceral structures unremarkable.  Disc levels:  T1-2:  . unremarkable.  T2-3: Unremarkable.  T3-4:  Unremarkable.  T4-5:  Unremarkable.  T5-6: Diffuse disc bulge, asymmetric to the right. Effacement of the ventral CSF with mild stenosis. Foramina remain patent.  T6-7: Diffuse disc bulge with superimposed broad left paracentral disc protrusion. Mild spinal stenosis with mild flattening of the left hemi cord. Foramina remain patent.  T7-8: Diffuse disc bulge. Mild bilateral facet hypertrophy. Resultant mild spinal stenosis. Mild right foraminal narrowing.  T8-9: Diffuse disc bulge. Mild facet hypertrophy. Mild spinal stenosis.  T9-10: Mild diffuse disc bulge. Mild facet hypertrophy. Mild spinal stenosis. Mild left with moderate right foraminal narrowing.  T10-11: Mild disc bulge. Right-sided facet hypertrophy. No significant canal stenosis. Mild right foraminal narrowing.  T11-12: Mild disc bulge. Bilateral facet hypertrophy, worse on the right. No canal stenosis. Mild bilateral foraminal  narrowing, worse on the right.  T12-L1:  Unremarkable.  IMPRESSION: MRI HEAD IMPRESSION  1. Two separate areas of restricted diffusion involving the right basal ganglia/corona radiata as above, favored to reflect acute ischemic vascular infarcts. Demyelinating disease could conceivably have this appearance as well, and could be considered in the correct clinical setting. 2. Additional mild nonspecific cerebral white matter changes.  MRI CERVICAL SPINE IMPRESSION  1. No acute  abnormality within the cervical spine. Normal MRI appearance of the cervical spinal cord without evidence for demyelinating disease. 2. Broad central disc protrusion at C6-7 with resultant mild spinal stenosis. 3. Multifactorial degenerative changes with resultant mild to moderate multilevel foraminal narrowing as above.  MRI THORACIC SPINE IMPRESSION  1. No acute abnormality within the thoracic spine. Normal MRI appearance of the thoracic spinal cord without evidence for demyelinating disease. 2. Multilevel disc bulging/disc protrusions at T5-6 through T11-12 with resultant mild multilevel spinal and foraminal stenosis as Above.  LEFT KNEE 3 VIEWS FINDINGS: Tricompartmental degenerative changes are noted. These have increased slightly in the interval from the prior exam. No joint effusion is seen. No acute fracture is noted.  IMPRESSION: Slight progression of degenerative changes as described.  RIGHT KNEE 3 VIEWS FINDINGS: Tricompartmental degenerative changes are noted which have progressed in the interval from the prior exam. No acute fracture or dislocation is noted. Small joint effusion is noted. No soft tissue abnormality is seen.  IMPRESSION: Tricompartmental degenerative change progressed in the interval from the prior exam. Small joint effusion is noted.  Results/Tests Pending at Time of Discharge: none  Discharge Medications:   Allergies as of 01/11/2018      Reactions    Lisinopril Other (See Comments)   Did not feel good   Losartan Other (See Comments)   headache   Aspirin Other (See Comments)   Abdominal pain      Medication List    STOP taking these medications   calcium gluconate 500 MG tablet   magnesium oxide 400 MG tablet Commonly known as:  MAG-OX     TAKE these medications   amLODipine 10 MG tablet Commonly known as:  NORVASC Take 1 tablet (10 mg total) by mouth daily.   aspirin 81 MG EC tablet Take 1 tablet (81 mg total) by mouth daily.   atorvastatin 40 MG tablet Commonly known as:  LIPITOR Take 1 tablet (40 mg total) by mouth daily at 6 PM.   clopidogrel 75 MG tablet Commonly known as:  PLAVIX Take 1 tablet (75 mg total) by mouth daily for 21 days.   hydrochlorothiazide 25 MG tablet Commonly known as:  HYDRODIURIL Take 1 tablet (25 mg total) by mouth daily.         Discharge Instructions: Please refer to Patient Instructions section of EMR for full details.  Patient was counseled important signs and symptoms that should prompt return to medical care, changes in medications, dietary instructions, activity restrictions, and follow up appointments.   Follow-Up Appointments:  [facility] [address] [place] [phone] Schedule an appointment as soon as possible for a visit in 4 week(s)   Nolene Ebbs, MD [place] [phone]  Schedule an appointment as soon as possible for a visit on 01/15/2018   [facility] [phone] Roland Brantley [phone] Follow up They should you in the next 5 days to schedule out patient PT. If you do not receive a call, plaese call them. A referral has been placed through [employer], Sallyanne Havers, Medical Student 01/11/2018, 9:10 AM MS4, [place] Upper-Level Resident  Addendum  I have independently interviewed and examined the patient. I have discussed the above with the original author and agree with their documentation.Please see also any attending notes.   Lucila Maine, DO PGY-2, [place] [facility]  pager: [phone] (text pages welcome through [facility])

## 2018-01-11 NOTE — Care Management Note (Signed)
Case Management Note  Patient Details  Name: Lindsay Travis MRN: [id number] Date of Birth: [birth year]  Subjective/Objective:                 Referral for outpatient PT placed through Epic. No other CM needs   Action/Plan:   Expected Discharge Date:  01/11/18               Expected Discharge Plan:  Home/Self Care  In-House Referral:     Discharge planning Services  CM Consult  Post Acute Care Choice:    Choice offered to:     DME Arranged:    DME Agency:     HH Arranged:    HH Agency:     Status of Service:  Completed, signed off  If discussed at [employer] of Stay Meetings, dates discussed:    Additional Comments:  Carles Collet, RN 01/11/2018, 4:24 PM

## 2018-01-11 NOTE — Progress Notes (Addendum)
0730 Pt is awake, c/o headache with mild nausea since last night. BP is high. Tylenol and BP meds given this morning, Dr Grandville Silos of [facility] medicine notified. 10 Pt is feeling a lot better.  1930 Discharge instructions given to pt. Discharged to home accompanied by family.

## 2018-01-14 ENCOUNTER — Ambulatory Visit: Payer: BLUE CROSS/BLUE SHIELD | Admitting: Physical Therapy

## 2018-01-19 ENCOUNTER — Ambulatory Visit: Payer: BLUE CROSS/BLUE SHIELD | Admitting: Physical Therapy

## 2018-01-20 ENCOUNTER — Ambulatory Visit: Payer: BLUE CROSS/BLUE SHIELD | Admitting: Sports Medicine

## 2018-01-20 ENCOUNTER — Other Ambulatory Visit: Payer: Self-pay

## 2018-01-20 ENCOUNTER — Encounter: Payer: Self-pay | Admitting: Urgent Care

## 2018-01-20 ENCOUNTER — Ambulatory Visit: Payer: BLUE CROSS/BLUE SHIELD | Admitting: Urgent Care

## 2018-01-20 VITALS — BP 134/82 | HR 71 | Temp 98.3°F | Ht 65.0 in | Wt 235.2 lb

## 2018-01-20 DIAGNOSIS — I6330 Cerebral infarction due to thrombosis of unspecified cerebral artery: Secondary | ICD-10-CM

## 2018-01-20 DIAGNOSIS — M25561 Pain in right knee: Secondary | ICD-10-CM

## 2018-01-20 DIAGNOSIS — M170 Bilateral primary osteoarthritis of knee: Secondary | ICD-10-CM | POA: Diagnosis not present

## 2018-01-20 DIAGNOSIS — M25562 Pain in left knee: Secondary | ICD-10-CM

## 2018-01-20 DIAGNOSIS — M791 Myalgia, unspecified site: Secondary | ICD-10-CM

## 2018-01-20 DIAGNOSIS — E785 Hyperlipidemia, unspecified: Secondary | ICD-10-CM | POA: Diagnosis not present

## 2018-01-20 DIAGNOSIS — E119 Type 2 diabetes mellitus without complications: Secondary | ICD-10-CM

## 2018-01-20 DIAGNOSIS — G8929 Other chronic pain: Secondary | ICD-10-CM

## 2018-01-20 DIAGNOSIS — I10 Essential (primary) hypertension: Secondary | ICD-10-CM | POA: Diagnosis not present

## 2018-01-20 MED ORDER — AMLODIPINE BESYLATE 10 MG PO TABS: 10.0000 mg | ORAL_TABLET | Freq: Every day | ORAL | 1 refills | Status: DC

## 2018-01-20 MED ORDER — GLIPIZIDE 5 MG PO TABS: 5.0000 mg | ORAL_TABLET | Freq: Two times a day (BID) | ORAL | 5 refills | Status: DC

## 2018-01-20 MED ORDER — ATORVASTATIN CALCIUM 40 MG PO TABS: 40.0000 mg | ORAL_TABLET | Freq: Every day | ORAL | 1 refills | Status: DC

## 2018-01-20 MED ORDER — HYDROCHLOROTHIAZIDE 25 MG PO TABS: 25.0000 mg | ORAL_TABLET | Freq: Every day | ORAL | 1 refills | Status: DC

## 2018-01-20 NOTE — Patient Instructions (Addendum)
You may take 500mg  Tylenol with every 6 hours for pain and inflammation.  Hydrate well with at least 2 liters (1 gallon) of water daily. Please call [facility] for a recheck on your knee arthritis.    For the first week take 1 tablet daily of glipizide. Then increase to 1 tablet twice daily.   Tomorrow, 01/21/2018, you have an appointment with neurology for physical therapy. You have an appointment with [facility] on 03/10/2018 with Dr. Leonie Travis.   Osteoarthritis Osteoarthritis is a type of arthritis that affects tissue that covers the ends of bones in joints (cartilage). Cartilage acts as a cushion between the bones and helps them move smoothly. Osteoarthritis results when cartilage in the joints gets worn down. Osteoarthritis is sometimes called "wear and tear" arthritis. Osteoarthritis is the most common form of arthritis. It often occurs in older people. It is a condition that gets worse over time (a progressive condition). Joints that are most often affected by this condition are in:  Fingers.  Toes.  Hips.  Knees.  Spine, including neck and lower back.  What are the causes? This condition is caused by age-related wearing down of cartilage that covers the ends of bones. What increases the risk? The following factors may make you more likely to develop this condition:  Older age.  Being overweight or obese.  Overuse of joints, such as in athletes.  Past injury of a joint.  Past surgery on a joint.  Family history of osteoarthritis.  What are the signs or symptoms? The main symptoms of this condition are pain, swelling, and stiffness in the joint. The joint may lose its shape over time. Small pieces of bone or cartilage may break off and float inside of the joint, which may cause more pain and damage to the joint. Small deposits of bone (osteophytes) may grow on the edges of the joint. Other symptoms may include:  A grating or scraping feeling inside  the joint when you move it.  Popping or creaking sounds when you move.  Symptoms may affect one or more joints. Osteoarthritis in a major joint, such as your knee or hip, can make it painful to walk or exercise. If you have osteoarthritis in your hands, you might not be able to grip items, twist your hand, or control small movements of your hands and fingers (fine motor skills). How is this diagnosed? This condition may be diagnosed based on:  Your medical history.  A physical exam.  Your symptoms.  X-rays of the affected joint(s).  Blood tests to rule out other types of arthritis.  How is this treated? There is no cure for this condition, but treatment can help to control pain and improve joint function. Treatment plans may include:  A prescribed exercise program that allows for rest and joint relief. You may work with a physical therapist.  A weight control plan.  Pain relief techniques, such as: ? Applying heat and cold to the joint. ? Electric pulses delivered to nerve endings under the skin (transcutaneous electrical nerve stimulation, or TENS). ? Massage. ? Certain nutritional supplements.  NSAIDs or prescription medicines to help relieve pain.  Medicine to help relieve pain and inflammation (corticosteroids). This can be given by mouth (orally) or as an injection.  Assistive devices, such as a brace, wrap, splint, specialized glove, or cane.  Surgery, such as: ? An osteotomy. This is done to reposition the bones and relieve pain or to remove loose pieces of bone and  cartilage. ? Joint replacement surgery. You may need this surgery if you have very bad (advanced) osteoarthritis.  Follow these instructions at home: Activity  Rest your affected joints as directed by your health care provider.  Do not drive or use heavy machinery while taking prescription pain medicine.  Exercise as directed. Your health care provider or physical therapist may recommend specific  types of exercise, such as: ? Strengthening exercises. These are done to strengthen the muscles that support joints that are affected by arthritis. They can be performed with weights or with exercise bands to add resistance. ? Aerobic activities. These are exercises, such as brisk walking or water aerobics, that get your heart pumping. ? Range-of-motion activities. These keep your joints easy to move. ? Balance and agility exercises. Managing pain, stiffness, and swelling  If directed, apply heat to the affected area as often as told by your health care provider. Use the heat source that your health care provider recommends, such as a moist heat pack or a heating pad. ? If you have a removable assistive device, remove it as told by your health care provider. ? Place a towel between your skin and the heat source. If your health care provider tells you to keep the assistive device on while you apply heat, place a towel between the assistive device and the heat source. ? Leave the heat on for 20-30 minutes. ? Remove the heat if your skin turns bright red. This is especially important if you are unable to feel pain, heat, or cold. You may have a greater risk of getting burned.  If directed, put ice on the affected joint: ? If you have a removable assistive device, remove it as told by your health care provider. ? Put ice in a plastic bag. ? Place a towel between your skin and the bag. If your health care provider tells you to keep the assistive device on during icing, place a towel between the assistive device and the bag. ? Leave the ice on for 20 minutes, 2-3 times a day. General instructions  Take over-the-counter and prescription medicines only as told by your health care provider.  Maintain a healthy weight. Follow instructions from your health care provider for weight control. These may include dietary restrictions.  Do not use any products that contain nicotine or tobacco, such as  cigarettes and e-cigarettes. These can delay bone healing. If you need help quitting, ask your health care provider.  Use assistive devices as directed by your health care provider.  Keep all follow-up visits as told by your health care provider. This is important. Where to find more information:  [employer] of Arthritis and Musculoskeletal and Skin Diseases: www.niams.SouthExposed.es  [employer] on Aging: http://kim-miller.com/  American College of Rheumatology: www.rheumatology.org Contact a health care provider if:  Your skin turns red.  You develop a rash.  You have pain that gets worse.  You have a fever along with joint or muscle aches. Get help right away if:  You lose a lot of weight.  You suddenly lose your appetite.  You have night sweats. Summary  Osteoarthritis is a type of arthritis that affects tissue covering the ends of bones in joints (cartilage).  This condition is caused by age-related wearing down of cartilage that covers the ends of bones.  The main symptom of this condition is pain, swelling, and stiffness in the joint.  There is no cure for this condition, but treatment can help to control pain and improve  joint function. This information is not intended to replace advice given to you by your health care provider. Make sure you discuss any questions you have with your health care provider. Document Released: 08/26/2005 Document Revised: 04/29/2016 Document Reviewed: 04/29/2016 Elsevier Interactive Patient Education  [employer].

## 2018-01-20 NOTE — Progress Notes (Signed)
MRN: [id number] DOB: 1960-11-02  Subjective:   Lindsay Travis is a 57 y.o. female presenting for recheck on bilateral knee pain.  Her last office visit on 12/19/2017 patient was seen for the same and referred to orthopedics.  She was seen at [facility], had repeat x-rays that showed tricompartmental osteoarthritis of both her knees.  She did receive a steroid injection to her knees.  Was advised that she is not a candidate for knee replacement at this time.  Today she reports that her knee pain is improved but is not stating that she feels like her knee gives out and buckles.  She has also had a little bit of thigh pain since being discharged from the hospital.  She was seen for cerebral infarct, confirmed on MRI and CT.  She has an office visit scheduled with [facility] neurology in July, is going to physical therapy currently.  She was restarted on blood pressure medicine, cholesterol medicine and is currently taking clopidogrel and aspirin as well.  She did not pick up metformin, states that she has had side effects from this before including dry mouth, bad taste.  She would like to try different diabetes medication. Denies smoking cigarettes or drinking alcohol.   [name] has a current medication list which includes the following prescription(s): amlodipine, aspirin, atorvastatin, clopidogrel, and hydrochlorothiazide. Also is allergic to lisinopril; losartan; and aspirin.  [name]  has a past medical history of Allergy, Anemia, Clotting disorder ([place]), Diabetes mellitus, Hypertension, and Shortness of breath. Also  has a past surgical history that includes No past surgeries and Abdominal hysterectomy (09/11/2011).  Objective:   Vitals: BP 134/82 (BP Location: Left Arm)   Pulse 71   Temp 98.3 F (36.8 C) (Oral)   Ht '5\' 5"'  (1.651 m)   Wt 235 lb 3.2 oz (106.7 kg)   LMP 09/10/2011   SpO2 98%   BMI 39.14 kg/m   BP Readings from Last 3 Encounters:  01/20/18 134/82  01/11/18  (!) 150/98  01/09/18 (!) 169/105   Physical Exam  Constitutional: She is oriented to person, place, and time. She appears well-developed and well-nourished.  HENT:  Mouth/Throat: Oropharynx is clear and moist.  Eyes: Pupils are equal, round, and reactive to light. EOM are normal.  Cardiovascular: Normal rate, regular rhythm and intact distal pulses. Exam reveals no gallop and no friction rub.  No murmur heard. Pulmonary/Chest: No respiratory distress. She has no wheezes. She has no rales.  Musculoskeletal:       Right knee: She exhibits normal range of motion, no swelling, no effusion, no ecchymosis, no deformity, no laceration, no erythema, normal alignment and normal patellar mobility. No tenderness found.       Left knee: She exhibits normal range of motion, no swelling, no effusion, no ecchymosis, no deformity, no laceration, no erythema, normal alignment and normal patellar mobility. No tenderness found.  Neurological: She is alert and oriented to person, place, and time. No cranial nerve deficit. Coordination normal.  Skin: Skin is warm and dry. Capillary refill takes less than 2 seconds.  Psychiatric: She has a normal mood and affect.   Assessment and Plan :   Chronic pain of both knees  Tricompartment osteoarthritis of both knees  Essential hypertension  Type 2 diabetes mellitus without complication, without long-term current use of insulin (HCC)  Morbid obesity (HCC)  Myalgia - Plan: Basic metabolic panel, Sedimentation Rate  Cerebral thrombosis with cerebral infarction  Hyperlipidemia, unspecified hyperlipidemia type  Counseled patient  on plan for follow-up with [facility] neurology.  For now she is to maintain her clopidogrel and aspirin.  I will recheck her creatinine, potassium and ESR levels today.  She may be having myalgia from restarting the statin or possibly from not hydrating well, restarting hydrochlorothiazide.  Labs pending.  She will follow-up with [facility], set up a office visit for recheck.  For her diabetes we will restart glipizide.  Follow-up in 2 weeks for management of her chronic medical conditions.  Jaynee Eagles, PA-C [facility] [phone] 01/20/2018  9:28 AM

## 2018-01-21 ENCOUNTER — Encounter: Payer: Self-pay | Admitting: Physical Therapy

## 2018-01-21 ENCOUNTER — Ambulatory Visit: Payer: BLUE CROSS/BLUE SHIELD | Attending: Family Medicine | Admitting: Physical Therapy

## 2018-01-21 ENCOUNTER — Other Ambulatory Visit: Payer: Self-pay

## 2018-01-21 DIAGNOSIS — M25561 Pain in right knee: Secondary | ICD-10-CM | POA: Insufficient documentation

## 2018-01-21 DIAGNOSIS — M6281 Muscle weakness (generalized): Secondary | ICD-10-CM

## 2018-01-21 DIAGNOSIS — M25562 Pain in left knee: Secondary | ICD-10-CM | POA: Diagnosis not present

## 2018-01-21 DIAGNOSIS — G8929 Other chronic pain: Secondary | ICD-10-CM

## 2018-01-21 DIAGNOSIS — R2689 Other abnormalities of gait and mobility: Secondary | ICD-10-CM

## 2018-01-21 DIAGNOSIS — M25661 Stiffness of right knee, not elsewhere classified: Secondary | ICD-10-CM

## 2018-01-21 DIAGNOSIS — M25662 Stiffness of left knee, not elsewhere classified: Secondary | ICD-10-CM | POA: Insufficient documentation

## 2018-01-21 DIAGNOSIS — R2681 Unsteadiness on feet: Secondary | ICD-10-CM | POA: Insufficient documentation

## 2018-01-21 LAB — BASIC METABOLIC PANEL
BUN/Creatinine Ratio: 24 — ABNORMAL HIGH (ref 9–23)
BUN: 19 mg/dL (ref 6–24)
CALCIUM: 9.8 mg/dL (ref 8.7–10.2)
CO2: 25 mmol/L (ref 20–29)
CREATININE: 0.79 mg/dL (ref 0.57–1.00)
Chloride: 100 mmol/L (ref 96–106)
GFR calc non Af Amer: 85 mL/min/{1.73_m2} (ref >59)
GFR, EST AFRICAN AMERICAN: 97 mL/min/{1.73_m2} (ref >59)
Glucose: 235 mg/dL — ABNORMAL HIGH (ref 65–99)
Potassium: 3.9 mmol/L (ref 3.5–5.2)
Sodium: 142 mmol/L (ref 134–144)

## 2018-01-21 LAB — SEDIMENTATION RATE: Sed Rate: 8 mm/hr (ref 0–40)

## 2018-01-22 NOTE — Therapy (Signed)
[facility] [address] [place], [place], [address] Phone: [phone]   Fax:  [phone]  Physical Therapy Evaluation  Patient Details  Name: Lindsay Travis MRN: [id number] Date of Birth: 23-May-1961 Referring Provider: Dorcas Mcmurray, MD   Encounter Date: 01/21/2018  PT End of Session - 01/21/18 1213    Visit Number  1    Number of Visits  17    Date for PT Re-Evaluation  04/02/18    Authorization Type  BCBS    PT Start Time  1100    PT Stop Time  1145    PT Time Calculation (min)  45 min    Equipment Utilized During Treatment  Gait belt    Activity Tolerance  Patient tolerated treatment well    Behavior During Therapy  [facility] for tasks assessed/performed       Past Medical History:  Diagnosis Date  . Allergy   . Anemia   . Clotting disorder ([place])   . Diabetes mellitus    recent high blood sugar and has RX but not taking  med -b/c she doesn't think she  is diabetic, just had lots of sugar in her diet when  dx'd.  Marland Kitchen Hypertension   . Shortness of breath    on exertion- has low hgb    Past Surgical History:  Procedure Laterality Date  . ABDOMINAL HYSTERECTOMY  09/11/2011   Procedure: HYSTERECTOMY ABDOMINAL;  Surgeon: Frederico Hamman, MD;  Location: [facility];  Service: Gynecology;  Laterality: N/A;  . NO PAST SURGERIES      There were no vitals filed for this visit.   Subjective Assessment - 01/21/18 1108    Subjective  This 57yo female was referred on 01/11/2018 by Dorcas Mcmurray, MD for PT with diagnosis of Cerebral Thrombosis with cerebral infarction. She was hospitalized 01/09/18 - 01/11/18. She was admitted with BLE weakness. She has tricompartmental osteoarthritis of both her knees. She had positive imaging for right basal ganglia/coronal raidiata likely secondary to small vessel disease    Pertinent History  CVA, DM, HTN, obesity, chronic knee pain,     Limitations  Lifting;Standing;Walking    Patient Stated Goals   to walk better, stop falling, get stronger in legs    Currently in Pain?  Yes    Pain Score  1  In last week, worst 9-10/10, best 0/10    Pain Location  Knee    Pain Orientation  Right;Left right>left    Pain Descriptors / Indicators  Pounding;Sharp    Pain Type  Chronic pain    Pain Onset  More than a month ago    Pain Frequency  Intermittent    Aggravating Factors   standing & walking    Pain Relieving Factors  sitting rest, rub it, massage feet, tylenol     Effect of Pain on Daily Activities  limits standing & walking         [facility] PT Assessment - 01/21/18 1100      Assessment   Medical Diagnosis  Cerebral Infarction    Referring Provider  Dorcas Mcmurray, MD    Onset Date/Surgical Date  01/09/18 Hospitalized with diagnosis of cerebral infarction    Hand Dominance  Right    Prior Therapy  none      Precautions   Precautions  Fall      Balance Screen   Has the patient fallen in the past 6 months  Yes    How many times?  2  trip    Has the patient had a decrease in activity level because of a fear of falling?   No    Is the patient reluctant to leave their home because of a fear of falling?   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[employer],       Posture/Postural Control   Posture/Postural Control  Postural limitations    Postural Limitations  Rounded Shoulders;Forward head;Increased lumbar lordosis;Flexed trunk      Tone   Assessment Location  Right Lower Extremity;Left Lower Extremity      ROM  / Strength   AROM / PROM / Strength  Strength      Strength   Overall Strength  Deficits    Strength Assessment Site  Hip;Knee;Ankle    Right/Left Hip  Right;Left    Right Hip Flexion  4-/5    Right Hip Extension  3+/5    Right Hip ABduction  3+/5    Left Hip Flexion  4-/5    Left Hip Extension  4-/5    Left Hip ABduction  4-/5    Right/Left Knee  Right;Left    Right Knee Flexion  3+/5    Right Knee Extension  4/5    Left Knee Flexion  4-/5    Left Knee Extension  4/5    Right/Left Ankle  Right;Left    Right Ankle Dorsiflexion  5/5    Right Ankle Plantar Flexion  3-/5    Left Ankle Dorsiflexion  5/5    Left Ankle Plantar Flexion  3-/5      Transfers   Transfers  Sit to Stand;Stand to Sit    Sit to Stand  6: Modified independent (Device/Increase time);Without upper extremity assist;From chair/3-in-1    Stand to Sit  6: Modified independent (Device/Increase time);Without upper extremity assist;To chair/3-in-1      Ambulation/Gait   Ambulation/Gait  Yes    Ambulation/Gait Assistance  5: Supervision;4: Min assist supervision straight path focused attention, MinA if not foc    Ambulation Distance (Feet)  200 Feet    Assistive device  None    Gait Pattern  Step-through pattern;Decreased step length - left;Decreased stance time - right;Decreased stride length;Decreased hip/knee flexion - right;Trunk flexed;Wide base of support    Ambulation Surface  Indoor;Level    Gait velocity  2.74 ft/sec    Stairs  Yes    Stairs Assistance  5: Supervision    Stairs Assistance Details (indicate cue type and reason)  slow movements, Knee extensors weak with ascend & descend    Stair Management Technique  Two rails;Alternating pattern;Forwards    Number of Stairs  4      Standardized Balance Assessment   Standardized Balance Assessment  Berg Balance Test;Timed Up and Go Test      Berg Balance Test   Sit to Stand  Able to stand  without using hands and stabilize independently    Standing  Unsupported  Able to stand safely 2 minutes    Sitting with Back Unsupported but Feet Supported on Floor or Stool  Able to sit safely and securely 2 minutes    Stand to Sit  Sits safely with minimal use of hands    Transfers  Able to transfer safely, minor use of hands    Standing Unsupported with Eyes Closed  Able to stand 10 seconds with supervision    Standing Ubsupported with Feet Together  Able to place feet together independently and stand for 1 minute with supervision    From Standing, Reach Forward with Outstretched Arm  Can reach forward >12 cm safely (5")    From Standing Position, Pick up Object from Floor  Able to pick up shoe, needs supervision    From Standing Position, Turn to Look Behind Over each Shoulder  Looks behind from both sides and weight shifts well    Turn 360 Degrees  Able to turn 360 degrees safely but slowly    Standing Unsupported, Alternately Place Feet on Step/Stool  Able to complete >2 steps/needs minimal assist    Standing Unsupported, One Foot in Front  Able to take small step independently and hold 30 seconds    Standing on One Leg  Able to lift leg independently and hold equal to or more than 3 seconds    Total Score  43      Timed Up and Go Test   Normal TUG (seconds)  11.46      Functional Gait  Assessment   Gait assessed   Yes    Gait Level Surface  Walks 20 ft, slow speed, abnormal gait pattern, evidence for imbalance or deviates 10-15 in outside of the 12 in walkway width. Requires more than 7 sec to ambulate 20 ft.    Change in Gait Speed  Able to change speed, demonstrates mild gait deviations, deviates 6-10 in outside of the 12 in walkway width, or no gait deviations, unable to achieve a major change in velocity, or uses a change in velocity, or uses an assistive device.    Gait with Horizontal Head Turns  Performs head turns smoothly with slight change in gait velocity (eg, minor disruption to smooth gait path), deviates 6-10 in outside 12 in  walkway width, or uses an assistive device.    Gait with Vertical Head Turns  Performs task with slight change in gait velocity (eg, minor disruption to smooth gait path), deviates 6 - 10 in outside 12 in walkway width or uses assistive device    Gait and Pivot Turn  Pivot turns safely in greater than 3 sec and stops with no loss of balance, or pivot turns safely within 3 sec and stops with mild imbalance, requires small steps to catch balance.    Step Over Obstacle  Is able to step over one shoe box (4.5 in total height) but must slow down and adjust steps to clear box safely. May require verbal cueing.    Gait with Narrow Base of Support  Ambulates less than 4 steps heel to toe or cannot perform without assistance.    Gait with Eyes Closed  Walks 20 ft, slow speed, abnormal gait pattern, evidence for imbalance, deviates 10-15 in outside 12 in walkway width. Requires more than 9 sec to ambulate 20 ft.    Ambulating Backwards  Walks 20 ft, slow speed, abnormal gait pattern, evidence for imbalance, deviates 10-15  in outside 12 in walkway width.    Steps  Alternating feet, must use rail.    Total Score  14      RLE Tone   RLE Tone  Within Functional Limits      LLE Tone   LLE Tone  Within Functional Limits                Objective measurements completed on examination: See above findings.                PT Short Term Goals - 01/22/18 1226      PT SHORT TERM GOAL #1   Title  Patient is independent with initial HEP. (All STGs Target Date: 03/06/2018)    Time  4    Period  Weeks    Status  New    Target Date  03/06/18      PT SHORT TERM GOAL #2   Title  [name] Balance >/=47/56    Time  4    Period  Weeks    Status  New    Target Date  03/06/18      PT SHORT TERM GOAL #3   Title  Functional Gait Assessment >/= 17/30    Time  4    Period  Weeks    Status  New    Target Date  03/06/18        PT Long Term Goals - 01/21/18 1223      PT LONG TERM GOAL #1    Title  Patient demonstrates & verbalizes understanding of ongoing HEP / fitness plan. (All LTGs Target Date 04/02/2018)    Time  8    Period  Weeks    Status  New    Target Date  04/02/18      PT LONG TERM GOAL #2   Title  Berg Balance >/= 52/56 to indicate lower fall risk.     Time  8    Period  Weeks    Status  New    Target Date  04/02/18      PT LONG TERM GOAL #3   Title  Functional Gait Assessment >19/30 to indicate lower fall risk.     Time  8    Period  Weeks    Status  New    Target Date  04/02/18      PT LONG TERM GOAL #4   Title  Patient ambulates 1000' outdoors including grass, ramps, curbs & stairs (1 rail) modified independent.     Time  8    Period  Weeks    Status  New    Target Date  04/02/18             Plan - 01/21/18 1216    Clinical Impression Statement  This 56yo patient has weakness, pain & mobility limitations associated with bilateral chronic knee arthritis. She had recent hospitalization with diagnosis of Cerebral infarction. Patient reports decline in her function over the last 3-4 months. She has weakness in bilateral LEs limiting her mobility. Berg Balance 43/56 indicates high fall risk with deficits. Timed Up-Go without device 11.46 seconds is within normal limits. Functional Gait Assessment 14/30 indicates high fall risk with gait activities. Patient appears would benefit skilled Physical Therapy to improve function.     History and Personal Factors relevant to plan of care:  CVA, DM, HTN, obesity, chronic knee pain,     Clinical Presentation  Stable    Clinical Decision Making  Low  Rehab Potential  Good    PT Frequency  2x / week    PT Duration  8 weeks    PT Treatment/Interventions  ADLs/Self Care Home Management;Canalith Repostioning;DME Instruction;Gait training;Stair training;Functional mobility training;Therapeutic activities;Therapeutic exercise;Balance training;Neuromuscular re-education;Patient/family education;Vestibular    PT Next  Visit Plan  instruct in HEP for strength & balance    Consulted and Agree with Plan of Care  Patient       Patient will benefit from skilled therapeutic intervention in order to improve the following deficits and impairments:  Abnormal gait, Decreased activity tolerance, Decreased balance, Decreased endurance, Decreased mobility, Decreased range of motion, Decreased strength, Dizziness, Postural dysfunction, Pain  Visit Diagnosis: Unsteadiness on feet  Other abnormalities of gait and mobility  Muscle weakness (generalized)  Stiffness of left knee, not elsewhere classified  Stiffness of right knee, not elsewhere classified  Chronic pain of right knee  Chronic pain of left knee     Problem List Patient Active Problem List   Diagnosis Date Noted  . Cerebral thrombosis with cerebral infarction 01/10/2018  . Hyperlipidemia   . Lower extremity weakness 01/09/2018  . Abnormality of gait   . Type 2 diabetes mellitus with complication, without long-term current use of insulin ([place]) 08/07/2015  . Bilateral knee pain 05/26/2012  . Varicose veins of lower extremities with other complications 26/94/8546  . Fibroid, uterus 04/18/2011  . Metrorrhagia 04/18/2011  . Hypertension 04/18/2011    Kien Mirsky PT, DPT 01/22/2018, 12:29 PM  [facility] [address] [place], [place], [address] Phone: [phone]   Fax:  [phone]  Name: Raelene Trew MRN: [id number] Date of Birth: 1961/03/06

## 2018-02-05 ENCOUNTER — Encounter: Payer: Self-pay | Admitting: Urgent Care

## 2018-02-05 ENCOUNTER — Other Ambulatory Visit: Payer: Self-pay

## 2018-02-05 ENCOUNTER — Ambulatory Visit (INDEPENDENT_AMBULATORY_CARE_PROVIDER_SITE_OTHER): Payer: BLUE CROSS/BLUE SHIELD | Admitting: Urgent Care

## 2018-02-05 VITALS — BP 106/70 | HR 66 | Temp 97.5°F | Ht 65.0 in | Wt 235.2 lb

## 2018-02-05 DIAGNOSIS — E119 Type 2 diabetes mellitus without complications: Secondary | ICD-10-CM | POA: Diagnosis not present

## 2018-02-05 DIAGNOSIS — Z1211 Encounter for screening for malignant neoplasm of colon: Secondary | ICD-10-CM

## 2018-02-05 DIAGNOSIS — Z23 Encounter for immunization: Secondary | ICD-10-CM

## 2018-02-05 DIAGNOSIS — Z1239 Encounter for other screening for malignant neoplasm of breast: Secondary | ICD-10-CM

## 2018-02-05 DIAGNOSIS — I10 Essential (primary) hypertension: Secondary | ICD-10-CM | POA: Diagnosis not present

## 2018-02-05 DIAGNOSIS — R5383 Other fatigue: Secondary | ICD-10-CM

## 2018-02-05 DIAGNOSIS — Z1159 Encounter for screening for other viral diseases: Secondary | ICD-10-CM | POA: Diagnosis not present

## 2018-02-05 DIAGNOSIS — I6330 Cerebral infarction due to thrombosis of unspecified cerebral artery: Secondary | ICD-10-CM

## 2018-02-05 DIAGNOSIS — Z1231 Encounter for screening mammogram for malignant neoplasm of breast: Secondary | ICD-10-CM

## 2018-02-05 MED ORDER — AMLODIPINE BESYLATE 5 MG PO TABS: 5.0000 mg | ORAL_TABLET | Freq: Every day | ORAL | 1 refills | Status: DC

## 2018-02-05 MED ORDER — CLOPIDOGREL BISULFATE 75 MG PO TABS: 75.0000 mg | ORAL_TABLET | Freq: Every day | ORAL | 3 refills | Status: DC

## 2018-02-05 MED ORDER — BLOOD GLUCOSE MONITOR KIT: PACK | 0 refills | Status: DC

## 2018-02-05 NOTE — Progress Notes (Signed)
MRN: [id number] DOB: April 18, 1961  Subjective:   Lindsay Travis is a 57 y.o. female presenting for follow up on myalgia.  At her last office visit we covered numerous items including muscle aches.  Today, she reports that these are improved but feels a heaviness and fatigue, lethargy.  She is taking all medications as prescribed except clopidogrel which she ran out of 3 days ago.  She needs a refill of this.  Denies confusion, dizziness, headaches, chest pain, belly pain, blood in her urine, rashes, foot problems.  Denies numbness or tingling, polydipsia, polyuria.  She does not have a PCP.  She would like to establish care here.  She has followed up with the [facility] sports medicine, was advised that she needs to continue her physical therapy with neurology.  She does not qualify as a candidate for knee replacement currently.  Reports that her knees are doing okay right now.  She has not yet seen neurology, is scheduled on 03/10/2018.  Denies smoking cigarettes.  [name] has a current medication list which includes the following prescription(s): amlodipine, aspirin, atorvastatin, glipizide, and hydrochlorothiazide. Also is allergic to lisinopril; losartan; and aspirin.  Lindsay Travis  has a past medical history of Allergy, Anemia, Clotting disorder ([place]), Diabetes mellitus, Hypertension, and Shortness of breath. Also  has a past surgical history that includes No past surgeries and Abdominal hysterectomy (09/11/2011).  Objective:   Vitals: BP 106/70   Pulse 66   Temp (!) 97.5 F (36.4 C) (Oral)   Ht 5\' 5"  (1.651 m)   Wt 235 lb 3.2 oz (106.7 kg)   LMP 09/10/2011   SpO2 98%   BMI 39.14 kg/m   BP Readings from Last 3 Encounters:  02/05/18 106/70  01/20/18 134/82  01/11/18 (!) 150/98   Physical Exam  Constitutional: She is oriented to person, place, and time. She appears well-developed and well-nourished.  HENT:  Mouth/Throat: Oropharynx is clear and moist.  Eyes: Pupils are equal, round,  and reactive to light. EOM are normal. No scleral icterus.  Cardiovascular: Normal rate, regular rhythm and intact distal pulses. Exam reveals no gallop and no friction rub.  No murmur heard. Pulmonary/Chest: No respiratory distress. She has no wheezes. She has no rales.  Neurological: She is alert and oriented to person, place, and time.  Skin: Skin is warm and dry.  Psychiatric: She has a normal mood and affect.   Diabetic Foot Exam - Simple   Simple Foot Form Diabetic Foot exam was performed with the following findings:  Yes 02/05/2018 10:26 AM  Visual Inspection No deformities, no ulcerations, no other skin breakdown bilaterally:  Yes Sensation Testing Intact to touch and monofilament testing bilaterally:  Yes Pulse Check Posterior Tibialis and Dorsalis pulse intact bilaterally:  Yes Comments     Assessment and Plan :   Other fatigue - Plan: CBC with Differential/Platelet, Thyroid Panel With TSH  Cerebral thrombosis with cerebral infarction  Type 2 diabetes mellitus without complication, without long-term current use of insulin (HCC) - Plan: [facility] DIABETES FOOT EXAM, Ambulatory referral to Ophthalmology  Essential hypertension  Morbid obesity ([place])  Colon cancer screening - Plan: Ambulatory referral to Gastroenterology  Breast cancer screening - Plan: MM Digital Screening  Need for Tdap vaccination  Need for prophylactic vaccination against Streptococcus pneumoniae (pneumococcus)  Encounter for hepatitis C screening test for low risk patient - Plan: Hepatitis C antibody  I refilled her clopidogrel today.  I counseled that her blood pressure may be too low and we  both agreed to decrease her amlodipine by half from 10 mg down to 5 mg once daily.  We will pursue a lot of her health maintenance items including screening mammogram, screening colonoscopy, diabetic eye exam, hepatitis C screen.  Labs are pending to look into her fatigue again.  Otherwise we will pursue plan as  above.  Patient will come back in 1 month, will do Pap smear at that time and recheck her blood pressure.  I let patient know that her appointment with neurology is on the second and she needs to keep this appointment.  She verbalized understanding.  Jaynee Eagles, PA-C [facility] [phone] 02/05/2018 9:43 AM

## 2018-02-05 NOTE — Patient Instructions (Addendum)
Please make sure that you are eating less or limit your carbohydrates such as white rice, potatoes, white breads, pasta, pizza, sodas, beer, sweet tea, fruit juices. Exercise can also help with your blood sugar. You can instead eat more salads, fruits, vegetables, whole grains, wheat, oats.       Diabetes Mellitus and Nutrition When you have diabetes (diabetes mellitus), it is very important to have healthy eating habits because your blood sugar (glucose) levels are greatly affected by what you eat and drink. Eating healthy foods in the appropriate amounts, at about the same times every day, can help you:  Control your blood glucose.  Lower your risk of heart disease.  Improve your blood pressure.  Reach or maintain a healthy weight.  Every person with diabetes is different, and each person has different needs for a meal plan. Your health care provider may recommend that you work with a diet and nutrition specialist (dietitian) to make a meal plan that is best for you. Your meal plan may vary depending on factors such as:  The calories you need.  The medicines you take.  Your weight.  Your blood glucose, blood pressure, and cholesterol levels.  Your activity level.  Other health conditions you have, such as heart or kidney disease.  How do carbohydrates affect me? Carbohydrates affect your blood glucose level more than any other type of food. Eating carbohydrates naturally increases the amount of glucose in your blood. Carbohydrate counting is a method for keeping track of how many carbohydrates you eat. Counting carbohydrates is important to keep your blood glucose at a healthy level, especially if you use insulin or take certain oral diabetes medicines. It is important to know how many carbohydrates you can safely have in each meal. This is different for every person. Your dietitian can help you calculate how many carbohydrates you should have at each meal and for snack. Foods that  contain carbohydrates include:  Bread, cereal, rice, pasta, and crackers.  Potatoes and corn.  Peas, beans, and lentils.  Milk and yogurt.  Fruit and juice.  Desserts, such as cakes, cookies, ice cream, and candy.  How does alcohol affect me? Alcohol can cause a sudden decrease in blood glucose (hypoglycemia), especially if you use insulin or take certain oral diabetes medicines. Hypoglycemia can be a life-threatening condition. Symptoms of hypoglycemia (sleepiness, dizziness, and confusion) are similar to symptoms of having too much alcohol. If your health care provider says that alcohol is safe for you, follow these guidelines:  Limit alcohol intake to no more than 1 drink per day for nonpregnant women and 2 drinks per day for men. One drink equals 12 oz of beer, 5 oz of wine, or 1 oz of hard liquor.  Do not drink on an empty stomach.  Keep yourself hydrated with water, diet soda, or unsweetened iced tea.  Keep in mind that regular soda, juice, and other mixers may contain a lot of sugar and must be counted as carbohydrates.  What are tips for following this plan? Reading food labels  Start by checking the serving size on the label. The amount of calories, carbohydrates, fats, and other nutrients listed on the label are based on one serving of the food. Many foods contain more than one serving per package.  Check the total grams (g) of carbohydrates in one serving. You can calculate the number of servings of carbohydrates in one serving by dividing the total carbohydrates by 15. For example, if a food has 30  g of total carbohydrates, it would be equal to 2 servings of carbohydrates.  Check the number of grams (g) of saturated and trans fats in one serving. Choose foods that have low or no amount of these fats.  Check the number of milligrams (mg) of sodium in one serving. Most people should limit total sodium intake to less than 2,300 mg per day.  Always check the nutrition  information of foods labeled as "low-fat" or "nonfat". These foods may be higher in added sugar or refined carbohydrates and should be avoided.  Talk to your dietitian to identify your daily goals for nutrients listed on the label. Shopping  Avoid buying canned, premade, or processed foods. These foods tend to be high in fat, sodium, and added sugar.  Shop around the outside edge of the grocery store. This includes fresh fruits and vegetables, bulk grains, fresh meats, and fresh dairy. Cooking  Use low-heat cooking methods, such as baking, instead of high-heat cooking methods like deep frying.  Cook using healthy oils, such as olive, canola, or sunflower oil.  Avoid cooking with butter, cream, or high-fat meats. Meal planning  Eat meals and snacks regularly, preferably at the same times every day. Avoid going long periods of time without eating.  Eat foods high in fiber, such as fresh fruits, vegetables, beans, and whole grains. Talk to your dietitian about how many servings of carbohydrates you can eat at each meal.  Eat 4-6 ounces of lean protein each day, such as lean meat, chicken, fish, eggs, or tofu. 1 ounce is equal to 1 ounce of meat, chicken, or fish, 1 egg, or 1/4 cup of tofu.  Eat some foods each day that contain healthy fats, such as avocado, nuts, seeds, and fish. Lifestyle   Check your blood glucose regularly.  Exercise at least 30 minutes 5 or more days each week, or as told by your health care provider.  Take medicines as told by your health care provider.  Do not use any products that contain nicotine or tobacco, such as cigarettes and e-cigarettes. If you need help quitting, ask your health care provider.  Work with a Social worker or diabetes educator to identify strategies to manage stress and any emotional and social challenges. What are some questions to ask my health care provider?  Do I need to meet with a diabetes educator?  Do I need to meet with a  dietitian?  What number can I call if I have questions?  When are the best times to check my blood glucose? Where to find more information:  American Diabetes Association: diabetes.org/food-and-fitness/food  Academy of Nutrition and Dietetics: PokerClues.dk  [employer] of Diabetes and Digestive and Kidney Diseases (NIH): ContactWire.be Summary  A healthy meal plan will help you control your blood glucose and maintain a healthy lifestyle.  Working with a diet and nutrition specialist (dietitian) can help you make a meal plan that is best for you.  Keep in mind that carbohydrates and alcohol have immediate effects on your blood glucose levels. It is important to count carbohydrates and to use alcohol carefully. This information is not intended to replace advice given to you by your health care provider. Make sure you discuss any questions you have with your health care provider. Document Released: 05/23/2005 Document Revised: 09/30/2016 Document Reviewed: 09/30/2016 Elsevier Interactive Patient Education  2018 [employer].     IF you received an x-ray today, you will receive an invoice from [facility]. Please contact [facility] at [phone]  with questions or concerns regarding your invoice.   IF you received labwork today, you will receive an invoice from [facility]. Please contact [facility] at [phone] with questions or concerns regarding your invoice.   Our billing staff will not be able to assist you with questions regarding bills from these companies.  You will be contacted with the lab results as soon as they are available. The fastest way to get your results is to activate your My Chart account. Instructions are located on the last page of this paperwork. If you have not heard from [place] regarding the results in 2 weeks, please  contact this office.

## 2018-02-06 LAB — CBC WITH DIFFERENTIAL/PLATELET
BASOS: 0 %
Basophils Absolute: 0 10*3/uL (ref 0.0–0.2)
EOS (ABSOLUTE): 0.1 10*3/uL (ref 0.0–0.4)
EOS: 1 %
HEMATOCRIT: 38.9 % (ref 34.0–46.6)
Hemoglobin: 13.2 g/dL (ref 11.1–15.9)
IMMATURE GRANS (ABS): 0 10*3/uL (ref 0.0–0.1)
Immature Granulocytes: 0 %
LYMPHS: 37 %
Lymphocytes Absolute: 1.5 10*3/uL (ref 0.7–3.1)
MCH: 28 pg (ref 26.6–33.0)
MCHC: 33.9 g/dL (ref 31.5–35.7)
MCV: 83 fL (ref 79–97)
MONOS ABS: 0.2 10*3/uL (ref 0.1–0.9)
Monocytes: 5 %
NEUTROS ABS: 2.3 10*3/uL (ref 1.4–7.0)
NEUTROS PCT: 57 %
PLATELETS: 199 10*3/uL (ref 150–450)
RBC: 4.71 x10E6/uL (ref 3.77–5.28)
RDW: 14.5 % (ref 12.3–15.4)
WBC: 4.1 10*3/uL (ref 3.4–10.8)

## 2018-02-06 LAB — THYROID PANEL WITH TSH
Free Thyroxine Index: 1.9 (ref 1.2–4.9)
T3 Uptake Ratio: 27 % (ref 24–39)
T4, Total: 7 ug/dL (ref 4.5–12.0)
TSH: 2.81 u[IU]/mL (ref 0.450–4.500)

## 2018-02-06 LAB — HEPATITIS C ANTIBODY

## 2018-02-09 ENCOUNTER — Ambulatory Visit: Payer: BLUE CROSS/BLUE SHIELD | Attending: Family Medicine | Admitting: Physical Therapy

## 2018-02-09 ENCOUNTER — Encounter: Payer: Self-pay | Admitting: Physical Therapy

## 2018-02-09 DIAGNOSIS — M25562 Pain in left knee: Secondary | ICD-10-CM | POA: Diagnosis not present

## 2018-02-09 DIAGNOSIS — G8929 Other chronic pain: Secondary | ICD-10-CM | POA: Insufficient documentation

## 2018-02-09 DIAGNOSIS — M25661 Stiffness of right knee, not elsewhere classified: Secondary | ICD-10-CM | POA: Diagnosis not present

## 2018-02-09 DIAGNOSIS — M25561 Pain in right knee: Secondary | ICD-10-CM | POA: Diagnosis not present

## 2018-02-09 DIAGNOSIS — R2681 Unsteadiness on feet: Secondary | ICD-10-CM | POA: Diagnosis not present

## 2018-02-09 DIAGNOSIS — R2689 Other abnormalities of gait and mobility: Secondary | ICD-10-CM | POA: Insufficient documentation

## 2018-02-09 DIAGNOSIS — M25662 Stiffness of left knee, not elsewhere classified: Secondary | ICD-10-CM | POA: Insufficient documentation

## 2018-02-09 DIAGNOSIS — M6281 Muscle weakness (generalized): Secondary | ICD-10-CM

## 2018-02-09 NOTE — Patient Instructions (Addendum)
Single Leg Balance: Eyes Open    TAP COUNTER WITH HANDS>    Stand on right leg with eyes open. Hold __15_ seconds. _3__ reps EACH LEG  __1-2_ times per day.  http://ggbe.exer.us/5   Copyright  VHI. All rights reserved.  FUNCTIONAL MOBILITY: Marching - Standing    March along counter:  by lifting left leg up, then right. Alternate, stepping forward __4_ reps per set, _1-2__ sets per day,Copyright  VHI. All rights reserved.  Tandem Stance    Right foot in front of left, heel touching toe both feet "straight ahead". Stand on Foot Triangle of Support with both feet. Balance in this position _30__ seconds. Do with left foot in front of right.  Copyright  VHI. All rights reserved.  Tandem Walking    Walk BY COUNTER with each foot directly in front of other, heel of one foot touching toes of other foot with each step. Both feet straight ahead.   Copyright  VHI. All rights reserved.

## 2018-02-09 NOTE — Therapy (Signed)
[facility] [address] [place], [place], [address] Phone: [phone]   Fax:  [phone]  Physical Therapy Treatment  Patient Details  Name: Lindsay Travis MRN: [id number] Date of Birth: [birth year] Referring Provider: Dorcas Mcmurray, MD   Encounter Date: 02/09/2018  PT End of Session - 02/09/18 1100    Visit Number  2    Number of Visits  17    Date for PT Re-Evaluation  04/02/18    Authorization Type  BCBS    PT Start Time  1020    PT Stop Time  1100    PT Time Calculation (min)  40 min    Equipment Utilized During Treatment  Gait belt    Activity Tolerance  Patient tolerated treatment well    Behavior During Therapy  WFL for tasks assessed/performed       Past Medical History:  Diagnosis Date  . Allergy   . Anemia   . Clotting disorder ([place])   . Diabetes mellitus    recent high blood sugar and has RX but not taking  med -b/c she doesn't think she  is diabetic, just had lots of sugar in her diet when  dx'd.  Marland Kitchen Hypertension   . Shortness of breath    on exertion- has low hgb    Past Surgical History:  Procedure Laterality Date  . ABDOMINAL HYSTERECTOMY  09/11/2011   Procedure: HYSTERECTOMY ABDOMINAL;  Surgeon: Frederico Hamman, MD;  Location: [facility];  Service: Gynecology;  Laterality: N/A;  . NO PAST SURGERIES      There were no vitals filed for this visit.  Subjective Assessment - 02/09/18 1023    Subjective  LE/ knee pain is better then before but legs feel heavy and she feels off balanced. Pt walks daily for exercise, sometime at the pool and performs functional squat exercise.   Pertinent History  CVA, DM, HTN, obesity, chronic knee pain,     Limitations  Lifting;Standing;Walking    Patient Stated Goals  to walk better, stop falling, get stronger in legs    Currently in Pain?  Yes    Pain Score  1     Pain Location  Knee    Pain Orientation  Right;Left    Pain Descriptors / Indicators  Shooting     Pain Type  Chronic pain    Pain Radiating Towards  down anterior Leg    Pain Onset  More than a month ago    Pain Frequency  Intermittent                            Balance Exercises - 02/09/18 1049      Balance Exercises: Standing   Tandem Stance  Eyes open;Intermittent upper extremity support;2 reps;30 secs progress to tandem gait along counter with intermittent UE u    SLS  Eyes open;Intermittent upper extremity support;2 reps;15 secs    Standing, One Foot on a Step  4 inch stepping forward, alt tapping cone progressing with upright     Marching Limitations  with intermittent UE support, cues to hold a SLS and initial heel strike when stepping forward        PT Education - 02/09/18 1103    Education Details  Initiated HEP for balance    Person(s) Educated  Patient    Methods  Explanation;Demonstration;Verbal cues    Comprehension  Verbalized understanding;Returned demonstration;Verbal cues required;Need further instruction  PT Short Term Goals - 01/22/18 1226      PT SHORT TERM GOAL #1   Title  Patient is independent with initial HEP. (All STGs Target Date: 03/06/2018)    Time  4    Period  Weeks    Status  New    Target Date  03/06/18      PT SHORT TERM GOAL #2   Title  [name] Balance >/=47/56    Time  4    Period  Weeks    Status  New    Target Date  03/06/18      PT SHORT TERM GOAL #3   Title  Functional Gait Assessment >/= 17/30    Time  4    Period  Weeks    Status  New    Target Date  03/06/18        PT Long Term Goals - 01/21/18 1223      PT LONG TERM GOAL #1   Title  Patient demonstrates & verbalizes understanding of ongoing HEP / fitness plan. (All LTGs Target Date 04/02/2018)    Time  8    Period  Weeks    Status  New    Target Date  04/02/18      PT LONG TERM GOAL #2   Title  Berg Balance >/= 52/56 to indicate lower fall risk.     Time  8    Period  Weeks    Status  New    Target Date  04/02/18      PT LONG  TERM GOAL #3   Title  Functional Gait Assessment >19/30 to indicate lower fall risk.     Time  8    Period  Weeks    Status  New    Target Date  04/02/18      PT LONG TERM GOAL #4   Title  Patient ambulates 1000' outdoors including grass, ramps, curbs & stairs (1 rail) modified independent.     Time  8    Period  Weeks    Status  New    Target Date  04/02/18            Plan - 02/09/18 1303    Clinical Impression Statement  Initiated HEP for balance; pt requires intermittent UE support for SLS and tandem stance activities.  Encouraged pt to continue walking daily    Rehab Potential  Good    PT Frequency  2x / week    PT Duration  8 weeks    PT Treatment/Interventions  ADLs/Self Care Home Management;Canalith Repostioning;DME Instruction;Gait training;Stair training;Functional mobility training;Therapeutic activities;Therapeutic exercise;Balance training;Neuromuscular re-education;Patient/family education;Vestibular    PT Next Visit Plan  dynamic standing balance on compliant surface.  Dynamic gait.    Consulted and Agree with Plan of Care  Patient       Patient will benefit from skilled therapeutic intervention in order to improve the following deficits and impairments:  Abnormal gait, Decreased activity tolerance, Decreased balance, Decreased endurance, Decreased mobility, Decreased range of motion, Decreased strength, Dizziness, Postural dysfunction, Pain  Visit Diagnosis: Unsteadiness on feet  Other abnormalities of gait and mobility  Muscle weakness (generalized)  Stiffness of left knee, not elsewhere classified  Stiffness of right knee, not elsewhere classified  Chronic pain of right knee  Chronic pain of left knee     Problem List Patient Active Problem List   Diagnosis Date Noted  . Cerebral thrombosis with cerebral infarction 01/10/2018  . Hyperlipidemia   .  Lower extremity weakness 01/09/2018  . Abnormality of gait   . Type 2 diabetes mellitus with  complication, without long-term current use of insulin ([place]) 08/07/2015  . Bilateral knee pain 05/26/2012  . Varicose veins of lower extremities with other complications 43/20/0379  . Fibroid, uterus 04/18/2011  . Metrorrhagia 04/18/2011  . Hypertension 04/18/2011    Bjorn Loser, PTA  02/09/18, 1:10 PM [facility] [address] [place], [place], [address] Phone: [phone]   Fax:  [phone]  Name: Lindsay Travis MRN: [id number] Date of Birth: [birth year]

## 2018-02-10 ENCOUNTER — Other Ambulatory Visit: Payer: Self-pay | Admitting: Family Medicine

## 2018-02-12 ENCOUNTER — Ambulatory Visit: Payer: BLUE CROSS/BLUE SHIELD | Admitting: Physical Therapy

## 2018-02-12 ENCOUNTER — Encounter: Payer: Self-pay | Admitting: Physical Therapy

## 2018-02-12 DIAGNOSIS — M25661 Stiffness of right knee, not elsewhere classified: Secondary | ICD-10-CM

## 2018-02-12 DIAGNOSIS — M25562 Pain in left knee: Secondary | ICD-10-CM

## 2018-02-12 DIAGNOSIS — R2681 Unsteadiness on feet: Secondary | ICD-10-CM | POA: Diagnosis not present

## 2018-02-12 DIAGNOSIS — M6281 Muscle weakness (generalized): Secondary | ICD-10-CM

## 2018-02-12 DIAGNOSIS — R2689 Other abnormalities of gait and mobility: Secondary | ICD-10-CM | POA: Diagnosis not present

## 2018-02-12 DIAGNOSIS — M25662 Stiffness of left knee, not elsewhere classified: Secondary | ICD-10-CM | POA: Diagnosis not present

## 2018-02-12 DIAGNOSIS — M25561 Pain in right knee: Secondary | ICD-10-CM | POA: Diagnosis not present

## 2018-02-12 DIAGNOSIS — G8929 Other chronic pain: Secondary | ICD-10-CM | POA: Diagnosis not present

## 2018-02-13 NOTE — Therapy (Signed)
[facility] [address] [place], [place], [address] Phone: [phone]   Fax:  [phone]  Physical Therapy Treatment  Patient Details  Name: Lindsay Travis MRN: [id number] Date of Birth: August 27, 1961 Referring Provider: Dorcas Mcmurray, MD   Encounter Date: 02/12/2018  PT End of Session - 02/12/18 1122    Visit Number  3    Number of Visits  17    Date for PT Re-Evaluation  04/02/18    Authorization Type  BCBS    PT Start Time  0930    PT Stop Time  1015    PT Time Calculation (min)  45 min    Equipment Utilized During Treatment  Gait belt    Activity Tolerance  Patient tolerated treatment well;Patient limited by fatigue    Behavior During Therapy  [facility] for tasks assessed/performed       Past Medical History:  Diagnosis Date  . Allergy   . Anemia   . Clotting disorder ([place])   . Diabetes mellitus    recent high blood sugar and has RX but not taking  med -b/c she doesn't think she  is diabetic, just had lots of sugar in her diet when  dx'd.  Marland Kitchen Hypertension   . Shortness of breath    on exertion- has low hgb    Past Surgical History:  Procedure Laterality Date  . ABDOMINAL HYSTERECTOMY  09/11/2011   Procedure: HYSTERECTOMY ABDOMINAL;  Surgeon: Frederico Hamman, MD;  Location: [facility];  Service: Gynecology;  Laterality: N/A;  . NO PAST SURGERIES      There were no vitals filed for this visit.  Subjective Assessment - 02/12/18 0930    Subjective  No falls but she feels weak and tired.     Pertinent History  CVA, DM, HTN, obesity, chronic knee pain,     Limitations  Lifting;Standing;Walking    Patient Stated Goals  to walk better, stop falling, get stronger in legs    Currently in Pain?  No/denies    Pain Onset  More than a month ago                       [facility] Adult PT Treatment/Exercise - 02/12/18 0930      Ambulation/Gait   Ambulation/Gait  Yes    Ambulation/Gait Assistance  5:  Supervision;4: Min guard    Ambulation/Gait Assistance Details  tactile cues for balance. Patient caught foot 5 times during session requiring minA to prevent fall.     Ambulation Distance (Feet)  200 Feet    Assistive device  None    Ambulation Surface  Indoor;Level    Stairs  Yes    Stairs Assistance  5: Supervision    Stairs Assistance Details (indicate cue type and reason)  PT demo, instructed in technique with 2 hands on single rail with step-to pattern.     Stair Management Technique  One rail Right;One rail Left;Two rails;Step to pattern;Forwards;Sideways    Number of Stairs  4 4 reps      High Level Balance   High Level Balance Activities  Side stepping;Marching forwards;Marching backwards;Head turns heel walking & toe walking    High Level Balance Comments  tactile cues for balance & maintaining path.      Knee/Hip Exercises: Aerobic   Stepper  SciFit Recumbent stepper Level 3 with BUEs & BLEs verbal cues on full range          Balance Exercises -  02/12/18 0930      Balance Exercises: Standing   Tandem Stance  Eyes open;Intermittent upper extremity support;2 reps;30 secs parallel bars    SLS  Eyes open;Solid surface;5 reps;Modified rolling tennis ball ant/post, med/lat & circles     Step Ups  4 inch;Forward;Lateral;UE support 1 5 reps each    Gait with Head Turns  Forward;5 reps hall for visual reference with tactile cues,     Retro Gait  5 reps    Sidestepping  5 reps    Marching Limitations  with intermittent UE support, cues to hold a SLS and initial heel strike when stepping forward    Heel Raises Limitations  10 reps with 3 sec hold intermittent UE support    Toe Raise Limitations  10 reps with 3 sec hold intermittent UE support          PT Short Term Goals - 02/12/18 1123      PT SHORT TERM GOAL #1   Title  Patient is independent with initial HEP. (All STGs Target Date: 03/06/2018)    Time  4    Period  Weeks    Status  On-going    Target Date  03/06/18       PT SHORT TERM GOAL #2   Title  [name] Balance >/=47/56    Time  4    Period  Weeks    Status  On-going    Target Date  03/06/18      PT SHORT TERM GOAL #3   Title  Functional Gait Assessment >/= 17/30    Time  4    Period  Weeks    Status  On-going    Target Date  03/06/18        PT Long Term Goals - 02/12/18 1124      PT LONG TERM GOAL #1   Title  Patient demonstrates & verbalizes understanding of ongoing HEP / fitness plan. (All LTGs Target Date 04/02/2018)    Time  8    Period  Weeks    Status  On-going    Target Date  04/02/18      PT LONG TERM GOAL #2   Title  Berg Balance >/= 52/56 to indicate lower fall risk.     Time  8    Period  Weeks    Status  On-going    Target Date  04/02/18      PT LONG TERM GOAL #3   Title  Functional Gait Assessment >19/30 to indicate lower fall risk.     Time  8    Period  Weeks    Status  On-going    Target Date  04/02/18      PT LONG TERM GOAL #4   Title  Patient ambulates 1000' outdoors including grass, ramps, curbs & stairs (1 rail) modified independent.     Time  8    Period  Weeks    Status  On-going    Target Date  04/02/18            Plan - 02/13/18 1124    Clinical Impression Statement  Patient is limited by fatigue with standing & gait activities. Today's session focused on standing balance & gait activities progressing time & difficulty.     Rehab Potential  Good    PT Frequency  2x / week    PT Duration  8 weeks    PT Treatment/Interventions  ADLs/Self Care Home Management;Canalith Repostioning;DME Instruction;Gait training;Stair training;Functional mobility  training;Therapeutic activities;Therapeutic exercise;Balance training;Neuromuscular re-education;Patient/family education;Vestibular    PT Next Visit Plan  dynamic standing balance on compliant surface.  Dynamic gait.    Consulted and Agree with Plan of Care  Patient       Patient will benefit from skilled therapeutic intervention in order to  improve the following deficits and impairments:  Abnormal gait, Decreased activity tolerance, Decreased balance, Decreased endurance, Decreased mobility, Decreased range of motion, Decreased strength, Dizziness, Postural dysfunction, Pain  Visit Diagnosis: Unsteadiness on feet  Other abnormalities of gait and mobility  Muscle weakness (generalized)  Stiffness of left knee, not elsewhere classified  Stiffness of right knee, not elsewhere classified  Chronic pain of right knee  Chronic pain of left knee     Problem List Patient Active Problem List   Diagnosis Date Noted  . Cerebral thrombosis with cerebral infarction 01/10/2018  . Hyperlipidemia   . Lower extremity weakness 01/09/2018  . Abnormality of gait   . Type 2 diabetes mellitus with complication, without long-term current use of insulin ([place]) 08/07/2015  . Bilateral knee pain 05/26/2012  . Varicose veins of lower extremities with other complications 03/47/4259  . Fibroid, uterus 04/18/2011  . Metrorrhagia 04/18/2011  . Hypertension 04/18/2011    Keyauna Graefe PT, DPT 02/13/2018, 11:44 AM  [facility] [address] [place], [place], [address] Phone: [phone]   Fax:  [phone]  Name: Lindsay Travis MRN: [id number] Date of Birth: 1960/11/04

## 2018-02-16 ENCOUNTER — Ambulatory Visit: Payer: BLUE CROSS/BLUE SHIELD | Admitting: Physical Therapy

## 2018-02-16 ENCOUNTER — Encounter: Payer: Self-pay | Admitting: Physical Therapy

## 2018-02-16 DIAGNOSIS — M25562 Pain in left knee: Secondary | ICD-10-CM | POA: Diagnosis not present

## 2018-02-16 DIAGNOSIS — G8929 Other chronic pain: Secondary | ICD-10-CM | POA: Diagnosis not present

## 2018-02-16 DIAGNOSIS — M25561 Pain in right knee: Secondary | ICD-10-CM

## 2018-02-16 DIAGNOSIS — R2689 Other abnormalities of gait and mobility: Secondary | ICD-10-CM

## 2018-02-16 DIAGNOSIS — M25662 Stiffness of left knee, not elsewhere classified: Secondary | ICD-10-CM | POA: Diagnosis not present

## 2018-02-16 DIAGNOSIS — M25661 Stiffness of right knee, not elsewhere classified: Secondary | ICD-10-CM

## 2018-02-16 DIAGNOSIS — R2681 Unsteadiness on feet: Secondary | ICD-10-CM

## 2018-02-16 DIAGNOSIS — M6281 Muscle weakness (generalized): Secondary | ICD-10-CM

## 2018-02-17 NOTE — Therapy (Signed)
[facility] [address] [place], [place], [address] Phone: [phone]   Fax:  [phone]  Physical Therapy Treatment  Patient Details  Name: Lindsay Travis MRN: [id number] Date of Birth: October 01, 1960 Referring Provider: Dorcas Mcmurray, MD   Encounter Date: 02/16/2018  PT End of Session - 02/16/18 1114    Visit Number  4    Number of Visits  17    Date for PT Re-Evaluation  04/02/18    Authorization Type  BCBS    PT Start Time  1113 pt late for appt today    PT Stop Time  1146    PT Time Calculation (min)  33 min    Equipment Utilized During Treatment  Gait belt    Activity Tolerance  Patient tolerated treatment well;Patient limited by fatigue;No increased pain    Behavior During Therapy  WFL for tasks assessed/performed       Past Medical History:  Diagnosis Date  . Allergy   . Anemia   . Clotting disorder ([place])   . Diabetes mellitus    recent high blood sugar and has RX but not taking  med -b/c she doesn't think she  is diabetic, just had lots of sugar in her diet when  dx'd.  Marland Kitchen Hypertension   . Shortness of breath    on exertion- has low hgb    Past Surgical History:  Procedure Laterality Date  . ABDOMINAL HYSTERECTOMY  09/11/2011   Procedure: HYSTERECTOMY ABDOMINAL;  Surgeon: Frederico Hamman, MD;  Location: [facility];  Service: Gynecology;  Laterality: N/A;  . NO PAST SURGERIES      There were no vitals filed for this visit.  Subjective Assessment - 02/16/18 1113    Subjective  No new complaints. No falls or pain. Does report "heaviness' in bil LE's and overall fatigue/weakness.     Pertinent History  CVA, DM, HTN, obesity, chronic knee pain,     Limitations  Lifting;Standing;Walking    Patient Stated Goals  to walk better, stop falling, get stronger in legs    Currently in Pain?  No/denies    Pain Score  0-No pain            [facility] Adult PT Treatment/Exercise - 02/16/18 1115      High Level  Balance   High Level Balance Activities  Side stepping;Marching forwards;Marching backwards;Tandem walking tandem/heel/toe gait fwd/bwd    High Level Balance Comments  on both red mats next to counter: 3 laps each with intermittent touch to counter for balance. min guard to min assist for balance with cues on posture and ex form/technique.           Balance Exercises - 02/16/18 1131      Balance Exercises: Standing   Standing Eyes Closed  Narrow base of support (BOS);Head turns;Foam/compliant surface;Other reps (comment);30 secs;Limitations    Balance Beam  standing across blue foam beam: alternating fwd stepping to floor/back onto beam, then alternating bwd stepping to floor/back onto beam, no UE support with min guard to min assist. cues on step length, step height and weight shifting onto stepping leg with this activity to assist with balance and stepping strategies.      Sit to Stand Time  with feet across black foam beam: sit<>stands x 5 reps with UE assist, cues for full standing and, slow/controlled descent      Balance Exercises: Standing   Standing Eyes Closed Limitations  standing across blue foam beam: EC no head  movements, progressing to EC head movements left<>right, up<>down and diagonals both ways with min guard to min assist for balance. cues on posture and weight shifting to assist with balance.                             PT Short Term Goals - 02/12/18 1123      PT SHORT TERM GOAL #1   Title  Patient is independent with initial HEP. (All STGs Target Date: 03/06/2018)    Time  4    Period  Weeks    Status  On-going    Target Date  03/06/18      PT SHORT TERM GOAL #2   Title  [name] Balance >/=47/56    Time  4    Period  Weeks    Status  On-going    Target Date  03/06/18      PT SHORT TERM GOAL #3   Title  Functional Gait Assessment >/= 17/30    Time  4    Period  Weeks    Status  On-going    Target Date  03/06/18        PT Long Term Goals - 02/12/18  1124      PT LONG TERM GOAL #1   Title  Patient demonstrates & verbalizes understanding of ongoing HEP / fitness plan. (All LTGs Target Date 04/02/2018)    Time  8    Period  Weeks    Status  On-going    Target Date  04/02/18      PT LONG TERM GOAL #2   Title  Berg Balance >/= 52/56 to indicate lower fall risk.     Time  8    Period  Weeks    Status  On-going    Target Date  04/02/18      PT LONG TERM GOAL #3   Title  Functional Gait Assessment >19/30 to indicate lower fall risk.     Time  8    Period  Weeks    Status  On-going    Target Date  04/02/18      PT LONG TERM GOAL #4   Title  Patient ambulates 1000' outdoors including grass, ramps, curbs & stairs (1 rail) modified independent.     Time  8    Period  Weeks    Status  On-going    Target Date  04/02/18            Plan - 02/16/18 1115    Clinical Impression Statement  Today's skilled session continued to focus on high level balance activities with only increased LE fatigue reported. This was addressed by short, seated rest breaks throughout session. Pt is progressing toward goals and should benefit from continued PT to progress toward unmet goals.                               Rehab Potential  Good    PT Frequency  2x / week    PT Duration  8 weeks    PT Treatment/Interventions  ADLs/Self Care Home Management;Canalith Repostioning;DME Instruction;Gait training;Stair training;Functional mobility training;Therapeutic activities;Therapeutic exercise;Balance training;Neuromuscular re-education;Patient/family education;Vestibular    PT Next Visit Plan  dynamic standing balance on compliant surface.  Dynamic gait, dual tasking with gait.     Consulted and Agree with Plan of Care  Patient  Patient will benefit from skilled therapeutic intervention in order to improve the following deficits and impairments:  Abnormal gait, Decreased activity tolerance, Decreased balance, Decreased endurance, Decreased mobility,  Decreased range of motion, Decreased strength, Dizziness, Postural dysfunction, Pain  Visit Diagnosis: Unsteadiness on feet  Other abnormalities of gait and mobility  Muscle weakness (generalized)  Stiffness of left knee, not elsewhere classified  Stiffness of right knee, not elsewhere classified  Chronic pain of right knee  Chronic pain of left knee     Problem List Patient Active Problem List   Diagnosis Date Noted  . Cerebral thrombosis with cerebral infarction 01/10/2018  . Hyperlipidemia   . Lower extremity weakness 01/09/2018  . Abnormality of gait   . Type 2 diabetes mellitus with complication, without long-term current use of insulin ([place]) 08/07/2015  . Bilateral knee pain 05/26/2012  . Varicose veins of lower extremities with other complications 07/08/1313  . Fibroid, uterus 04/18/2011  . Metrorrhagia 04/18/2011  . Hypertension 04/18/2011    Willow Ora, PTA, [facility] [address], [place], [place] [address] [phone] 02/17/18, 12:54 PM   Name: Shadai Mcclane MRN: [id number] Date of Birth: 1961/02/16

## 2018-02-19 ENCOUNTER — Ambulatory Visit: Payer: BLUE CROSS/BLUE SHIELD | Admitting: Physical Therapy

## 2018-02-23 ENCOUNTER — Ambulatory Visit: Payer: BLUE CROSS/BLUE SHIELD | Admitting: Physical Therapy

## 2018-02-23 ENCOUNTER — Encounter: Payer: Self-pay | Admitting: Physical Therapy

## 2018-02-23 DIAGNOSIS — M25661 Stiffness of right knee, not elsewhere classified: Secondary | ICD-10-CM | POA: Diagnosis not present

## 2018-02-23 DIAGNOSIS — R2689 Other abnormalities of gait and mobility: Secondary | ICD-10-CM | POA: Diagnosis not present

## 2018-02-23 DIAGNOSIS — G8929 Other chronic pain: Secondary | ICD-10-CM | POA: Diagnosis not present

## 2018-02-23 DIAGNOSIS — M25561 Pain in right knee: Secondary | ICD-10-CM | POA: Diagnosis not present

## 2018-02-23 DIAGNOSIS — I672 Cerebral atherosclerosis: Secondary | ICD-10-CM | POA: Diagnosis not present

## 2018-02-23 DIAGNOSIS — M25662 Stiffness of left knee, not elsewhere classified: Secondary | ICD-10-CM | POA: Diagnosis not present

## 2018-02-23 DIAGNOSIS — R2681 Unsteadiness on feet: Secondary | ICD-10-CM

## 2018-02-23 DIAGNOSIS — M6281 Muscle weakness (generalized): Secondary | ICD-10-CM

## 2018-02-23 DIAGNOSIS — M25562 Pain in left knee: Secondary | ICD-10-CM | POA: Diagnosis not present

## 2018-02-23 DIAGNOSIS — I63311 Cerebral infarction due to thrombosis of right middle cerebral artery: Secondary | ICD-10-CM | POA: Diagnosis not present

## 2018-02-24 NOTE — Therapy (Signed)
[facility] [address] [place], [place], [address] Phone: [phone]   Fax:  [phone]  Physical Therapy Treatment  Patient Details  Name: Lindsay Travis MRN: [id number] Date of Birth: 1961/08/03 Referring Provider: Dorcas Mcmurray, MD   Encounter Date: 02/23/2018  PT End of Session - 02/23/18 1108    Visit Number  5    Number of Visits  17    Date for PT Re-Evaluation  04/02/18    Authorization Type  BCBS    PT Start Time  1104    PT Stop Time  1146    PT Time Calculation (min)  42 min    Equipment Utilized During Treatment  Gait belt    Activity Tolerance  Patient tolerated treatment well;Patient limited by fatigue;No increased pain    Behavior During Therapy  WFL for tasks assessed/performed       Past Medical History:  Diagnosis Date  . Allergy   . Anemia   . Clotting disorder ([place])   . Diabetes mellitus    recent high blood sugar and has RX but not taking  med -b/c she doesn't think she  is diabetic, just had lots of sugar in her diet when  dx'd.  Marland Kitchen Hypertension   . Shortness of breath    on exertion- has low hgb    Past Surgical History:  Procedure Laterality Date  . ABDOMINAL HYSTERECTOMY  09/11/2011   Procedure: HYSTERECTOMY ABDOMINAL;  Surgeon: Frederico Hamman, MD;  Location: [facility];  Service: Gynecology;  Laterality: N/A;  . NO PAST SURGERIES      There were no vitals filed for this visit.  Subjective Assessment - 02/23/18 1105    Subjective  No new complaints. No falls. Legs are " a little better", still with heaviness. Does also report new onset of lower back pain that started on Saturday. She went to the pool and did her walking/ex program and had the pain afterwards. Encouraged pt to try ice and heat to see if one of these helps.     Pertinent History  CVA, DM, HTN, obesity, chronic knee pain,     Limitations  Lifting;Standing;Walking    Patient Stated Goals  to walk better, stop falling,  get stronger in legs    Currently in Pain?  No/denies    Pain Score  0-No pain       [facility] Adult PT Treatment/Exercise - 02/23/18 1109      Transfers   Transfers  Sit to Stand;Stand to Sit    Sit to Stand  6: Modified independent (Device/Increase time);Without upper extremity assist;From chair/3-in-1    Stand to Sit  6: Modified independent (Device/Increase time);Without upper extremity assist;To chair/3-in-1      Ambulation/Gait   Ambulation/Gait  Yes    Ambulation/Gait Assistance  5: Supervision;4: Min guard    Ambulation/Gait Assistance Details  cues for increased hip/knee flexion for improved foot clearance/decreased shuffling of feet. one episode of toe scuffing on indoor surface during last 10 feet of gait.         Ambulation Distance (Feet)  500 Feet    Assistive device  None    Gait Pattern  Step-through pattern;Decreased step length - left;Decreased stance time - right;Decreased stride length;Decreased hip/knee flexion - right;Trunk flexed;Wide base of support    Ambulation Surface  Level;Indoor;Unlevel;Outdoor;Paved;Gravel;Grass      High Level Balance   High Level Balance Activities  Side stepping;Marching forwards;Marching backwards;Tandem walking tandem fwd/bwd    High  Level Balance Comments  on both red mats next to counter: 3 laps each with intermittent touch to counter for balance. min guard to min assist for balance with cues on posture and ex form/technique.       Knee/Hip Exercises: Standing   Forward Step Up  Both;1 set;10 reps;Hand Hold: 2;Step Height: 6";Limitations    Forward Step Up Limitations  light bil UE support, cues on ex form and slow, controlled lowering with each rep.           Balance Exercises - 02/23/18 1138      Balance Exercises: Standing   SLS with Vectors  Foam/compliant surface;Other reps (comment);Limitations    Balance Beam  standing across red foam beam: alternating fwd stepping to floor/back onto beam, then alternating bwd stepping to  floor/back onto beam, no UE support with min guard to min assist. cues on step length, step height and weight shifting onto stepping leg with this activity to assist with balance and stepping strategies.      Sit to Stand Time  with feet across red beam: 10 reps of sit<>stands with hands at sides (helping very little). cues for full upright standing and slow, controlled descent.       Balance Exercises: Standing   SLS with Vectors Limitations  standing on airex with 2 foam bubbles in front for safety: alternating fwd toe taps, then alternating cross toe taps                     PT Short Term Goals - 02/12/18 1123      PT SHORT TERM GOAL #1   Title  Patient is independent with initial HEP. (All STGs Target Date: 03/06/2018)    Time  4    Period  Weeks    Status  On-going    Target Date  03/06/18      PT SHORT TERM GOAL #2   Title  [name] Balance >/=47/56    Time  4    Period  Weeks    Status  On-going    Target Date  03/06/18      PT SHORT TERM GOAL #3   Title  Functional Gait Assessment >/= 17/30    Time  4    Period  Weeks    Status  On-going    Target Date  03/06/18        PT Long Term Goals - 02/12/18 1124      PT LONG TERM GOAL #1   Title  Patient demonstrates & verbalizes understanding of ongoing HEP / fitness plan. (All LTGs Target Date 04/02/2018)    Time  8    Period  Weeks    Status  On-going    Target Date  04/02/18      PT LONG TERM GOAL #2   Title  Berg Balance >/= 52/56 to indicate lower fall risk.     Time  8    Period  Weeks    Status  On-going    Target Date  04/02/18      PT LONG TERM GOAL #3   Title  Functional Gait Assessment >19/30 to indicate lower fall risk.     Time  8    Period  Weeks    Status  On-going    Target Date  04/02/18      PT LONG TERM GOAL #4   Title  Patient ambulates 1000' outdoors including grass, ramps, curbs & stairs (1 rail) modified independent.  Time  8    Period  Weeks    Status  On-going    Target Date   04/02/18          02/23/18 1108  Plan  Clinical Impression Statement Today's skilled session continued to focus on gait on various surfaces and balance reactions with emphasis on compliant surfaces with/with out vision removed. Pt is progressing toward goals and should benefit from continued PT to progress toward unmet goals.   Pt will benefit from skilled therapeutic intervention in order to improve on the following deficits Abnormal gait;Decreased activity tolerance;Decreased balance;Decreased endurance;Decreased mobility;Decreased range of motion;Decreased strength;Dizziness;Postural dysfunction;Pain  Rehab Potential Good  PT Frequency 2x / week  PT Duration 8 weeks  PT Treatment/Interventions ADLs/Self Care Home Management;Canalith Repostioning;DME Instruction;Gait training;Stair training;Functional mobility training;Therapeutic activities;Therapeutic exercise;Balance training;Neuromuscular re-education;Patient/family education;Vestibular  PT Next Visit Plan dynamic standing balance on compliant surface.  Dynamic gait, dual tasking with gait.   Consulted and Agree with Plan of Care Patient         Patient will benefit from skilled therapeutic intervention in order to improve the following deficits and impairments:  Abnormal gait, Decreased activity tolerance, Decreased balance, Decreased endurance, Decreased mobility, Decreased range of motion, Decreased strength, Dizziness, Postural dysfunction, Pain  Visit Diagnosis: Unsteadiness on feet  Other abnormalities of gait and mobility  Muscle weakness (generalized)     Problem List Patient Active Problem List   Diagnosis Date Noted  . Cerebral thrombosis with cerebral infarction 01/10/2018  . Hyperlipidemia   . Lower extremity weakness 01/09/2018  . Abnormality of gait   . Type 2 diabetes mellitus with complication, without long-term current use of insulin ([place]) 08/07/2015  . Bilateral knee pain 05/26/2012  . Varicose veins  of lower extremities with other complications 16/09/930  . Fibroid, uterus 04/18/2011  . Metrorrhagia 04/18/2011  . Hypertension 04/18/2011    Willow Ora, PTA, [facility] [address], [place], [place] [address] [phone] 02/25/18, 8:07 AM   Name: Lindsay Travis MRN: [id number] Date of Birth: 1961-02-26

## 2018-02-25 ENCOUNTER — Encounter: Payer: Self-pay | Admitting: Physical Therapy

## 2018-02-25 ENCOUNTER — Ambulatory Visit: Payer: BLUE CROSS/BLUE SHIELD | Admitting: Physical Therapy

## 2018-02-25 DIAGNOSIS — M25561 Pain in right knee: Secondary | ICD-10-CM

## 2018-02-25 DIAGNOSIS — R2681 Unsteadiness on feet: Secondary | ICD-10-CM

## 2018-02-25 DIAGNOSIS — M6281 Muscle weakness (generalized): Secondary | ICD-10-CM

## 2018-02-25 DIAGNOSIS — M25562 Pain in left knee: Secondary | ICD-10-CM

## 2018-02-25 DIAGNOSIS — G8929 Other chronic pain: Secondary | ICD-10-CM

## 2018-02-25 DIAGNOSIS — M25662 Stiffness of left knee, not elsewhere classified: Secondary | ICD-10-CM

## 2018-02-25 DIAGNOSIS — R2689 Other abnormalities of gait and mobility: Secondary | ICD-10-CM

## 2018-02-25 DIAGNOSIS — M25661 Stiffness of right knee, not elsewhere classified: Secondary | ICD-10-CM

## 2018-02-25 NOTE — Therapy (Signed)
[facility] [address] [place], [place], [address] Phone: [phone]   Fax:  [phone]  Physical Therapy Treatment  Patient Details  Name: Lindsay Travis MRN: [id number] Date of Birth: 1961-02-19 Referring Provider: Dorcas Mcmurray, MD   Encounter Date: 02/25/2018  PT End of Session - 02/25/18 1209    Visit Number  6    Number of Visits  17    Date for PT Re-Evaluation  04/02/18    Authorization Type  BCBS    PT Start Time  1108    PT Stop Time  1146    PT Time Calculation (min)  38 min    Equipment Utilized During Treatment  Gait belt    Activity Tolerance  Patient tolerated treatment well;Patient limited by fatigue;No increased pain    Behavior During Therapy  WFL for tasks assessed/performed       Past Medical History:  Diagnosis Date  . Allergy   . Anemia   . Clotting disorder ([place])   . Diabetes mellitus    recent high blood sugar and has RX but not taking  med -b/c she doesn't think she  is diabetic, just had lots of sugar in her diet when  dx'd.  Marland Kitchen Hypertension   . Shortness of breath    on exertion- has low hgb    Past Surgical History:  Procedure Laterality Date  . ABDOMINAL HYSTERECTOMY  09/11/2011   Procedure: HYSTERECTOMY ABDOMINAL;  Surgeon: Frederico Hamman, MD;  Location: [facility];  Service: Gynecology;  Laterality: N/A;  . NO PAST SURGERIES      There were no vitals filed for this visit.  Subjective Assessment - 02/25/18 1114    Subjective  Pt has PCP appoitnment next week; would like to discuss BP medication concerns.                       [facility] Adult PT Treatment/Exercise - 02/25/18 0001      Knee/Hip Exercises: Standing   Wall Squat  1 set;5 reps mini wall squats      Knee/Hip Exercises: Supine   Bridges  Strengthening;Both;1 set;5 sets      Knee/Hip Exercises: Sidelying   Clams  x10 each side          Balance Exercises - 02/25/18 1306      Balance  Exercises: Standing   Standing Eyes Opened  Narrow base of support (BOS);Head turns;Foam/compliant surface progressed with Upper trunk rotations        PT Education - 02/25/18 1304    Education Details  Importance of following up with PCP about BP medication concerns. BP manually checked 138/88.   Person(s) Educated  Patient    Methods  Explanation    Comprehension  Verbalized understanding       PT Short Term Goals - 02/12/18 1123      PT SHORT TERM GOAL #1   Title  Patient is independent with initial HEP. (All STGs Target Date: 03/06/2018)    Time  4    Period  Weeks    Status  On-going    Target Date  03/06/18      PT SHORT TERM GOAL #2   Title  [name] Balance >/=47/56    Time  4    Period  Weeks    Status  On-going    Target Date  03/06/18      PT SHORT TERM GOAL #3   Title  Functional Gait  Assessment >/= 17/30    Time  4    Period  Weeks    Status  On-going    Target Date  03/06/18        PT Long Term Goals - 02/12/18 1124      PT LONG TERM GOAL #1   Title  Patient demonstrates & verbalizes understanding of ongoing HEP / fitness plan. (All LTGs Target Date 04/02/2018)    Time  8    Period  Weeks    Status  On-going    Target Date  04/02/18      PT LONG TERM GOAL #2   Title  Berg Balance >/= 52/56 to indicate lower fall risk.     Time  8    Period  Weeks    Status  On-going    Target Date  04/02/18      PT LONG TERM GOAL #3   Title  Functional Gait Assessment >19/30 to indicate lower fall risk.     Time  8    Period  Weeks    Status  On-going    Target Date  04/02/18      PT LONG TERM GOAL #4   Title  Patient ambulates 1000' outdoors including grass, ramps, curbs & stairs (1 rail) modified independent.     Time  8    Period  Weeks    Status  On-going    Target Date  04/02/18            Plan - 02/25/18 1253    Clinical Impression Statement  Discussed BP concerns specifically about medication and encouraged pt to follow up with PCP.   Strength training for LE; pt was challenged with muscle fatigue but tolerated well except for wall (mini) squats which pt reported aggravated knees.  Pt able to perform standing balance with feet together on compliant surface with head turns without UE support but required min guard with upper trunk rotations.                      Rehab Potential  Good    PT Frequency  2x / week    PT Duration  8 weeks    PT Treatment/Interventions  ADLs/Self Care Home Management;Canalith Repostioning;DME Instruction;Gait training;Stair training;Functional mobility training;Therapeutic activities;Therapeutic exercise;Balance training;Neuromuscular re-education;Patient/family education;Vestibular    PT Next Visit Plan  dynamic standing balance on compliant surface.  Dynamic gait, dual tasking with gait.     Consulted and Agree with Plan of Care  Patient       Patient will benefit from skilled therapeutic intervention in order to improve the following deficits and impairments:  Abnormal gait, Decreased activity tolerance, Decreased balance, Decreased endurance, Decreased mobility, Decreased range of motion, Decreased strength, Dizziness, Postural dysfunction, Pain  Visit Diagnosis: Other abnormalities of gait and mobility  Unsteadiness on feet  Muscle weakness (generalized)  Stiffness of left knee, not elsewhere classified  Stiffness of right knee, not elsewhere classified  Chronic pain of right knee  Chronic pain of left knee     Problem List Patient Active Problem List   Diagnosis Date Noted  . Cerebral thrombosis with cerebral infarction 01/10/2018  . Hyperlipidemia   . Lower extremity weakness 01/09/2018  . Abnormality of gait   . Type 2 diabetes mellitus with complication, without long-term current use of insulin ([place]) 08/07/2015  . Bilateral knee pain 05/26/2012  . Varicose veins of lower extremities with other complications 63/78/5885  . Fibroid, uterus 04/18/2011  .  Metrorrhagia  04/18/2011  . Hypertension 04/18/2011    Bjorn Loser, PTA  02/25/18, 1:08 PM [facility] [address] [place], [place], [address] Phone: [phone]   Fax:  [phone]  Name: Lindsay Travis MRN: [id number] Date of Birth: 1961-08-28

## 2018-03-03 ENCOUNTER — Ambulatory Visit
Admission: RE | Admit: 2018-03-03 | Discharge: 2018-03-03 | Disposition: A | Discharge status: Home / Self Care | Payer: BLUE CROSS/BLUE SHIELD | Source: Ambulatory Visit | Attending: Urgent Care | Admitting: Urgent Care

## 2018-03-03 ENCOUNTER — Encounter: Payer: Self-pay | Admitting: Physical Therapy

## 2018-03-03 ENCOUNTER — Ambulatory Visit: Payer: BLUE CROSS/BLUE SHIELD | Admitting: Physical Therapy

## 2018-03-03 DIAGNOSIS — M25562 Pain in left knee: Secondary | ICD-10-CM | POA: Diagnosis not present

## 2018-03-03 DIAGNOSIS — M25662 Stiffness of left knee, not elsewhere classified: Secondary | ICD-10-CM | POA: Diagnosis not present

## 2018-03-03 DIAGNOSIS — R2689 Other abnormalities of gait and mobility: Secondary | ICD-10-CM

## 2018-03-03 DIAGNOSIS — M25661 Stiffness of right knee, not elsewhere classified: Secondary | ICD-10-CM

## 2018-03-03 DIAGNOSIS — Z1231 Encounter for screening mammogram for malignant neoplasm of breast: Secondary | ICD-10-CM | POA: Diagnosis not present

## 2018-03-03 DIAGNOSIS — M6281 Muscle weakness (generalized): Secondary | ICD-10-CM | POA: Diagnosis not present

## 2018-03-03 DIAGNOSIS — Z1239 Encounter for other screening for malignant neoplasm of breast: Secondary | ICD-10-CM

## 2018-03-03 DIAGNOSIS — R2681 Unsteadiness on feet: Secondary | ICD-10-CM | POA: Diagnosis not present

## 2018-03-03 DIAGNOSIS — G8929 Other chronic pain: Secondary | ICD-10-CM | POA: Diagnosis not present

## 2018-03-03 DIAGNOSIS — M25561 Pain in right knee: Secondary | ICD-10-CM | POA: Diagnosis not present

## 2018-03-03 NOTE — Patient Instructions (Signed)
Fitness Plan has 4 components.  1. Endurance - Recommend machines that are sitting with back support that uses both your arms and your legs.Or can do walking program Goal is 20-30 minutes. 2. Strength - weight machines enough weight to have resistance but not so much that you have to strain or cheat.  Goal is to do 15 repetitions for 2-3 sets. Rest 30-60 seconds between sets.  Do 2-4 leg machines, 2-4 arm machines & 2-4 trunk machines. Look for pictures to make sure you exercise both sides of arm, leg or trunk.  Leg machines like leg press (can do both legs or each leg by themselves),   At home can do theraband exercises for arms or legs, floor transfers, sit to stand to sit using arms as little as possible, Yoga positions 3.Flexibility - make sure you include arms, legs & trunk. Can do Yoga also 4. Balance- can work in corner with chair in front - head turns, arm motions, eyes closed; place one foot inside cabinet or on 4-6" block to work on one-legged stance,   Try to get to each component 3-5 times per week.  Going to [employer] or fitness center you want do things you can not do at home.  For example use bikes at [employer] if you don't have one at home. Then on days you don't go to [employer], do exercises at home.  At least 150 minutes a week of moderate intensity exercise. Try not to go more than 2 days in a row without being active for at least 30 minutes a day. Any activity where you are up and moving is good- Walking, bicycling, stationary bicycling, dancing.  (moderate intensity means to get  a little out of breath)   Do resistance exercise at least 2 times a week.  This can be yoga poses or strength training where you lift your own weight (think leg lifts or toe raises)  or light weights like cans of beans with your arms.    Flexibility and balance exercise- safe stretching and practicing balance is very important to health.  

## 2018-03-04 NOTE — Therapy (Signed)
[facility] [address] [place], [place], [address] Phone: [phone]   Fax:  [phone]  Physical Therapy Treatment  Patient Details  Name: Lindsay Travis MRN: [id number] Date of Birth: 06-10-1961 Referring Provider: Dorcas Mcmurray, MD   Encounter Date: 03/03/2018  PT End of Session - 03/03/18 1157    Visit Number  7    Number of Visits  17    Date for PT Re-Evaluation  04/02/18    Authorization Type  BC[employer]    PT Start Time  1100    PT Stop Time  1140    PT Time Calculation (min)  40 min    Equipment Utilized During Treatment  Gait belt    Activity Tolerance  Patient tolerated treatment well;Patient limited by fatigue;No increased pain    Behavior During Therapy  WFL for tasks assessed/performed       Past Medical History:  Diagnosis Date  . Allergy   . Anemia   . Clotting disorder ([place])   . Diabetes mellitus    recent high blood sugar and has RX but not taking  med -b/c she doesn't think she  is diabetic, just had lots of sugar in her diet when  dx'd.  [place] Kitchen Hypertension   . Shortness of breath    on exertion- has low hgb    Past Surgical History:  Procedure Laterality Date  . ABDOMINAL HYSTERECTOMY  09/11/2011   Procedure: HYSTERECTOMY ABDOMINAL;  Surgeon: Frederico Hamman, MD;  Location: [facility];  Service: Gynecology;  Laterality: N/A;  . NO PAST SURGERIES      There were no vitals filed for this visit.  Subjective Assessment - 03/03/18 1100    Subjective  She went to pool and walked in water. No falls.     Pertinent History  CVA, DM, HTN, obesity, chronic knee pain,     Limitations  Lifting;Standing;Walking    Patient Stated Goals  to walk better, stop falling, get stronger in legs    Currently in Pain?  No/denies         [facility] PT Assessment - 03/03/18 1100      Ambulation/Gait   Gait velocity  2.86 ft/sec Initial was 2.74 ft/sec      Berg Balance Test   Sit to Stand  Able to stand without  using hands and stabilize independently    Standing Unsupported  Able to stand safely 2 minutes    Sitting with Back Unsupported but Feet Supported on Floor or Stool  Able to sit safely and securely 2 minutes    Stand to Sit  Sits safely with minimal use of hands    Transfers  Able to transfer safely, minor use of hands    Standing Unsupported with Eyes Closed  Able to stand 10 seconds safely    Standing Ubsupported with Feet Together  Able to place feet together independently and stand 1 minute safely    From Standing, Reach Forward with Outstretched Arm  Can reach confidently >25 cm (10")    From Standing Position, Pick up Object from Floor  Able to pick up shoe safely and easily    From Standing Position, Turn to Look Behind Over each Shoulder  Looks behind from both sides and weight shifts well    Turn 360 Degrees  Able to turn 360 degrees safely in 4 seconds or less    Standing Unsupported, Alternately Place Feet on Step/Stool  Able to stand independently and safely and complete  8 steps in 20 seconds    Standing Unsupported, One Foot in [place] to place foot tandem independently and hold 30 seconds    Standing on One Leg  Able to lift leg independently and hold > 10 seconds    Total Score  56    Berg comment:  Initial Berg 43/56      Functional Gait  Assessment   Gait assessed   Yes    Gait Level Surface  Walks 20 ft in less than 7 sec but greater than 5.5 sec, uses assistive device, slower speed, mild gait deviations, or deviates 6-10 in outside of the 12 in walkway width.    Change in Gait Speed  Able to change speed, demonstrates mild gait deviations, deviates 6-10 in outside of the 12 in walkway width, or no gait deviations, unable to achieve a major change in velocity, or uses a change in velocity, or uses an assistive device.    Gait with Horizontal Head Turns  Performs head turns smoothly with slight change in gait velocity (eg, minor disruption to smooth gait path), deviates  6-10 in outside 12 in walkway width, or uses an assistive device.    Gait with Vertical Head Turns  Performs task with slight change in gait velocity (eg, minor disruption to smooth gait path), deviates 6 - 10 in outside 12 in walkway width or uses assistive device    Gait and Pivot Turn  Pivot turns safely in greater than 3 sec and stops with no loss of balance, or pivot turns safely within 3 sec and stops with mild imbalance, requires small steps to catch balance.    Step Over Obstacle  Is able to step over one shoe box (4.5 in total height) without changing gait speed. No evidence of imbalance.    Gait with Narrow Base of Support  Ambulates 7-9 steps.    Gait with Eyes Closed  Walks 20 ft, uses assistive device, slower speed, mild gait deviations, deviates 6-10 in outside 12 in walkway width. Ambulates 20 ft in less than 9 sec but greater than 7 sec.    Ambulating Backwards  Walks 20 ft, uses assistive device, slower speed, mild gait deviations, deviates 6-10 in outside 12 in walkway width.    Steps  Alternating feet, must use rail.    Total Score  20    FGA comment:  Initial FGA 14/30                           PT Education - 03/03/18 1159    Education Details  Well rounded fitness plan including exercises for endurance, strength, flexibility & balance. routine exercise helps weight management, back / leg /arthritic pain and reduces risk of some health problems    Person(s) Educated  Patient    Methods  Explanation;Verbal cues;Handout    Comprehension  Verbalized understanding;Verbal cues required;Need further instruction       PT Short Term Goals - 03/03/18 1157      PT SHORT TERM GOAL #1   Title  Patient is independent with initial HEP. (All STGs Target Date: 03/06/2018)    Baseline  MET 03/03/2018    Time  4    Period  Weeks    Status  Achieved      PT SHORT TERM GOAL #2   Title  Berg Balance >/=47/56    Baseline  MET 03/03/2018  [name] Balance 56/56    Time  4     Period  Weeks    Status  Achieved      PT SHORT TERM GOAL #3   Title  Functional Gait Assessment >/= 17/30    Baseline  MET 03/03/2018  FGA 20/30    Time  4    Period  Weeks    Status  Achieved        PT Long Term Goals - 03/03/18 1158      PT LONG TERM GOAL #1   Title  Patient demonstrates & verbalizes understanding of ongoing HEP / fitness plan. (All LTGs Target Date 04/02/2018)    Time  8    Period  Weeks    Status  On-going    Target Date  04/02/18      PT LONG TERM GOAL #2   Title  Berg Balance >/= 52/56 to indicate lower fall risk.     Baseline  MET 03/03/2018  [name] 56/56    Time  8    Period  Weeks    Status  Achieved      PT LONG TERM GOAL #3   Title  Functional Gait Assessment >/= 24/30 to indicate lower fall risk.     Time  8    Period  Weeks    Status  Revised    Target Date  04/02/18      PT LONG TERM GOAL #4   Title  Patient ambulates 1000' outdoors including grass, ramps, curbs & stairs (1 rail) modified independent.     Time  8    Period  Weeks    Status  On-going    Target Date  04/02/18            Plan - 03/03/18 1201    Clinical Impression Statement  Patient met all STGs set for first 30 days. She has improved Berg Balance from 43/56 to 56/56 and Functional Gait Assessment from 14/30 to 20/30. She can benefit from continuing PT per plan of care to further improve moblity & reduce fall risk.     Rehab Potential  Good    PT Frequency  2x / week    PT Duration  8 weeks    PT Treatment/Interventions  ADLs/Self Care Home Management;Canalith Repostioning;DME Instruction;Gait training;Stair training;Functional mobility training;Therapeutic activities;Therapeutic exercise;Balance training;Neuromuscular re-education;Patient/family education;Vestibular    PT Next Visit Plan  dynamic standing balance on compliant surface.  Dynamic gait, dual tasking with gait.     Consulted and Agree with Plan of Care  Patient       Patient will benefit from skilled  therapeutic intervention in order to improve the following deficits and impairments:  Abnormal gait, Decreased activity tolerance, Decreased balance, Decreased endurance, Decreased mobility, Decreased range of motion, Decreased strength, Dizziness, Postural dysfunction, Pain  Visit Diagnosis: Other abnormalities of gait and mobility  Unsteadiness on feet  Muscle weakness (generalized)  Stiffness of left knee, not elsewhere classified  Stiffness of right knee, not elsewhere classified     Problem List Patient Active Problem List   Diagnosis Date Noted  . Cerebral thrombosis with cerebral infarction 01/10/2018  . Hyperlipidemia   . Lower extremity weakness 01/09/2018  . Abnormality of gait   . Type 2 diabetes mellitus with complication, without long-term current use of insulin ([place]) 08/07/2015  . Bilateral knee pain 05/26/2012  . Varicose veins of lower extremities with other complications 83/66/2947  . Fibroid, uterus 04/18/2011  . Metrorrhagia 04/18/2011  . Hypertension 04/18/2011    Eithan Beagle  PT, DPT 03/04/2018, 7:03 AM  [facility] [address] [place], [place], [address] Phone: [phone]   Fax:  [phone]  Name: Lindsay Travis MRN: [id number] Date of Birth: [birth year]

## 2018-03-05 ENCOUNTER — Ambulatory Visit: Payer: BLUE CROSS/BLUE SHIELD | Admitting: Physical Therapy

## 2018-03-05 ENCOUNTER — Encounter: Payer: Self-pay | Admitting: Physical Therapy

## 2018-03-05 DIAGNOSIS — R2689 Other abnormalities of gait and mobility: Secondary | ICD-10-CM

## 2018-03-05 DIAGNOSIS — M6281 Muscle weakness (generalized): Secondary | ICD-10-CM | POA: Diagnosis not present

## 2018-03-05 DIAGNOSIS — M25561 Pain in right knee: Secondary | ICD-10-CM

## 2018-03-05 DIAGNOSIS — G8929 Other chronic pain: Secondary | ICD-10-CM | POA: Diagnosis not present

## 2018-03-05 DIAGNOSIS — M25562 Pain in left knee: Secondary | ICD-10-CM

## 2018-03-05 DIAGNOSIS — M25662 Stiffness of left knee, not elsewhere classified: Secondary | ICD-10-CM

## 2018-03-05 DIAGNOSIS — M25661 Stiffness of right knee, not elsewhere classified: Secondary | ICD-10-CM | POA: Diagnosis not present

## 2018-03-05 DIAGNOSIS — R2681 Unsteadiness on feet: Secondary | ICD-10-CM

## 2018-03-06 ENCOUNTER — Ambulatory Visit: Payer: BLUE CROSS/BLUE SHIELD | Admitting: Urgent Care

## 2018-03-06 ENCOUNTER — Encounter: Payer: Self-pay | Admitting: Urgent Care

## 2018-03-06 VITALS — BP 131/83 | HR 76 | Temp 98.1°F | Resp 17 | Ht 65.0 in | Wt 238.0 lb

## 2018-03-06 DIAGNOSIS — H04123 Dry eye syndrome of bilateral lacrimal glands: Secondary | ICD-10-CM

## 2018-03-06 DIAGNOSIS — R5383 Other fatigue: Secondary | ICD-10-CM

## 2018-03-06 DIAGNOSIS — H538 Other visual disturbances: Secondary | ICD-10-CM

## 2018-03-06 DIAGNOSIS — Z1211 Encounter for screening for malignant neoplasm of colon: Secondary | ICD-10-CM | POA: Diagnosis not present

## 2018-03-06 DIAGNOSIS — I10 Essential (primary) hypertension: Secondary | ICD-10-CM | POA: Diagnosis not present

## 2018-03-06 DIAGNOSIS — E119 Type 2 diabetes mellitus without complications: Secondary | ICD-10-CM

## 2018-03-06 NOTE — Patient Instructions (Addendum)
Please continue taking all your medications.  We will leave amlodipine at 5 mg.  Let us receive back in 3 months for recheck on your blood sugar and your hypertension.  We will contact you to see a eye doctor.  Keep in mind that setting up this appointment can take up to 2 weeks.   Preventing Colorectal Cancer Colorectal cancer is an abnormal growth of cells and tissue (tumor) in the colon or rectum, which are parts of the large intestine. If colorectal cancer is not found or prevented early, it can spread (metastasize) to other parts of the body and can be fatal. You are more likely to develop this condition if:  You are older than age 56.  You have a family history of colorectal cancer or genetic conditions, such as: ? Lynch syndrome. ? Familial adenomatous polyposis. ? Turcot syndrome. ? Peutz-Jeghers syndrome.  You have had cancer before.  You have multiple growths (polyps) in the colon or rectum.  You have certain other conditions, such as an inflammatory bowel disease or Crohn disease.  You are African American.  You have diabetes.  You have an inactive (sedentary) lifestyle.  You eat a diet that is high in red or processed meat and high in fat (especially animal fat).  You eat a diet that is low in sources of fiber, such as fruits, vegetables, and whole grains.  You drink alcohol excessively.  You smoke.  It is important to have colorectal cancer tests (screenings) to check for early signs of cancer. During a screening, polyps may be removed and examined to check for cancer cells and possibly to prevent them from becoming cancerous. In addition to regular screenings, making changes to your diet and lifestyle can help prevent colorectal cancer. What nutrition changes can be made?  Eat plenty of fruits and vegetables. You need 1?2 cups of fruit each day. You also need 2?3 cups of vegetables each day.  Eat whole grains, which are grains that have not been processed. They  include oats, whole wheat, bulgur, brown rice, quinoa, and millet. You should eat 6?8 oz (171-227 g) of grains each day. Use a kitchen scale to measure these amounts.  Eat less red meat. Instead, choose low-fat (lean) sources of protein such as beans, tofu, fish, and chicken.  Avoid processed meat, such as deli meat, bacon, and sausage. Avoid frying and cooking meat at high heat. Use other methods of cooking, such as steaming or sauteing. What lifestyle changes can be made?  Do not use any products that contain nicotine or tobacco, such as cigarettes and e-cigarettes. If you need help quitting, ask your health care provider.  Limit alcohol intake to no more than 1 drink a day for nonpregnant women and 2 drinks a day for men. One drink equals 12 oz of beer, 5 oz of wine, or 1 oz of hard liquor.  If you are overweight or obese, work with your health care provider to lose weight.  Exercise. Aim for at least 150 minutes of moderate exercise (like walking, biking, or yoga) or 75 minutes of vigorous exercise (like running or swimming) each week. Ask your health care provider how much physical activity is best for you.  Have regular screenings as often as recommended by your health care provider. ? Most people with average risk of colorectal cancer should start screening at age 56. If you have a personal or family history of colorectal cancer, you may need to start screening at a younger age. ?  There are several types of screening tests. They include: ? Guaiac-based fecal occult blood testing. ? Fecal immunochemical test (FIT). ? Stool DNA test. ? Barium enema. ? Virtual colonoscopy. ? Sigmoidoscopy. During this test, a flexible tube with a tiny camera (sigmoidoscope) is used to examine your rectum and lower colon. The sigmoidoscope is inserted through your anus into your rectum and lower colon. ? Colonoscopy. During this test, a long, thin, flexible tube with a tiny camera (colonoscope) is used  to examine your entire colon and rectum.  Ask your health care provider if you should take baby aspirin to help prevent polyps.  Do not ignore symptoms. Schedule a visit with your health care provider if you experience: ? Blood in your stool. ? Discomfort, pain, bloating, fullness, or cramps in your abdomen. ? A change in your bowel habits. ? Unexplained weight loss. ? Anemia. ? Nausea and vomiting. ? Constant fatigue.  Find out about your family's medical history. It is important to know whether colorectal cancer is in your family. If you feel that you have a strong family history, ask to meet with a Dietitian. Why are these changes important? Colorectal cancer is a leading cause of death from cancer. Making these changes can help you reduce your risk. Getting recommended screenings means that signs of cancer can be found and treated early. What can happen if changes are not made? If you do not make changes, you may have an increased risk of developing colorectal cancer. If you delay recommended screenings, it is possible that any existing cancer will grow and spread before it is found. Cancer that is larger or has spread is more difficult to treat and cure. Where to find support: To get support for preventing colorectal cancer, consider:  Talking with your health care provider. Ask about screenings and support groups.  Where to find more information: Learn more about colorectal cancer from:  The American Cancer Society: www.cancer.org  The [employer]: www.cancer.gov  Summary  You can reduce your chances of getting colorectal cancer by getting recommended screenings and making certain nutritional and lifestyle changes.  Eat plenty of fruits, vegetables and whole grains. Avoid red meats and processed meats.  Do not use any products that contain nicotine or tobacco, such as cigarettes and e-cigarettes. If you need help quitting, ask your health care  provider.  Aim for at least 150 minutes of moderate exercise or 75 minutes of vigorous exercise each week. Ask your health care provider how much activity is best for you.  Get regular screenings as often as recommended by your health care provider. Most people should have a colonoscopy at age 46. This information is not intended to replace advice given to you by your health care provider. Make sure you discuss any questions you have with your health care provider. Document Released: 08/09/2016 Document Revised: 08/09/2016 Document Reviewed: 08/09/2016 Elsevier Interactive Patient Education  2018 [employer].     Hypertension Hypertension, commonly called high blood pressure, is when the force of blood pumping through the arteries is too strong. The arteries are the blood vessels that carry blood from the heart throughout the body. Hypertension forces the heart to work harder to pump blood and may cause arteries to become narrow or stiff. Having untreated or uncontrolled hypertension can cause heart attacks, strokes, kidney disease, and other problems. A blood pressure reading consists of a higher number over a lower number. Ideally, your blood pressure should be below 120/80. The first ("top") number  is called the systolic pressure. It is a measure of the pressure in your arteries as your heart beats. The second ("bottom") number is called the diastolic pressure. It is a measure of the pressure in your arteries as the heart relaxes. What are the causes? The cause of this condition is not known. What increases the risk? Some risk factors for high blood pressure are under your control. Others are not. Factors you can change  Smoking.  Having type 2 diabetes mellitus, high cholesterol, or both.  Not getting enough exercise or physical activity.  Being overweight.  Having too much fat, sugar, calories, or salt (sodium) in your diet.  Drinking too much alcohol. Factors that are  difficult or impossible to change  Having chronic kidney disease.  Having a family history of high blood pressure.  Age. Risk increases with age.  Race. You may be at higher risk if you are African-American.  Gender. Men are at higher risk than women before age 58. After age 10, women are at higher risk than men.  Having obstructive sleep apnea.  Stress. What are the signs or symptoms? Extremely high blood pressure (hypertensive crisis) may cause:  Headache.  Anxiety.  Shortness of breath.  Nosebleed.  Nausea and vomiting.  Severe chest pain.  Jerky movements you cannot control (seizures).  How is this diagnosed? This condition is diagnosed by measuring your blood pressure while you are seated, with your arm resting on a surface. The cuff of the blood pressure monitor will be placed directly against the skin of your upper arm at the level of your heart. It should be measured at least twice using the same arm. Certain conditions can cause a difference in blood pressure between your right and left arms. Certain factors can cause blood pressure readings to be lower or higher than normal (elevated) for a short period of time:  When your blood pressure is higher when you are in a health care provider's office than when you are at home, this is called white coat hypertension. Most people with this condition do not need medicines.  When your blood pressure is higher at home than when you are in a health care provider's office, this is called masked hypertension. Most people with this condition may need medicines to control blood pressure.  If you have a high blood pressure reading during one visit or you have normal blood pressure with other risk factors:  You may be asked to return on a different day to have your blood pressure checked again.  You may be asked to monitor your blood pressure at home for 1 week or longer.  If you are diagnosed with hypertension, you may have  other blood or imaging tests to help your health care provider understand your overall risk for other conditions. How is this treated? This condition is treated by making healthy lifestyle changes, such as eating healthy foods, exercising more, and reducing your alcohol intake. Your health care provider may prescribe medicine if lifestyle changes are not enough to get your blood pressure under control, and if:  Your systolic blood pressure is above 130.  Your diastolic blood pressure is above 80.  Your personal target blood pressure may vary depending on your medical conditions, your age, and other factors. Follow these instructions at home: Eating and drinking  Eat a diet that is high in fiber and potassium, and low in sodium, added sugar, and fat. An example eating plan is called the DASH (Dietary Approaches to  Stop Hypertension) diet. To eat this way: ? Eat plenty of fresh fruits and vegetables. Try to fill half of your plate at each meal with fruits and vegetables. ? Eat whole grains, such as whole wheat pasta, brown rice, or whole grain bread. Fill about one quarter of your plate with whole grains. ? Eat or drink low-fat dairy products, such as skim milk or low-fat yogurt. ? Avoid fatty cuts of meat, processed or cured meats, and poultry with skin. Fill about one quarter of your plate with lean proteins, such as fish, chicken without skin, beans, eggs, and tofu. ? Avoid premade and processed foods. These tend to be higher in sodium, added sugar, and fat.  Reduce your daily sodium intake. Most people with hypertension should eat less than 1,500 mg of sodium a day.  Limit alcohol intake to no more than 1 drink a day for nonpregnant women and 2 drinks a day for men. One drink equals 12 oz of beer, 5 oz of wine, or 1 oz of hard liquor. Lifestyle  Work with your health care provider to maintain a healthy body weight or to lose weight. Ask what an ideal weight is for you.  Get at least 30  minutes of exercise that causes your heart to beat faster (aerobic exercise) most days of the week. Activities may include walking, swimming, or biking.  Include exercise to strengthen your muscles (resistance exercise), such as pilates or lifting weights, as part of your weekly exercise routine. Try to do these types of exercises for 30 minutes at least 3 days a week.  Do not use any products that contain nicotine or tobacco, such as cigarettes and e-cigarettes. If you need help quitting, ask your health care provider.  Monitor your blood pressure at home as told by your health care provider.  Keep all follow-up visits as told by your health care provider. This is important. Medicines  Take over-the-counter and prescription medicines only as told by your health care provider. Follow directions carefully. Blood pressure medicines must be taken as prescribed.  Do not skip doses of blood pressure medicine. Doing this puts you at risk for problems and can make the medicine less effective.  Ask your health care provider about side effects or reactions to medicines that you should watch for. Contact a health care provider if:  You think you are having a reaction to a medicine you are taking.  You have headaches that keep coming back (recurring).  You feel dizzy.  You have swelling in your ankles.  You have trouble with your vision. Get help right away if:  You develop a severe headache or confusion.  You have unusual weakness or numbness.  You feel faint.  You have severe pain in your chest or abdomen.  You vomit repeatedly.  You have trouble breathing. Summary  Hypertension is when the force of blood pumping through your arteries is too strong. If this condition is not controlled, it may put you at risk for serious complications.  Your personal target blood pressure may vary depending on your medical conditions, your age, and other factors. For most people, a normal blood  pressure is less than 120/80.  Hypertension is treated with lifestyle changes, medicines, or a combination of both. Lifestyle changes include weight loss, eating a healthy, low-sodium diet, exercising more, and limiting alcohol. This information is not intended to replace advice given to you by your health care provider. Make sure you discuss any questions you have with your  health care provider. Document Released: 08/26/2005 Document Revised: 07/24/2016 Document Reviewed: 07/24/2016 Elsevier Interactive Patient Education  2018 [employer].     IF you received an x-ray today, you will receive an invoice from [facility]. Please contact [facility] at [phone] with questions or concerns regarding your invoice.   IF you received labwork today, you will receive an invoice from [facility]. Please contact [facility] at [phone] with questions or concerns regarding your invoice.   Our billing staff will not be able to assist you with questions regarding bills from these companies.  You will be contacted with the lab results as soon as they are available. The fastest way to get your results is to activate your My Chart account. Instructions are located on the last page of this paperwork. If you have not heard from [place] regarding the results in 2 weeks, please contact this office.

## 2018-03-06 NOTE — Progress Notes (Signed)
   MRN: [id number]  Subjective:   [name] is a 57 y.o. female presenting for recheck on her fatigue and high blood pressure.  Patient reports that she is doing better.  She ended up sticking with 5 mg of amlodipine instead of half tablet of the 5 mg.  Today she is requesting an office visit for recheck on her eyes.  I will placed a referral for patient.  Counseled on need for health maintenance which includes Cologuard for colon cancer screening.  Patient is continuing follow-up with neurology.  I discussed need for Pap smear with patient, she stated that she was unsure about whether or not she still had her cervix, reports that she had a hysterectomy for fibroids but wanted to make sure.  She does not have a gynecologist.  Lindsay Travis has a current medication list which includes the following prescription(s): amlodipine, aspirin, atorvastatin, blood glucose meter kit and supplies, clopidogrel, glipizide, and hydrochlorothiazide. She is allergic to lisinopril; losartan; and aspirin. Lindsay Travis  has a past medical history of Allergy, Anemia, Clotting disorder ([place]), Diabetes mellitus, Hypertension, and Shortness of breath. Also  has a past surgical history that includes Abdominal hysterectomy (09/11/2011). His family history includes Hypertension in her other.  Objective:   Vitals: BP 131/83   Pulse 76   Temp 98.1 F (36.7 C) (Oral)   Resp 17   Ht '5\' 5"'$  (1.651 m)   Wt 238 lb (108 kg)   LMP 09/10/2011   SpO2 98%   BMI 39.61 kg/m   Physical Exam  Constitutional: She is oriented to person, place, and time. She appears well-developed and well-nourished.  Cardiovascular: Normal rate.  Pulmonary/Chest: Effort normal.  Genitourinary: No erythema, tenderness or bleeding in the vagina. No foreign body in the vagina. No signs of injury around the vagina. No vaginal discharge found.  Genitourinary Comments: Patient had a total abdominal hysterectomy.  Neurological: She is alert and oriented to  person, place, and time.  Psychiatric: She has a normal mood and affect.   Assessment and Plan :   Essential hypertension  Other fatigue  Morbid obesity ([place])  Screen for colon cancer - Plan: Cologuard  Will maintain her blood pressure medication at 5 mg of amlodipine.  Counseled patient that she does not have a cervix and no longer needs Pap smears that she has had a total hysterectomy.  Patient is agreeable to using Cologuard for screening for colon cancer.  Follow-up in 3 months for recheck on her right blood pressure and diabetes.  Lindsay Eagles, PA-C [facility] [phone] 03/06/2018  10:44 AM

## 2018-03-06 NOTE — Therapy (Signed)
[facility] [address] [place], [place], [address] Phone: [phone]   Fax:  [phone]  Physical Therapy Treatment  Patient Details  Name: Lindsay Travis MRN: [id number] Date of Birth: [birth year] Referring Provider: Dorcas Mcmurray, MD   Encounter Date: 03/05/2018  PT End of Session - 03/05/18 1202    Visit Number  8    Number of Visits  17    Date for PT Re-Evaluation  04/02/18    Authorization Type  BCBS    PT Start Time  1105    PT Stop Time  1145    PT Time Calculation (min)  40 min    Equipment Utilized During Treatment  Gait belt    Activity Tolerance  Patient tolerated treatment well;Patient limited by fatigue;No increased pain    Behavior During Therapy  WFL for tasks assessed/performed       Past Medical History:  Diagnosis Date  . Allergy   . Anemia   . Clotting disorder ([place])   . Diabetes mellitus    recent high blood sugar and has RX but not taking  med -b/c she doesn't think she  is diabetic, just had lots of sugar in her diet when  dx'd.  Marland Kitchen Hypertension   . Shortness of breath    on exertion- has low hgb    Past Surgical History:  Procedure Laterality Date  . ABDOMINAL HYSTERECTOMY  09/11/2011   Procedure: HYSTERECTOMY ABDOMINAL;  Surgeon: Frederico Hamman, MD;  Location: [facility];  Service: Gynecology;  Laterality: N/A;    There were no vitals filed for this visit.                  03/05/18 1100  High Level Balance  High Level Balance Activities Side stepping;Braiding;Backward walking;Turns;Tandem walking;Marching forwards;Marching backwards;Head turns;Other (comment) (increasing pace)  High Level Balance Comments tactile cues for balance                03/05/18 1100  Balance Exercises: Standing  Stepping Strategy Anterior;Posterior;Lateral;10 reps;Foam/compliant surface (ramp alternate stepping forward & backward & right/left)  Rockerboard  Anterior/posterior;Lateral;Head turns;EO;10 reps (weight shift stabilization & recovery. )  Step Ups Forward;Lateral;4 inch;UE support 1     PT Short Term Goals - 03/03/18 1157      PT SHORT TERM GOAL #1   Title  Patient is independent with initial HEP. (All STGs Target Date: 03/06/2018)    Baseline  MET 03/03/2018    Time  4    Period  Weeks    Status  Achieved      PT SHORT TERM GOAL #2   Title  Berg Balance >/=47/56    Baseline  MET 03/03/2018  Berg Balance 56/56    Time  4    Period  Weeks    Status  Achieved      PT SHORT TERM GOAL #3   Title  Functional Gait Assessment >/= 17/30    Baseline  MET 03/03/2018  FGA 20/30    Time  4    Period  Weeks    Status  Achieved        PT Long Term Goals - 03/03/18 1158      PT LONG TERM GOAL #1   Title  Patient demonstrates & verbalizes understanding of ongoing HEP / fitness plan. (All LTGs Target Date 04/02/2018)    Time  8    Period  Weeks    Status  On-going    Target Date  04/02/18      PT LONG TERM GOAL #2   Title  Berg Balance >/= 52/56 to indicate lower fall risk.     Baseline  MET 03/03/2018  [name] 56/56    Time  8    Period  Weeks    Status  Achieved      PT LONG TERM GOAL #3   Title  Functional Gait Assessment >/= 24/30 to indicate lower fall risk.     Time  8    Period  Weeks    Status  Revised    Target Date  04/02/18      PT LONG TERM GOAL #4   Title  Patient ambulates 1000' outdoors including grass, ramps, curbs & stairs (1 rail) modified independent.     Time  8    Period  Weeks    Status  On-going    Target Date  04/02/18            Plan - 03/05/18 1951    Clinical Impression Statement  Today's skilled session focused on balance activities facilitating ankle, hip & step strategies and increasing activity tolerance.     Rehab Potential  Good    PT Frequency  2x / week    PT Duration  8 weeks    PT Treatment/Interventions  ADLs/Self Care Home Management;Canalith Repostioning;DME  Instruction;Gait training;Stair training;Functional mobility training;Therapeutic activities;Therapeutic exercise;Balance training;Neuromuscular re-education;Patient/family education;Vestibular    PT Next Visit Plan  dynamic standing balance on compliant surface.  Dynamic gait, dual tasking with gait.     Consulted and Agree with Plan of Care  Patient       Patient will benefit from skilled therapeutic intervention in order to improve the following deficits and impairments:  Abnormal gait, Decreased activity tolerance, Decreased balance, Decreased endurance, Decreased mobility, Decreased range of motion, Decreased strength, Dizziness, Postural dysfunction, Pain  Visit Diagnosis: Other abnormalities of gait and mobility  Unsteadiness on feet  Muscle weakness (generalized)  Stiffness of left knee, not elsewhere classified  Stiffness of right knee, not elsewhere classified  Chronic pain of right knee  Chronic pain of left knee     Problem List Patient Active Problem List   Diagnosis Date Noted  . Cerebral thrombosis with cerebral infarction 01/10/2018  . Hyperlipidemia   . Lower extremity weakness 01/09/2018  . Abnormality of gait   . Type 2 diabetes mellitus with complication, without long-term current use of insulin ([place]) 08/07/2015  . Bilateral knee pain 05/26/2012  . Varicose veins of lower extremities with other complications 32/10/3341  . Fibroid, uterus 04/18/2011  . Metrorrhagia 04/18/2011  . Hypertension 04/18/2011    Charae Depaolis PT, DPT 03/06/2018, 7:52 PM  [facility] [address] [place], [place], [address] Phone: [phone]   Fax:  [phone]  Name: Lindsay Travis MRN: [id number] Date of Birth: 1961-07-20

## 2018-03-09 ENCOUNTER — Ambulatory Visit: Payer: BLUE CROSS/BLUE SHIELD | Attending: Family Medicine | Admitting: Physical Therapy

## 2018-03-09 ENCOUNTER — Encounter: Payer: Self-pay | Admitting: Physical Therapy

## 2018-03-09 DIAGNOSIS — M6281 Muscle weakness (generalized): Secondary | ICD-10-CM | POA: Diagnosis not present

## 2018-03-09 DIAGNOSIS — R2681 Unsteadiness on feet: Secondary | ICD-10-CM | POA: Insufficient documentation

## 2018-03-09 DIAGNOSIS — R2689 Other abnormalities of gait and mobility: Secondary | ICD-10-CM | POA: Insufficient documentation

## 2018-03-09 DIAGNOSIS — M25662 Stiffness of left knee, not elsewhere classified: Secondary | ICD-10-CM | POA: Insufficient documentation

## 2018-03-09 NOTE — Therapy (Signed)
[facility] [address] [place], [place], [address] Phone: [phone]   Fax:  [phone]  Physical Therapy Treatment  Patient Details  Name: Lindsay Travis MRN: [id number] Date of Birth: 01-25-1961 Referring Provider: Dorcas Mcmurray, MD   Encounter Date: 03/09/2018  PT End of Session - 03/09/18 1221    Visit Number  9    Number of Visits  17    Date for PT Re-Evaluation  04/02/18    Authorization Type  BCBS    Authorization Time Period  50% co-insurance,  30 visit limit combined PT, OT, chiropractor    Authorization - Visit Number  9    Authorization - Number of Visits  30    PT Start Time  1105    PT Stop Time  1145    PT Time Calculation (min)  40 min    Equipment Utilized During Treatment  Gait belt    Activity Tolerance  Patient tolerated treatment well;Patient limited by fatigue;No increased pain    Behavior During Therapy  WFL for tasks assessed/performed       Past Medical History:  Diagnosis Date  . Allergy   . Anemia   . Clotting disorder ([place])   . Diabetes mellitus    recent high blood sugar and has RX but not taking  med -b/c she doesn't think she  is diabetic, just had lots of sugar in her diet when  dx'd.  Marland Kitchen Hypertension   . Shortness of breath    on exertion- has low hgb    Past Surgical History:  Procedure Laterality Date  . ABDOMINAL HYSTERECTOMY  09/11/2011   Procedure: HYSTERECTOMY ABDOMINAL;  Surgeon: Lindsay Hamman, MD;  Location: [facility];  Service: Gynecology;  Laterality: N/A;    There were no vitals filed for this visit.  Subjective Assessment - 03/09/18 1105    Subjective  No falls. No problems. She was tired both & weak Saturday & Sunday. She went to church which lasts 1.5 hrs.     Pertinent History  CVA, DM, HTN, obesity, chronic knee pain,     Limitations  Lifting;Standing;Walking    Patient Stated Goals  to walk better, stop falling, get stronger in legs    Currently in  Pain?  No/denies                       [facility] Adult PT Treatment/Exercise - 03/09/18 1105      Ambulation/Gait   Ambulation/Gait  Yes    Ambulation/Gait Assistance  4: Min guard;5: Supervision    Ambulation/Gait Assistance Details  working on multi-tasking while ambulating: tossing ball right to left to right hand and carrying conversation including processing recipe.     Ambulation Distance (Feet)  1000 Feet    Assistive device  None    Ambulation Surface  Indoor;Level      Knee/Hip Exercises: Stretches   Active Hamstring Stretch  Right;Left;3 reps;10 seconds seated with knee straight & flexing trunk    Gastroc Stretch  Right;Left;3 reps;10 seconds seated with knee extension with strap          Balance Exercises - 03/09/18 1105      Balance Exercises: Standing   Stepping Strategy  Anterior;Posterior;Lateral;10 reps;Foam/compliant surface ramp alternate stepping forward & backward & right/left    Rockerboard  Anterior/posterior;Lateral;Head turns;EO;10 reps weight shift stabilization & recovery.           PT Short Term Goals - 03/03/18 1157  PT SHORT TERM GOAL #1   Title  Patient is independent with initial HEP. (All STGs Target Date: 03/06/2018)    Baseline  MET 03/03/2018    Time  4    Period  Weeks    Status  Achieved      PT SHORT TERM GOAL #2   Title  Berg Balance >/=47/56    Baseline  MET 03/03/2018  Berg Balance 56/56    Time  4    Period  Weeks    Status  Achieved      PT SHORT TERM GOAL #3   Title  Functional Gait Assessment >/= 17/30    Baseline  MET 03/03/2018  FGA 20/30    Time  4    Period  Weeks    Status  Achieved        PT Long Term Goals - 03/03/18 1158      PT LONG TERM GOAL #1   Title  Patient demonstrates & verbalizes understanding of ongoing HEP / fitness plan. (All LTGs Target Date 04/02/2018)    Time  8    Period  Weeks    Status  On-going    Target Date  04/02/18      PT LONG TERM GOAL #2   Title  Berg Balance  >/= 52/56 to indicate lower fall risk.     Baseline  MET 03/03/2018  Lindsay Travis 56/56    Time  8    Period  Weeks    Status  Achieved      PT LONG TERM GOAL #3   Title  Functional Gait Assessment >/= 24/30 to indicate lower fall risk.     Time  8    Period  Weeks    Status  Revised    Target Date  04/02/18      PT LONG TERM GOAL #4   Title  Patient ambulates 1000' outdoors including grass, ramps, curbs & stairs (1 rail) modified independent.     Time  8    Period  Weeks    Status  On-going    Target Date  04/02/18            Plan - 03/09/18 1223    Clinical Impression Statement  Today's skilled session focused on multi-tasking while ambulating and balance activities to facilitate hip & step strategies. Patient is improving in balance & gait but continues to report excessive fatigue.     Rehab Potential  Good    PT Frequency  2x / week    PT Duration  8 weeks    PT Treatment/Interventions  ADLs/Self Care Home Management;Canalith Repostioning;DME Instruction;Gait training;Stair training;Functional mobility training;Therapeutic activities;Therapeutic exercise;Balance training;Neuromuscular re-education;Patient/family education;Vestibular    PT Next Visit Plan  dynamic standing balance on compliant surface.  Dynamic gait, dual tasking with gait.     Consulted and Agree with Plan of Care  Patient       Patient will benefit from skilled therapeutic intervention in order to improve the following deficits and impairments:  Abnormal gait, Decreased activity tolerance, Decreased balance, Decreased endurance, Decreased mobility, Decreased range of motion, Decreased strength, Dizziness, Postural dysfunction, Pain  Visit Diagnosis: Other abnormalities of gait and mobility  Unsteadiness on feet  Muscle weakness (generalized)  Stiffness of left knee, not elsewhere classified     Problem List Patient Active Problem List   Diagnosis Date Noted  . Cerebral thrombosis with cerebral  infarction 01/10/2018  . Hyperlipidemia   . Lower extremity weakness 01/09/2018  .  Abnormality of gait   . Type 2 diabetes mellitus with complication, without long-term current use of insulin ([place]) 08/07/2015  . Bilateral knee pain 05/26/2012  . Varicose veins of lower extremities with other complications 54/88/4573  . Fibroid, uterus 04/18/2011  . Metrorrhagia 04/18/2011  . Hypertension 04/18/2011    Adlai Sinning PT, DPT 03/09/2018, 12:26 PM  [facility] [address] [place], [place], [address] Phone: [phone]   Fax:  [phone]  Name: Lindsay Travis MRN: [id number] Date of Birth: 1960-12-18

## 2018-03-09 NOTE — Progress Notes (Deleted)
[facility] [address] [place]. [place] [address] [phone]       OFFICE FOLLOW UP NOTE  [name] Date of Birth:  04/23/1961 Medical Record Number:  [id number]   Reason for Referral:  hospital stroke follow up  CHIEF COMPLAINT:  No chief complaint on file.   HPI: Lindsay Travis is being seen today for initial visit in the office for right BG/CR infarct on 01/09/2018. History obtained from patient and chart review. Reviewed all radiology images and labs personally.  [name] is a 57 y.o. female with history of DM and HTN who was admitted for imbalanced gait. No tPA given due to OSW.    MRI head reviewed and showed right BG/CR infarct.  CTA head and neck showed left M1 and A1 moderate stenosis.  2D echo showed an EF of 60 to 65% and negative for PFO.  LDL 130 and his patient was on statin PTA is recommended Lipitor 40 mg daily.  A1c elevated at 8.0 and recommended for tight glycemic control with close PCP follow-up.  No antithrombotic prior to admission and recommended DAPT for 3 weeks and then aspirin alone.  Therapy recommended outpatient PT and discharged home in stable condition.       ROS:   14 system review of systems performed and negative with exception of ***  PMH:  Past Medical History:  Diagnosis Date  . Allergy   . Anemia   . Clotting disorder ([place])   . Diabetes mellitus    recent high blood sugar and has RX but not taking  med -b/c she doesn't think she  is diabetic, just had lots of sugar in her diet when  dx'd.  Marland Kitchen Hypertension   . Shortness of breath    on exertion- has low hgb    PSH:  Past Surgical History:  Procedure Laterality Date  . ABDOMINAL HYSTERECTOMY  09/11/2011   Procedure: HYSTERECTOMY ABDOMINAL;  Surgeon: Frederico Hamman, MD;  Location: [facility];  Service: Gynecology;  Laterality: N/A;    Social History:  Social History   Socioeconomic History  . Marital status: Divorced    Spouse name: Not on  file  . Number of children: Not on file  . Years of education: Not on file  . Highest education level: Not on file  Occupational History  . Not on file  Social Needs  . Financial resource strain: Not on file  . Food insecurity:    Worry: Not on file    Inability: Not on file  . Transportation needs:    Medical: Not on file    Non-medical: Not on file  Tobacco Use  . Smoking status: Never Smoker  . Smokeless tobacco: Never Used  Substance and Sexual Activity  . Alcohol use: No  . Drug use: No  . Sexual activity: Never  Lifestyle  . Physical activity:    Days per week: Not on file    Minutes per session: Not on file  . Stress: Not on file  Relationships  . Social connections:    Talks on phone: Not on file    Gets together: Not on file    Attends religious service: Not on file    Active member of club or organization: Not on file    Attends meetings of clubs or organizations: Not on file    Relationship status: Not on file  . Intimate partner violence:    Fear of current or ex partner: Not on file  Emotionally abused: Not on file    Physically abused: Not on file    Forced sexual activity: Not on file  Other Topics Concern  . Not on file  Social History Narrative  . Not on file    Family History:  Family History  Problem Relation Age of Onset  . Hypertension Other     Medications:   Current Outpatient Medications on File Prior to Visit  Medication Sig Dispense Refill  . amLODipine (NORVASC) 5 MG tablet Take 1 tablet (5 mg total) by mouth daily. 90 tablet 1  . aspirin EC 81 MG EC tablet Take 1 tablet (81 mg total) by mouth daily. 30 tablet 1  . atorvastatin (LIPITOR) 40 MG tablet Take 1 tablet (40 mg total) by mouth daily at 6 PM. 90 tablet 1  . blood glucose meter kit and supplies KIT Please check fasting blood sugar once daily. 1 each 0  . clopidogrel (PLAVIX) 75 MG tablet Take 1 tablet (75 mg total) by mouth daily. 90 tablet 3  . glipiZIDE (GLUCOTROL) 5 MG  tablet Take 1 tablet (5 mg total) by mouth 2 (two) times daily before a meal. 60 tablet 5  . hydrochlorothiazide (HYDRODIURIL) 25 MG tablet Take 1 tablet (25 mg total) by mouth daily. 90 tablet 1   No current facility-administered medications on file prior to visit.     Allergies:   Allergies  Allergen Reactions  . Lisinopril Other (See Comments)    Did not feel good  . Losartan Other (See Comments)    headache  . Aspirin Other (See Comments)    Abdominal pain     Physical Exam  There were no vitals filed for this visit. There is no height or weight on file to calculate BMI. No exam data present  General: well developed, well nourished, seated, in no evident distress Head: head normocephalic and atraumatic.   Neck: supple with no carotid or supraclavicular bruits Cardiovascular: regular rate and rhythm, no murmurs Musculoskeletal: no deformity Skin:  no rash/petichiae Vascular:  Normal pulses all extremities  Neurologic Exam Mental Status: Awake and fully alert. Oriented to place and time. Recent and remote memory intact. Attention span, concentration and fund of knowledge appropriate. Mood and affect appropriate.  No flowsheet data found. Cranial Nerves: Fundoscopic exam reveals sharp disc margins. Pupils equal, briskly reactive to light. Extraocular movements full without nystagmus. Visual fields full to confrontation. Hearing intact. Facial sensation intact. Face, tongue, palate moves normally and symmetrically.  Motor: Normal bulk and tone. Normal strength in all tested extremity muscles. Sensory.: intact to touch , pinprick , position and vibratory sensation.  Coordination: Rapid alternating movements normal in all extremities. Finger-to-nose and heel-to-shin performed accurately bilaterally. Gait and Station: Arises from chair without difficulty. Stance is normal. Gait demonstrates normal stride length and balance . Able to heel, toe and tandem walk without difficulty.    Reflexes: 1+ and symmetric. Toes downgoing.    NIHSS  *** Modified Rankin  ***   Diagnostic Data (Labs, Imaging, Testing)  MR BRAIN WO CONTRAST 01/09/2018 MPRESSION: MRI HEAD IMPRESSION 1. Two separate areas of restricted diffusion involving the right basal ganglia/corona radiata as above, favored to reflect acute ischemic vascular infarcts. Demyelinating disease could conceivably have this appearance as well, and could be considered in the correct clinical setting. 2. Additional mild nonspecific cerebral white matter changes.  CT ANGIO HEAD/NECK W OR WO CONTRAST 01/10/2018 IMPRESSION: 1. Expected evolution of nonhemorrhagic infarcts involving the anterior limb of the right  internal capsule in the posterior centrum semi ovale. 2. Moderate stenosis of the proximal left M1 segment. Distal MCA branch vessels are intact. 3. Moderate stenosis of the distal left A1 segment. ACA branch vessels are normal bilaterally with a patent anterior communicating artery. 4. Atherosclerotic changes at the carotid bifurcations bilaterally without significant stenosis. 5. Tortuosity of the cervical internal carotid arteries without significant stenosis. This is nonspecific, but most commonly seen in the setting of hypertension.  ECHOCARDIOGRAM 01/11/2018 Study Conclusions - Left ventricle: The cavity size was normal. Systolic function was   normal. The estimated ejection fraction was in the range of 60%   to 65%. Mild concentric and moderate focal basal septal   hypertrophy. Wall motion was normal; there were no regional wall   motion abnormalities. Doppler parameters are consistent with   abnormal left ventricular relaxation (grade 1 diastolic   dysfunction). - Atrial septum: No defect or patent foramen ovale was identified. - Pulmonic valve: There was mild regurgitation.     ASSESSMENT: Era Parr is a 57 y.o. year old female here with right BG/TR infarcts on 02/05/2018 secondary to  small vessel disease. Vascular risk factors include HTN and HLD.     PLAN: -Continue {anticoagulants:31417}  and ***  for secondary stroke prevention -F/u with PCP regarding your *** management -continue to monitor BP at home  -Maintain strict control of hypertension with blood pressure goal below 130/90, diabetes with hemoglobin A1c goal below 6.5% and cholesterol with LDL cholesterol (bad cholesterol) goal below 70 mg/dL. I also advised the patient to eat a healthy diet with plenty of whole grains, cereals, fruits and vegetables, exercise regularly and maintain ideal body weight.  Follow up in *** or call earlier if needed   Greater than 50% of time during this 25 minute visit was spent on counseling,explanation of diagnosis of ***, reviewing risk factor management of ***, planning of further management, discussion with patient and family and coordination of care    Venancio Poisson, [facility]  [facility] [address] [place], [place] [address]  Phone [phone] Fax [phone]

## 2018-03-10 ENCOUNTER — Telehealth: Payer: Self-pay

## 2018-03-10 ENCOUNTER — Encounter: Payer: Self-pay | Admitting: Physical Therapy

## 2018-03-10 ENCOUNTER — Ambulatory Visit: Payer: BLUE CROSS/BLUE SHIELD | Admitting: Physical Therapy

## 2018-03-10 ENCOUNTER — Ambulatory Visit: Payer: Self-pay | Admitting: Adult Health

## 2018-03-10 ENCOUNTER — Encounter: Payer: Self-pay | Admitting: Adult Health

## 2018-03-10 ENCOUNTER — Ambulatory Visit: Payer: BLUE CROSS/BLUE SHIELD | Admitting: Neurology

## 2018-03-10 DIAGNOSIS — R2681 Unsteadiness on feet: Secondary | ICD-10-CM

## 2018-03-10 DIAGNOSIS — R2689 Other abnormalities of gait and mobility: Secondary | ICD-10-CM

## 2018-03-10 DIAGNOSIS — M6281 Muscle weakness (generalized): Secondary | ICD-10-CM

## 2018-03-10 DIAGNOSIS — M25662 Stiffness of left knee, not elsewhere classified: Secondary | ICD-10-CM

## 2018-03-10 NOTE — Telephone Encounter (Signed)
Patient no show for appt today. 

## 2018-03-10 NOTE — Therapy (Signed)
[facility] [address] [place], [place], [address] Phone: [phone]   Fax:  [phone]  Physical Therapy Treatment  Patient Details  Name: Lindsay Travis MRN: [id number] Date of Birth: 15-Mar-1961 Referring Provider: Dorcas Mcmurray, MD   Encounter Date: 03/10/2018  PT End of Session - 03/10/18 1222    Visit Number  10    Number of Visits  17    Date for PT Re-Evaluation  04/02/18    Authorization Type  BCBS    Authorization Time Period  50% co-insurance,  30 visit limit combined PT, OT, chiropractor    Authorization - Visit Number  10    Authorization - Number of Visits  30    PT Start Time  1130 started pt early due to schedule allowed for it    PT Stop Time  1215    PT Time Calculation (min)  45 min    Equipment Utilized During Treatment  Gait belt    Activity Tolerance  Patient tolerated treatment well;Patient limited by fatigue;No increased pain    Behavior During Therapy  WFL for tasks assessed/performed       Past Medical History:  Diagnosis Date  . Allergy   . Anemia   . Clotting disorder ([place])   . Diabetes mellitus    recent high blood sugar and has RX but not taking  med -b/c she doesn't think she  is diabetic, just had lots of sugar in her diet when  dx'd.  Marland Kitchen Hypertension   . Shortness of breath    on exertion- has low hgb    Past Surgical History:  Procedure Laterality Date  . ABDOMINAL HYSTERECTOMY  09/11/2011   Procedure: HYSTERECTOMY ABDOMINAL;  Surgeon: Frederico Hamman, MD;  Location: [facility];  Service: Gynecology;  Laterality: N/A;    There were no vitals filed for this visit.  Subjective Assessment - 03/10/18 1133    Subjective  No falls. No problems. She stated she had some tightness and weakness in both legs this morning.     Pertinent History  CVA, DM, HTN, obesity, chronic knee pain,     Limitations  Lifting;Standing;Walking    Patient Stated Goals  to walk better, stop falling, get  stronger in legs    Currently in Pain?  No/denies           [facility] Adult PT Treatment/Exercise - 03/10/18 1228      Ambulation/Gait   Ambulation/Gait  Yes    Ambulation/Gait Assistance  4: Min guard    Ambulation/Gait Assistance Details  tactile cues for balance to prevent fall    Ambulation Distance (Feet)  345 Feet    Assistive device  None    Gait Pattern  Step-through pattern;Decreased step length - left;Decreased stance time - right;Decreased stride length;Decreased hip/knee flexion - right;Trunk flexed;Wide base of support    Ambulation Surface  Level;Indoor    Gait Comments  dynamic gait around track tossing ball and naming foods A-Z, pt had decreased gt speed  and needed vc to continue tossing ball. Pt also ambulated while doing head turns side<>side; needed vc's to keep in straight line.       Knee/Hip Exercises: Stretches   Active Hamstring Stretch  Both;3 reps;30 seconds    Gastroc Stretch  Both;3 reps;30 seconds;Other (comment) seated with knee ext with strap          Balance Exercises - 03/10/18 1243      Balance Exercises: Standing   Tandem  Stance  Eyes open;Eyes closed;Foam/compliant surface;20 secs;2 reps    Rockerboard  Anterior/posterior;Lateral;Head turns;EO;EC;Intermittent UE support;10 reps    Other Standing Exercises  on blue mat pt was instructed to toe walk and heel walk fwd/bwd x 2. Min assist needed to maintain balance      Balance Exercises: Standing   SLS with Vectors Limitations  bil cone toe taps x 10 reps; needed occasional UE support for balance. Other exs inclueded sls on yoga block while tossiing ball to wall 30 sec x 2.           PT Short Term Goals - 03/03/18 1157      PT SHORT TERM GOAL #1   Title  Patient is independent with initial HEP. (All STGs Target Date: 03/06/2018)    Baseline  MET 03/03/2018    Time  4    Period  Weeks    Status  Achieved      PT SHORT TERM GOAL #2   Title  Berg Balance >/=47/56    Baseline  MET  03/03/2018  Berg Balance 56/56    Time  4    Period  Weeks    Status  Achieved      PT SHORT TERM GOAL #3   Title  Functional Gait Assessment >/= 17/30    Baseline  MET 03/03/2018  FGA 20/30    Time  4    Period  Weeks    Status  Achieved        PT Long Term Goals - 03/03/18 1158      PT LONG TERM GOAL #1   Title  Patient demonstrates & verbalizes understanding of ongoing HEP / fitness plan. (All LTGs Target Date 04/02/2018)    Time  8    Period  Weeks    Status  On-going    Target Date  04/02/18      PT LONG TERM GOAL #2   Title  Berg Balance >/= 52/56 to indicate lower fall risk.     Baseline  MET 03/03/2018  [name] 56/56    Time  8    Period  Weeks    Status  Achieved      PT LONG TERM GOAL #3   Title  Functional Gait Assessment >/= 24/30 to indicate lower fall risk.     Time  8    Period  Weeks    Status  Revised    Target Date  04/02/18      PT LONG TERM GOAL #4   Title  Patient ambulates 1000' outdoors including grass, ramps, curbs & stairs (1 rail) modified independent.     Time  8    Period  Weeks    Status  On-going    Target Date  04/02/18          Plan - 03/10/18 1223    Clinical Impression Statement  Today's skilled session focused on challenging balance and dynamic gait. Pt continues to complain of fatigue and soreness in her legs. Pt would benefit from continued PT to progress further toward unmet goals.    Rehab Potential  Good    PT Frequency  2x / week    PT Duration  8 weeks    PT Treatment/Interventions  ADLs/Self Care Home Management;Canalith Repostioning;DME Instruction;Gait training;Stair training;Functional mobility training;Therapeutic activities;Therapeutic exercise;Balance training;Neuromuscular re-education;Patient/family education;Vestibular    PT Next Visit Plan  dynamic standing balance on compliant surface. Strengthening of LEs.  Dynamic gait, dual tasking with gait.  Consulted and Agree with Plan of Care  Patient       Patient  will benefit from skilled therapeutic intervention in order to improve the following deficits and impairments:  Abnormal gait, Decreased activity tolerance, Decreased balance, Decreased endurance, Decreased mobility, Decreased range of motion, Decreased strength, Dizziness, Postural dysfunction, Pain  Visit Diagnosis: Other abnormalities of gait and mobility  Unsteadiness on feet  Muscle weakness (generalized)  Stiffness of left knee, not elsewhere classified     Problem List Patient Active Problem List   Diagnosis Date Noted  . Cerebral thrombosis with cerebral infarction 01/10/2018  . Hyperlipidemia   . Lower extremity weakness 01/09/2018  . Abnormality of gait   . Type 2 diabetes mellitus with complication, without long-term current use of insulin ([place]) 08/07/2015  . Bilateral knee pain 05/26/2012  . Varicose veins of lower extremities with other complications 63/84/6659  . Fibroid, uterus 04/18/2011  . Metrorrhagia 04/18/2011  . Hypertension 04/18/2011   Halina Andreas [facility] [address] [place], [place] [address] [phone] 03/10/18, 8:52 PM  Name: Lindsay Travis MRN: [id number] Date of Birth: [birth year]

## 2018-03-12 ENCOUNTER — Other Ambulatory Visit: Payer: Self-pay | Admitting: Family Medicine

## 2018-03-16 ENCOUNTER — Encounter: Payer: Self-pay | Admitting: Urgent Care

## 2018-03-17 ENCOUNTER — Encounter: Payer: Self-pay | Admitting: Physical Therapy

## 2018-03-17 ENCOUNTER — Ambulatory Visit: Payer: BLUE CROSS/BLUE SHIELD | Admitting: Physical Therapy

## 2018-03-17 DIAGNOSIS — R2689 Other abnormalities of gait and mobility: Secondary | ICD-10-CM

## 2018-03-17 DIAGNOSIS — M6281 Muscle weakness (generalized): Secondary | ICD-10-CM | POA: Diagnosis not present

## 2018-03-17 DIAGNOSIS — M25662 Stiffness of left knee, not elsewhere classified: Secondary | ICD-10-CM | POA: Diagnosis not present

## 2018-03-17 DIAGNOSIS — R2681 Unsteadiness on feet: Secondary | ICD-10-CM

## 2018-03-17 NOTE — Therapy (Signed)
[facility] [address] [place], [place], [address] Phone: [phone]   Fax:  [phone]  Physical Therapy Treatment  Patient Details  Name: Lindsay Travis MRN: [id number] Date of Birth: 1960-10-26 Referring Provider: Dorcas Mcmurray, MD   Encounter Date: 03/17/2018  PT End of Session - 03/17/18 1152    Visit Number  11    Number of Visits  17    Date for PT Re-Evaluation  04/02/18    Authorization Type  BCBS    Authorization Time Period  50% co-insurance,  30 visit limit combined PT, OT, chiropractor    Authorization - Visit Number  10    Authorization - Number of Visits  30    PT Start Time  1107    PT Stop Time  1145    PT Time Calculation (min)  38 min    Equipment Utilized During Treatment  Gait belt    Activity Tolerance  Patient tolerated treatment well;Patient limited by fatigue;No increased pain    Behavior During Therapy  WFL for tasks assessed/performed       Past Medical History:  Diagnosis Date  . Allergy   . Anemia   . Clotting disorder ([place])   . Diabetes mellitus    recent high blood sugar and has RX but not taking  med -b/c she doesn't think she  is diabetic, just had lots of sugar in her diet when  dx'd.  Marland Kitchen Hypertension   . Shortness of breath    on exertion- has low hgb    Past Surgical History:  Procedure Laterality Date  . ABDOMINAL HYSTERECTOMY  09/11/2011   Procedure: HYSTERECTOMY ABDOMINAL;  Surgeon: Frederico Hamman, MD;  Location: [facility];  Service: Gynecology;  Laterality: N/A;    There were no vitals filed for this visit.  Subjective Assessment - 03/17/18 1109    Subjective  No falls. No problems. She stated she had some tightness and weakness in both legs this morning.     Pertinent History  CVA, DM, HTN, obesity, chronic knee pain,     Limitations  Lifting;Standing;Walking    Patient Stated Goals  to walk better, stop falling, get stronger in legs    Currently in Pain?  Yes     Pain Score  3     Pain Location  Back    Pain Orientation  Lower;Medial    Pain Descriptors / Indicators  Sharp    Aggravating Factors   Standing, prolonged sitting    Pain Relieving Factors  Sleep, resting for short periods    Multiple Pain Sites  No       Hamstring Stretch x3 bil, VCs to hold for full 20sec  Gastroc Stretch x1 bil, adjustment made for strap length when patient complained of LBP with towel, gait belt was used instead, 20 sec hold Balance- Rockerboard ant/post direction with cone taps tall and short x5 bil, double taps x10 bil tall and short, min assist due to LOB with assistance from SPTA and parallel bars for recovery, light hand touch otherwise Airex to Step + March, Alternate feet x10 bil, VCs/TCs for proper performance of exercise and reminders to regain proper sequencing of steps, min guard due to slight LOB but patient was able to recover with use of parallel bars, no hands otherwise Bridge exercise causes back pain so had patient has stopped doing them.    PT Short Term Goals - 03/03/18 1157      PT SHORT TERM  GOAL #1   Title  Patient is independent with initial HEP. (All STGs Target Date: 03/06/2018)    Baseline  MET 03/03/2018    Time  4    Period  Weeks    Status  Achieved      PT SHORT TERM GOAL #2   Title  Berg Balance >/=47/56    Baseline  MET 03/03/2018  Berg Balance 56/56    Time  4    Period  Weeks    Status  Achieved      PT SHORT TERM GOAL #3   Title  Functional Gait Assessment >/= 17/30    Baseline  MET 03/03/2018  FGA 20/30    Time  4    Period  Weeks    Status  Achieved        PT Long Term Goals - 03/03/18 1158      PT LONG TERM GOAL #1   Title  Patient demonstrates & verbalizes understanding of ongoing HEP / fitness plan. (All LTGs Target Date 04/02/2018)    Time  8    Period  Weeks    Status  On-going    Target Date  04/02/18      PT LONG TERM GOAL #2   Title  Berg Balance >/= 52/56 to indicate lower fall risk.     Baseline   MET 03/03/2018  [name] 56/56    Time  8    Period  Weeks    Status  Achieved      PT LONG TERM GOAL #3   Title  Functional Gait Assessment >/= 24/30 to indicate lower fall risk.     Time  8    Period  Weeks    Status  Revised    Target Date  04/02/18      PT LONG TERM GOAL #4   Title  Patient ambulates 1000' outdoors including grass, ramps, curbs & stairs (1 rail) modified independent.     Time  8    Period  Weeks    Status  On-going    Target Date  04/02/18            Plan - 03/17/18 1152    Clinical Impression Statement  Today's session focused on challenging balance and improving LE flexibility. Pt. complained of LBP that impacts her ability to stand and walk for any prolonged period of time. Pt. would benefit from continued PT to progress toward any unmet goals and address remaining balance deficits.     Rehab Potential  Good    PT Frequency  2x / week    PT Duration  8 weeks    PT Treatment/Interventions  ADLs/Self Care Home Management;Canalith Repostioning;DME Instruction;Gait training;Stair training;Functional mobility training;Therapeutic activities;Therapeutic exercise;Balance training;Neuromuscular re-education;Patient/family education;Vestibular    PT Next Visit Plan  Dynamic standing balance on compliant surface. Strengthening of LEs. Dynamic gait, dual tasking with gait. Weight shift during balance tasks.    Consulted and Agree with Plan of Care  Patient       Patient will benefit from skilled therapeutic intervention in order to improve the following deficits and impairments:  Abnormal gait, Decreased activity tolerance, Decreased balance, Decreased endurance, Decreased mobility, Decreased range of motion, Decreased strength, Dizziness, Postural dysfunction, Pain  Visit Diagnosis: Other abnormalities of gait and mobility  Unsteadiness on feet  Muscle weakness (generalized)  Stiffness of left knee, not elsewhere classified     Problem List Patient Active  Problem List   Diagnosis Date Noted  .  Cerebral thrombosis with cerebral infarction 01/10/2018  . Hyperlipidemia   . Lower extremity weakness 01/09/2018  . Abnormality of gait   . Type 2 diabetes mellitus with complication, without long-term current use of insulin ([place]) 08/07/2015  . Bilateral knee pain 05/26/2012  . Varicose veins of lower extremities with other complications 20/26/6916  . Fibroid, uterus 04/18/2011  . Metrorrhagia 04/18/2011  . Hypertension 04/18/2011    Arthor Captain, [place] 03/17/2018, 12:07 PM  [facility] [address] [place], [place], [address] Phone: [phone]   Fax:  [phone]  Name: Lindsay Travis MRN: [id number] Date of Birth: 29-Mar-1961

## 2018-03-19 ENCOUNTER — Encounter: Payer: Self-pay | Admitting: Physical Therapy

## 2018-03-19 ENCOUNTER — Ambulatory Visit: Payer: BLUE CROSS/BLUE SHIELD | Admitting: Physical Therapy

## 2018-03-19 DIAGNOSIS — R2681 Unsteadiness on feet: Secondary | ICD-10-CM | POA: Diagnosis not present

## 2018-03-19 DIAGNOSIS — M6281 Muscle weakness (generalized): Secondary | ICD-10-CM | POA: Diagnosis not present

## 2018-03-19 DIAGNOSIS — M25662 Stiffness of left knee, not elsewhere classified: Secondary | ICD-10-CM | POA: Diagnosis not present

## 2018-03-19 DIAGNOSIS — R2689 Other abnormalities of gait and mobility: Secondary | ICD-10-CM | POA: Diagnosis not present

## 2018-03-19 NOTE — Patient Instructions (Signed)
Sickle Cell Anemia, Adult Sickle cell anemia is a condition where your red blood cells are shaped like sickles. Red blood cells carry oxygen through the body. Sickle-shaped red blood cells do not live as long as normal red blood cells. They also clump together and block blood from flowing through the blood vessels. These things prevent the body from getting enough oxygen. Sickle cell anemia causes organ damage and pain. It also increases the risk of infection. Follow these instructions at home:  Drink enough fluid to keep your pee (urine) clear or pale yellow. Drink more in hot weather and during exercise.  Do not smoke. Smoking lowers oxygen levels in the blood.  Only take over-the-counter or prescription medicines as told by your doctor.  Take antibiotic medicines as told by your doctor. Make sure you finish them even if you start to feel better.  Take supplements as told by your doctor.  Consider wearing a medical alert bracelet. This tells anyone caring for you in an emergency of your condition.  When traveling, keep your medical information, doctors' names, and the medicines you take with you at all times.  If you have a fever, do not take fever medicines right away. This could cover up a problem. Tell your doctor.  Keep all follow-up visits with your doctor. Sickle cell anemia requires regular medical care. Contact a doctor if: You have a fever. Get help right away if:  You feel dizzy or faint.  You have new belly (abdominal) pain, especially on the left side near the stomach area.  You have a lasting, often uncomfortable and painful erection of the penis (priapism). If it is not treated right away, you will become unable to have sex (impotence).  You have numbness in your arms or legs or you have a hard time moving them.  You have a hard time talking.  You have a fever or lasting symptoms for more than 2-3 days.  You have a fever and your symptoms suddenly get  worse.  You have signs or symptoms of infection. These include: ? Chills. ? Being more tired than normal (lethargy). ? Irritability. ? Poor eating. ? Throwing up (vomiting).  You have pain that is not helped with medicine.  You have shortness of breath.  You have pain in your chest.  You are coughing up pus-like or bloody mucus.  You have a stiff neck.  Your feet or hands swell or have pain.  Your belly looks bloated.  Your joints hurt. This information is not intended to replace advice given to you by your health care provider. Make sure you discuss any questions you have with your health care provider. Document Released: 06/16/2013 Document Revised: 02/01/2016 Document Reviewed: 04/07/2013 Elsevier Interactive Patient Education  2017 [employer]    Sickle Cell Anemia, Adult Sickle cell anemia is a condition in which red blood cells have an abnormal "sickle" shape. This abnormal shape shortens the cells' life span, which results in a lower than normal concentration of red blood cells in the blood. The sickle shape also causes the cells to clump together and block free blood flow through the blood vessels. As a result, the tissues and organs of the body do not receive enough oxygen. Sickle cell anemia causes organ damage and pain and increases the risk of infection. What are the causes? Sickle cell anemia is a genetic disorder. Those who receive two copies of the gene have the condition, and those who receive one copy have the trait. What  increases the risk? The sickle cell gene is most common in people whose families originated in [place]. Other areas of the globe where sickle cell trait occurs include the [place], [place]. What are the signs or symptoms?  Pain, especially in the extremities, back, chest, or abdomen (common). The pain may start suddenly or may develop following an illness, especially if there is  dehydration. Pain can also occur due to overexertion or exposure to extreme temperature changes.  Frequent severe bacterial infections, especially certain types of pneumonia and meningitis.  Pain and swelling in the hands and feet.  Decreased activity.  Loss of appetite.  Change in behavior.  Headaches.  Seizures.  Shortness of breath or difficulty breathing.  Vision changes.  Skin ulcers. Those with the trait may not have symptoms or they may have mild symptoms. How is this diagnosed? Sickle cell anemia is diagnosed with blood tests that demonstrate the genetic trait. It is often diagnosed during the newborn period, due to mandatory testing nationwide. A variety of blood tests, X-rays, CT scans, MRI scans, ultrasounds, and lung function tests may also be done to monitor the condition. How is this treated? Sickle cell anemia may be treated with:  Medicines. You may be given pain medicines, antibiotic medicines (to treat and prevent infections) or medicines to increase the production of certain types of hemoglobin.  Fluids.  Oxygen.  Blood transfusions.  Follow these instructions at home:  Drink enough fluid to keep your urine clear or pale yellow. Increase your fluid intake in hot weather and during exercise.  Do not smoke. Smoking lowers oxygen levels in the blood.  Only take over-the-counter or prescription medicines for pain, fever, or discomfort as directed by your health care provider.  Take antibiotics as directed by your health care provider. Make sure you finish them it even if you start to feel better.  Take supplements as directed by your health care provider.  Consider wearing a medical alert bracelet. This tells anyone caring for you in an emergency of your condition.  When traveling, keep your medical information, health care provider's names, and the medicines you take with you at all times.  If you develop a fever, do not take medicines to reduce the  fever right away. This could cover up a problem that is developing. Notify your health care provider.  Keep all follow-up appointments with your health care provider. Sickle cell anemia requires regular medical care. Contact a health care provider if: You have a fever. Get help right away if:  You feel dizzy or faint.  You have new abdominal pain, especially on the left side near the stomach area.  You develop a persistent, often uncomfortable and painful penile erection (priapism). If this is not treated immediately it will lead to impotence.  You have numbness your arms or legs or you have a hard time moving them.  You have a hard time with speech.  You have a fever or persistent symptoms for more than 2-3 days.  You have a fever and your symptoms suddenly get worse.  You have signs or symptoms of infection. These include: ? Chills. ? Abnormal tiredness (lethargy). ? Irritability. ? Poor eating. ? Vomiting.  You develop pain that is not helped with medicine.  You develop shortness of breath.  You have pain in your chest.  You are coughing up pus-like or bloody sputum.  You develop a stiff neck.  Your feet or hands swell  or have pain.  Your abdomen appears bloated.  You develop joint pain. This information is not intended to replace advice given to you by your health care provider. Make sure you discuss any questions you have with your health care provider. Document Released: 12/04/2005 Document Revised: 03/15/2016 Document Reviewed: 04/07/2013 Elsevier Interactive Patient Education  2017 [employer].

## 2018-03-20 ENCOUNTER — Ambulatory Visit: Payer: BLUE CROSS/BLUE SHIELD | Admitting: Adult Health

## 2018-03-20 ENCOUNTER — Encounter: Payer: Self-pay | Admitting: Adult Health

## 2018-03-20 VITALS — BP 134/89 | HR 64 | Ht 65.0 in | Wt 243.0 lb

## 2018-03-20 DIAGNOSIS — E118 Type 2 diabetes mellitus with unspecified complications: Secondary | ICD-10-CM | POA: Diagnosis not present

## 2018-03-20 DIAGNOSIS — I6330 Cerebral infarction due to thrombosis of unspecified cerebral artery: Secondary | ICD-10-CM | POA: Diagnosis not present

## 2018-03-20 DIAGNOSIS — R5383 Other fatigue: Secondary | ICD-10-CM | POA: Diagnosis not present

## 2018-03-20 DIAGNOSIS — E785 Hyperlipidemia, unspecified: Secondary | ICD-10-CM | POA: Diagnosis not present

## 2018-03-20 DIAGNOSIS — I10 Essential (primary) hypertension: Secondary | ICD-10-CM | POA: Diagnosis not present

## 2018-03-20 MED ORDER — ROSUVASTATIN CALCIUM 20 MG PO TABS: 20.0000 mg | ORAL_TABLET | Freq: Every day | ORAL | 3 refills | Status: DC

## 2018-03-20 MED ORDER — ASPIRIN 81 MG PO TBEC: 81.0000 mg | DELAYED_RELEASE_TABLET | Freq: Every day | ORAL | 3 refills | Status: AC

## 2018-03-20 NOTE — Progress Notes (Signed)
I agree with the above plan 

## 2018-03-20 NOTE — Patient Instructions (Signed)
Continue aspirin 81 mg daily  and stop lipitor and start crestor  for secondary stroke prevention  Continue to follow up with PCP regarding cholesterol, diabetes and blood pressure management   You will be called to schedule appointment for sleep apnea appointment  For your constipation, you can try colace but if this does not help, please follow up with PCP  Continue to monitor blood pressure at home  Maintain strict control of hypertension with blood pressure goal below 130/90, diabetes with hemoglobin A1c goal below 6.5% and cholesterol with LDL cholesterol (bad cholesterol) goal below 70 mg/dL. I also advised the patient to eat a healthy diet with plenty of whole grains, cereals, fruits and vegetables, exercise regularly and maintain ideal body weight.  Followup in the future with me in 3 months or call earlier if needed       Thank you for coming to see [place] at [facility]. I hope we have been able to provide you high quality care today.  You may receive a patient satisfaction survey over the next few weeks. We would appreciate your feedback and comments so that we may continue to improve ourselves and the health of our patients.

## 2018-03-20 NOTE — Progress Notes (Signed)
[facility] [address] [place]. [place] [address] [phone]       OFFICE FOLLOW UP NOTE  Ms. Sophiagrace Benbrook Date of Birth:  [birth year] Medical Record Number:  [id number]   Reason for Referral:  hospital stroke follow up  CHIEF COMPLAINT:  Chief Complaint  Patient presents with  . New Patient (Initial Visit)    stroke follow up. Patient reports that she feels like she has cramps all over her body at times.     HPI: Huxley Vanwagoner is being seen today for initial visit in the office for right BG/CR infarct on 01/09/2018. History obtained from patient and chart review. Reviewed all radiology images and labs personally.  Ms. Audine Mangione is a 57 y.o. female with history of DM and HTN who was admitted for imbalanced gait. No tPA given due to OSW.  MRI reviewed and showed right BG/CR infarct.  CTA head and neck showed a left M1 and A1 moderate stenosis.  2D echo showed EF of 60 to 65% without evidence of PFO.  LDL 130 as patient was not on statin PTA is recommended to start Lipitor 40 mg.  A1c elevated at 8.0 and recommended tight glycemic control with close PCP follow-up.  Patient was not on antithrombotic PTA and recommended DAPT for 3 weeks on aspirin alone.  Therapy recommended outpatient PT and was discharged in stable condition.  Patient is being seen today for hospital follow-up and from a stroke standpoint is doing well.  She continues to participate in physical therapy at our neuro rehab clinic next-door for mild and balance issues.  She continues to take aspirin without side effects of bleeding or bruising.  She continues to take Lipitor but does state that she feels constant "cramping" throughout her body since she has left the hospital.  Blood pressure satisfactory 134/89.  Patient does also have complaints of excessive daytime fatigue.  She states prior to the stroke she had problems with insomnia but after the stroke she states she can sleep well at night but  feels as though if she sits during the day, she will fall asleep and can nap easily.  She also states that towards the end of the day, with this increased fatigue is also makes her balance slightly worse and feels a "heaviness" in her legs.  Patient does have a history of anemia and has also been following up with PCP in regards to these complaints of fatigue.  Patient is attempting to stay active but due to excessive fatigue and muscle cramping this has been a challenge for her.  Patient did return to all previous activities such as driving and working.  Denies new or worsening stroke/TIA symptoms.   ROS:   14 system review of systems performed and negative with exception of decreased energy and sleepiness  PMH:  Past Medical History:  Diagnosis Date  . Allergy   . Anemia   . Clotting disorder ([place])   . Diabetes mellitus    recent high blood sugar and has RX but not taking  med -b/c she doesn't think she  is diabetic, just had lots of sugar in her diet when  dx'd.  Marland Kitchen Hypertension   . Shortness of breath    on exertion- has low hgb    PSH:  Past Surgical History:  Procedure Laterality Date  . ABDOMINAL HYSTERECTOMY  09/11/2011   Procedure: HYSTERECTOMY ABDOMINAL;  Surgeon: Frederico Hamman, MD;  Location: [facility];  Service: Gynecology;  Laterality: N/A;  Social History:  Social History   Socioeconomic History  . Marital status: Divorced    Spouse name: Not on file  . Number of children: Not on file  . Years of education: Not on file  . Highest education level: Not on file  Occupational History  . Not on file  Social Needs  . Financial resource strain: Not on file  . Food insecurity:    Worry: Not on file    Inability: Not on file  . Transportation needs:    Medical: Not on file    Non-medical: Not on file  Tobacco Use  . Smoking status: Never Smoker  . Smokeless tobacco: Never Used  Substance and Sexual Activity  . Alcohol use: No  . Drug use: No  . Sexual  activity: Never  Lifestyle  . Physical activity:    Days per week: Not on file    Minutes per session: Not on file  . Stress: Not on file  Relationships  . Social connections:    Talks on phone: Not on file    Gets together: Not on file    Attends religious service: Not on file    Active member of club or organization: Not on file    Attends meetings of clubs or organizations: Not on file    Relationship status: Not on file  . Intimate partner violence:    Fear of current or ex partner: Not on file    Emotionally abused: Not on file    Physically abused: Not on file    Forced sexual activity: Not on file  Other Topics Concern  . Not on file  Social History Narrative  . Not on file    Family History:  Family History  Problem Relation Age of Onset  . Hypertension Other     Medications:   Current Outpatient Medications on File Prior to Visit  Medication Sig Dispense Refill  . amLODipine (NORVASC) 5 MG tablet Take 1 tablet (5 mg total) by mouth daily. 90 tablet 1  . blood glucose meter kit and supplies KIT Please check fasting blood sugar once daily. 1 each 0  . clopidogrel (PLAVIX) 75 MG tablet Take 1 tablet (75 mg total) by mouth daily. 90 tablet 3  . glipiZIDE (GLUCOTROL) 5 MG tablet Take 1 tablet (5 mg total) by mouth 2 (two) times daily before a meal. 60 tablet 5  . hydrochlorothiazide (HYDRODIURIL) 25 MG tablet Take 1 tablet (25 mg total) by mouth daily. 90 tablet 1   No current facility-administered medications on file prior to visit.     Allergies:   Allergies  Allergen Reactions  . Lisinopril Other (See Comments)    Did not feel good  . Losartan Other (See Comments)    headache  . Aspirin Other (See Comments)    Abdominal pain     Physical Exam  Vitals:   03/20/18 0806  BP: 134/89  Pulse: 64  Weight: 243 lb (110.2 kg)  Height: 5' 5" (1.651 m)   Body mass index is 40.44 kg/m. No exam data present  General: well developed, well nourished,  pleasant middle-aged, African female, seated, in no evident distress Head: head normocephalic and atraumatic.   Neck: supple with no carotid or supraclavicular bruits Cardiovascular: regular rate and rhythm, no murmurs Musculoskeletal: no deformity Skin:  no rash/petichiae Vascular:  Normal pulses all extremities  Neurologic Exam Mental Status: Awake and fully alert. Oriented to place and time. Recent and remote memory intact. Attention span,  concentration and fund of knowledge appropriate. Mood and affect appropriate.  Cranial Nerves: Fundoscopic exam reveals sharp disc margins. Pupils equal, briskly reactive to light. Extraocular movements full without nystagmus. Visual fields full to confrontation. Hearing intact. Facial sensation intact. Face, tongue, palate moves normally and symmetrically.  Motor: Normal bulk and tone. Normal strength in all tested extremity muscles. Sensory.: intact to touch , pinprick , position and vibratory sensation.  Coordination: Rapid alternating movements normal in all extremities. Finger-to-nose and heel-to-shin performed accurately bilaterally. Gait and Station: Arises from chair without difficulty. Stance is normal. Gait demonstrates normal stride length and balance . Able to heel, toe and tandem walk without difficulty.  Reflexes: 1+ and symmetric. Toes downgoing.    NIHSS  0 Modified Rankin  1   Diagnostic Data (Labs, Imaging, Testing)  MR BRAIN WO CONTRAST 01/09/2018 IMPRESSION: MRI HEAD IMPRESSION 1. Two separate areas of restricted diffusion involving the right basal ganglia/corona radiata as above, favored to reflect acute ischemic vascular infarcts. Demyelinating disease could conceivably have this appearance as well, and could be considered in the correct clinical setting. 2. Additional mild nonspecific cerebral white matter changes.  CT ANGIO HEAD W OR WO CONTRAST CT ANGIO NECK W OR WO CONTRAST 01/10/2018 IMPRESSION: 1. Expected evolution  of nonhemorrhagic infarcts involving the anterior limb of the right internal capsule in the posterior centrum semi ovale. 2. Moderate stenosis of the proximal left M1 segment. Distal MCA branch vessels are intact. 3. Moderate stenosis of the distal left A1 segment. ACA branch vessels are normal bilaterally with a patent anterior communicating artery. 4. Atherosclerotic changes at the carotid bifurcations bilaterally without significant stenosis. 5. Tortuosity of the cervical internal carotid arteries without significant stenosis. This is nonspecific, but most commonly seen in the setting of hypertension.  ECHOCARDIOGRAM 01/11/2018 Study Conclusions - Left ventricle: The cavity size was normal. Systolic function was   normal. The estimated ejection fraction was in the range of 60%   to 65%. Mild concentric and moderate focal basal septal   hypertrophy. Wall motion was normal; there were no regional wall   motion abnormalities. Doppler parameters are consistent with   abnormal left ventricular relaxation (grade 1 diastolic   dysfunction). - Atrial septum: No defect or patent foramen ovale was identified. - Pulmonic valve: There was mild regurgitation.      ASSESSMENT: Rehema Muffley is a 57 y.o. year old female here with right BG/CR infarct on 01/09/2018 secondary to small vessel disease. Vascular risk factors include HTN, HLD and DM.     PLAN: -Continue aspirin 81 mg daily  and stop Lipitor and start Crestor due to myalgias for secondary stroke prevention -advised patient that if these muscle cramps do not subside within a few days, she should follow-up with her PCP for other underlying causes -F/u with PCP regarding your DM, HLD and HTN management -Referral placed for sleep apnea evaluation due to excessive daytime fatigue -continue to monitor BP at home -Continue to stay active and eat a healthy diet -Maintain strict control of hypertension with blood pressure goal below  130/90, diabetes with hemoglobin A1c goal below 6.5% and cholesterol with LDL cholesterol (bad cholesterol) goal below 70 mg/dL. I also advised the patient to eat a healthy diet with plenty of whole grains, cereals, fruits and vegetables, exercise regularly and maintain ideal body weight.  Follow up in 3 months or call earlier if needed   Greater than 50% of time during this 25 minute visit was spent on counseling,explanation of diagnosis  of left BG/CR infarct, reviewing risk factor management of HTN, HLD and DM, planning of further management, discussion with patient and family and coordination of care    Venancio Poisson, [facility]  [facility] [address] [place], [place] [address]  Phone [phone] Fax [phone]

## 2018-03-20 NOTE — Therapy (Signed)
[facility] [address] [place], [place], [address] Phone: [phone]   Fax:  [phone]  Physical Therapy Treatment  Patient Details  Name: Lindsay Travis MRN: [id number] Date of Birth: October 01, 1960 Referring Provider: Dorcas Mcmurray, MD   Encounter Date: 03/19/2018  PT End of Session - 03/19/18 1602    Visit Number  12    Number of Visits  17    Date for PT Re-Evaluation  04/02/18    Authorization Type  BCBS    Authorization Time Period  50% co-insurance,  30 visit limit combined PT, OT, chiropractor    Authorization - Visit Number  12    Authorization - Number of Visits  30    PT Start Time  1116    PT Stop Time  1146    PT Time Calculation (min)  30 min    Equipment Utilized During Treatment  Gait belt    Activity Tolerance  Patient tolerated treatment well;Patient limited by fatigue;No increased pain    Behavior During Therapy  WFL for tasks assessed/performed       Past Medical History:  Diagnosis Date  . Allergy   . Anemia   . Clotting disorder ([place])   . Diabetes mellitus    recent high blood sugar and has RX but not taking  med -b/c she doesn't think she  is diabetic, just had lots of sugar in her diet when  dx'd.  Marland Kitchen Hypertension   . Shortness of breath    on exertion- has low hgb    Past Surgical History:  Procedure Laterality Date  . ABDOMINAL HYSTERECTOMY  09/11/2011   Procedure: HYSTERECTOMY ABDOMINAL;  Surgeon: Frederico Hamman, MD;  Location: [facility];  Service: Gynecology;  Laterality: N/A;    There were no vitals filed for this visit.  Subjective Assessment - 03/19/18 1118    Subjective  no falls. She has been doing her exercises. She reports her legs are tired.     Pertinent History  CVA, DM, HTN, obesity, chronic knee pain,     Limitations  Lifting;Standing;Walking    Patient Stated Goals  to walk better, stop falling, get stronger in legs    Currently in Pain?  No/denies        Self-Care: PT encouraged pt to discuss fatigue with her physicians. She reports history of anemia. She reported sickle cell history but no note in her records. PT encouraged her to have a conversation with her physicians. Pt verbalized understanding.                 [facility] PT Treatment/Exercise - 03/19/18 1130      Ambulation/Gait   Ambulation/Gait  Yes    Ambulation/Gait Assistance  4: Min assist    Ambulation/Gait Assistance Details  Patient caught right foot 2x while ambulating in PT gym    Assistive device  None      High Level Balance   High Level Balance Activities  Side stepping;Braiding;Backward walking;Tandem walking;Marching forwards;Marching backwards;Other (comment) heel & toe walking    High Level Balance Comments  tactile cues for balance             PT Education - 03/19/18 1130    Education Details  Discussing fatigue symptoms with physicians.     Person(s) Educated  Patient    Methods  Explanation;Verbal cues    Comprehension  Verbalized understanding       PT Short Term Goals - 03/03/18 1157  PT SHORT TERM GOAL #1   Title  Patient is independent with initial HEP. (All STGs Target Date: 03/06/2018)    Baseline  MET 03/03/2018    Time  4    Period  Weeks    Status  Achieved      PT SHORT TERM GOAL #2   Title  Berg Balance >/=47/56    Baseline  MET 03/03/2018  Berg Balance 56/56    Time  4    Period  Weeks    Status  Achieved      PT SHORT TERM GOAL #3   Title  Functional Gait Assessment >/= 17/30    Baseline  MET 03/03/2018  FGA 20/30    Time  4    Period  Weeks    Status  Achieved        PT Long Term Goals - 03/03/18 1158      PT LONG TERM GOAL #1   Title  Patient demonstrates & verbalizes understanding of ongoing HEP / fitness plan. (All LTGs Target Date 04/02/2018)    Time  8    Period  Weeks    Status  On-going    Target Date  04/02/18      PT LONG TERM GOAL #2   Title  Berg Balance >/= 52/56 to indicate  lower fall risk.     Baseline  MET 03/03/2018  [name] 56/56    Time  8    Period  Weeks    Status  Achieved      PT LONG TERM GOAL #3   Title  Functional Gait Assessment >/= 24/30 to indicate lower fall risk.     Time  8    Period  Weeks    Status  Revised    Target Date  04/02/18      PT LONG TERM GOAL #4   Title  Patient ambulates 1000' outdoors including grass, ramps, curbs & stairs (1 rail) modified independent.     Time  8    Period  Weeks    Status  On-going    Target Date  04/02/18            Plan - 03/19/18 1924    Clinical Impression Statement  Patient continues to experience fatigue issues limiting her activities. She reports more fatigue in legs. She also reports history of anemia.     Rehab Potential  Good    PT Frequency  2x / week    PT Duration  8 weeks    PT Treatment/Interventions  ADLs/Self Care Home Management;Canalith Repostioning;DME Instruction;Gait training;Stair training;Functional mobility training;Therapeutic activities;Therapeutic exercise;Balance training;Neuromuscular re-education;Patient/family education;Vestibular    PT Next Visit Plan  Dynamic standing balance on compliant surface. Strengthening of LEs. Dynamic gait, dual tasking with gait. Weight shift during balance tasks.    Consulted and Agree with Plan of Care  Patient       Patient will benefit from skilled therapeutic intervention in order to improve the following deficits and impairments:  Abnormal gait, Decreased activity tolerance, Decreased balance, Decreased endurance, Decreased mobility, Decreased range of motion, Decreased strength, Dizziness, Postural dysfunction, Pain  Visit Diagnosis: Other abnormalities of gait and mobility  Unsteadiness on feet  Muscle weakness (generalized)     Problem List Patient Active Problem List   Diagnosis Date Noted  . Cerebral thrombosis with cerebral infarction 01/10/2018  . Hyperlipidemia   . Lower extremity weakness 01/09/2018  .  Abnormality of gait   . Type 2 diabetes mellitus  with complication, without long-term current use of insulin ([place]) 08/07/2015  . Bilateral knee pain 05/26/2012  . Varicose veins of lower extremities with other complications 03/02/7627  . Fibroid, uterus 04/18/2011  . Metrorrhagia 04/18/2011  . Hypertension 04/18/2011    WALDRON,ROBIN PT, DPT 03/20/2018, 9:27 AM  [facility] [address] [place], [place], [address] Phone: [phone]   Fax:  [phone]  Name: Geneive Sandstrom MRN: [id number] Date of Birth: 1961/07/08

## 2018-03-24 ENCOUNTER — Encounter: Payer: Self-pay | Admitting: Physical Therapy

## 2018-03-24 ENCOUNTER — Ambulatory Visit: Payer: BLUE CROSS/BLUE SHIELD | Admitting: Physical Therapy

## 2018-03-24 DIAGNOSIS — R2689 Other abnormalities of gait and mobility: Secondary | ICD-10-CM | POA: Diagnosis not present

## 2018-03-24 DIAGNOSIS — M6281 Muscle weakness (generalized): Secondary | ICD-10-CM | POA: Diagnosis not present

## 2018-03-24 DIAGNOSIS — R2681 Unsteadiness on feet: Secondary | ICD-10-CM | POA: Diagnosis not present

## 2018-03-24 DIAGNOSIS — M25662 Stiffness of left knee, not elsewhere classified: Secondary | ICD-10-CM | POA: Diagnosis not present

## 2018-03-24 NOTE — Therapy (Signed)
[facility] [address] [place], [place], [address] Phone: [phone]   Fax:  [phone]  Physical Therapy Treatment  Patient Details  Name: Lindsay Travis MRN: [id number] Date of Birth: September 02, 1961 Referring Provider: Dorcas Mcmurray, MD   Encounter Date: 03/24/2018  PT End of Session - 03/24/18 1152    Visit Number  13    Number of Visits  17    Date for PT Re-Evaluation  04/02/18    Authorization Type  BCBS    Authorization Time Period  50% co-insurance,  30 visit limit combined PT, OT, chiropractor    Authorization - Visit Number  13   Authorization - Number of Visits  30    PT Start Time  1106    PT Stop Time  1146    PT Time Calculation (min)  40 min    Equipment Utilized During Treatment  Gait belt    Activity Tolerance  Patient tolerated treatment well;Patient limited by fatigue;No increased pain    Behavior During Therapy  WFL for tasks assessed/performed       Past Medical History:  Diagnosis Date  . Allergy   . Anemia   . Clotting disorder ([place])   . Diabetes mellitus    recent high blood sugar and has RX but not taking  med -b/c she doesn't think she  is diabetic, just had lots of sugar in her diet when  dx'd.  Marland Kitchen Hypertension   . Shortness of breath    on exertion- has low hgb    Past Surgical History:  Procedure Laterality Date  . ABDOMINAL HYSTERECTOMY  09/11/2011   Procedure: HYSTERECTOMY ABDOMINAL;  Surgeon: Frederico Hamman, MD;  Location: [facility];  Service: Gynecology;  Laterality: N/A;    There were no vitals filed for this visit.  Subjective Assessment - 03/24/18 1109    Subjective  no falls. She has been doing her exercises. She reports she is tired from her weekend at the beach and the pool yesterday.  (Pended)     Pertinent History  CVA, DM, HTN, obesity, chronic knee pain,   (Pended)     Limitations  Lifting;Standing;Walking  (Pended)     Patient Stated Goals  to walk better, stop  falling, get stronger in legs  (Pended)     Currently in Pain?  No/denies  (Pended)     Multiple Pain Sites  No  (Pended)        Ther.Ex.- Hamstring Stretch- 2 reps bil, 20 sec each, verbal reminder to hold for full count Functional Reaching at counter- 3 cones, 8 reps shelf to counter and back to shelf, stationary feet on red mat; progressed to stepping laterally and reaching to outer cabinets for cones and bringing them down from the shelves and returning them x8 reps bil, VCs to maintain sequence and avoid picking up more than one cone at a time as well as prevent trunk rotation, Min guard due to slight stumbles but recovery on pt's part. Ended this activity with reaching down into a low cabinet and bringing 2 cones up to the counter and back x 8 reps with VCs to use legs to assist and take strain off the back.  Mini Squat- in front of chair x10 reps, significant VCs for proper technique and posture along with tactile cues to assist with getting desired result, had to stop exercise due to knee pain from arthritis Wall Slide- x3 reps, had to stop exercise due to knee pain  as pt could not hold position Sit to stands x10 reps from 14" box with cushion (simulated couch), no UE support, Independent, though her legs were clearly fatigued demo'd by visible tremors Cybex Leg Press- BLE's 55# 2 sets, 15 reps; RLE 35# 1 set, 10 reps; LLE 35# 1 set, 10 reps     PT Short Term Goals - 03/03/18 1157      PT SHORT TERM GOAL #1   Title  Patient is independent with initial HEP. (All STGs Target Date: 03/06/2018)    Baseline  MET 03/03/2018    Time  4    Period  Weeks    Status  Achieved      PT SHORT TERM GOAL #2   Title  Berg Balance >/=47/56    Baseline  MET 03/03/2018  Berg Balance 56/56    Time  4    Period  Weeks    Status  Achieved      PT SHORT TERM GOAL #3   Title  Functional Gait Assessment >/= 17/30    Baseline  MET 03/03/2018  FGA 20/30    Time  4    Period  Weeks    Status  Achieved         PT Long Term Goals - 03/03/18 1158      PT LONG TERM GOAL #1   Title  Patient demonstrates & verbalizes understanding of ongoing HEP / fitness plan. (All LTGs Target Date 04/02/2018)    Time  8    Period  Weeks    Status  On-going    Target Date  04/02/18      PT LONG TERM GOAL #2   Title  Berg Balance >/= 52/56 to indicate lower fall risk.     Baseline  MET 03/03/2018  [name] 56/56    Time  8    Period  Weeks    Status  Achieved      PT LONG TERM GOAL #3   Title  Functional Gait Assessment >/= 24/30 to indicate lower fall risk.     Time  8    Period  Weeks    Status  Revised    Target Date  04/02/18      PT LONG TERM GOAL #4   Title  Patient ambulates 1000' outdoors including grass, ramps, curbs & stairs (1 rail) modified independent.     Time  8    Period  Weeks    Status  On-going    Target Date  04/02/18        Plan - 03/24/18 1247    Clinical Impression Statement  Today's session focused on LE strengthening in order to improve pt's reports of fatigue from her legs. She says that she is trying to get to the gym and enjoyed using the leg press today, so she was told the weights that we used if she decides to try it herself the next time she goes. Pt. would continue to benefit from PT in order to meet her remaining goals and keep working on LE strength.    Rehab Potential  Good    PT Frequency  2x / week    PT Duration  8 weeks    PT Treatment/Interventions  ADLs/Self Care Home Management;Canalith Repostioning;DME Instruction;Gait training;Stair training;Functional mobility training;Therapeutic activities;Therapeutic exercise;Balance training;Neuromuscular re-education;Patient/family education;Vestibular    PT Next Visit Plan  Dynamic standing balance on compliant surface. Strengthening of LEs (Leg Press). Dynamic gait, dual tasking with gait. Weight shift during  balance tasks.    Consulted and Agree with Plan of Care  Patient       Patient will benefit from  skilled therapeutic intervention in order to improve the following deficits and impairments:  Abnormal gait, Decreased activity tolerance, Decreased balance, Decreased endurance, Decreased mobility, Decreased range of motion, Decreased strength, Dizziness, Postural dysfunction, Pain  Visit Diagnosis: Other abnormalities of gait and mobility  Unsteadiness on feet  Muscle weakness (generalized)     Problem List Patient Active Problem List   Diagnosis Date Noted  . Cerebral thrombosis with cerebral infarction 01/10/2018  . Hyperlipidemia   . Lower extremity weakness 01/09/2018  . Abnormality of gait   . Type 2 diabetes mellitus with complication, without long-term current use of insulin ([place]) 08/07/2015  . Bilateral knee pain 05/26/2012  . Varicose veins of lower extremities with other complications 13/04/6577  . Fibroid, uterus 04/18/2011  . Metrorrhagia 04/18/2011  . Hypertension 04/18/2011    Arthor Captain, [place] 03/24/2018, 12:51 PM  [facility] [address] [place], [place], [address] Phone: [phone]   Fax:  [phone]  Name: Aislee Landgren MRN: [id number] Date of Birth: 1961-06-20

## 2018-03-26 ENCOUNTER — Encounter: Payer: Self-pay | Admitting: Physical Therapy

## 2018-03-26 ENCOUNTER — Ambulatory Visit: Payer: BLUE CROSS/BLUE SHIELD | Admitting: Physical Therapy

## 2018-03-26 DIAGNOSIS — R2689 Other abnormalities of gait and mobility: Secondary | ICD-10-CM

## 2018-03-26 DIAGNOSIS — M6281 Muscle weakness (generalized): Secondary | ICD-10-CM

## 2018-03-26 DIAGNOSIS — R2681 Unsteadiness on feet: Secondary | ICD-10-CM | POA: Diagnosis not present

## 2018-03-26 DIAGNOSIS — M25662 Stiffness of left knee, not elsewhere classified: Secondary | ICD-10-CM | POA: Diagnosis not present

## 2018-03-28 NOTE — Therapy (Signed)
[facility] [address] [place], [place], [address] Phone: [phone]   Fax:  [phone]  Physical Therapy Treatment  Patient Details  Name: Lindsay Travis MRN: [id number] Date of Birth: [birth year] Referring Provider: Dorcas Mcmurray, MD   Encounter Date: 03/26/2018     03/26/18 1109  PT Visits / Re-Eval  Visit Number 14  Number of Visits 17  Date for PT Re-Evaluation 04/02/18  Authorization  Authorization Type BCBS  Authorization Time Period 50% co-insurance,  30 visit limit combined PT, OT, chiropractor  Authorization - Visit Number 14  Authorization - Number of Visits 30  PT Time Calculation  PT Start Time 1107 (pt was late for appt)  PT Stop Time 1145  PT Time Calculation (min) 38 min  PT - End of Session  Equipment Utilized During Treatment Gait belt  Activity Tolerance Patient tolerated treatment well;Patient limited by fatigue;No increased pain  Behavior During Therapy WFL for tasks assessed/performed    Past Medical History:  Diagnosis Date  . Allergy   . Anemia   . Clotting disorder ([place])   . Diabetes mellitus    recent high blood sugar and has RX but not taking  med -b/c she doesn't think she  is diabetic, just had lots of sugar in her diet when  dx'd.  Marland Kitchen Hypertension   . Shortness of breath    on exertion- has low hgb    Past Surgical History:  Procedure Laterality Date  . ABDOMINAL HYSTERECTOMY  09/11/2011   Procedure: HYSTERECTOMY ABDOMINAL;  Surgeon: Frederico Hamman, MD;  Location: [facility];  Service: Gynecology;  Laterality: N/A;    There were no vitals filed for this visit.     03/26/18 1109  Symptoms/Limitations  Subjective No new complaints. No falls or pain. Does continue to report heaviness in her legs at times.   Pertinent History CVA, DM, HTN, obesity, chronic knee pain,   Limitations Lifting;Standing;Walking  Patient Stated Goals to walk better, stop falling, get stronger in  legs  Pain Assessment  Currently in Pain? No/denies  Pain Score 0      03/26/18 1110  Ambulation/Gait  Ambulation/Gait Yes  Ambulation/Gait Assistance 5: Supervision  Ambulation/Gait Assistance Details no foot scuffing or balance loss noted with gait today  Ambulation Distance (Feet) 500 Feet  Assistive device None  Gait Pattern Step-through pattern;Decreased step length - left;Decreased stance time - right;Decreased stride length;Decreased hip/knee flexion - right;Trunk flexed;Wide base of support  Ambulation Surface Level;Unlevel;Indoor;Outdoor;Paved;Grass  High Level Balance  High Level Balance Activities Marching forwards;Marching backwards;Tandem walking;Braiding;Head turns (tandem, toe walk both fwd/bwd, )  High Level Balance Comments on both red mats: 3 laps each with no UE support, min guard to min assist for balance. cues needed on posture and ex form. Increased time/cues needed on proper sequencing with braiding activity with this one being performed 5-6 laps each way for instruction. head turns with walking on mats performed in left<>right direction, then up<>down directions.       PT Short Term Goals - 03/03/18 1157      PT SHORT TERM GOAL #1   Title  Patient is independent with initial HEP. (All STGs Target Date: 03/06/2018)    Baseline  MET 03/03/2018    Time  4    Period  Weeks    Status  Achieved      PT SHORT TERM GOAL #2   Title  Berg Balance >/=47/56    Baseline  MET 03/03/2018  [name]  Balance 56/56    Time  4    Period  Weeks    Status  Achieved      PT SHORT TERM GOAL #3   Title  Functional Gait Assessment >/= 17/30    Baseline  MET 03/03/2018  FGA 20/30    Time  4    Period  Weeks    Status  Achieved        PT Long Term Goals - 03/03/18 1158      PT LONG TERM GOAL #1   Title  Patient demonstrates & verbalizes understanding of ongoing HEP / fitness plan. (All LTGs Target Date 04/02/2018)    Time  8    Period  Weeks    Status  On-going    Target  Date  04/02/18      PT LONG TERM GOAL #2   Title  Berg Balance >/= 52/56 to indicate lower fall risk.     Baseline  MET 03/03/2018  [name] 56/56    Time  8    Period  Weeks    Status  Achieved      PT LONG TERM GOAL #3   Title  Functional Gait Assessment >/= 24/30 to indicate lower fall risk.     Time  8    Period  Weeks    Status  Revised    Target Date  04/02/18      PT LONG TERM GOAL #4   Title  Patient ambulates 1000' outdoors including grass, ramps, curbs & stairs (1 rail) modified independent.     Time  8    Period  Weeks    Status  On-going    Target Date  04/02/18         03/26/18 1109  Plan  Clinical Impression Statement Today's skilled session continued to address dynamic gait and dynamic balance activities to improve pt's balance reactions. No issues reported in session today. Pt appears to be on target to meet goals at end of plan of care next week.   Pt will benefit from skilled therapeutic intervention in order to improve on the following deficits Abnormal gait;Decreased activity tolerance;Decreased balance;Decreased endurance;Decreased mobility;Decreased range of motion;Decreased strength;Dizziness;Postural dysfunction;Pain  Rehab Potential Good  PT Frequency 2x / week  PT Duration 8 weeks  PT Treatment/Interventions ADLs/Self Care Home Management;Canalith Repostioning;DME Instruction;Gait training;Stair training;Functional mobility training;Therapeutic activities;Therapeutic exercise;Balance training;Neuromuscular re-education;Patient/family education;Vestibular  PT Next Visit Plan begin to check LTGs for anticpated discharge at end of POC (next week)  Consulted and Agree with Plan of Care Patient          Patient will benefit from skilled therapeutic intervention in order to improve the following deficits and impairments:  Abnormal gait, Decreased activity tolerance, Decreased balance, Decreased endurance, Decreased mobility, Decreased range of motion,  Decreased strength, Dizziness, Postural dysfunction, Pain  Visit Diagnosis: Other abnormalities of gait and mobility  Unsteadiness on feet  Muscle weakness (generalized)     Problem List Patient Active Problem List   Diagnosis Date Noted  . Cerebral thrombosis with cerebral infarction 01/10/2018  . Hyperlipidemia   . Lower extremity weakness 01/09/2018  . Abnormality of gait   . Type 2 diabetes mellitus with complication, without long-term current use of insulin ([place]) 08/07/2015  . Bilateral knee pain 05/26/2012  . Varicose veins of lower extremities with other complications 64/40/3474  . Fibroid, uterus 04/18/2011  . Metrorrhagia 04/18/2011  . Hypertension 04/18/2011    Willow Ora, PTA, [facility] [phone]  [address] [place], [place] [address] [phone] 03/28/18, 4:10 PM   Name: Lindsay Travis MRN: [id number] Date of Birth: 03-10-1961

## 2018-03-31 ENCOUNTER — Ambulatory Visit: Payer: BLUE CROSS/BLUE SHIELD | Admitting: Physical Therapy

## 2018-03-31 ENCOUNTER — Encounter: Payer: Self-pay | Admitting: Physical Therapy

## 2018-03-31 DIAGNOSIS — R2681 Unsteadiness on feet: Secondary | ICD-10-CM

## 2018-03-31 DIAGNOSIS — M6281 Muscle weakness (generalized): Secondary | ICD-10-CM

## 2018-03-31 DIAGNOSIS — M25662 Stiffness of left knee, not elsewhere classified: Secondary | ICD-10-CM | POA: Diagnosis not present

## 2018-03-31 DIAGNOSIS — R2689 Other abnormalities of gait and mobility: Secondary | ICD-10-CM | POA: Diagnosis not present

## 2018-03-31 NOTE — Therapy (Signed)
[facility] [address] [place], [place], [address] Phone: [phone]   Fax:  [phone]  Physical Therapy Treatment  Patient Details  Name: Lindsay Travis MRN: [id number] Date of Birth: Feb 22, 1961 Referring Provider: Dorcas Mcmurray, MD   Encounter Date: 03/31/2018  PT End of Session - 03/31/18 1111    Visit Number  15    Number of Visits  17    Date for PT Re-Evaluation  04/02/18    Authorization Type  BCBS    Authorization Time Period  50% co-insurance,  30 visit limit combined PT, OT, chiropractor    Authorization - Visit Number  15    Authorization - Number of Visits  30    PT Start Time  1106    PT Stop Time  1145    PT Time Calculation (min)  39 min    Equipment Utilized During Treatment  Gait belt    Activity Tolerance  Patient tolerated treatment well;Patient limited by fatigue;No increased pain    Behavior During Therapy  WFL for tasks assessed/performed       Past Medical History:  Diagnosis Date  . Allergy   . Anemia   . Clotting disorder ([place])   . Diabetes mellitus    recent high blood sugar and has RX but not taking  med -b/c she doesn't think she  is diabetic, just had lots of sugar in her diet when  dx'd.  Marland Kitchen Hypertension   . Shortness of breath    on exertion- has low hgb    Past Surgical History:  Procedure Laterality Date  . ABDOMINAL HYSTERECTOMY  09/11/2011   Procedure: HYSTERECTOMY ABDOMINAL;  Surgeon: Frederico Hamman, MD;  Location: [facility];  Service: Gynecology;  Laterality: N/A;    There were no vitals filed for this visit.  Subjective Assessment - 03/31/18 1110    Subjective  No new complaints. Pt reports falling out of bed last night. Did not hit her head and was able to get herself up. No injury reported. Does continue to report heaviness in her legs at times.     Pertinent History  CVA, DM, HTN, obesity, chronic knee pain,     Limitations  Lifting;Standing;Walking    Patient  Stated Goals  to walk better, stop falling, get stronger in legs    Currently in Pain?  No/denies    Pain Score  0-No pain         [facility] PT Assessment - 03/31/18 1133      Functional Gait  Assessment   Gait assessed   Yes    Gait Level Surface  Walks 20 ft, slow speed, abnormal gait pattern, evidence for imbalance or deviates 10-15 in outside of the 12 in walkway width. Requires more than 7 sec to ambulate 20 ft.    Change in Gait Speed  Able to smoothly change walking speed without loss of balance or gait deviation. Deviate no more than 6 in outside of the 12 in walkway width.    Gait with Horizontal Head Turns  Performs head turns smoothly with slight change in gait velocity (eg, minor disruption to smooth gait path), deviates 6-10 in outside 12 in walkway width, or uses an assistive device.    Gait with Vertical Head Turns  Performs task with slight change in gait velocity (eg, minor disruption to smooth gait path), deviates 6 - 10 in outside 12 in walkway width or uses assistive device    Gait and Pivot  Turn  Pivot turns safely within 3 sec and stops quickly with no loss of balance.    Step Over Obstacle  Is able to step over 2 stacked shoe boxes taped together (9 in total height) without changing gait speed. No evidence of imbalance.    Gait with Narrow Base of Support  Is able to ambulate for 10 steps heel to toe with no staggering.    Gait with Eyes Closed  Walks 20 ft, slow speed, abnormal gait pattern, evidence for imbalance, deviates 10-15 in outside 12 in walkway width. Requires more than 9 sec to ambulate 20 ft.    Ambulating Backwards  Walks 20 ft, no assistive devices, good speed, no evidence for imbalance, normal gait    Steps  Alternating feet, no rail.    Total Score  24    FGA comment:  Initial FGA 14/30      Pt verbalized understanding of HEP and need for exercise program outside of PT.     PT Short Term Goals - 03/03/18 1157      PT SHORT TERM GOAL #1   Title   Patient is independent with initial HEP. (All STGs Target Date: 03/06/2018)    Baseline  MET 03/03/2018    Time  4    Period  Weeks    Status  Achieved      PT SHORT TERM GOAL #2   Title  Berg Balance >/=47/56    Baseline  MET 03/03/2018  Berg Balance 56/56    Time  4    Period  Weeks    Status  Achieved      PT SHORT TERM GOAL #3   Title  Functional Gait Assessment >/= 17/30    Baseline  MET 03/03/2018  FGA 20/30    Time  4    Period  Weeks    Status  Achieved        PT Long Term Goals - 03/31/18 1111      PT LONG TERM GOAL #1   Title  Patient demonstrates & verbalizes understanding of ongoing HEP / fitness plan. (All LTGs Target Date 04/02/2018)    Baseline  03/31/2018 Met today.    Time  8    Period  Weeks    Status  Achieved      PT LONG TERM GOAL #2   Title  Berg Balance >/= 52/56 to indicate lower fall risk.     Baseline  MET 03/03/2018  [name] 56/56    Time  8    Period  Weeks    Status  Achieved      PT LONG TERM GOAL #3   Title  Functional Gait Assessment >/= 24/30 to indicate lower fall risk.     Baseline  03/31/2018 Met today 24/30.    Time  8    Period  Weeks    Status  Achieved      PT LONG TERM GOAL #4   Title  Patient ambulates 1000' outdoors including grass, ramps, curbs & stairs (1 rail) modified independent.     Time  8    Period  Weeks    Status  On-going        Plan - 03/31/18 1152    Clinical Impression Statement  Today's session focused on addressing the LTGs for discharge and reinforcing the importance of an outside workout regiment at a local gym. Pt. has met all LTGs that have been checked thus far and the rest will be  checked next session. Plan to discharge this week.     Rehab Potential  Good    PT Frequency  2x / week    PT Duration  8 weeks    PT Treatment/Interventions  ADLs/Self Care Home Management;Canalith Repostioning;DME Instruction;Gait training;Stair training;Functional mobility training;Therapeutic activities;Therapeutic  exercise;Balance training;Neuromuscular re-education;Patient/family education;Vestibular    PT Next Visit Plan  Finish checking LTGs for discharge.    Consulted and Agree with Plan of Care  Patient       Patient will benefit from skilled therapeutic intervention in order to improve the following deficits and impairments:  Abnormal gait, Decreased activity tolerance, Decreased balance, Decreased endurance, Decreased mobility, Decreased range of motion, Decreased strength, Dizziness, Postural dysfunction, Pain  Visit Diagnosis: Other abnormalities of gait and mobility  Unsteadiness on feet  Muscle weakness (generalized)     Problem List Patient Active Problem List   Diagnosis Date Noted  . Cerebral thrombosis with cerebral infarction 01/10/2018  . Hyperlipidemia   . Lower extremity weakness 01/09/2018  . Abnormality of gait   . Type 2 diabetes mellitus with complication, without long-term current use of insulin ([place]) 08/07/2015  . Bilateral knee pain 05/26/2012  . Varicose veins of lower extremities with other complications 18/36/7255  . Fibroid, uterus 04/18/2011  . Metrorrhagia 04/18/2011  . Hypertension 04/18/2011    Arthor Captain, [place] 03/31/2018, 11:57 AM  [facility] [address] [place], [place], [address] Phone: [phone]   Fax:  [phone]  Name: Lindsay Travis MRN: [id number] Date of Birth: 1961/04/09

## 2018-04-02 ENCOUNTER — Encounter: Payer: Self-pay | Admitting: Physical Therapy

## 2018-04-02 ENCOUNTER — Ambulatory Visit: Payer: BLUE CROSS/BLUE SHIELD | Admitting: Physical Therapy

## 2018-04-02 DIAGNOSIS — R2681 Unsteadiness on feet: Secondary | ICD-10-CM | POA: Diagnosis not present

## 2018-04-02 DIAGNOSIS — M6281 Muscle weakness (generalized): Secondary | ICD-10-CM | POA: Diagnosis not present

## 2018-04-02 DIAGNOSIS — R2689 Other abnormalities of gait and mobility: Secondary | ICD-10-CM

## 2018-04-02 DIAGNOSIS — M25662 Stiffness of left knee, not elsewhere classified: Secondary | ICD-10-CM | POA: Diagnosis not present

## 2018-04-03 NOTE — Therapy (Signed)
[facility] [address] [place], [place], [address] Phone: [phone]   Fax:  [phone]  Physical Therapy Treatment  Patient Details  Name: Lindsay Travis MRN: [id number] Date of Birth: 07-16-1961 Referring Provider: Dorcas Mcmurray, MD   Encounter Date: 04/02/2018  PT End of Session - 04/02/18 1114    Visit Number  16    Number of Visits  17    Date for PT Re-Evaluation  04/02/18    Authorization Type  BCBS    Authorization Time Period  50% co-insurance,  30 visit limit combined PT, OT, chiropractor    Authorization - Visit Number  16    Authorization - Number of Visits  30    PT Start Time  1110 pt running late for appt today    PT Stop Time  1130 discharge visit, not all time was needed    PT Time Calculation (min)  20 min    Equipment Utilized During Treatment  Gait belt    Activity Tolerance  Patient tolerated treatment well;Patient limited by fatigue;No increased pain    Behavior During Therapy  WFL for tasks assessed/performed       Past Medical History:  Diagnosis Date  . Allergy   . Anemia   . Clotting disorder ([place])   . Diabetes mellitus    recent high blood sugar and has RX but not taking  med -b/c she doesn't think she  is diabetic, just had lots of sugar in her diet when  dx'd.  Marland Kitchen Hypertension   . Shortness of breath    on exertion- has low hgb    Past Surgical History:  Procedure Laterality Date  . ABDOMINAL HYSTERECTOMY  09/11/2011   Procedure: HYSTERECTOMY ABDOMINAL;  Surgeon: Frederico Hamman, MD;  Location: [facility];  Service: Gynecology;  Laterality: N/A;    There were no vitals filed for this visit.  Subjective Assessment - 04/02/18 1112    Subjective  Reports some arm soreness from aquatic exercises yesterday. Has also started walking around neighborhood. No falls.    Pertinent History  CVA, DM, HTN, obesity, chronic knee pain,     Limitations  Lifting;Standing;Walking    Patient  Stated Goals  to walk better, stop falling, get stronger in legs    Currently in Pain?  No/denies    Pain Score  0-No pain            [facility] Adult PT Treatment/Exercise - 04/02/18 1115      Transfers   Transfers  Sit to Stand;Stand to Sit    Sit to Stand  6: Modified independent (Device/Increase time)    Stand to Sit  6: Modified independent (Device/Increase time)      Ambulation/Gait   Ambulation/Gait  Yes    Ambulation/Gait Assistance  7: Independent    Ambulation/Gait Assistance Details  no balance issues, no foot scuffing noted with pt walking a steady to fast pace while carrying on a conversation    Ambulation Distance (Feet)  1000 Feet    Assistive device  None    Gait Pattern  Within Functional Limits    Ambulation Surface  Level;Unlevel;Indoor;Outdoor;Paved    Stairs  Yes    Stairs Assistance  6: Modified independent (Device/Increase time)    Stairs Assistance Details (indicate cue type and reason)  increase time with descent, no balance issues noted    Stair Management Technique  Two rails;Alternating pattern;Forwards    Number of Stairs  4  Ramp  7: Independent    Curb  7: Independent    Curb Details (indicate cue type and reason)  with 6 inch curb indoors            PT Short Term Goals - 03/03/18 1157      PT SHORT TERM GOAL #1   Title  Patient is independent with initial HEP. (All STGs Target Date: 03/06/2018)    Baseline  MET 03/03/2018    Time  4    Period  Weeks    Status  Achieved      PT SHORT TERM GOAL #2   Title  Berg Balance >/=47/56    Baseline  MET 03/03/2018  Berg Balance 56/56    Time  4    Period  Weeks    Status  Achieved      PT SHORT TERM GOAL #3   Title  Functional Gait Assessment >/= 17/30    Baseline  MET 03/03/2018  FGA 20/30    Time  4    Period  Weeks    Status  Achieved        PT Long Term Goals - 04/02/18 1114      PT LONG TERM GOAL #1   Title  Patient demonstrates & verbalizes understanding of ongoing HEP /  fitness plan. (All LTGs Target Date 04/02/2018)    Baseline  03/31/2018 Met today.    Status  Achieved      PT LONG TERM GOAL #2   Title  Berg Balance >/= 52/56 to indicate lower fall risk.     Baseline  MET 03/03/2018  Berg 56/56    Status  Achieved      PT LONG TERM GOAL #3   Title  Functional Gait Assessment >/= 24/30 to indicate lower fall risk.     Baseline  03/31/2018 Met today 24/30.    Status  Achieved      PT LONG TERM GOAL #4   Title  Patient ambulates 1000' outdoors including grass, ramps, curbs & stairs (1 rail) modified independent.     Baseline  04/02/18: met today    Status  Achieved            Plan - 04/02/18 1114    Clinical Impression Statement  Today's session focused on checking the remaining LTGs with all goals met. Pt is agreeable to discharge today with no questions.     Rehab Potential  Good    PT Frequency  2x / week    PT Duration  8 weeks    PT Treatment/Interventions  ADLs/Self Care Home Management;Canalith Repostioning;DME Instruction;Gait training;Stair training;Functional mobility training;Therapeutic activities;Therapeutic exercise;Balance training;Neuromuscular re-education;Patient/family education;Vestibular    PT Next Visit Plan  discharge per PT plan of care    Consulted and Agree with Plan of Care  Patient       Patient will benefit from skilled therapeutic intervention in order to improve the following deficits and impairments:  Abnormal gait, Decreased activity tolerance, Decreased balance, Decreased endurance, Decreased mobility, Decreased range of motion, Decreased strength, Dizziness, Postural dysfunction, Pain  Visit Diagnosis: Other abnormalities of gait and mobility  Unsteadiness on feet     Problem List Patient Active Problem List   Diagnosis Date Noted  . Cerebral thrombosis with cerebral infarction 01/10/2018  . Hyperlipidemia   . Lower extremity weakness 01/09/2018  . Abnormality of gait   . Type 2 diabetes mellitus  with complication, without long-term current use of insulin ([place]) 08/07/2015  .  Bilateral knee pain 05/26/2012  . Varicose veins of lower extremities with other complications 65/16/8610  . Fibroid, uterus 04/18/2011  . Metrorrhagia 04/18/2011  . Hypertension 04/18/2011    Willow Ora, PTA, [facility] [address], [place], [place] [address] [phone] 04/03/18, 1:00 PM   Name: Lindsay Travis MRN: [id number] Date of Birth: 1961-01-21

## 2018-04-06 ENCOUNTER — Encounter: Payer: Self-pay | Admitting: Physical Therapy

## 2018-04-06 NOTE — Therapy (Signed)
[facility] [address] [place], [place], [address] Phone: [phone]   Fax:  [phone]  Patient Details  Name: Lindsay Travis MRN: [id number] Date of Birth: 08/09/1961 Referring Provider:  Sara Neal, MD  Encounter Date: 04/06/2018  PHYSICAL THERAPY DISCHARGE SUMMARY  Visits from Start of Care: 16  Current functional level related to goals / functional outcomes: PT Long Term Goals - 04/02/18 1114      PT LONG TERM GOAL #1   Title  Patient demonstrates & verbalizes understanding of ongoing HEP / fitness plan. (All LTGs Target Date 04/02/2018)    Baseline  03/31/2018 Met today.    Status  Achieved      PT LONG TERM GOAL #2   Title  Berg Balance >/= 52/56 to indicate lower fall risk.     Baseline  MET 03/03/2018  Berg 56/56    Status  Achieved      PT LONG TERM GOAL #3   Title  Functional Gait Assessment >/= 24/30 to indicate lower fall risk.     Baseline  03/31/2018 Met today 24/30.    Status  Achieved      PT LONG TERM GOAL #4   Title  Patient ambulates 1000' outdoors including grass, ramps, curbs & stairs (1 rail) modified independent.     Baseline  04/02/18: met today    Status  Achieved         Remaining deficits: Patient continues to report fatigue with activities.   Education / Equipment: Ongoing HEP / Fitness Plan, Fall Prevention Strategies  Plan: Patient agrees to discharge.  Patient goals were met. Patient is being discharged due to meeting the stated rehab goals.  ?????         WALDRON,ROBIN  PT, DPT 04/06/2018, 1:32 PM   [facility] [address] [place], , [address] Phone: [phone]   Fax:  [phone] 

## 2018-04-11 ENCOUNTER — Other Ambulatory Visit: Payer: Self-pay | Admitting: Family Medicine

## 2018-04-15 ENCOUNTER — Ambulatory Visit (INDEPENDENT_AMBULATORY_CARE_PROVIDER_SITE_OTHER): Payer: BLUE CROSS/BLUE SHIELD | Admitting: Neurology

## 2018-04-15 ENCOUNTER — Encounter

## 2018-04-15 ENCOUNTER — Encounter: Payer: Self-pay | Admitting: Neurology

## 2018-04-15 VITALS — BP 125/82 | HR 69 | Ht 65.0 in | Wt 241.0 lb

## 2018-04-15 DIAGNOSIS — Z8673 Personal history of transient ischemic attack (TIA), and cerebral infarction without residual deficits: Secondary | ICD-10-CM

## 2018-04-15 DIAGNOSIS — G478 Other sleep disorders: Secondary | ICD-10-CM

## 2018-04-15 DIAGNOSIS — R351 Nocturia: Secondary | ICD-10-CM | POA: Diagnosis not present

## 2018-04-15 DIAGNOSIS — R0683 Snoring: Secondary | ICD-10-CM

## 2018-04-15 DIAGNOSIS — Z6841 Body Mass Index (BMI) 40.0 and over, adult: Secondary | ICD-10-CM

## 2018-04-15 DIAGNOSIS — R5383 Other fatigue: Secondary | ICD-10-CM

## 2018-04-15 NOTE — Progress Notes (Signed)
Subjective:    Patient ID: Lindsay Travis is a 57 y.o. female.  HPI     Star Age, MD, PhD [facility] [address], [address] [place], [place] [address]  Dear Lindsay Travis,   I saw your patient, Lindsay Travis, upon your kind request in my clinic today for initial consultation of her sleep disorder, in particular, concern for underlying obstructive sleep apnea. The patient is unaccompanied today. As you know, [name] is a 57 year old right-handed woman with an underlying medical history of anemia, allergies, diabetes, hypertension, clotting disorder, right basal ganglia infarct in May 2019 and morbid obesity with a BMI of over 40, who reports snoring and feeling tired since her stroke. I reviewed your office note from 03/20/2018. Her Epworth sleepiness score is 1/24, fatigue score of 53/63.  For about a month after the stroke, she was sleepy during the day, and took naps, but not now. She has a bedtime between 10-11 PM and rise time is around 7-8 AM. She has significant nocturia of 4/night, denies AM HAs, denies a FHx of OSA. She is a NS, does not drink EtOH and does not drink caffeine daily. She is married and lives with her husband and 3 of her 4 children. She does not watch TV in her bedroom. She has maintained weight.   Her Past Medical History Is Significant For: Past Medical History:  Diagnosis Date  . Allergy   . Anemia   . Clotting disorder ([place])   . Diabetes mellitus    recent high blood sugar and has RX but not taking  med -b/c she doesn't think she  is diabetic, just had lots of sugar in her diet when  dx'd.  Marland Kitchen Hypertension   . Shortness of breath    on exertion- has low hgb    Her Past Surgical History Is Significant For: Past Surgical History:  Procedure Laterality Date  . ABDOMINAL HYSTERECTOMY  09/11/2011   Procedure: HYSTERECTOMY ABDOMINAL;  Surgeon: Frederico Hamman, MD;  Location: [facility];  Service: Gynecology;  Laterality: N/A;     Her Family History Is Significant For: Family History  Problem Relation Age of Onset  . Hypertension Other     Her Social History Is Significant For: Social History   Socioeconomic History  . Marital status: Divorced    Spouse name: Not on file  . Number of children: Not on file  . Years of education: Not on file  . Highest education level: Not on file  Occupational History  . Not on file  Social Needs  . Financial resource strain: Not on file  . Food insecurity:    Worry: Not on file    Inability: Not on file  . Transportation needs:    Medical: Not on file    Non-medical: Not on file  Tobacco Use  . Smoking status: Never Smoker  . Smokeless tobacco: Never Used  Substance and Sexual Activity  . Alcohol use: No  . Drug use: No  . Sexual activity: Never  Lifestyle  . Physical activity:    Days per week: Not on file    Minutes per session: Not on file  . Stress: Not on file  Relationships  . Social connections:    Talks on phone: Not on file    Gets together: Not on file    Attends religious service: Not on file    Active member of club or organization: Not on file    Attends meetings  of clubs or organizations: Not on file    Relationship status: Not on file  Other Topics Concern  . Not on file  Social History Narrative  . Not on file    Her Allergies Are:  Allergies  Allergen Reactions  . Lisinopril Other (See Comments)    Did not feel good  . Losartan Other (See Comments)    headache  . Aspirin Other (See Comments)    Abdominal pain  :   Her Current Medications Are:  Outpatient Encounter Medications as of 04/15/2018  Medication Sig  . ACCU-CHEK GUIDE test strip USE TO CHECK FASTING BLOOD GLUCOSE ONCE DAILY  . amLODipine (NORVASC) 5 MG tablet Take 1 tablet (5 mg total) by mouth daily.  Marland Kitchen aspirin 81 MG EC tablet Take 1 tablet (81 mg total) by mouth daily.  . blood glucose meter kit and supplies KIT Please check fasting blood sugar once daily.  .  clopidogrel (PLAVIX) 75 MG tablet Take 1 tablet (75 mg total) by mouth daily.  Marland Kitchen glipiZIDE (GLUCOTROL) 5 MG tablet Take 1 tablet (5 mg total) by mouth 2 (two) times daily before a meal.  . hydrochlorothiazide (HYDRODIURIL) 25 MG tablet Take 1 tablet (25 mg total) by mouth daily.  . rosuvastatin (CRESTOR) 20 MG tablet Take 1 tablet (20 mg total) by mouth daily.   No facility-administered encounter medications on file as of 04/15/2018.   :  Review of Systems:  Out of a complete 14 point review of systems, all are reviewed and negative with the exception of these symptoms as listed below: Review of Systems  Neurological:       Patient referred by Lindsay Billow, NP. Patient complaining of fatigue since stroke.   Epworth Sleepiness Scale 0= would never doze 1= slight chance of dozing 2= moderate chance of dozing 3= high chance of dozing  Sitting and reading:0 Watching TV:0 Sitting inactive in a public place (ex. Theater or meeting):0 As a passenger in a car for an hour without a break:0 Lying down to rest in the afternoon:0 Sitting and talking to someone:0 Sitting quietly after lunch (no alcohol):1 In a car, while stopped in traffic:0 Total:1     Objective:  Neurological Exam  Physical Exam Physical Examination:   Vitals:   04/15/18 0904  BP: 125/82  Pulse: 69    General Examination: The patient is a very pleasant 57 y.o. female in no acute distress. She appears well-developed and well-nourished and well groomed.   HEENT: Normocephalic, atraumatic, pupils are equal, round and reactive to light and accommodation. Extraocular tracking is good without limitation to gaze excursion or nystagmus noted. Normal smooth pursuit is noted. Hearing is grossly intact. Face is symmetric with normal facial animation and normal facial sensation. Speech is clear with no dysarthria noted. There is no hypophonia. There is no lip, neck/head, jaw or voice tremor. Neck is supple with full range of passive  and active motion. There are no carotid bruits on auscultation. Oropharynx exam reveals: mild mouth dryness, adequate dental hygiene and no significant airway crowding. Mallampati is class I. Tongue protrudes centrally and palate elevates symmetrically. Tonsils are small. Neck circumference is 14-5/8 inches. She has a mild to moderate overbite.   Chest: Clear to auscultation without wheezing, rhonchi or crackles noted.  Heart: S1+S2+0, regular and normal without murmurs, rubs or gallops noted.   Abdomen: Soft, non-tender and non-distended with normal bowel sounds appreciated on auscultation.  Extremities: There is no pitting edema in the distal lower extremities bilaterally. Marland Kitchen  Skin: Warm and dry without trophic changes noted.  Musculoskeletal: exam reveals no obvious joint deformities, tenderness or joint swelling or erythema.   Neurologically:  Mental status: The patient is awake, alert and oriented in all 4 spheres. Her immediate and remote memory, attention, language skills and fund of knowledge are appropriate. There is no evidence of aphasia, agnosia, apraxia or anomia. Speech is clear with normal prosody and enunciation. Thought process is linear. Mood is constricted and affect is blunted.  Cranial nerves II - XII are as described above under HEENT exam.   Motor exam: Normal bulk, strength and tone is noted, with the exception of minimal weakness in the left hip flexor noted. There is no tremor. Reflexes are symmetrical. Fine motor skills are globally intact. Cerebellar testing shows no dysmetria or intention tremor on finger to nose testing. Sensory exam: intact to light touch.  Gait, station and balance: She stands easily. No veering to one side is noted. No leaning to one side is noted. Posture is age-appropriate and stance is narrow based. Gait shows normal stride length and normal pace. No problems turning are noted.   Assessment and Plan:  In summary, Suzann Lazaro is a very  pleasant 57 y.o.-year old female  with an underlying medical history of anemia, allergies, diabetes, hypertension, clotting disorder, right basal ganglia infarct in May 2019 and morbid obesity with a BMI of over 40, whose history and physical exam are concerning for obstructive sleep apnea (OSA). Especially in light of her stroke and her complaints of feeling tired and severe nocturia, in the context of severe obesity, obstructive sleep apnea should be excluded and treatment should be considered if OSA is confirmed. I had a long chat with the patient about my findings and the diagnosis of OSA, its prognosis and treatment options. We talked about medical treatments, surgical interventions and non-pharmacological approaches. I explained in particular the risks and ramifications of untreated moderate to severe OSA, especially with respect to developing cardiovascular disease down the Road, including congestive heart failure, difficult to treat hypertension, cardiac arrhythmias, or stroke. Even type 2 diabetes has, in part, been linked to untreated OSA. Symptoms of untreated OSA include daytime sleepiness, memory problems, mood irritability and mood disorder such as depression and anxiety, lack of energy, as well as recurrent headaches, especially morning headaches. We talked about trying to maintain a healthy lifestyle in general, as well as the importance of weight control. I encouraged the patient to eat healthy, exercise daily and keep well hydrated, to keep a scheduled bedtime and wake time routine, to not skip any meals and eat healthy snacks in between meals. I advised the patient not to drive when feeling sleepy. I recommended the following at this time: sleep study with potential positive airway pressure titration. (We will score hypopneas at 3%).   I explained the sleep test procedure to the patient and also outlined possible surgical and non-surgical treatment options of OSA, including the use of a  custom-made dental device (which would require a referral to a specialist dentist or oral surgeon), upper airway surgical options, such as pillar implants, radiofrequency surgery, tongue base surgery, and UPPP (which would involve a referral to an ENT surgeon). Rarely, jaw surgery such as mandibular advancement may be considered.  I also explained the CPAP treatment option to the patient, who indicated that she would be willing to try CPAP if the need arises. I explained the importance of being compliant with PAP treatment, not only for insurance purposes but  primarily to improve Her symptoms, and for the patient's long term health benefit, including to reduce Her cardiovascular risks. I answered all her questions today and the patient was in agreement. I plan to see her back after the sleep study is completed and encouraged her to call with any interim questions, concerns, problems or updates.   Thank you very much for allowing me to participate in the care of this nice patient. If I can be of any further assistance to you please do not hesitate to call me at [phone].  Sincerely,   Star Age, MD, PhD

## 2018-04-15 NOTE — Patient Instructions (Signed)

## 2018-04-17 ENCOUNTER — Telehealth: Payer: Self-pay | Admitting: Urgent Care

## 2018-04-17 NOTE — Telephone Encounter (Signed)
Called pt to let them know we would need to reschedule their appt 06/05/18. Rescheduled

## 2018-04-20 DIAGNOSIS — E119 Type 2 diabetes mellitus without complications: Secondary | ICD-10-CM | POA: Diagnosis not present

## 2018-04-20 DIAGNOSIS — H35033 Hypertensive retinopathy, bilateral: Secondary | ICD-10-CM | POA: Diagnosis not present

## 2018-04-20 DIAGNOSIS — H04123 Dry eye syndrome of bilateral lacrimal glands: Secondary | ICD-10-CM | POA: Diagnosis not present

## 2018-05-06 ENCOUNTER — Ambulatory Visit (INDEPENDENT_AMBULATORY_CARE_PROVIDER_SITE_OTHER): Payer: BLUE CROSS/BLUE SHIELD | Admitting: Neurology

## 2018-05-06 DIAGNOSIS — R5383 Other fatigue: Secondary | ICD-10-CM

## 2018-05-06 DIAGNOSIS — G4733 Obstructive sleep apnea (adult) (pediatric): Secondary | ICD-10-CM | POA: Diagnosis not present

## 2018-05-06 DIAGNOSIS — R351 Nocturia: Secondary | ICD-10-CM

## 2018-05-06 DIAGNOSIS — G4720 Circadian rhythm sleep disorder, unspecified type: Secondary | ICD-10-CM

## 2018-05-06 DIAGNOSIS — Z8673 Personal history of transient ischemic attack (TIA), and cerebral infarction without residual deficits: Secondary | ICD-10-CM

## 2018-05-06 DIAGNOSIS — R0683 Snoring: Secondary | ICD-10-CM

## 2018-05-06 DIAGNOSIS — G478 Other sleep disorders: Secondary | ICD-10-CM

## 2018-05-06 DIAGNOSIS — Z6841 Body Mass Index (BMI) 40.0 and over, adult: Secondary | ICD-10-CM

## 2018-05-07 ENCOUNTER — Telehealth: Payer: Self-pay

## 2018-05-07 DIAGNOSIS — G4733 Obstructive sleep apnea (adult) (pediatric): Secondary | ICD-10-CM

## 2018-05-07 NOTE — Telephone Encounter (Signed)
I called pt. I advised pt that Dr. Rexene Alberts reviewed their sleep study results and found that pt has mild osa. Dr. Rexene Alberts recommends that pt start an auto pap at home. I reviewed PAP compliance expectations with the pt. Pt is agreeable to starting an auto-PAP. I advised pt that an order will be sent to a DME, [facility], and [facility] will call the pt within about one week after they file with the pt's insurance. [facility] will show the pt how to use the machine, fit for masks, and troubleshoot the auto-PAP if needed. A follow up appt was made for insurance purposes with Jinny Blossom, NP on 06/12/2018 at 9:30am. I reminded pt to also keep her appt with [name], NP on 06/22/18 for her stroke follow up. Pt verbalized understanding to arrive 15 minutes early and bring their auto-PAP. A letter with all of this information in it will be mailed to the pt as a reminder. I verified with the pt that the address we have on file is correct. Pt verbalized understanding of results. Pt had no questions at this time but was encouraged to call back if questions arise.  Unfortunately, [name], NP cannot see cpap patients at this time. Jinny Blossom, NP was agreeable to seeing this pt on Friday, October 4th, 2019.

## 2018-05-07 NOTE — Telephone Encounter (Signed)
I called pt to discuss her sleep study. No answer, left a message asking her to call me back. 

## 2018-05-07 NOTE — Procedures (Signed)
PATIENT'S NAME:  Lindsay Travis, Lindsay Travis DOB:      1961-02-03      MR#:    [id number]     DATE OF RECORDING: 05/06/2018 REFERRING M.D.:  Venancio Poisson, NP Study Performed:   Baseline Polysomnogram HISTORY: 57 year old woman with a history of anemia, allergies, diabetes, hypertension, clotting disorder, right basal ganglia infarct in May 2019 and morbid obesity, who reports snoring and feeling tired. The patient endorsed the Epworth Sleepiness Scale at 1/24 points. The patient's weight 241 pounds with a height of 65 (inches), resulting in a BMI of 40. kg/m2. The patient's neck circumference measured 14.5 inches.  CURRENT MEDICATIONS: Accu-Chek Guide, Norvasc, Aspirin, Plavix, Glucotrol, Hydrodiuril, Crestor.   PROCEDURE:  This is a multichannel digital polysomnogram utilizing the Somnostar 11.2 system.  Electrodes and sensors were applied and monitored per AASM Specifications.   EEG, EOG, Chin and Limb EMG, were sampled at 200 Hz.  ECG, Snore and Nasal Pressure, Thermal Airflow, Respiratory Effort, CPAP Flow and Pressure, Oximetry was sampled at 50 Hz. Digital video and audio were recorded.      BASELINE STUDY  Lights Out was at 21:34 and Lights On at 04:44.  Total recording time (TRT) was 430 minutes, with a total sleep time (TST) of  355.5 minutes.   The patient's sleep latency was 20 minutes, which is normal. REM latency was 149.5 minutes, which is delayed.  The sleep efficiency was 82.7 %.     SLEEP ARCHITECTURE: WASO (Wake after sleep onset) was 53.5 minutes with moderate sleep fragmentation noted. There were 18.5 minutes in Stage N1, 192.5 minutes Stage N2, 109.5 minutes Stage N3 and 35 minutes in Stage REM.  The percentage of Stage N1 was 5.2%, Stage N2 was 54.1%, Stage N3 was 30.8%, which is increased, and Stage R (REM sleep) was 9.8%, which is reduced. The arousals were noted as: 58 were spontaneous, 15 were associated with PLMs, 21 were associated with respiratory events.  RESPIRATORY ANALYSIS:   There were a total of 60 respiratory events:  4 obstructive apneas, 1 central apneas and 0 mixed apneas with a total of 5 apneas and an apnea index (AI) of .8 /hour. There were 55 hypopneas with a hypopnea index of 9.3 /hour. The patient also had 0 respiratory event related arousals (RERAs).      The total APNEA/HYPOPNEA INDEX (AHI) was 10.1/hour and the total RESPIRATORY DISTURBANCE INDEX was  10.1 /hour.  14 events occurred in REM sleep and 83 events in NREM. The REM AHI was 24 /hour, versus a non-REM AHI of 8.6. The patient spent 355.5 minutes of total sleep time in the supine position and 0 minutes in non-supine.. The supine AHI was 10.1 versus a non-supine AHI of n/a.  OXYGEN SATURATION & C02:  The Wake baseline 02 saturation was 96%, with the lowest being 86%. Time spent below 89% saturation equaled 4 minutes.  PERIODIC LIMB MOVEMENTS: The patient had a total of 39 Periodic Limb Movements.  The Periodic Limb Movement (PLM) index was 6.6 and the PLM Arousal index was 2.5/hour. Audio and video analysis did not show any abnormal or unusual movements, behaviors, phonations or vocalizations. The patient took two bathroom breaks. Moderate to loud snoring was noted. The EKG was in keeping with normal sinus rhythm (NSR).  Post-study, the patient indicated that sleep was worse than usual.   POLYSOMNOGRAPHY IMPRESSION :   1. Obstructive Sleep Apnea (OSA)  2. Dysfunctions associated with sleep stages or arousals from sleep  RECOMMENDATIONS:  1. This  study demonstrates overall mild obstructive sleep apnea, moderate during REM sleep with a total AHI of 10.1/hour, REM AHI of 24/hour, and O2 nadir of 86%. Given the patient's medical history and sleep related complaints, a full-night CPAP titration study is recommended to optimize therapy. Other treatment options may include avoidance of supine sleep position along with weight loss, upper airway or jaw surgery in selected patients or the use of an oral  appliance in certain patients. ENT evaluation and/or consultation with a maxillofacial surgeon or dentist may be feasible in some instances. Please note that untreated obstructive sleep apnea may carry additional perioperative morbidity. Patients with significant obstructive sleep apnea should receive perioperative PAP therapy and the surgeons and particularly the anesthesiologist should be informed of the diagnosis and the severity of the sleep disordered breathing. 2. This study shows sleep fragmentation and abnormal sleep stage percentages; these are nonspecific findings and per se do not signify an intrinsic sleep disorder or a cause for the patient's sleep-related symptoms. Causes include (but are not limited to) the first night effect of the sleep study, circadian rhythm disturbances, medication effect or an underlying mood disorder or medical problem.  3. The patient should be cautioned not to drive, work at heights, or operate dangerous or heavy equipment when tired or sleepy. Review and reiteration of good sleep hygiene measures should be pursued with any patient. 4. The patient will be seen in follow-up in the sleep clinic at [facility] for discussion of the test results, symptom and treatment compliance review, further management strategies, etc. The referring provider will be notified of the test results.  I certify that I have reviewed the entire raw data recording prior to the issuance of this report in accordance with the Standards of Accreditation of the American Academy of Sleep Medicine (AASM)   Star Age, MD, PhD Diplomat, American Board of Neurology and Sleep Medicine (Neurology and Sleep Medicine)

## 2018-05-07 NOTE — Telephone Encounter (Signed)
Upon further review: I would recommend pt start a trial of autoPAP at home, as overall AHI was in the mild range and desaturations were not critical.  To that end I recommend treatment for this in the form of autoPAP, which means, that we don't have to bring her back for a second sleep study with CPAP, but will let her try an autoPAP machine at home, through a DME company (of her choice, or as per insurance requirement). The DME representative will educate her on how to use the machine, how to put the mask on, etc. I have placed an order in the chart. Please send referral, talk to patient, send report to referring MD. We will need a FU in sleep clinic for 10 weeks post-PAP set up, please arrange that with me or one of our NPs. In fact, she can keep appt with [name], which is already scheduled.

## 2018-05-07 NOTE — Addendum Note (Signed)
Addended by: Star Age on: 05/07/2018 08:36 AM   Modules accepted: Orders

## 2018-05-07 NOTE — Telephone Encounter (Signed)
-----   Message from Star Age, MD sent at 05/07/2018  8:36 AM EDT ----- Patient referred by Gertie Gowda, seen by me on 04/15/18, diagnostic PSG on 05/06/18.   Please call and notify the patient that the recent sleep study showed obstructive sleep apnea. I recommend treatment for this in the form of CPAP, esp in light of Hx of stroke. This will require a repeat sleep study for proper titration and mask fitting and correct monitoring of the oxygen saturations. Please explain to patient. I have placed an order in the chart. Thanks.  Star Age, MD, PhD [facility])

## 2018-05-07 NOTE — Progress Notes (Signed)
Patient referred by Gertie Gowda, seen by me on 04/15/18, diagnostic PSG on 05/06/18.   Please call and notify the patient that the recent sleep study showed obstructive sleep apnea. I recommend treatment for this in the form of CPAP, esp in light of Hx of stroke. This will require a repeat sleep study for proper titration and mask fitting and correct monitoring of the oxygen saturations. Please explain to patient. I have placed an order in the chart. Thanks.  Star Age, MD, PhD [facility])

## 2018-05-14 ENCOUNTER — Institutional Professional Consult (permissible substitution): Payer: BLUE CROSS/BLUE SHIELD | Admitting: Neurology

## 2018-06-03 DIAGNOSIS — G4733 Obstructive sleep apnea (adult) (pediatric): Secondary | ICD-10-CM | POA: Diagnosis not present

## 2018-06-04 ENCOUNTER — Encounter: Payer: Self-pay | Admitting: Urgent Care

## 2018-06-04 ENCOUNTER — Other Ambulatory Visit: Payer: Self-pay

## 2018-06-04 ENCOUNTER — Ambulatory Visit (INDEPENDENT_AMBULATORY_CARE_PROVIDER_SITE_OTHER): Payer: BLUE CROSS/BLUE SHIELD | Admitting: Urgent Care

## 2018-06-04 VITALS — BP 112/77 | HR 78 | Temp 98.8°F | Resp 18 | Ht 65.0 in | Wt 242.0 lb

## 2018-06-04 DIAGNOSIS — R5383 Other fatigue: Secondary | ICD-10-CM

## 2018-06-04 DIAGNOSIS — I10 Essential (primary) hypertension: Secondary | ICD-10-CM

## 2018-06-04 DIAGNOSIS — Z23 Encounter for immunization: Secondary | ICD-10-CM | POA: Diagnosis not present

## 2018-06-04 DIAGNOSIS — E119 Type 2 diabetes mellitus without complications: Secondary | ICD-10-CM

## 2018-06-04 LAB — POCT GLYCOSYLATED HEMOGLOBIN (HGB A1C): HEMOGLOBIN A1C: 7.1 % — AB (ref 4.0–5.6)

## 2018-06-04 MED ORDER — HYDROCHLOROTHIAZIDE 25 MG PO TABS: 25.0000 mg | ORAL_TABLET | Freq: Every day | ORAL | 1 refills | Status: DC

## 2018-06-04 MED ORDER — CLOPIDOGREL BISULFATE 75 MG PO TABS: 75.0000 mg | ORAL_TABLET | Freq: Every day | ORAL | 3 refills | Status: DC

## 2018-06-04 MED ORDER — AMLODIPINE BESYLATE 5 MG PO TABS: 5.0000 mg | ORAL_TABLET | Freq: Every day | ORAL | 1 refills | Status: DC

## 2018-06-04 MED ORDER — DOCUSATE SODIUM 50 MG PO CAPS: 50.0000 mg | ORAL_CAPSULE | Freq: Two times a day (BID) | ORAL | 5 refills | Status: DC

## 2018-06-04 NOTE — Patient Instructions (Addendum)
Please check your blood pressure once weekly. Try to check the blood pressure around the same time each day that you check it after a period of resting in a seated position for 5-10 minutes. The readings should be between 615-379 systolic (top number), between 43-27 diastolic (bottom number). If your blood pressure falls outside this range consistently (i.e. 3-4 weeks in a row), for instance in the 140s or higher, then start taking your hydrochlorothiazide again. For now, only take 5mg  amlodipine. Stop taking the cholesterol medications. Start taking an over-the-counter Omega-3 fatty acid supplement.     [facility] [address], [place], [place] [address]  [phone] I'll be starting in June 09, 2018.     If you have lab work done today you will be contacted with your lab results within the next 2 weeks.  If you have not heard from [place] then please contact us. The fastest way to get your results is to register for My Chart.   IF you received an x-ray today, you will receive an invoice from [facility]. Please contact [facility] at [phone] with questions or concerns regarding your invoice.   IF you received labwork today, you will receive an invoice from [facility]. Please contact [facility] at [phone] with questions or concerns regarding your invoice.   Our billing staff will not be able to assist you with questions regarding bills from these companies.  You will be contacted with the lab results as soon as they are available. The fastest way to get your results is to activate your My Chart account. Instructions are located on the last page of this paperwork. If you have not heard from [place] regarding the results in 2 weeks, please contact this office.

## 2018-06-04 NOTE — Progress Notes (Signed)
   MRN: [id number]  Subjective:   Lindsay Travis is a 57 y.o. female who presents for follow up of HTN, Type 2 Diabetes Mellitus.  Patient states that she is doing much better overall.  She has been out of her hydrochlorothiazide for the past 3 days.  Currently takes amlodipine at 5 mg.  Reports that her fatigue and dizziness is much improved.  She does have myalgia in her legs and feet from using Crestor.  Lipitor was worse with the side effects.  She is trying to maintain a healthy lifestyle, eats healthfully and exercises regularly by walking.  She would like medication for constipation, has about 1 bowel movement per week.  She is taking aspirin, clopidogrel and glipizide consistently.  reports that she has never smoked. She has never used smokeless tobacco. She reports that she does not drink alcohol or use drugs.   Lindsay Travis has a current medication list which includes the following prescription(s): accu-chek guide, amlodipine, aspirin, blood glucose meter kit and supplies, clopidogrel, glipizide, hydrochlorothiazide, and rosuvastatin. Also is allergic to lisinopril; losartan; and aspirin.  Lindsay Travis  has a past medical history of Allergy, Anemia, Clotting disorder ([place]), Diabetes mellitus, Hypertension, and Shortness of breath. Also  has a past surgical history that includes Abdominal hysterectomy (09/11/2011).  Objective:   PHYSICAL EXAM BP 112/77   Pulse 78   Temp 98.8 F (37.1 C) (Oral)   Resp 18   Ht '5\' 5"'$  (1.651 m)   Wt 242 lb (109.8 kg)   LMP 09/10/2011   SpO2 98%   BMI 40.27 kg/m   BP Readings from Last 3 Encounters:  06/04/18 112/77  04/15/18 125/82  03/20/18 134/89   Wt Readings from Last 3 Encounters:  06/04/18 242 lb (109.8 kg)  04/15/18 241 lb (109.3 kg)  03/20/18 243 lb (110.2 kg)   Physical Exam  Constitutional: She is oriented to person, place, and time. She appears well-developed and well-nourished.  Cardiovascular: Normal rate.  Pulmonary/Chest: Effort  normal.  Neurological: She is alert and oriented to person, place, and time.   Results for orders placed or performed in visit on 06/04/18 (from the past 24 hour(s))  POCT glycosylated hemoglobin (Hb A1C)     Status: Abnormal   Collection Time: 06/04/18 10:37 AM  Result Value Ref Range   Hemoglobin A1C 7.1 (A) 4.0 - 5.6 %   HbA1c POC (<> result, manual entry)     HbA1c, POC (prediabetic range)     HbA1c, POC (controlled diabetic range)       Assessment and Plan :   Essential hypertension  Other fatigue  Morbid obesity (HCC)  Type 2 diabetes mellitus without complication, without long-term current use of insulin ([place]) - Plan: POCT glycosylated hemoglobin (Hb A1C)  Need for prophylactic vaccination and inoculation against influenza  Patient is doing very well.  I refilled her medications except for the statins.  She is to maintain amlodipine 5 mg but will hold off on hydrochlorothiazide.  Blood pressure monitoring was reviewed including instructions to restart hydrochlorothiazide.  For constipation she is to start a stool softener, maintain her level of exercise and hydration.  Counseled on management of this as well. Counseled patient on potential for adverse effects with medications prescribed today, patient verbalized understanding. Return-to-clinic precautions discussed, patient verbalized understanding.  Follow up in about 3 months.  Jaynee Eagles, PA-C [facility] at [facility] [phone] 06/04/2018 10:05 AM

## 2018-06-04 NOTE — Addendum Note (Signed)
Addended by: Raymon Mutton on: 06/04/2018 12:32 PM   Modules accepted: Orders

## 2018-06-04 NOTE — Telephone Encounter (Signed)
I called pt. Jinny Blossom, NP has asked if pt will come on 06/18/18 at either 11:00 or 11:30am instead of 06/12/18. No answer, left a message asking her to call me back. If pt calls back, please discuss this with her.

## 2018-06-05 ENCOUNTER — Ambulatory Visit: Payer: BLUE CROSS/BLUE SHIELD | Admitting: Urgent Care

## 2018-06-05 NOTE — Telephone Encounter (Signed)
Pt just started cpap on 06/03/18. Her appt needs to be moved to November.  I called pt again to discuss. Pt is agreeable to cancelling her 06/12/18 appt and moving it until 07/30/18 at 10:30am with Jinny Blossom, NP, check in at 10:00am. Pt verbalized understanding of new appt date and time.

## 2018-06-12 ENCOUNTER — Ambulatory Visit: Payer: Self-pay | Admitting: Adult Health

## 2018-06-22 ENCOUNTER — Encounter: Payer: Self-pay | Admitting: Adult Health

## 2018-06-22 ENCOUNTER — Ambulatory Visit: Payer: BLUE CROSS/BLUE SHIELD | Admitting: Adult Health

## 2018-06-22 VITALS — BP 141/96 | HR 67 | Ht 65.0 in | Wt 243.8 lb

## 2018-06-22 DIAGNOSIS — I10 Essential (primary) hypertension: Secondary | ICD-10-CM

## 2018-06-22 DIAGNOSIS — E118 Type 2 diabetes mellitus with unspecified complications: Secondary | ICD-10-CM | POA: Diagnosis not present

## 2018-06-22 DIAGNOSIS — I6330 Cerebral infarction due to thrombosis of unspecified cerebral artery: Secondary | ICD-10-CM

## 2018-06-22 DIAGNOSIS — E785 Hyperlipidemia, unspecified: Secondary | ICD-10-CM | POA: Diagnosis not present

## 2018-06-22 DIAGNOSIS — G4733 Obstructive sleep apnea (adult) (pediatric): Secondary | ICD-10-CM

## 2018-06-22 NOTE — Patient Instructions (Addendum)
Continue aspirin 81 mg daily for secondary stroke prevention  Stop plavix at this time and continue aspirin 81mg  only  We will check cholesterol levels today and we will call with you with possibly starting Zetia or Repatha for cholesterol management  Continue to follow up with PCP regarding cholesterol, blood pressure and diabetes management. Also follow up with PCP regarding continued right foot pain  Back pain - use tylenol as needed for pain along with heat/ice and stretches. If the pain does not resolve within 1 week or worsens, follow up with primary doctor  Continue to use CPAP and follow up with Dr Myra Rude, NP for management   Increase activity along with ensuring you maintain a healthy diet and adequate fluids   Continue to monitor blood pressure at home  Maintain strict control of hypertension with blood pressure goal below 130/90, diabetes with hemoglobin A1c goal below 6.5% and cholesterol with LDL cholesterol (bad cholesterol) goal below 70 mg/dL. I also advised the patient to eat a healthy diet with plenty of whole grains, cereals, fruits and vegetables, exercise regularly and maintain ideal body weight.  Followup in the future with me in 6 months or call earlier if needed       Thank you for coming to see [place] at [facility]. I hope we have been able to provide you high quality care today.  You may receive a patient satisfaction survey over the next few weeks. We would appreciate your feedback and comments so that we may continue to improve ourselves and the health of our patients.

## 2018-06-22 NOTE — Progress Notes (Signed)
I agree with the above plan 

## 2018-06-22 NOTE — Progress Notes (Signed)
[facility] [address] [place] [address] [phone]       OFFICE FOLLOW UP NOTE  [name] Date of Birth:  April 12, 1961 Medical Record Number:  [id number]   Reason for visit: Stroke follow up  CHIEF COMPLAINT:  Chief Complaint  Patient presents with  . Follow-up    Cerebral thrombosis with cerebral infarction room in back hallway pt alone    HPI: Lindsay Travis is being seen today in the office for right BG/CR infarct on 01/09/2018. History obtained from patient and chart review. Reviewed all radiology images and labs personally.  Lindsay Travis is a 57 y.o. female with history of DM and HTN who was admitted for imbalanced gait. No tPA given due to OSW.  MRI reviewed and showed right BG/CR infarct.  CTA head and neck showed a left M1 and A1 moderate stenosis.  2D echo showed EF of 60 to 65% without evidence of PFO.  LDL 130 as patient was not on statin PTA is recommended to start Lipitor 40 mg.  A1c elevated at 8.0 and recommended tight glycemic control with close PCP follow-up.  Patient was not on antithrombotic PTA and recommended DAPT for 3 weeks on aspirin alone.  Therapy recommended outpatient PT and was discharged in stable condition.  03/20/2018 visit: Patient is being seen today for hospital follow-up and from a stroke standpoint is doing well.  She continues to participate in physical therapy at our neuro rehab clinic [place] for mild and balance issues.  She continues to take aspirin without side effects of bleeding or bruising.  She continues to take Lipitor but does state that she feels constant "cramping" throughout her body since she has left the hospital.  Blood pressure satisfactory 134/89.  Patient does also have complaints of excessive daytime fatigue.  She states prior to the stroke she had problems with insomnia but after the stroke she states she can sleep well at night but feels as though if she sits during the day, she will  fall asleep and can nap easily.  She also states that towards the end of the day, with this increased fatigue is also makes her balance slightly worse and feels a "heaviness" in her legs.  Patient does have a history of anemia and has also been following up with PCP in regards to these complaints of fatigue.  Patient is attempting to stay active but due to excessive fatigue and muscle cramping this has been a challenge for her.  Patient did return to all previous activities such as driving and working.  Denies new or worsening stroke/TIA symptoms.  Interval history 06/22/2018: Patient is being seen today for scheduled follow-up visit.  After prior appointment, referral was placed for OSA work-up and was seen by Dr. Rexene Travis on 04/15/2018.  She was found to have mild OSA with recommendations of starting auto-PAP for management.  She does endorse compliance with her auto-PAP at home without any current concerns.  She has continued taking both aspirin 81 mg and Plavix 75 mg despite recommendation of continuing aspirin only after prior visit but regardless denies any bleeding or bruising at this time with prolonged DAPT.  Patient did endorse possible myalgias with atorvastatin therefore recommended to stop atorvastatin and Crestor but unfortunately patient continued to have muscle cramping in her bilateral lower extremities.  She has since also stopped Crestor and has had resolution of muscle cramping's.  She does continue to complain of bilateral lower extremity "heaviness" and states this only  occurs with inactivity but if she is moving around and mobile, she did not experience this heaviness sensation.  She denies any numbness, tingling, burning sensation or radiating pain.  Patient does complain of bilateral knee pain for which she was previously receiving injections for.  blood pressure today 141/96 and as patient does monitor this at home, today's reading is elevated as typically 120s over 60s.  No further concerns  at this time.  Denies new or worsening stroke/TIA symptoms.   ROS:   14 system review of systems performed and negative with exception of fatigue, restless leg, acting out dreams, and constipation  PMH:  Past Medical History:  Diagnosis Date  . Allergy   . Anemia   . Clotting disorder ([place])   . Diabetes mellitus    recent high blood sugar and has RX but not taking  med -b/c she doesn't think she  is diabetic, just had lots of sugar in her diet when  dx'd.  Marland Kitchen Hypertension   . Shortness of breath    on exertion- has low hgb    PSH:  Past Surgical History:  Procedure Laterality Date  . ABDOMINAL HYSTERECTOMY  09/11/2011   Procedure: HYSTERECTOMY ABDOMINAL;  Surgeon: Frederico Hamman, MD;  Location: [facility];  Service: Gynecology;  Laterality: N/A;    Social History:  Social History   Socioeconomic History  . Marital status: Divorced    Spouse name: Not on file  . Number of children: Not on file  . Years of education: Not on file  . Highest education level: Not on file  Occupational History  . Not on file  Social Needs  . Financial resource strain: Not on file  . Food insecurity:    Worry: Not on file    Inability: Not on file  . Transportation needs:    Medical: Not on file    Non-medical: Not on file  Tobacco Use  . Smoking status: Never Smoker  . Smokeless tobacco: Never Used  Substance and Sexual Activity  . Alcohol use: No  . Drug use: No  . Sexual activity: Never  Lifestyle  . Physical activity:    Days per week: Not on file    Minutes per session: Not on file  . Stress: Not on file  Relationships  . Social connections:    Talks on phone: Not on file    Gets together: Not on file    Attends religious service: Not on file    Active member of club or organization: Not on file    Attends meetings of clubs or organizations: Not on file    Relationship status: Not on file  . Intimate partner violence:    Fear of current or ex partner: Not on file     Emotionally abused: Not on file    Physically abused: Not on file    Forced sexual activity: Not on file  Other Topics Concern  . Not on file  Social History Narrative  . Not on file    Family History:  Family History  Problem Relation Age of Onset  . Hypertension Other     Medications:   Current Outpatient Medications on File Prior to Visit  Medication Sig Dispense Refill  . ACCU-CHEK GUIDE test strip USE TO CHECK FASTING BLOOD GLUCOSE ONCE DAILY 100 each 1  . amLODipine (NORVASC) 5 MG tablet Take 1 tablet (5 mg total) by mouth daily. 90 tablet 1  . aspirin 81 MG EC tablet Take 1  tablet (81 mg total) by mouth daily. 90 tablet 3  . blood glucose meter kit and supplies KIT Please check fasting blood sugar once daily. 1 each 0  . clopidogrel (PLAVIX) 75 MG tablet Take 1 tablet (75 mg total) by mouth daily. 90 tablet 3  . glipiZIDE (GLUCOTROL) 5 MG tablet Take 1 tablet (5 mg total) by mouth 2 (two) times daily before a meal. 60 tablet 5   No current facility-administered medications on file prior to visit.     Allergies:   Allergies  Allergen Reactions  . Lisinopril Other (See Comments)    Did not feel good  . Losartan Other (See Comments)    headache  . Aspirin Other (See Comments)    Abdominal pain     Physical Exam  Vitals:   06/22/18 1023  BP: (!) 141/96  Pulse: 67  Weight: 243 lb 12.8 oz (110.6 kg)  Height: '5\' 5"'  (1.651 m)   Body mass index is 40.57 kg/m. No exam data present  General: Obese, pleasant middle-aged, African female, seated, in no evident distress Head: head normocephalic and atraumatic.   Neck: supple with no carotid or supraclavicular bruits Cardiovascular: regular rate and rhythm, no murmurs Musculoskeletal: no deformity Skin:  no rash/petichiae Vascular:  Normal pulses all extremities  Neurologic Exam Mental Status: Awake and fully alert. Oriented to place and time. Recent and remote memory intact. Attention span, concentration and fund  of knowledge appropriate. Mood and affect appropriate.  Cranial Nerves: Fundoscopic exam deferred. Pupils equal, briskly reactive to light. Extraocular movements full without nystagmus. Visual fields full to confrontation. Hearing intact. Facial sensation intact. Face, tongue, palate moves normally and symmetrically.  Motor: Normal bulk and tone. Normal strength in all tested extremity muscles. Sensory.: intact to touch , pinprick , position and vibratory sensation.  Coordination: Rapid alternating movements normal in all extremities. Finger-to-nose and heel-to-shin performed accurately bilaterally. Gait and Station: Arises from chair without difficulty. Stance is normal. Gait demonstrates normal stride length and balance . Able to heel, toe and tandem walk without difficulty.  Reflexes: 1+ and symmetric. Toes downgoing.      Diagnostic Data (Labs, Imaging, Testing)  MR BRAIN WO CONTRAST 01/09/2018 IMPRESSION: MRI HEAD IMPRESSION 1. Two separate areas of restricted diffusion involving the right basal ganglia/corona radiata as above, favored to reflect acute ischemic vascular infarcts. Demyelinating disease could conceivably have this appearance as well, and could be considered in the correct clinical setting. 2. Additional mild nonspecific cerebral white matter changes.  CT ANGIO HEAD W OR WO CONTRAST CT ANGIO NECK W OR WO CONTRAST 01/10/2018 IMPRESSION: 1. Expected evolution of nonhemorrhagic infarcts involving the anterior limb of the right internal capsule in the posterior centrum semi ovale. 2. Moderate stenosis of the proximal left M1 segment. Distal MCA branch vessels are intact. 3. Moderate stenosis of the distal left A1 segment. ACA branch vessels are normal bilaterally with a patent anterior communicating artery. 4. Atherosclerotic changes at the carotid bifurcations bilaterally without significant stenosis. 5. Tortuosity of the cervical internal carotid arteries  without significant stenosis. This is nonspecific, but most commonly seen in the setting of hypertension.  ECHOCARDIOGRAM 01/11/2018 Study Conclusions - Left ventricle: The cavity size was normal. Systolic function was   normal. The estimated ejection fraction was in the range of 60%   to 65%. Mild concentric and moderate focal basal septal   hypertrophy. Wall motion was normal; there were no regional wall   motion abnormalities. Doppler parameters are consistent with  abnormal left ventricular relaxation (grade 1 diastolic   dysfunction). - Atrial septum: No defect or patent foramen ovale was identified. - Pulmonic valve: There was mild regurgitation.      ASSESSMENT: Lindsay Travis is a 57 y.o. year old female here with right BG/CR infarct on 01/09/2018 secondary to small vessel disease. Vascular risk factors include HTN, HLD and DM.  Patient is being seen today for follow-up visit and overall has been stable from a stroke standpoint with continued deficit of bilateral leg "heaviness".  She does continue to complain of statin intolerance with trial of atorvastatin and Crestor with muscle cramping subsiding after discontinuation of these medications.    PLAN: -Continue aspirin 81 mg daily for secondary stroke prevention -F/u with PCP regarding your DM, HLD and HTN management along with continued complaints of bilateral knee pain -Lipid panel done at today's appointment and will consider starting Zetia versus Repatha for HLD management -Continue compliance with CPAP for OSA -continue to monitor BP at home -Continue to stay active and eat a healthy diet -Maintain strict control of hypertension with blood pressure goal below 130/90, diabetes with hemoglobin A1c goal below 6.5% and cholesterol with LDL cholesterol (bad cholesterol) goal below 70 mg/dL. I also advised the patient to eat a healthy diet with plenty of whole grains, cereals, fruits and vegetables, exercise regularly and  maintain ideal body weight.  Follow up in 6 months or call earlier if needed   Greater than 50% of time during this 25 minute visit was spent on counseling,explanation of diagnosis of left BG/CR infarct, reviewing risk factor management of HTN, HLD and DM, planning of further management, discussion with patient and family and coordination of care    Venancio Poisson, [facility]  [facility] [address] [place], [place] [address]  Phone [phone] Fax [phone]

## 2018-06-23 ENCOUNTER — Other Ambulatory Visit: Payer: Self-pay | Admitting: Adult Health

## 2018-06-23 ENCOUNTER — Telehealth: Payer: Self-pay

## 2018-06-23 LAB — LIPID PANEL
CHOL/HDL RATIO: 2.7 ratio (ref 0.0–4.4)
Cholesterol, Total: 176 mg/dL (ref 100–199)
HDL: 65 mg/dL (ref >39)
LDL CALC: 98 mg/dL (ref 0–99)
Triglycerides: 64 mg/dL (ref 0–149)
VLDL Cholesterol Cal: 13 mg/dL (ref 5–40)

## 2018-06-23 MED ORDER — EZETIMIBE 10 MG PO TABS: 10.0000 mg | ORAL_TABLET | Freq: Every day | ORAL | 4 refills | Status: DC

## 2018-06-23 NOTE — Telephone Encounter (Signed)
Left vm for patient to call back about lipid panel results,and new medication management. ------

## 2018-06-23 NOTE — Telephone Encounter (Signed)
-----   Message from Lindsay Poisson, NP sent at 06/23/2018  7:16 AM EDT ----- Please notify patient that her bad cholesterol (LDL) is at 98 (goal less than 70). I will place order for Zetia 10mg . If she is unable to tolerate, please advise her to let us know and we can consider doing Repatha (all of this discussed during visit). Thank you.

## 2018-06-24 ENCOUNTER — Other Ambulatory Visit: Payer: Self-pay

## 2018-06-24 MED ORDER — EZETIMIBE 10 MG PO TABS: 10.0000 mg | ORAL_TABLET | Freq: Every day | ORAL | 4 refills | Status: DC

## 2018-06-24 NOTE — Telephone Encounter (Signed)
Rn call patient about her lipid panel results. Rn stated bad cholesterol (LDL) is at 98 (goal less than 70). The statin Zetia 10mg  was order for her sent to pharmacy. Rn stated if she is unable to tolerate, please advise our office and she can consider doing Repatha (all of this discussed during visit). The pt verbalized that the medication was sent to [facility], and to call back if she is unable to tolerate.

## 2018-07-03 DIAGNOSIS — G4733 Obstructive sleep apnea (adult) (pediatric): Secondary | ICD-10-CM | POA: Diagnosis not present

## 2018-07-17 ENCOUNTER — Other Ambulatory Visit: Payer: Self-pay | Admitting: Urgent Care

## 2018-07-23 ENCOUNTER — Telehealth: Payer: Self-pay | Admitting: Physician Assistant

## 2018-07-23 NOTE — Telephone Encounter (Signed)
Called and spoke with pt regarding appt with Timmothy Euler on 12/23. I advised that Lindsay Travis is leaving the practice and we would need to reschedule her with a different provider. I advised of the 3 providers and I was able to get her rescheduled. She did request that we go ahead and see her earlier due to her feet hurting. I scheduled her with Dr.Sagardia on 11/18 at 1:40. I advised pt of time, building number and late policy. Pt acknowledged.

## 2018-07-27 ENCOUNTER — Other Ambulatory Visit: Payer: Self-pay

## 2018-07-27 ENCOUNTER — Encounter: Payer: Self-pay | Admitting: Emergency Medicine

## 2018-07-27 ENCOUNTER — Ambulatory Visit: Payer: BLUE CROSS/BLUE SHIELD | Admitting: Emergency Medicine

## 2018-07-27 VITALS — BP 127/85 | HR 69 | Temp 98.6°F | Resp 16 | Wt 239.6 lb

## 2018-07-27 DIAGNOSIS — I10 Essential (primary) hypertension: Secondary | ICD-10-CM | POA: Diagnosis not present

## 2018-07-27 DIAGNOSIS — E119 Type 2 diabetes mellitus without complications: Secondary | ICD-10-CM | POA: Diagnosis not present

## 2018-07-27 DIAGNOSIS — Z8673 Personal history of transient ischemic attack (TIA), and cerebral infarction without residual deficits: Secondary | ICD-10-CM | POA: Diagnosis not present

## 2018-07-27 NOTE — Progress Notes (Signed)
Lindsay Travis 57 y.o.   Chief Complaint  Patient presents with  . Diabetes    3 months follow up  . Foot Pain    RIGHT  x 1 month and leg weakness x 5 months   Lab Results  Component Value Date   HGBA1C 7.1 (A) 06/04/2018   BP Readings from Last 3 Encounters:  07/27/18 127/85  06/22/18 (!) 141/96  06/04/18 112/77    HISTORY OF PRESENT ILLNESS: This is a 57 y.o. female with history of hypertension and diabetes status post CVA 02/05/2018, recently seen by neurologist in follow-up, first visit with me, here for follow-up of diabetes and high blood pressure.  Has chronic weakness to both lower legs and occasional pain to her right foot.  Otherwise doing well.  Staying physically active and trying to eat well.  Compliant with medications.  HPI   Prior to Admission medications   Medication Sig Start Date End Date Taking? Authorizing Provider  amLODipine (NORVASC) 5 MG tablet Take 1 tablet (5 mg total) by mouth daily. 06/04/18  Yes Jaynee Eagles, PA-C  aspirin 81 MG EC tablet Take 1 tablet (81 mg total) by mouth daily. 03/20/18  Yes Vanschaick, [name], NP  glipiZIDE (GLUCOTROL) 5 MG tablet TAKE 1 TABLET(5 MG) BY MOUTH TWICE DAILY BEFORE A MEAL 07/17/18  Yes Stallings, Zoe A, MD  hydrochlorothiazide (HYDRODIURIL) 25 MG tablet Take 25 mg by mouth as needed.   Yes [provider]  ACCU-CHEK GUIDE test strip USE TO CHECK FASTING BLOOD GLUCOSE ONCE DAILY 04/13/18   Jaynee Eagles, PA-C  blood glucose meter kit and supplies KIT Please check fasting blood sugar once daily. 02/05/18   Jaynee Eagles, PA-C  ezetimibe (ZETIA) 10 MG tablet Take 1 tablet (10 mg total) by mouth daily. Patient not taking: Reported on 07/27/2018 06/24/18   Venancio Poisson, NP    Allergies  Allergen Reactions  . Lisinopril Other (See Comments)    Did not feel good  . Losartan Other (See Comments)    headache  . Aspirin Other (See Comments)    Abdominal pain    Patient Active Problem List   Diagnosis Date  Noted  . Cerebral thrombosis with cerebral infarction 01/10/2018  . Hyperlipidemia   . Lower extremity weakness 01/09/2018  . Abnormality of gait   . Type 2 diabetes mellitus with complication, without long-term current use of insulin ([place]) 08/07/2015  . Bilateral knee pain 05/26/2012  . Varicose veins of lower extremities with other complications 44/31/5400  . Fibroid, uterus 04/18/2011  . Metrorrhagia 04/18/2011  . Hypertension 04/18/2011    Past Medical History:  Diagnosis Date  . Allergy   . Anemia   . Clotting disorder ([place])   . Diabetes mellitus    recent high blood sugar and has RX but not taking  med -b/c she doesn't think she  is diabetic, just had lots of sugar in her diet when  dx'd.  Marland Kitchen Hypertension   . Shortness of breath    on exertion- has low hgb    Past Surgical History:  Procedure Laterality Date  . ABDOMINAL HYSTERECTOMY  09/11/2011   Procedure: HYSTERECTOMY ABDOMINAL;  Surgeon: Frederico Hamman, MD;  Location: [facility];  Service: Gynecology;  Laterality: N/A;    Social History   Socioeconomic History  . Marital status: Divorced    Spouse name: Not on file  . Number of children: Not on file  . Years of education: Not on file  . Highest education level: Not  on file  Occupational History  . Not on file  Social Needs  . Financial resource strain: Not on file  . Food insecurity:    Worry: Not on file    Inability: Not on file  . Transportation needs:    Medical: Not on file    Non-medical: Not on file  Tobacco Use  . Smoking status: Never Smoker  . Smokeless tobacco: Never Used  Substance and Sexual Activity  . Alcohol use: No  . Drug use: No  . Sexual activity: Never  Lifestyle  . Physical activity:    Days per week: Not on file    Minutes per session: Not on file  . Stress: Not on file  Relationships  . Social connections:    Talks on phone: Not on file    Gets together: Not on file    Attends religious service: Not on file    Active  member of club or organization: Not on file    Attends meetings of clubs or organizations: Not on file    Relationship status: Not on file  . Intimate partner violence:    Fear of current or ex partner: Not on file    Emotionally abused: Not on file    Physically abused: Not on file    Forced sexual activity: Not on file  Other Topics Concern  . Not on file  Social History Narrative  . Not on file    Family History  Problem Relation Age of Onset  . Hypertension Other      Review of Systems  Constitutional: Negative.  Negative for chills and fever.  HENT: Negative.  Negative for hearing loss, sinus pain and sore throat.   Eyes: Negative.  Negative for blurred vision and double vision.  Respiratory: Negative.  Negative for cough and shortness of breath.   Cardiovascular: Negative.  Negative for chest pain and palpitations.  Gastrointestinal: Negative.  Negative for abdominal pain, diarrhea, nausea and vomiting.  Genitourinary: Negative.  Negative for dysuria and hematuria.  Musculoskeletal: Negative.  Negative for back pain, myalgias and neck pain.  Skin: Negative.  Negative for rash.  Neurological: Negative.  Negative for dizziness, sensory change, speech change, loss of consciousness and headaches.  Endo/Heme/Allergies: Negative.   All other systems reviewed and are negative.   Vitals:   07/27/18 1404  BP: 127/85  Pulse: 69  Resp: 16  Temp: 98.6 F (37 C)  SpO2: 98%    Physical Exam  Constitutional: She is oriented to person, place, and time. She appears well-developed.  Obese  HENT:  Head: Normocephalic and atraumatic.  Nose: Nose normal.  Mouth/Throat: Oropharynx is clear and moist.  Eyes: Pupils are equal, round, and reactive to light. Conjunctivae and EOM are normal.  Neck: Normal range of motion. Neck supple. No thyromegaly present.  Cardiovascular: Normal rate, regular rhythm and normal heart sounds.  Pulmonary/Chest: Effort normal and breath sounds  normal.  Abdominal: Soft. There is no tenderness.  Musculoskeletal: Normal range of motion.  Lymphadenopathy:    She has no cervical adenopathy.  Neurological: She is alert and oriented to person, place, and time. No cranial nerve deficit or sensory deficit. She exhibits normal muscle tone. Coordination normal. She displays no Babinski's sign on the right side. She displays no Babinski's sign on the left side.  Reflex Scores:      Patellar reflexes are 1+ on the right side and 1+ on the left side.      Achilles reflexes are 1+  on the right side and 1+ on the left side. Skin: Skin is warm and dry. Capillary refill takes less than 2 seconds. No rash noted.  Psychiatric: She has a normal mood and affect. Her behavior is normal.  Vitals reviewed.    ASSESSMENT & PLAN: Rebacca was seen today for diabetes, foot pain and medication refill.  Diagnoses and all orders for this visit:  Type 2 diabetes mellitus without complication, without long-term current use of insulin (HCC) -     Comprehensive metabolic panel -     Hemoglobin A1c  Essential hypertension  History of CVA (cerebrovascular accident)    Patient Instructions       If you have lab work done today you will be contacted with your lab results within the next 2 weeks.  If you have not heard from [place] then please contact us. The fastest way to get your results is to register for My Chart.   IF you received an x-ray today, you will receive an invoice from [facility]. Please contact [facility] at [phone] with questions or concerns regarding your invoice.   IF you received labwork today, you will receive an invoice from [facility]. Please contact [facility] at [phone] with questions or concerns regarding your invoice.   Our billing staff will not be able to assist you with questions regarding bills from these companies.  You will be contacted with the lab results as soon as they are available. The  fastest way to get your results is to activate your My Chart account. Instructions are located on the last page of this paperwork. If you have not heard from [place] regarding the results in 2 weeks, please contact this office.     Diabetes Mellitus and Nutrition When you have diabetes (diabetes mellitus), it is very important to have healthy eating habits because your blood sugar (glucose) levels are greatly affected by what you eat and drink. Eating healthy foods in the appropriate amounts, at about the same times every day, can help you:  Control your blood glucose.  Lower your risk of heart disease.  Improve your blood pressure.  Reach or maintain a healthy weight.  Every person with diabetes is different, and each person has different needs for a meal plan. Your health care provider may recommend that you work with a diet and nutrition specialist (dietitian) to make a meal plan that is best for you. Your meal plan may vary depending on factors such as:  The calories you need.  The medicines you take.  Your weight.  Your blood glucose, blood pressure, and cholesterol levels.  Your activity level.  Other health conditions you have, such as heart or kidney disease.  How do carbohydrates affect me? Carbohydrates affect your blood glucose level more than any other type of food. Eating carbohydrates naturally increases the amount of glucose in your blood. Carbohydrate counting is a method for keeping track of how many carbohydrates you eat. Counting carbohydrates is important to keep your blood glucose at a healthy level, especially if you use insulin or take certain oral diabetes medicines. It is important to know how many carbohydrates you can safely have in each meal. This is different for every person. Your dietitian can help you calculate how many carbohydrates you should have at each meal and for snack. Foods that contain carbohydrates include:  Bread, cereal, rice, pasta, and  crackers.  Potatoes and corn.  Peas, beans, and lentils.  Milk and yogurt.  Fruit and juice.  Desserts, such as cakes, cookies, ice cream, and candy.  How does alcohol affect me? Alcohol can cause a sudden decrease in blood glucose (hypoglycemia), especially if you use insulin or take certain oral diabetes medicines. Hypoglycemia can be a life-threatening condition. Symptoms of hypoglycemia (sleepiness, dizziness, and confusion) are similar to symptoms of having too much alcohol. If your health care provider says that alcohol is safe for you, follow these guidelines:  Limit alcohol intake to no more than 1 drink per day for nonpregnant women and 2 drinks per day for men. One drink equals 12 oz of beer, 5 oz of wine, or 1 oz of hard liquor.  Do not drink on an empty stomach.  Keep yourself hydrated with water, diet soda, or unsweetened iced tea.  Keep in mind that regular soda, juice, and other mixers may contain a lot of sugar and must be counted as carbohydrates.  What are tips for following this plan? Reading food labels  Start by checking the serving size on the label. The amount of calories, carbohydrates, fats, and other nutrients listed on the label are based on one serving of the food. Many foods contain more than one serving per package.  Check the total grams (g) of carbohydrates in one serving. You can calculate the number of servings of carbohydrates in one serving by dividing the total carbohydrates by 15. For example, if a food has 30 g of total carbohydrates, it would be equal to 2 servings of carbohydrates.  Check the number of grams (g) of saturated and trans fats in one serving. Choose foods that have low or no amount of these fats.  Check the number of milligrams (mg) of sodium in one serving. Most people should limit total sodium intake to less than 2,300 mg per day.  Always check the nutrition information of foods labeled as "low-fat" or "nonfat". These foods  may be higher in added sugar or refined carbohydrates and should be avoided.  Talk to your dietitian to identify your daily goals for nutrients listed on the label. Shopping  Avoid buying canned, premade, or processed foods. These foods tend to be high in fat, sodium, and added sugar.  Shop around the outside edge of the grocery store. This includes fresh fruits and vegetables, bulk grains, fresh meats, and fresh dairy. Cooking  Use low-heat cooking methods, such as baking, instead of high-heat cooking methods like deep frying.  Cook using healthy oils, such as olive, canola, or sunflower oil.  Avoid cooking with butter, cream, or high-fat meats. Meal planning  Eat meals and snacks regularly, preferably at the same times every day. Avoid going long periods of time without eating.  Eat foods high in fiber, such as fresh fruits, vegetables, beans, and whole grains. Talk to your dietitian about how many servings of carbohydrates you can eat at each meal.  Eat 4-6 ounces of lean protein each day, such as lean meat, chicken, fish, eggs, or tofu. 1 ounce is equal to 1 ounce of meat, chicken, or fish, 1 egg, or 1/4 cup of tofu.  Eat some foods each day that contain healthy fats, such as avocado, nuts, seeds, and fish. Lifestyle   Check your blood glucose regularly.  Exercise at least 30 minutes 5 or more days each week, or as told by your health care provider.  Take medicines as told by your health care provider.  Do not use any products that contain nicotine or tobacco, such as cigarettes and e-cigarettes. If you need  help quitting, ask your health care provider.  Work with a Social worker or diabetes educator to identify strategies to manage stress and any emotional and social challenges. What are some questions to ask my health care provider?  Do I need to meet with a diabetes educator?  Do I need to meet with a dietitian?  What number can I call if I have questions?  When are the  best times to check my blood glucose? Where to find more information:  American Diabetes Association: diabetes.org/food-and-fitness/food  Academy of Nutrition and Dietetics: PokerClues.dk  [employer] of Diabetes and Digestive and Kidney Diseases (NIH): ContactWire.be Summary  A healthy meal plan will help you control your blood glucose and maintain a healthy lifestyle.  Working with a diet and nutrition specialist (dietitian) can help you make a meal plan that is best for you.  Keep in mind that carbohydrates and alcohol have immediate effects on your blood glucose levels. It is important to count carbohydrates and to use alcohol carefully. This information is not intended to replace advice given to you by your health care provider. Make sure you discuss any questions you have with your health care provider. Document Released: 05/23/2005 Document Revised: 09/30/2016 Document Reviewed: 09/30/2016 Elsevier Interactive Patient Education  2018 [employer].  Hypertension Hypertension, commonly called high blood pressure, is when the force of blood pumping through the arteries is too strong. The arteries are the blood vessels that carry blood from the heart throughout the body. Hypertension forces the heart to work harder to pump blood and may cause arteries to become narrow or stiff. Having untreated or uncontrolled hypertension can cause heart attacks, strokes, kidney disease, and other problems. A blood pressure reading consists of a higher number over a lower number. Ideally, your blood pressure should be below 120/80. The first ("top") number is called the systolic pressure. It is a measure of the pressure in your arteries as your heart beats. The second ("bottom") number is called the diastolic pressure. It is a measure of the pressure in your arteries as the heart  relaxes. What are the causes? The cause of this condition is not known. What increases the risk? Some risk factors for high blood pressure are under your control. Others are not. Factors you can change  Smoking.  Having type 2 diabetes mellitus, high cholesterol, or both.  Not getting enough exercise or physical activity.  Being overweight.  Having too much fat, sugar, calories, or salt (sodium) in your diet.  Drinking too much alcohol. Factors that are difficult or impossible to change  Having chronic kidney disease.  Having a family history of high blood pressure.  Age. Risk increases with age.  Race. You may be at higher risk if you are African-American.  Gender. Men are at higher risk than women before age 12. After age 3, women are at higher risk than men.  Having obstructive sleep apnea.  Stress. What are the signs or symptoms? Extremely high blood pressure (hypertensive crisis) may cause:  Headache.  Anxiety.  Shortness of breath.  Nosebleed.  Nausea and vomiting.  Severe chest pain.  Jerky movements you cannot control (seizures).  How is this diagnosed? This condition is diagnosed by measuring your blood pressure while you are seated, with your arm resting on a surface. The cuff of the blood pressure monitor will be placed directly against the skin of your upper arm at the level of your heart. It should be measured at least twice using the same arm.  Certain conditions can cause a difference in blood pressure between your right and left arms. Certain factors can cause blood pressure readings to be lower or higher than normal (elevated) for a short period of time:  When your blood pressure is higher when you are in a health care provider's office than when you are at home, this is called white coat hypertension. Most people with this condition do not need medicines.  When your blood pressure is higher at home than when you are in a health care provider's  office, this is called masked hypertension. Most people with this condition may need medicines to control blood pressure.  If you have a high blood pressure reading during one visit or you have normal blood pressure with other risk factors:  You may be asked to return on a different day to have your blood pressure checked again.  You may be asked to monitor your blood pressure at home for 1 week or longer.  If you are diagnosed with hypertension, you may have other blood or imaging tests to help your health care provider understand your overall risk for other conditions. How is this treated? This condition is treated by making healthy lifestyle changes, such as eating healthy foods, exercising more, and reducing your alcohol intake. Your health care provider may prescribe medicine if lifestyle changes are not enough to get your blood pressure under control, and if:  Your systolic blood pressure is above 130.  Your diastolic blood pressure is above 80.  Your personal target blood pressure may vary depending on your medical conditions, your age, and other factors. Follow these instructions at home: Eating and drinking  Eat a diet that is high in fiber and potassium, and low in sodium, added sugar, and fat. An example eating plan is called the DASH (Dietary Approaches to Stop Hypertension) diet. To eat this way: ? Eat plenty of fresh fruits and vegetables. Try to fill half of your plate at each meal with fruits and vegetables. ? Eat whole grains, such as whole wheat pasta, brown rice, or whole grain bread. Fill about one quarter of your plate with whole grains. ? Eat or drink low-fat dairy products, such as skim milk or low-fat yogurt. ? Avoid fatty cuts of meat, processed or cured meats, and poultry with skin. Fill about one quarter of your plate with lean proteins, such as fish, chicken without skin, beans, eggs, and tofu. ? Avoid premade and processed foods. These tend to be higher in  sodium, added sugar, and fat.  Reduce your daily sodium intake. Most people with hypertension should eat less than 1,500 mg of sodium a day.  Limit alcohol intake to no more than 1 drink a day for nonpregnant women and 2 drinks a day for men. One drink equals 12 oz of beer, 5 oz of wine, or 1 oz of hard liquor. Lifestyle  Work with your health care provider to maintain a healthy body weight or to lose weight. Ask what an ideal weight is for you.  Get at least 30 minutes of exercise that causes your heart to beat faster (aerobic exercise) most days of the week. Activities may include walking, swimming, or biking.  Include exercise to strengthen your muscles (resistance exercise), such as pilates or lifting weights, as part of your weekly exercise routine. Try to do these types of exercises for 30 minutes at least 3 days a week.  Do not use any products that contain nicotine or tobacco, such as cigarettes  and e-cigarettes. If you need help quitting, ask your health care provider.  Monitor your blood pressure at home as told by your health care provider.  Keep all follow-up visits as told by your health care provider. This is important. Medicines  Take over-the-counter and prescription medicines only as told by your health care provider. Follow directions carefully. Blood pressure medicines must be taken as prescribed.  Do not skip doses of blood pressure medicine. Doing this puts you at risk for problems and can make the medicine less effective.  Ask your health care provider about side effects or reactions to medicines that you should watch for. Contact a health care provider if:  You think you are having a reaction to a medicine you are taking.  You have headaches that keep coming back (recurring).  You feel dizzy.  You have swelling in your ankles.  You have trouble with your vision. Get help right away if:  You develop a severe headache or confusion.  You have unusual  weakness or numbness.  You feel faint.  You have severe pain in your chest or abdomen.  You vomit repeatedly.  You have trouble breathing. Summary  Hypertension is when the force of blood pumping through your arteries is too strong. If this condition is not controlled, it may put you at risk for serious complications.  Your personal target blood pressure may vary depending on your medical conditions, your age, and other factors. For most people, a normal blood pressure is less than 120/80.  Hypertension is treated with lifestyle changes, medicines, or a combination of both. Lifestyle changes include weight loss, eating a healthy, low-sodium diet, exercising more, and limiting alcohol. This information is not intended to replace advice given to you by your health care provider. Make sure you discuss any questions you have with your health care provider. Document Released: 08/26/2005 Document Revised: 07/24/2016 Document Reviewed: 07/24/2016 Elsevier Interactive Patient Education  2018 Elsevier Inc.      Agustina Caroli, MD [facility]

## 2018-07-27 NOTE — Patient Instructions (Addendum)
   If you have lab work done today you will be contacted with your lab results within the next 2 weeks.  If you have not heard from us then please contact us. The fastest way to get your results is to register for My Chart.   IF you received an x-ray today, you will receive an invoice from [facility]. Please contact [facility] at [phone] with questions or concerns regarding your invoice.   IF you received labwork today, you will receive an invoice from [facility]. Please contact [facility] at [phone] with questions or concerns regarding your invoice.   Our billing staff will not be able to assist you with questions regarding bills from these companies.  You will be contacted with the lab results as soon as they are available. The fastest way to get your results is to activate your My Chart account. Instructions are located on the last page of this paperwork. If you have not heard from us regarding the results in 2 weeks, please contact this office.     Diabetes Mellitus and Nutrition When you have diabetes (diabetes mellitus), it is very important to have healthy eating habits because your blood sugar (glucose) levels are greatly affected by what you eat and drink. Eating healthy foods in the appropriate amounts, at about the same times every day, can help you:  Control your blood glucose.  Lower your risk of heart disease.  Improve your blood pressure.  Reach or maintain a healthy weight.  Every person with diabetes is different, and each person has different needs for a meal plan. Your health care provider may recommend that you work with a diet and nutrition specialist (dietitian) to make a meal plan that is best for you. Your meal plan may vary depending on factors such as:  The calories you need.  The medicines you take.  Your weight.  Your blood glucose, blood pressure, and cholesterol levels.  Your activity level.  Other health conditions you  have, such as heart or kidney disease.  How do carbohydrates affect me? Carbohydrates affect your blood glucose level more than any other type of food. Eating carbohydrates naturally increases the amount of glucose in your blood. Carbohydrate counting is a method for keeping track of how many carbohydrates you eat. Counting carbohydrates is important to keep your blood glucose at a healthy level, especially if you use insulin or take certain oral diabetes medicines. It is important to know how many carbohydrates you can safely have in each meal. This is different for every person. Your dietitian can help you calculate how many carbohydrates you should have at each meal and for snack. Foods that contain carbohydrates include:  Bread, cereal, rice, pasta, and crackers.  Potatoes and corn.  Peas, beans, and lentils.  Milk and yogurt.  Fruit and juice.  Desserts, such as cakes, cookies, ice cream, and candy.  How does alcohol affect me? Alcohol can cause a sudden decrease in blood glucose (hypoglycemia), especially if you use insulin or take certain oral diabetes medicines. Hypoglycemia can be a life-threatening condition. Symptoms of hypoglycemia (sleepiness, dizziness, and confusion) are similar to symptoms of having too much alcohol. If your health care provider says that alcohol is safe for you, follow these guidelines:  Limit alcohol intake to no more than 1 drink per day for nonpregnant women and 2 drinks per day for men. One drink equals 12 oz of beer, 5 oz of wine, or 1 oz of hard liquor.  Do   not drink on an empty stomach.  Keep yourself hydrated with water, diet soda, or unsweetened iced tea.  Keep in mind that regular soda, juice, and other mixers may contain a lot of sugar and must be counted as carbohydrates.  What are tips for following this plan? Reading food labels  Start by checking the serving size on the label. The amount of calories, carbohydrates, fats, and other  nutrients listed on the label are based on one serving of the food. Many foods contain more than one serving per package.  Check the total grams (g) of carbohydrates in one serving. You can calculate the number of servings of carbohydrates in one serving by dividing the total carbohydrates by 15. For example, if a food has 30 g of total carbohydrates, it would be equal to 2 servings of carbohydrates.  Check the number of grams (g) of saturated and trans fats in one serving. Choose foods that have low or no amount of these fats.  Check the number of milligrams (mg) of sodium in one serving. Most people should limit total sodium intake to less than 2,300 mg per day.  Always check the nutrition information of foods labeled as "low-fat" or "nonfat". These foods may be higher in added sugar or refined carbohydrates and should be avoided.  Talk to your dietitian to identify your daily goals for nutrients listed on the label. Shopping  Avoid buying canned, premade, or processed foods. These foods tend to be high in fat, sodium, and added sugar.  Shop around the outside edge of the grocery store. This includes fresh fruits and vegetables, bulk grains, fresh meats, and fresh dairy. Cooking  Use low-heat cooking methods, such as baking, instead of high-heat cooking methods like deep frying.  Cook using healthy oils, such as olive, canola, or sunflower oil.  Avoid cooking with butter, cream, or high-fat meats. Meal planning  Eat meals and snacks regularly, preferably at the same times every day. Avoid going long periods of time without eating.  Eat foods high in fiber, such as fresh fruits, vegetables, beans, and whole grains. Talk to your dietitian about how many servings of carbohydrates you can eat at each meal.  Eat 4-6 ounces of lean protein each day, such as lean meat, chicken, fish, eggs, or tofu. 1 ounce is equal to 1 ounce of meat, chicken, or fish, 1 egg, or 1/4 cup of tofu.  Eat some  foods each day that contain healthy fats, such as avocado, nuts, seeds, and fish. Lifestyle   Check your blood glucose regularly.  Exercise at least 30 minutes 5 or more days each week, or as told by your health care provider.  Take medicines as told by your health care provider.  Do not use any products that contain nicotine or tobacco, such as cigarettes and e-cigarettes. If you need help quitting, ask your health care provider.  Work with a counselor or diabetes educator to identify strategies to manage stress and any emotional and social challenges. What are some questions to ask my health care provider?  Do I need to meet with a diabetes educator?  Do I need to meet with a dietitian?  What number can I call if I have questions?  When are the best times to check my blood glucose? Where to find more information:  American Diabetes Association: diabetes.org/food-and-fitness/food  Academy of Nutrition and Dietetics: www.eatright.org/resources/health/diseases-and-conditions/diabetes  National Institute of Diabetes and Digestive and Kidney Diseases (NIH): www.niddk.nih.gov/health-information/diabetes/overview/diet-eating-physical-activity Summary  A healthy meal plan will   help you control your blood glucose and maintain a healthy lifestyle.  Working with a diet and nutrition specialist (dietitian) can help you make a meal plan that is best for you.  Keep in mind that carbohydrates and alcohol have immediate effects on your blood glucose levels. It is important to count carbohydrates and to use alcohol carefully. This information is not intended to replace advice given to you by your health care provider. Make sure you discuss any questions you have with your health care provider. Document Released: 05/23/2005 Document Revised: 09/30/2016 Document Reviewed: 09/30/2016 Elsevier Interactive Patient Education  2018 Elsevier Inc.  Hypertension Hypertension, commonly called high  blood pressure, is when the force of blood pumping through the arteries is too strong. The arteries are the blood vessels that carry blood from the heart throughout the body. Hypertension forces the heart to work harder to pump blood and may cause arteries to become narrow or stiff. Having untreated or uncontrolled hypertension can cause heart attacks, strokes, kidney disease, and other problems. A blood pressure reading consists of a higher number over a lower number. Ideally, your blood pressure should be below 120/80. The first ("top") number is called the systolic pressure. It is a measure of the pressure in your arteries as your heart beats. The second ("bottom") number is called the diastolic pressure. It is a measure of the pressure in your arteries as the heart relaxes. What are the causes? The cause of this condition is not known. What increases the risk? Some risk factors for high blood pressure are under your control. Others are not. Factors you can change  Smoking.  Having type 2 diabetes mellitus, high cholesterol, or both.  Not getting enough exercise or physical activity.  Being overweight.  Having too much fat, sugar, calories, or salt (sodium) in your diet.  Drinking too much alcohol. Factors that are difficult or impossible to change  Having chronic kidney disease.  Having a family history of high blood pressure.  Age. Risk increases with age.  Race. You may be at higher risk if you are African-American.  Gender. Men are at higher risk than women before age 45. After age 65, women are at higher risk than men.  Having obstructive sleep apnea.  Stress. What are the signs or symptoms? Extremely high blood pressure (hypertensive crisis) may cause:  Headache.  Anxiety.  Shortness of breath.  Nosebleed.  Nausea and vomiting.  Severe chest pain.  Jerky movements you cannot control (seizures).  How is this diagnosed? This condition is diagnosed by  measuring your blood pressure while you are seated, with your arm resting on a surface. The cuff of the blood pressure monitor will be placed directly against the skin of your upper arm at the level of your heart. It should be measured at least twice using the same arm. Certain conditions can cause a difference in blood pressure between your right and left arms. Certain factors can cause blood pressure readings to be lower or higher than normal (elevated) for a short period of time:  When your blood pressure is higher when you are in a health care provider's office than when you are at home, this is called white coat hypertension. Most people with this condition do not need medicines.  When your blood pressure is higher at home than when you are in a health care provider's office, this is called masked hypertension. Most people with this condition may need medicines to control blood pressure.  If you have   a high blood pressure reading during one visit or you have normal blood pressure with other risk factors:  You may be asked to return on a different day to have your blood pressure checked again.  You may be asked to monitor your blood pressure at home for 1 week or longer.  If you are diagnosed with hypertension, you may have other blood or imaging tests to help your health care provider understand your overall risk for other conditions. How is this treated? This condition is treated by making healthy lifestyle changes, such as eating healthy foods, exercising more, and reducing your alcohol intake. Your health care provider may prescribe medicine if lifestyle changes are not enough to get your blood pressure under control, and if:  Your systolic blood pressure is above 130.  Your diastolic blood pressure is above 80.  Your personal target blood pressure may vary depending on your medical conditions, your age, and other factors. Follow these instructions at home: Eating and drinking  Eat a  diet that is high in fiber and potassium, and low in sodium, added sugar, and fat. An example eating plan is called the DASH (Dietary Approaches to Stop Hypertension) diet. To eat this way: ? Eat plenty of fresh fruits and vegetables. Try to fill half of your plate at each meal with fruits and vegetables. ? Eat whole grains, such as whole wheat pasta, brown rice, or whole grain bread. Fill about one quarter of your plate with whole grains. ? Eat or drink low-fat dairy products, such as skim milk or low-fat yogurt. ? Avoid fatty cuts of meat, processed or cured meats, and poultry with skin. Fill about one quarter of your plate with lean proteins, such as fish, chicken without skin, beans, eggs, and tofu. ? Avoid premade and processed foods. These tend to be higher in sodium, added sugar, and fat.  Reduce your daily sodium intake. Most people with hypertension should eat less than 1,500 mg of sodium a day.  Limit alcohol intake to no more than 1 drink a day for nonpregnant women and 2 drinks a day for men. One drink equals 12 oz of beer, 5 oz of wine, or 1 oz of hard liquor. Lifestyle  Work with your health care provider to maintain a healthy body weight or to lose weight. Ask what an ideal weight is for you.  Get at least 30 minutes of exercise that causes your heart to beat faster (aerobic exercise) most days of the week. Activities may include walking, swimming, or biking.  Include exercise to strengthen your muscles (resistance exercise), such as pilates or lifting weights, as part of your weekly exercise routine. Try to do these types of exercises for 30 minutes at least 3 days a week.  Do not use any products that contain nicotine or tobacco, such as cigarettes and e-cigarettes. If you need help quitting, ask your health care provider.  Monitor your blood pressure at home as told by your health care provider.  Keep all follow-up visits as told by your health care provider. This is  important. Medicines  Take over-the-counter and prescription medicines only as told by your health care provider. Follow directions carefully. Blood pressure medicines must be taken as prescribed.  Do not skip doses of blood pressure medicine. Doing this puts you at risk for problems and can make the medicine less effective.  Ask your health care provider about side effects or reactions to medicines that you should watch for. Contact a health care   provider if:  You think you are having a reaction to a medicine you are taking.  You have headaches that keep coming back (recurring).  You feel dizzy.  You have swelling in your ankles.  You have trouble with your vision. Get help right away if:  You develop a severe headache or confusion.  You have unusual weakness or numbness.  You feel faint.  You have severe pain in your chest or abdomen.  You vomit repeatedly.  You have trouble breathing. Summary  Hypertension is when the force of blood pumping through your arteries is too strong. If this condition is not controlled, it may put you at risk for serious complications.  Your personal target blood pressure may vary depending on your medical conditions, your age, and other factors. For most people, a normal blood pressure is less than 120/80.  Hypertension is treated with lifestyle changes, medicines, or a combination of both. Lifestyle changes include weight loss, eating a healthy, low-sodium diet, exercising more, and limiting alcohol. This information is not intended to replace advice given to you by your health care provider. Make sure you discuss any questions you have with your health care provider. Document Released: 08/26/2005 Document Revised: 07/24/2016 Document Reviewed: 07/24/2016 Elsevier Interactive Patient Education  2018 Elsevier Inc.  

## 2018-07-28 LAB — COMPREHENSIVE METABOLIC PANEL
A/G RATIO: 1.5 (ref 1.2–2.2)
ALBUMIN: 4.4 g/dL (ref 3.5–5.5)
ALK PHOS: 80 IU/L (ref 39–117)
ALT: 29 IU/L (ref 0–32)
AST: 27 IU/L (ref 0–40)
BUN / CREAT RATIO: 18 (ref 9–23)
BUN: 13 mg/dL (ref 6–24)
Bilirubin Total: <0.2 mg/dL (ref 0.0–1.2)
CO2: 23 mmol/L (ref 20–29)
CREATININE: 0.73 mg/dL (ref 0.57–1.00)
Calcium: 9.5 mg/dL (ref 8.7–10.2)
Chloride: 104 mmol/L (ref 96–106)
GFR calc Af Amer: 106 mL/min/{1.73_m2} (ref >59)
GFR, EST NON AFRICAN AMERICAN: 92 mL/min/{1.73_m2} (ref >59)
GLOBULIN, TOTAL: 2.9 g/dL (ref 1.5–4.5)
Glucose: 104 mg/dL — ABNORMAL HIGH (ref 65–99)
POTASSIUM: 4.1 mmol/L (ref 3.5–5.2)
SODIUM: 143 mmol/L (ref 134–144)
Total Protein: 7.3 g/dL (ref 6.0–8.5)

## 2018-07-28 LAB — HEMOGLOBIN A1C
ESTIMATED AVERAGE GLUCOSE: 151 mg/dL
HEMOGLOBIN A1C: 6.9 % — AB (ref 4.8–5.6)

## 2018-07-29 ENCOUNTER — Encounter: Payer: Self-pay | Admitting: Radiology

## 2018-07-30 ENCOUNTER — Ambulatory Visit: Payer: Self-pay | Admitting: Adult Health

## 2018-08-03 DIAGNOSIS — G4733 Obstructive sleep apnea (adult) (pediatric): Secondary | ICD-10-CM | POA: Diagnosis not present

## 2018-08-25 ENCOUNTER — Encounter: Payer: Self-pay | Admitting: Adult Health

## 2018-08-27 ENCOUNTER — Encounter: Payer: Self-pay | Admitting: Adult Health

## 2018-08-27 ENCOUNTER — Telehealth: Payer: Self-pay | Admitting: Emergency Medicine

## 2018-08-27 ENCOUNTER — Telehealth: Payer: Self-pay | Admitting: Adult Health

## 2018-08-27 ENCOUNTER — Ambulatory Visit: Payer: BLUE CROSS/BLUE SHIELD | Admitting: Adult Health

## 2018-08-27 ENCOUNTER — Other Ambulatory Visit: Payer: Self-pay | Admitting: Emergency Medicine

## 2018-08-27 VITALS — BP 139/90 | HR 68 | Ht 65.0 in | Wt 236.2 lb

## 2018-08-27 DIAGNOSIS — G4733 Obstructive sleep apnea (adult) (pediatric): Secondary | ICD-10-CM | POA: Diagnosis not present

## 2018-08-27 DIAGNOSIS — Z9989 Dependence on other enabling machines and devices: Secondary | ICD-10-CM | POA: Diagnosis not present

## 2018-08-27 MED ORDER — GLIPIZIDE 5 MG PO TABS: ORAL_TABLET | ORAL | 0 refills | Status: DC

## 2018-08-27 NOTE — Progress Notes (Signed)
cpap orders sent by CM to [facility],  She responded that did received order.  [facility].  08-27-18 sy

## 2018-08-27 NOTE — Telephone Encounter (Signed)
Noted! Thank you

## 2018-08-27 NOTE — Telephone Encounter (Signed)
Courtesy refill given until appoint on 10/26/17 Requested Prescriptions  Pending Prescriptions Disp Refills  . glipiZIDE (GLUCOTROL) 5 MG tablet 120 tablet 0    Sig: TAKE 1 TABLET(5 MG) BY MOUTH TWICE DAILY BEFORE A MEAL     Endocrinology:  Diabetes - Sulfonylureas Passed - 08/27/2018  4:18 PM      Passed - HBA1C is between 0 and 7.9 and within 180 days    Hgb A1c MFr Bld  Date Value Ref Range Status  07/27/2018 6.9 (H) 4.8 - 5.6 % Final    Comment:             Prediabetes: 5.7 - 6.4          Diabetes: >6.4          Glycemic control for adults with diabetes: <7.0          Passed - Valid encounter within last 6 months    Recent Outpatient Visits          1 month ago Type 2 diabetes mellitus without complication, without long-term current use of insulin [facility])   [facility] Care at [facility], Ines Bloomer, MD   2 months ago Essential hypertension   Primary Care at [place], [place]   5 months ago Essential hypertension   Primary Care at [place], [place]   6 months ago Other fatigue   Primary Care at [place], [place]   7 months ago Chronic pain of both knees   Primary Care at [facility], [place], [place]      Future Appointments            In 2 months Sagardia, Ines Bloomer, MD Primary Care at [place], [facility]

## 2018-08-27 NOTE — Telephone Encounter (Signed)
Rn call patient about her not tolerating zetia. Rn stated per [name] NP she can initiate repatha injection if she is willing to try. PT stated she will try repahta to see if it works. Rn stated message will be sent to [facility] NP.

## 2018-08-27 NOTE — Telephone Encounter (Signed)
Venancio Poisson, NP  Marval Regal, RN        As previously discussed with patient, it was recommended if she was unable to tolerate Zetia that we would consider initiating Repatha for HLD management. Could you please call patient to see if she is willing to start Repatha at this time. Thank you.

## 2018-08-27 NOTE — Telephone Encounter (Signed)
Patient was in office today for an office visit for follow-up for CPAP.  She wanted me to advise Lindsay Travis that she stopped Zetia because it was causing pain in her legs.  She is now only taking omega-3 vitamin for her cholesterol.

## 2018-08-27 NOTE — Patient Instructions (Signed)
Your Plan:  Continue using CPAP nightly and greater than 4 hours each night Mask refitting If your symptoms worsen or you develop new symptoms please let us know.    Thank you for coming to see us at [facility]. I hope we have been able to provide you high quality care today.  You may receive a patient satisfaction survey over the next few weeks. We would appreciate your feedback and comments so that we may continue to improve ourselves and the health of our patients.  

## 2018-08-27 NOTE — Telephone Encounter (Signed)
Copied from [place] [phone]. Topic: Quick Communication - Rx Refill/Question >> Aug 27, 2018  4:09 PM Alfredia Ferguson R wrote: Medication: glipiZIDE (GLUCOTROL) 5 MG tablet  Has the patient contacted their pharmacy? Yes  Preferred Pharmacy (with phone number or street name): [facility], [place] - [phone] (Phone) [phone] (Fax)    Agent: Please be advised that RX refills may take up to 3 business days. We ask that you follow-up with your pharmacy.

## 2018-08-27 NOTE — Telephone Encounter (Signed)
Copied from [place] [phone]. Topic: General - Other >> Aug 27, 2018  4:13 PM Lindsay Travis R wrote: Patient called in and stated she didn't receive her lab results

## 2018-08-27 NOTE — Progress Notes (Addendum)
PATIENT: Lindsay Travis DOB: 04/04/1961  REASON FOR VISIT: follow up HISTORY FROM: patient  HISTORY OF PRESENT ILLNESS: Today 08/27/18:  [name] is a 57 year old female with a history of obstructive sleep apnea on CPAP.  She returns today for compliance download.  Her download indicates that she used her machine 29 out of 30 days for compliance of 97%.  She used her machine greater than 4 hours 24 days for compliance of 80%.  On average she uses her machine 4 hours and 44 minutes.  Her residual AHI is 1.2 on 5 to 13 cm of water with EPR of 1.  She has a leak in the 95th percentile at 31.9 L/min.  She states that she does feel the mask leaking at night.  Her Epworth sleepiness score is 0.  She returns today for evaluation.   HISTORY (copied from Dr. Guadelupe Sabin note)[name] is a 57 year old right-handed woman with an underlying medical history of anemia, allergies, diabetes, hypertension, clotting disorder, right basal ganglia infarct in May 2019 and morbid obesity with a BMI of over 40, who reports snoring and feeling tired since her stroke. I reviewed your office note from 03/20/2018. Her Epworth sleepiness score is 1/24, fatigue score of 53/63.  For about a month after the stroke, she was sleepy during the day, and took naps, but not now. She has a bedtime between 10-11 PM and rise time is around 7-8 AM. She has significant nocturia of 4/night, denies AM HAs, denies a FHx of OSA. She is a NS, does not drink EtOH and does not drink caffeine daily. She is married and lives with her husband and 3 of her 4 children. She does not watch TV in her bedroom. She has maintained weight.    REVIEW OF SYSTEMS: Out of a complete 14 system review of symptoms, the patient complains only of the following symptoms, and all other reviewed systems are negative.  See HPI  ALLERGIES: Allergies  Allergen Reactions  . Lisinopril Other (See Comments)    Did not feel good  . Losartan Other (See Comments)    headache  . Aspirin Other (See Comments)    Abdominal pain    HOME MEDICATIONS: Outpatient Medications Prior to Visit  Medication Sig Dispense Refill  . ACCU-CHEK GUIDE test strip USE TO CHECK FASTING BLOOD GLUCOSE ONCE DAILY 100 each 1  . amLODipine (NORVASC) 5 MG tablet Take 1 tablet (5 mg total) by mouth daily. 90 tablet 1  . aspirin 81 MG EC tablet Take 1 tablet (81 mg total) by mouth daily. 90 tablet 3  . blood glucose meter kit and supplies KIT Please check fasting blood sugar once daily. 1 each 0  . ezetimibe (ZETIA) 10 MG tablet Take 1 tablet (10 mg total) by mouth daily. 30 tablet 4  . hydrochlorothiazide (HYDRODIURIL) 25 MG tablet Take 25 mg by mouth as needed.    Marland Kitchen glipiZIDE (GLUCOTROL) 5 MG tablet TAKE 1 TABLET(5 MG) BY MOUTH TWICE DAILY BEFORE A MEAL (Patient not taking: Reported on 08/27/2018) 60 tablet 0   No facility-administered medications prior to visit.     PAST MEDICAL HISTORY: Past Medical History:  Diagnosis Date  . Allergy   . Anemia   . Clotting disorder ([place])   . Diabetes mellitus    recent high blood sugar and has RX but not taking  med -b/c she doesn't think she  is diabetic, just had lots of sugar in her diet when  dx'd.  Marland Kitchen  Hypertension   . Shortness of breath    on exertion- has low hgb    PAST SURGICAL HISTORY: Past Surgical History:  Procedure Laterality Date  . ABDOMINAL HYSTERECTOMY  09/11/2011   Procedure: HYSTERECTOMY ABDOMINAL;  Surgeon: Frederico Hamman, MD;  Location: [facility];  Service: Gynecology;  Laterality: N/A;    FAMILY HISTORY: Family History  Problem Relation Age of Onset  . Hypertension Other     SOCIAL HISTORY: Social History   Socioeconomic History  . Marital status: Divorced    Spouse name: Not on file  . Number of children: Not on file  . Years of education: Not on file  . Highest education level: Not on file  Occupational History  . Not on file  Social Needs  . Financial resource strain: Not on file  . Food  insecurity:    Worry: Not on file    Inability: Not on file  . Transportation needs:    Medical: Not on file    Non-medical: Not on file  Tobacco Use  . Smoking status: Never Smoker  . Smokeless tobacco: Never Used  Substance and Sexual Activity  . Alcohol use: No  . Drug use: No  . Sexual activity: Never  Lifestyle  . Physical activity:    Days per week: Not on file    Minutes per session: Not on file  . Stress: Not on file  Relationships  . Social connections:    Talks on phone: Not on file    Gets together: Not on file    Attends religious service: Not on file    Active member of club or organization: Not on file    Attends meetings of clubs or organizations: Not on file    Relationship status: Not on file  . Intimate partner violence:    Fear of current or ex partner: Not on file    Emotionally abused: Not on file    Physically abused: Not on file    Forced sexual activity: Not on file  Other Topics Concern  . Not on file  Social History Narrative  . Not on file      PHYSICAL EXAM  Vitals:   08/27/18 1123  BP: 139/90  Pulse: 68  Weight: 236 lb 3.2 oz (107.1 kg)  Height: _0  (1.651 m)   Body mass index is 39.31 kg/m.  Generalized: Well developed, in no acute distress   Neurological examination  Mentation: Alert oriented to time, place, history taking. Follows all commands speech and language fluent Cranial nerve II-XII:  Extraocular movements were full, visual field were full on confrontational test. Facial sensation and strength were normal. Uvula tongue midline. Head turning and shoulder shrug  were normal and symmetric.  Neck circumference 13-1/2 inches, Mallampati 4+ Motor: The motor testing reveals 5 over 5 strength of all 4 extremities. Good symmetric motor tone is noted throughout.  Sensory: Sensory testing is intact to soft touch on all 4 extremities. No evidence of extinction is noted.  Coordination: Cerebellar testing reveals good  finger-nose-finger and heel-to-shin bilaterally.  Gait and station: Gait is normal.  Reflexes: Deep tendon reflexes are symmetric and normal bilaterally.   DIAGNOSTIC DATA (LABS, IMAGING, TESTING) - I reviewed patient records, labs, notes, testing and imaging myself where available.  Lab Results  Component Value Date   WBC 4.1 02/05/2018   HGB 13.2 02/05/2018   HCT 38.9 02/05/2018   MCV 83 02/05/2018   PLT 199 02/05/2018  Component Value Date/Time   NA 143 07/27/2018 1451   K 4.1 07/27/2018 1451   CL 104 07/27/2018 1451   CO2 23 07/27/2018 1451   GLUCOSE 104 (H) 07/27/2018 1451   GLUCOSE 161 (H) 01/11/2018 0405   BUN 13 07/27/2018 1451   CREATININE 0.73 07/27/2018 1451   CREATININE 0.97 07/07/2015 1612   CALCIUM 9.5 07/27/2018 1451   PROT 7.3 07/27/2018 1451   ALBUMIN 4.4 07/27/2018 1451   AST 27 07/27/2018 1451   ALT 29 07/27/2018 1451   ALKPHOS 80 07/27/2018 1451   BILITOT <0.2 07/27/2018 1451   GFRNONAA 92 07/27/2018 1451   GFRNONAA >89 11/28/2014 1450   GFRAA 106 07/27/2018 1451   GFRAA >89 11/28/2014 1450   Lab Results  Component Value Date   CHOL 176 06/22/2018   HDL 65 06/22/2018   LDLCALC 98 06/22/2018   TRIG 64 06/22/2018   CHOLHDL 2.7 06/22/2018   Lab Results  Component Value Date   HGBA1C 6.9 (H) 07/27/2018   Lab Results  Component Value Date   VITAMINB12 1,790 (H) 01/09/2018   Lab Results  Component Value Date   TSH 2.810 02/05/2018      ASSESSMENT AND PLAN 57 y.o. year old female  has a past medical history of Allergy, Anemia, Clotting disorder ([place]), Diabetes mellitus, Hypertension, and Shortness of breath. here with :  1.  Obstructive sleep apnea on CPAP  The patient CPAP download indicates good compliance and treatment of her apnea.  She is encouraged to continue using the CPAP nightly and greater than 4 hours each night.  She will be referred for mask refitting.  She is advised that if her symptoms worsen or she develops new  symptoms she should let us know.  She will follow-up in 6 months or sooner if needed.  I spent 15 minutes with the patient. 50% of this time was spent reviewing CPAP download   Ward Givens, MSN, NP-C 08/27/2018, 11:43 AM [facility] [address], [place], [place] [address] [phone]  I reviewed the above note and documentation by the Nurse Practitioner and agree with the history, physical exam, assessment and plan as outlined above. I was immediately available for face-to-face consultation. Star Age, MD, PhD [facility])

## 2018-08-28 ENCOUNTER — Other Ambulatory Visit: Payer: Self-pay

## 2018-08-28 MED ORDER — EVOLOCUMAB 140 MG/ML ~~LOC~~ SOAJ: 140.0000 mg | SUBCUTANEOUS | 3 refills | Status: DC

## 2018-08-28 NOTE — Telephone Encounter (Signed)
Called and spoke to pt about her lab results.

## 2018-08-28 NOTE — Telephone Encounter (Signed)
Verbal order from [place] NP to prescribed Repatha 140mg /ml injection every 14 days sure click auto injector.

## 2018-08-31 ENCOUNTER — Telehealth: Payer: Self-pay

## 2018-08-31 ENCOUNTER — Ambulatory Visit: Payer: BLUE CROSS/BLUE SHIELD | Admitting: Physician Assistant

## 2018-08-31 NOTE — Telephone Encounter (Signed)
PA done on cover my meds. Repatha approve from  Effective from 08/31/2018 through 08/30/2019.

## 2018-09-02 DIAGNOSIS — G4733 Obstructive sleep apnea (adult) (pediatric): Secondary | ICD-10-CM | POA: Diagnosis not present

## 2018-10-03 DIAGNOSIS — G4733 Obstructive sleep apnea (adult) (pediatric): Secondary | ICD-10-CM | POA: Diagnosis not present

## 2018-10-26 ENCOUNTER — Ambulatory Visit (INDEPENDENT_AMBULATORY_CARE_PROVIDER_SITE_OTHER): Payer: BLUE CROSS/BLUE SHIELD | Admitting: Emergency Medicine

## 2018-10-26 ENCOUNTER — Other Ambulatory Visit: Payer: Self-pay

## 2018-10-26 ENCOUNTER — Encounter: Payer: Self-pay | Admitting: Emergency Medicine

## 2018-10-26 VITALS — BP 130/84 | HR 65 | Temp 98.0°F | Resp 16 | Ht 65.0 in | Wt 242.0 lb

## 2018-10-26 DIAGNOSIS — E118 Type 2 diabetes mellitus with unspecified complications: Secondary | ICD-10-CM | POA: Diagnosis not present

## 2018-10-26 DIAGNOSIS — I10 Essential (primary) hypertension: Secondary | ICD-10-CM

## 2018-10-26 DIAGNOSIS — E119 Type 2 diabetes mellitus without complications: Secondary | ICD-10-CM | POA: Diagnosis not present

## 2018-10-26 DIAGNOSIS — E1165 Type 2 diabetes mellitus with hyperglycemia: Secondary | ICD-10-CM | POA: Diagnosis not present

## 2018-10-26 LAB — POCT GLYCOSYLATED HEMOGLOBIN (HGB A1C): Hemoglobin A1C: 7.2 % — AB (ref 4.0–5.6)

## 2018-10-26 MED ORDER — BLOOD GLUCOSE MONITOR KIT: PACK | 0 refills | Status: DC

## 2018-10-26 NOTE — Progress Notes (Signed)
Lab Results  Component Value Date   HGBA1C 6.9 (H) 07/27/2018   BP Readings from Last 3 Encounters:  10/26/18 130/84  08/27/18 139/90  07/27/18 127/85   Lab Results  Component Value Date   CHOL 176 06/22/2018   HDL 65 06/22/2018   LDLCALC 98 06/22/2018   TRIG 64 06/22/2018   CHOLHDL 2.7 06/22/2018   Lindsay Travis 58 y.o.   Chief Complaint  Patient presents with  . Hyperlipidemia    3 month follow-up   . Diabetes    HISTORY OF PRESENT ILLNESS: This is a 58 y.o. female with history of diabetes and hypertension here for follow-up.  Doing well has no complaints.  Compliant with medications. Also has a history of arthritis to both her knees but no complaints today.  HPI   Prior to Admission medications   Medication Sig Start Date End Date Taking? Authorizing Provider  ACCU-CHEK GUIDE test strip USE TO CHECK FASTING BLOOD GLUCOSE ONCE DAILY 04/13/18  Yes Jaynee Eagles, PA-C  amLODipine (NORVASC) 5 MG tablet Take 1 tablet (5 mg total) by mouth daily. 06/04/18  Yes Jaynee Eagles, PA-C  aspirin 81 MG EC tablet Take 1 tablet (81 mg total) by mouth daily. 03/20/18  Yes Venancio Poisson, NP  blood glucose meter kit and supplies KIT Please check fasting blood sugar once daily. 02/05/18  Yes Jaynee Eagles, PA-C  clopidogrel (PLAVIX) 75 MG tablet  10/15/18  Yes [provider]  Evolocumab (REPATHA SURECLICK) 711 MG/ML SOAJ Inject 140 mg into the skin every 14 (fourteen) days. 08/28/18  Yes Venancio Poisson, NP  ezetimibe (ZETIA) 10 MG tablet Take 1 tablet (10 mg total) by mouth daily. 06/24/18  Yes Vanschaick, [name], NP  glipiZIDE (GLUCOTROL) 5 MG tablet TAKE 1 TABLET(5 MG) BY MOUTH TWICE DAILY BEFORE A MEAL 08/27/18  Yes Stallings, Zoe A, MD  hydrochlorothiazide (HYDRODIURIL) 25 MG tablet Take 25 mg by mouth as needed.   Yes [provider]    Allergies  Allergen Reactions  . Lisinopril Other (See Comments)    Did not feel good  . Losartan Other (See Comments)   headache  . Aspirin Other (See Comments)    Abdominal pain    Patient Active Problem List   Diagnosis Date Noted  . Cerebral thrombosis with cerebral infarction 01/10/2018  . Hyperlipidemia   . Lower extremity weakness 01/09/2018  . Abnormality of gait   . Type 2 diabetes mellitus with complication, without long-term current use of insulin ([place]) 08/07/2015  . Bilateral knee pain 05/26/2012  . Varicose veins of lower extremities with other complications 65/79/0383  . Fibroid, uterus 04/18/2011  . Hypertension 04/18/2011    Past Medical History:  Diagnosis Date  . Allergy   . Anemia   . Clotting disorder ([place])   . Diabetes mellitus    recent high blood sugar and has RX but not taking  med -b/c she doesn't think she  is diabetic, just had lots of sugar in her diet when  dx'd.  Marland Kitchen Hypertension   . Shortness of breath    on exertion- has low hgb    Past Surgical History:  Procedure Laterality Date  . ABDOMINAL HYSTERECTOMY  09/11/2011   Procedure: HYSTERECTOMY ABDOMINAL;  Surgeon: Frederico Hamman, MD;  Location: [facility];  Service: Gynecology;  Laterality: N/A;    Social History   Socioeconomic History  . Marital status: Divorced    Spouse name: Not on file  . Number of children: Not on file  .  Years of education: Not on file  . Highest education level: Not on file  Occupational History  . Not on file  Social Needs  . Financial resource strain: Not on file  . Food insecurity:    Worry: Not on file    Inability: Not on file  . Transportation needs:    Medical: Not on file    Non-medical: Not on file  Tobacco Use  . Smoking status: Never Smoker  . Smokeless tobacco: Never Used  Substance and Sexual Activity  . Alcohol use: No  . Drug use: No  . Sexual activity: Never  Lifestyle  . Physical activity:    Days per week: Not on file    Minutes per session: Not on file  . Stress: Not on file  Relationships  . Social connections:    Talks on phone: Not on file     Gets together: Not on file    Attends religious service: Not on file    Active member of club or organization: Not on file    Attends meetings of clubs or organizations: Not on file    Relationship status: Not on file  . Intimate partner violence:    Fear of current or ex partner: Not on file    Emotionally abused: Not on file    Physically abused: Not on file    Forced sexual activity: Not on file  Other Topics Concern  . Not on file  Social History Narrative  . Not on file    Family History  Problem Relation Age of Onset  . Hypertension Other      Review of Systems  Constitutional: Negative.  Negative for chills and fever.  HENT: Negative.  Negative for sore throat.   Eyes: Negative.  Negative for blurred vision and double vision.  Respiratory: Negative.  Negative for cough and shortness of breath.   Cardiovascular: Negative.  Negative for chest pain and palpitations.  Gastrointestinal: Negative.  Negative for abdominal pain, blood in stool, diarrhea, melena, nausea and vomiting.  Genitourinary: Negative.  Negative for dysuria and hematuria.  Skin: Negative.  Negative for rash.  Neurological: Negative.  Negative for dizziness, sensory change and speech change.  Endo/Heme/Allergies: Negative.   All other systems reviewed and are negative.    Vitals:   10/26/18 1354  BP: 130/84  Pulse: 65  Resp: 16  Temp: 98 F (36.7 C)  SpO2: 99%     Physical Exam Vitals signs reviewed.  HENT:     Head: Normocephalic and atraumatic.     Nose: Nose normal.     Mouth/Throat:     Mouth: Mucous membranes are moist.     Pharynx: Oropharynx is clear.  Eyes:     Extraocular Movements: Extraocular movements intact.     Pupils: Pupils are equal, round, and reactive to light.  Neck:     Musculoskeletal: Normal range of motion.  Cardiovascular:     Rate and Rhythm: Normal rate and regular rhythm.     Heart sounds: Normal heart sounds.  Pulmonary:     Effort: Pulmonary effort is  normal.     Breath sounds: Normal breath sounds.  Musculoskeletal: Normal range of motion.  Skin:    General: Skin is warm and dry.  Neurological:     General: No focal deficit present.     Mental Status: She is alert and oriented to person, place, and time.     Sensory: No sensory deficit.  Psychiatric:  Mood and Affect: Mood normal.        Behavior: Behavior normal.    Results for orders placed or performed in visit on 10/26/18 (from the past 24 hour(s))  POCT glycosylated hemoglobin (Hb A1C)     Status: Abnormal   Collection Time: 10/26/18  2:24 PM  Result Value Ref Range   Hemoglobin A1C 7.2 (A) 4.0 - 5.6 %   HbA1c POC (<> result, manual entry)     HbA1c, POC (prediabetic range)     HbA1c, POC (controlled diabetic range)      A total of 25 minutes was spent in the room with the patient, greater than 50% of which was in counseling/coordination of care regarding chronic medical conditions, management, medications, blood results, diet and nutrition, and need for follow-up.   ASSESSMENT & PLAN: Type 2 diabetes mellitus with complication, without long-term current use of insulin (HCC) Hemoglobin A1c today at 7.2, higher than last one at 6.9.  Patient states she has been noncompliant with diet.  Does not want to start a second medication.  Developed side effects to metformin in the past also.  Will try diet and exercise and repeat hemoglobin A1c in 3 months.  Continue glipizide twice a day.  Ernesta was seen today for hyperlipidemia and diabetes.  Diagnoses and all orders for this visit:  Type 2 diabetes mellitus with hyperglycemia, without long-term current use of insulin (HCC) -     POCT glycosylated hemoglobin (Hb A1C) -     Comprehensive metabolic panel -     blood glucose meter kit and supplies KIT; Please check fasting blood sugar once daily.  Essential hypertension -     Comprehensive metabolic panel  Type 2 diabetes mellitus with complication, without long-term  current use of insulin [facility])    Patient Instructions       If you have lab work done today you will be contacted with your lab results within the next 2 weeks.  If you have not heard from [place] then please contact us. The fastest way to get your results is to register for My Chart.   IF you received an x-ray today, you will receive an invoice from [facility]. Please contact [facility] at [phone] with questions or concerns regarding your invoice.   IF you received labwork today, you will receive an invoice from [facility]. Please contact [facility] at [phone] with questions or concerns regarding your invoice.   Our billing staff will not be able to assist you with questions regarding bills from these companies.  You will be contacted with the lab results as soon as they are available. The fastest way to get your results is to activate your My Chart account. Instructions are located on the last page of this paperwork. If you have not heard from [place] regarding the results in 2 weeks, please contact this office.      Diabetes Mellitus and Nutrition, Adult When you have diabetes (diabetes mellitus), it is very important to have healthy eating habits because your blood sugar (glucose) levels are greatly affected by what you eat and drink. Eating healthy foods in the appropriate amounts, at about the same times every day, can help you:  Control your blood glucose.  Lower your risk of heart disease.  Improve your blood pressure.  Reach or maintain a healthy weight. Every person with diabetes is different, and each person has different needs for a meal plan. Your health care provider may recommend that you work with a  diet and nutrition specialist (dietitian) to make a meal plan that is best for you. Your meal plan may vary depending on factors such as:  The calories you need.  The medicines you take.  Your weight.  Your blood glucose, blood pressure, and  cholesterol levels.  Your activity level.  Other health conditions you have, such as heart or kidney disease. How do carbohydrates affect me? Carbohydrates, also called carbs, affect your blood glucose level more than any other type of food. Eating carbs naturally raises the amount of glucose in your blood. Carb counting is a method for keeping track of how many carbs you eat. Counting carbs is important to keep your blood glucose at a healthy level, especially if you use insulin or take certain oral diabetes medicines. It is important to know how many carbs you can safely have in each meal. This is different for every person. Your dietitian can help you calculate how many carbs you should have at each meal and for each snack. Foods that contain carbs include:  Bread, cereal, rice, pasta, and crackers.  Potatoes and corn.  Peas, beans, and lentils.  Milk and yogurt.  Fruit and juice.  Desserts, such as cakes, cookies, ice cream, and candy. How does alcohol affect me? Alcohol can cause a sudden decrease in blood glucose (hypoglycemia), especially if you use insulin or take certain oral diabetes medicines. Hypoglycemia can be a life-threatening condition. Symptoms of hypoglycemia (sleepiness, dizziness, and confusion) are similar to symptoms of having too much alcohol. If your health care provider says that alcohol is safe for you, follow these guidelines:  Limit alcohol intake to no more than 1 drink per day for nonpregnant women and 2 drinks per day for men. One drink equals 12 oz of beer, 5 oz of wine, or 1 oz of hard liquor.  Do not drink on an empty stomach.  Keep yourself hydrated with water, diet soda, or unsweetened iced tea.  Keep in mind that regular soda, juice, and other mixers may contain a lot of sugar and must be counted as carbs. What are tips for following this plan?  Reading food labels  Start by checking the serving size on the "Nutrition Facts" label of packaged  foods and drinks. The amount of calories, carbs, fats, and other nutrients listed on the label is based on one serving of the item. Many items contain more than one serving per package.  Check the total grams (g) of carbs in one serving. You can calculate the number of servings of carbs in one serving by dividing the total carbs by 15. For example, if a food has 30 g of total carbs, it would be equal to 2 servings of carbs.  Check the number of grams (g) of saturated and trans fats in one serving. Choose foods that have low or no amount of these fats.  Check the number of milligrams (mg) of salt (sodium) in one serving. Most people should limit total sodium intake to less than 2,300 mg per day.  Always check the nutrition information of foods labeled as "low-fat" or "nonfat". These foods may be higher in added sugar or refined carbs and should be avoided.  Talk to your dietitian to identify your daily goals for nutrients listed on the label. Shopping  Avoid buying canned, premade, or processed foods. These foods tend to be high in fat, sodium, and added sugar.  Shop around the outside edge of the grocery store. This includes fresh fruits and  vegetables, bulk grains, fresh meats, and fresh dairy. Cooking  Use low-heat cooking methods, such as baking, instead of high-heat cooking methods like deep frying.  Cook using healthy oils, such as olive, canola, or sunflower oil.  Avoid cooking with butter, cream, or high-fat meats. Meal planning  Eat meals and snacks regularly, preferably at the same times every day. Avoid going long periods of time without eating.  Eat foods high in fiber, such as fresh fruits, vegetables, beans, and whole grains. Talk to your dietitian about how many servings of carbs you can eat at each meal.  Eat 4-6 ounces (oz) of lean protein each day, such as lean meat, chicken, fish, eggs, or tofu. One oz of lean protein is equal to: ? 1 oz of meat, chicken, or fish. ? 1  egg. ?  cup of tofu.  Eat some foods each day that contain healthy fats, such as avocado, nuts, seeds, and fish. Lifestyle  Check your blood glucose regularly.  Exercise regularly as told by your health care provider. This may include: ? 150 minutes of moderate-intensity or vigorous-intensity exercise each week. This could be brisk walking, biking, or water aerobics. ? Stretching and doing strength exercises, such as yoga or weightlifting, at least 2 times a week.  Take medicines as told by your health care provider.  Do not use any products that contain nicotine or tobacco, such as cigarettes and e-cigarettes. If you need help quitting, ask your health care provider.  Work with a Social worker or diabetes educator to identify strategies to manage stress and any emotional and social challenges. Questions to ask a health care provider  Do I need to meet with a diabetes educator?  Do I need to meet with a dietitian?  What number can I call if I have questions?  When are the best times to check my blood glucose? Where to find more information:  American Diabetes Association: diabetes.org  Academy of Nutrition and Dietetics: www.eatright.[employer] of Diabetes and Digestive and Kidney Diseases (NIH): [employer] Summary  A healthy meal plan will help you control your blood glucose and maintain a healthy lifestyle.  Working with a diet and nutrition specialist (dietitian) can help you make a meal plan that is best for you.  Keep in mind that carbohydrates (carbs) and alcohol have immediate effects on your blood glucose levels. It is important to count carbs and to use alcohol carefully. This information is not intended to replace advice given to you by your health care provider. Make sure you discuss any questions you have with your health care provider. Document Released: 05/23/2005 Document Revised: 03/26/2017 Document Reviewed: 09/30/2016 Elsevier Interactive  Patient Education  2019 Elsevier Inc.      Agustina Caroli, MD [facility]

## 2018-10-26 NOTE — Patient Instructions (Addendum)
   If you have lab work done today you will be contacted with your lab results within the next 2 weeks.  If you have not heard from us then please contact us. The fastest way to get your results is to register for My Chart.   IF you received an x-ray today, you will receive an invoice from [facility]. Please contact  Radiology at [phone] with questions or concerns regarding your invoice.   IF you received labwork today, you will receive an invoice from [facility]. Please contact [facility] at [phone] with questions or concerns regarding your invoice.   Our billing staff will not be able to assist you with questions regarding bills from these companies.  You will be contacted with the lab results as soon as they are available. The fastest way to get your results is to activate your My Chart account. Instructions are located on the last page of this paperwork. If you have not heard from us regarding the results in 2 weeks, please contact this office.     Diabetes Mellitus and Nutrition, Adult When you have diabetes (diabetes mellitus), it is very important to have healthy eating habits because your blood sugar (glucose) levels are greatly affected by what you eat and drink. Eating healthy foods in the appropriate amounts, at about the same times every day, can help you:  Control your blood glucose.  Lower your risk of heart disease.  Improve your blood pressure.  Reach or maintain a healthy weight. Every person with diabetes is different, and each person has different needs for a meal plan. Your health care provider may recommend that you work with a diet and nutrition specialist (dietitian) to make a meal plan that is best for you. Your meal plan may vary depending on factors such as:  The calories you need.  The medicines you take.  Your weight.  Your blood glucose, blood pressure, and cholesterol levels.  Your activity level.  Other health conditions  you have, such as heart or kidney disease. How do carbohydrates affect me? Carbohydrates, also called carbs, affect your blood glucose level more than any other type of food. Eating carbs naturally raises the amount of glucose in your blood. Carb counting is a method for keeping track of how many carbs you eat. Counting carbs is important to keep your blood glucose at a healthy level, especially if you use insulin or take certain oral diabetes medicines. It is important to know how many carbs you can safely have in each meal. This is different for every person. Your dietitian can help you calculate how many carbs you should have at each meal and for each snack. Foods that contain carbs include:  Bread, cereal, rice, pasta, and crackers.  Potatoes and corn.  Peas, beans, and lentils.  Milk and yogurt.  Fruit and juice.  Desserts, such as cakes, cookies, ice cream, and candy. How does alcohol affect me? Alcohol can cause a sudden decrease in blood glucose (hypoglycemia), especially if you use insulin or take certain oral diabetes medicines. Hypoglycemia can be a life-threatening condition. Symptoms of hypoglycemia (sleepiness, dizziness, and confusion) are similar to symptoms of having too much alcohol. If your health care provider says that alcohol is safe for you, follow these guidelines:  Limit alcohol intake to no more than 1 drink per day for nonpregnant women and 2 drinks per day for men. One drink equals 12 oz of beer, 5 oz of wine, or 1 oz of hard liquor.    Do not drink on an empty stomach.  Keep yourself hydrated with water, diet soda, or unsweetened iced tea.  Keep in mind that regular soda, juice, and other mixers may contain a lot of sugar and must be counted as carbs. What are tips for following this plan?  Reading food labels  Start by checking the serving size on the "Nutrition Facts" label of packaged foods and drinks. The amount of calories, carbs, fats, and other  nutrients listed on the label is based on one serving of the item. Many items contain more than one serving per package.  Check the total grams (g) of carbs in one serving. You can calculate the number of servings of carbs in one serving by dividing the total carbs by 15. For example, if a food has 30 g of total carbs, it would be equal to 2 servings of carbs.  Check the number of grams (g) of saturated and trans fats in one serving. Choose foods that have low or no amount of these fats.  Check the number of milligrams (mg) of salt (sodium) in one serving. Most people should limit total sodium intake to less than 2,300 mg per day.  Always check the nutrition information of foods labeled as "low-fat" or "nonfat". These foods may be higher in added sugar or refined carbs and should be avoided.  Talk to your dietitian to identify your daily goals for nutrients listed on the label. Shopping  Avoid buying canned, premade, or processed foods. These foods tend to be high in fat, sodium, and added sugar.  Shop around the outside edge of the grocery store. This includes fresh fruits and vegetables, bulk grains, fresh meats, and fresh dairy. Cooking  Use low-heat cooking methods, such as baking, instead of high-heat cooking methods like deep frying.  Cook using healthy oils, such as olive, canola, or sunflower oil.  Avoid cooking with butter, cream, or high-fat meats. Meal planning  Eat meals and snacks regularly, preferably at the same times every day. Avoid going long periods of time without eating.  Eat foods high in fiber, such as fresh fruits, vegetables, beans, and whole grains. Talk to your dietitian about how many servings of carbs you can eat at each meal.  Eat 4-6 ounces (oz) of lean protein each day, such as lean meat, chicken, fish, eggs, or tofu. One oz of lean protein is equal to: ? 1 oz of meat, chicken, or fish. ? 1 egg. ?  cup of tofu.  Eat some foods each day that contain  healthy fats, such as avocado, nuts, seeds, and fish. Lifestyle  Check your blood glucose regularly.  Exercise regularly as told by your health care provider. This may include: ? 150 minutes of moderate-intensity or vigorous-intensity exercise each week. This could be brisk walking, biking, or water aerobics. ? Stretching and doing strength exercises, such as yoga or weightlifting, at least 2 times a week.  Take medicines as told by your health care provider.  Do not use any products that contain nicotine or tobacco, such as cigarettes and e-cigarettes. If you need help quitting, ask your health care provider.  Work with a counselor or diabetes educator to identify strategies to manage stress and any emotional and social challenges. Questions to ask a health care provider  Do I need to meet with a diabetes educator?  Do I need to meet with a dietitian?  What number can I call if I have questions?  When are the best times to   check my blood glucose? Where to find more information:  American Diabetes Association: diabetes.org  Academy of Nutrition and Dietetics: www.eatright.org  National Institute of Diabetes and Digestive and Kidney Diseases (NIH): www.niddk.nih.gov Summary  A healthy meal plan will help you control your blood glucose and maintain a healthy lifestyle.  Working with a diet and nutrition specialist (dietitian) can help you make a meal plan that is best for you.  Keep in mind that carbohydrates (carbs) and alcohol have immediate effects on your blood glucose levels. It is important to count carbs and to use alcohol carefully. This information is not intended to replace advice given to you by your health care provider. Make sure you discuss any questions you have with your health care provider. Document Released: 05/23/2005 Document Revised: 03/26/2017 Document Reviewed: 09/30/2016 Elsevier Interactive Patient Education  2019 Elsevier Inc.  

## 2018-10-26 NOTE — Assessment & Plan Note (Signed)
Hemoglobin A1c today at 7.2, higher than last one at 6.9.  Patient states she has been noncompliant with diet.  Does not want to start a second medication.  Developed side effects to metformin in the past also.  Will try diet and exercise and repeat hemoglobin A1c in 3 months.  Continue glipizide twice a day.

## 2018-10-27 ENCOUNTER — Encounter: Payer: Self-pay | Admitting: Radiology

## 2018-10-27 LAB — COMPREHENSIVE METABOLIC PANEL
ALT: 23 IU/L (ref 0–32)
AST: 19 IU/L (ref 0–40)
Albumin/Globulin Ratio: 1.7 (ref 1.2–2.2)
Albumin: 4.4 g/dL (ref 3.8–4.9)
Alkaline Phosphatase: 69 IU/L (ref 39–117)
BUN/Creatinine Ratio: 16 (ref 9–23)
BUN: 12 mg/dL (ref 6–24)
Bilirubin Total: 0.2 mg/dL (ref 0.0–1.2)
CO2: 24 mmol/L (ref 20–29)
Calcium: 9.5 mg/dL (ref 8.7–10.2)
Chloride: 103 mmol/L (ref 96–106)
Creatinine, Ser: 0.76 mg/dL (ref 0.57–1.00)
GFR calc Af Amer: 101 mL/min/{1.73_m2} (ref >59)
GFR calc non Af Amer: 88 mL/min/{1.73_m2} (ref >59)
Globulin, Total: 2.6 g/dL (ref 1.5–4.5)
Glucose: 80 mg/dL (ref 65–99)
Potassium: 3.7 mmol/L (ref 3.5–5.2)
Sodium: 140 mmol/L (ref 134–144)
Total Protein: 7 g/dL (ref 6.0–8.5)

## 2018-11-03 DIAGNOSIS — G4733 Obstructive sleep apnea (adult) (pediatric): Secondary | ICD-10-CM | POA: Diagnosis not present

## 2018-11-14 ENCOUNTER — Other Ambulatory Visit: Payer: Self-pay | Admitting: Urgent Care

## 2018-11-17 ENCOUNTER — Emergency Department (HOSPITAL_COMMUNITY): Payer: BLUE CROSS/BLUE SHIELD

## 2018-11-17 ENCOUNTER — Other Ambulatory Visit (HOSPITAL_COMMUNITY): Payer: Self-pay

## 2018-11-17 ENCOUNTER — Encounter (HOSPITAL_COMMUNITY): Payer: Self-pay | Admitting: Emergency Medicine

## 2018-11-17 ENCOUNTER — Other Ambulatory Visit: Payer: Self-pay

## 2018-11-17 ENCOUNTER — Emergency Department (HOSPITAL_COMMUNITY)
Admission: EM | Admit: 2018-11-17 | Discharge: 2018-11-17 | Disposition: A | Discharge status: Home / Self Care | Payer: BLUE CROSS/BLUE SHIELD | Attending: Emergency Medicine | Admitting: Emergency Medicine

## 2018-11-17 DIAGNOSIS — S01511A Laceration without foreign body of lip, initial encounter: Secondary | ICD-10-CM | POA: Diagnosis not present

## 2018-11-17 DIAGNOSIS — R42 Dizziness and giddiness: Secondary | ICD-10-CM | POA: Diagnosis not present

## 2018-11-17 DIAGNOSIS — S3991XA Unspecified injury of abdomen, initial encounter: Secondary | ICD-10-CM | POA: Diagnosis not present

## 2018-11-17 DIAGNOSIS — S62320A Displaced fracture of shaft of second metacarpal bone, right hand, initial encounter for closed fracture: Secondary | ICD-10-CM

## 2018-11-17 DIAGNOSIS — I10 Essential (primary) hypertension: Secondary | ICD-10-CM | POA: Diagnosis not present

## 2018-11-17 DIAGNOSIS — Y92410 Unspecified street and highway as the place of occurrence of the external cause: Secondary | ICD-10-CM | POA: Insufficient documentation

## 2018-11-17 DIAGNOSIS — Y998 Other external cause status: Secondary | ICD-10-CM | POA: Diagnosis not present

## 2018-11-17 DIAGNOSIS — S0993XA Unspecified injury of face, initial encounter: Secondary | ICD-10-CM | POA: Diagnosis not present

## 2018-11-17 DIAGNOSIS — S62343A Nondisplaced fracture of base of third metacarpal bone, left hand, initial encounter for closed fracture: Secondary | ICD-10-CM | POA: Diagnosis not present

## 2018-11-17 DIAGNOSIS — S62310A Displaced fracture of base of second metacarpal bone, right hand, initial encounter for closed fracture: Secondary | ICD-10-CM | POA: Diagnosis not present

## 2018-11-17 DIAGNOSIS — Z7902 Long term (current) use of antithrombotics/antiplatelets: Secondary | ICD-10-CM | POA: Diagnosis not present

## 2018-11-17 DIAGNOSIS — R109 Unspecified abdominal pain: Secondary | ICD-10-CM | POA: Diagnosis not present

## 2018-11-17 DIAGNOSIS — R404 Transient alteration of awareness: Secondary | ICD-10-CM | POA: Diagnosis not present

## 2018-11-17 DIAGNOSIS — Y939 Activity, unspecified: Secondary | ICD-10-CM | POA: Insufficient documentation

## 2018-11-17 DIAGNOSIS — M79641 Pain in right hand: Secondary | ICD-10-CM

## 2018-11-17 DIAGNOSIS — Z79899 Other long term (current) drug therapy: Secondary | ICD-10-CM | POA: Diagnosis not present

## 2018-11-17 DIAGNOSIS — Z23 Encounter for immunization: Secondary | ICD-10-CM | POA: Diagnosis not present

## 2018-11-17 DIAGNOSIS — S6991XA Unspecified injury of right wrist, hand and finger(s), initial encounter: Secondary | ICD-10-CM | POA: Diagnosis present

## 2018-11-17 DIAGNOSIS — Z7982 Long term (current) use of aspirin: Secondary | ICD-10-CM | POA: Diagnosis not present

## 2018-11-17 DIAGNOSIS — R079 Chest pain, unspecified: Secondary | ICD-10-CM | POA: Insufficient documentation

## 2018-11-17 DIAGNOSIS — S0990XA Unspecified injury of head, initial encounter: Secondary | ICD-10-CM | POA: Insufficient documentation

## 2018-11-17 DIAGNOSIS — S299XXA Unspecified injury of thorax, initial encounter: Secondary | ICD-10-CM | POA: Diagnosis not present

## 2018-11-17 DIAGNOSIS — S199XXA Unspecified injury of neck, initial encounter: Secondary | ICD-10-CM | POA: Diagnosis not present

## 2018-11-17 DIAGNOSIS — Z7984 Long term (current) use of oral hypoglycemic drugs: Secondary | ICD-10-CM | POA: Insufficient documentation

## 2018-11-17 DIAGNOSIS — R52 Pain, unspecified: Secondary | ICD-10-CM | POA: Diagnosis not present

## 2018-11-17 DIAGNOSIS — M79661 Pain in right lower leg: Secondary | ICD-10-CM | POA: Diagnosis not present

## 2018-11-17 DIAGNOSIS — S62342A Nondisplaced fracture of base of third metacarpal bone, right hand, initial encounter for closed fracture: Secondary | ICD-10-CM | POA: Diagnosis not present

## 2018-11-17 DIAGNOSIS — R102 Pelvic and perineal pain: Secondary | ICD-10-CM | POA: Diagnosis not present

## 2018-11-17 DIAGNOSIS — M25461 Effusion, right knee: Secondary | ICD-10-CM | POA: Diagnosis not present

## 2018-11-17 DIAGNOSIS — E119 Type 2 diabetes mellitus without complications: Secondary | ICD-10-CM | POA: Diagnosis not present

## 2018-11-17 DIAGNOSIS — S3993XA Unspecified injury of pelvis, initial encounter: Secondary | ICD-10-CM | POA: Diagnosis not present

## 2018-11-17 DIAGNOSIS — S8991XA Unspecified injury of right lower leg, initial encounter: Secondary | ICD-10-CM | POA: Diagnosis not present

## 2018-11-17 LAB — CBC WITH DIFFERENTIAL/PLATELET
Abs Immature Granulocytes: 0.01 10*3/uL (ref 0.00–0.07)
Basophils Absolute: 0 10*3/uL (ref 0.0–0.1)
Basophils Relative: 0 %
EOS PCT: 1 %
Eosinophils Absolute: 0.1 10*3/uL (ref 0.0–0.5)
HCT: 38.1 % (ref 36.0–46.0)
Hemoglobin: 11.7 g/dL — ABNORMAL LOW (ref 12.0–15.0)
Immature Granulocytes: 0 %
Lymphocytes Relative: 28 %
Lymphs Abs: 1.3 10*3/uL (ref 0.7–4.0)
MCH: 26.2 pg (ref 26.0–34.0)
MCHC: 30.7 g/dL (ref 30.0–36.0)
MCV: 85.4 fL (ref 80.0–100.0)
MONOS PCT: 8 %
Monocytes Absolute: 0.4 10*3/uL (ref 0.1–1.0)
Neutro Abs: 2.9 10*3/uL (ref 1.7–7.7)
Neutrophils Relative %: 63 %
Platelets: 205 10*3/uL (ref 150–400)
RBC: 4.46 MIL/uL (ref 3.87–5.11)
RDW: 14.4 % (ref 11.5–15.5)
WBC: 4.7 10*3/uL (ref 4.0–10.5)
nRBC: 0 % (ref 0.0–0.2)

## 2018-11-17 LAB — BASIC METABOLIC PANEL
Anion gap: 7 (ref 5–15)
BUN: 15 mg/dL (ref 6–20)
CO2: 25 mmol/L (ref 22–32)
Calcium: 8.9 mg/dL (ref 8.9–10.3)
Chloride: 108 mmol/L (ref 98–111)
Creatinine, Ser: 0.94 mg/dL (ref 0.44–1.00)
GFR calc Af Amer: >60 mL/min (ref >60)
GFR calc non Af Amer: >60 mL/min (ref >60)
GLUCOSE: 120 mg/dL — AB (ref 70–99)
Potassium: 3.6 mmol/L (ref 3.5–5.1)
Sodium: 140 mmol/L (ref 135–145)

## 2018-11-17 LAB — PROTIME-INR
INR: 1 (ref 0.8–1.2)
Prothrombin Time: 13 seconds (ref 11.4–15.2)

## 2018-11-17 LAB — ETHANOL: Alcohol, Ethyl (B): <10 mg/dL (ref <10)

## 2018-11-17 LAB — ABO/RH: ABO/RH(D): O POS

## 2018-11-17 LAB — TYPE AND SCREEN
ABO/RH(D): O POS
Antibody Screen: NEGATIVE

## 2018-11-17 MED ORDER — LIDOCAINE HCL 2 % IJ SOLN
5.0000 mL | Freq: Once | INTRAMUSCULAR | Status: AC
  Administered 2018-11-17: 100 mg via INTRADERMAL
  Filled 2018-11-17: qty 20

## 2018-11-17 MED ORDER — NAPROXEN 500 MG PO TABS: 500.0000 mg | ORAL_TABLET | Freq: Two times a day (BID) | ORAL | 0 refills | Status: AC

## 2018-11-17 MED ORDER — HYDROCODONE-ACETAMINOPHEN 5-325 MG PO TABS: 2.0000 | ORAL_TABLET | Freq: Four times a day (QID) | ORAL | 0 refills | Status: AC | PRN

## 2018-11-17 MED ORDER — IOHEXOL 300 MG/ML  SOLN
100.0000 mL | Freq: Once | INTRAMUSCULAR | Status: AC | PRN
  Administered 2018-11-17: 100 mL via INTRAVENOUS

## 2018-11-17 MED ORDER — HYDROCODONE-ACETAMINOPHEN 5-325 MG PO TABS
1.0000 | ORAL_TABLET | Freq: Once | ORAL | Status: DC
  Filled 2018-11-17: qty 1

## 2018-11-17 MED ORDER — TETANUS-DIPHTH-ACELL PERTUSSIS 5-2.5-18.5 LF-MCG/0.5 IM SUSP
0.5000 mL | Freq: Once | INTRAMUSCULAR | Status: AC
  Administered 2018-11-17: 0.5 mL via INTRAMUSCULAR
  Filled 2018-11-17: qty 0.5

## 2018-11-17 MED ORDER — PENICILLIN V POTASSIUM 500 MG PO TABS: 500.0000 mg | ORAL_TABLET | Freq: Two times a day (BID) | ORAL | 0 refills | Status: AC

## 2018-11-17 NOTE — ED Notes (Signed)
Hematoma to right knee  Small laceration to right side of lip Right hand pain with swelling - strong radial pulses noted.

## 2018-11-17 NOTE — ED Notes (Signed)
Ortho notified of splint °

## 2018-11-17 NOTE — Progress Notes (Signed)
Orthopedic Tech Progress Note Patient Details:  Lindsay Travis [birth year] [id number]  Ortho Devices Type of Ortho Device: Arm sling, Volar splint Ortho Device/Splint Location: rue Ortho Device/Splint Interventions: Ordered, Application, Adjustment   Post Interventions Patient Tolerated: Well Instructions Provided: Care of device, Adjustment of device   Karolee Stamps 11/17/2018, 9:21 PM

## 2018-11-17 NOTE — Discharge Instructions (Signed)
Please take medications as prescribed  Wear splint on hand until you see Dr. Amedeo Plenty Please call Dr. Vanetta Shawl office in the morning to be evaluated for your hand Apply ice to your knee and elevate it for comfort  Please return to the ED for any worsening pain, fever, chills.
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
## 2018-11-17 NOTE — ED Notes (Signed)
Patient transported to X-ray 
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
## 2018-11-17 NOTE — ED Notes (Signed)
Pt. returned from XR. 

## 2018-11-19 DIAGNOSIS — S6291XA Unspecified fracture of right wrist and hand, initial encounter for closed fracture: Secondary | ICD-10-CM | POA: Diagnosis not present

## 2018-11-19 DIAGNOSIS — S8001XA Contusion of right knee, initial encounter: Secondary | ICD-10-CM | POA: Diagnosis not present

## 2018-11-19 DIAGNOSIS — T148XXA Other injury of unspecified body region, initial encounter: Secondary | ICD-10-CM | POA: Diagnosis not present

## 2018-11-19 DIAGNOSIS — M25561 Pain in right knee: Secondary | ICD-10-CM | POA: Diagnosis not present

## 2018-11-23 ENCOUNTER — Other Ambulatory Visit: Payer: Self-pay | Admitting: Family Medicine

## 2018-11-23 DIAGNOSIS — S62330A Displaced fracture of neck of second metacarpal bone, right hand, initial encounter for closed fracture: Secondary | ICD-10-CM | POA: Diagnosis not present

## 2018-11-24 ENCOUNTER — Inpatient Hospital Stay: Payer: BLUE CROSS/BLUE SHIELD | Admitting: Emergency Medicine

## 2018-12-02 DIAGNOSIS — G4733 Obstructive sleep apnea (adult) (pediatric): Secondary | ICD-10-CM | POA: Diagnosis not present

## 2018-12-02 DIAGNOSIS — S62642D Nondisplaced fracture of proximal phalanx of right middle finger, subsequent encounter for fracture with routine healing: Secondary | ICD-10-CM | POA: Diagnosis not present

## 2018-12-02 DIAGNOSIS — M79641 Pain in right hand: Secondary | ICD-10-CM | POA: Diagnosis not present

## 2018-12-02 DIAGNOSIS — S8001XD Contusion of right knee, subsequent encounter: Secondary | ICD-10-CM | POA: Diagnosis not present

## 2018-12-02 DIAGNOSIS — S62600D Fracture of unspecified phalanx of right index finger, subsequent encounter for fracture with routine healing: Secondary | ICD-10-CM | POA: Diagnosis not present

## 2018-12-02 DIAGNOSIS — S62201D Unspecified fracture of first metacarpal bone, right hand, subsequent encounter for fracture with routine healing: Secondary | ICD-10-CM | POA: Diagnosis not present

## 2018-12-09 DIAGNOSIS — S8011XD Contusion of right lower leg, subsequent encounter: Secondary | ICD-10-CM | POA: Diagnosis not present

## 2018-12-09 DIAGNOSIS — X58XXXD Exposure to other specified factors, subsequent encounter: Secondary | ICD-10-CM | POA: Diagnosis not present

## 2018-12-09 DIAGNOSIS — M79604 Pain in right leg: Secondary | ICD-10-CM | POA: Diagnosis not present

## 2018-12-09 DIAGNOSIS — M79641 Pain in right hand: Secondary | ICD-10-CM | POA: Diagnosis not present

## 2018-12-09 DIAGNOSIS — S8001XD Contusion of right knee, subsequent encounter: Secondary | ICD-10-CM | POA: Diagnosis not present

## 2018-12-09 DIAGNOSIS — S62330D Displaced fracture of neck of second metacarpal bone, right hand, subsequent encounter for fracture with routine healing: Secondary | ICD-10-CM | POA: Diagnosis not present

## 2018-12-14 DIAGNOSIS — L98492 Non-pressure chronic ulcer of skin of other sites with fat layer exposed: Secondary | ICD-10-CM | POA: Diagnosis not present

## 2018-12-15 DIAGNOSIS — S8001XD Contusion of right knee, subsequent encounter: Secondary | ICD-10-CM | POA: Diagnosis not present

## 2018-12-15 DIAGNOSIS — E11622 Type 2 diabetes mellitus with other skin ulcer: Secondary | ICD-10-CM | POA: Diagnosis not present

## 2018-12-15 DIAGNOSIS — S8001XA Contusion of right knee, initial encounter: Secondary | ICD-10-CM | POA: Diagnosis not present

## 2018-12-15 DIAGNOSIS — L97815 Non-pressure chronic ulcer of other part of right lower leg with muscle involvement without evidence of necrosis: Secondary | ICD-10-CM | POA: Diagnosis not present

## 2018-12-16 ENCOUNTER — Telehealth: Payer: Self-pay

## 2018-12-16 NOTE — Telephone Encounter (Signed)
DO NOT CANCEL PTS APPT.  Left vm for pt explaining not seeing pts in office due to COVID 19. We are offering video visit or telephone visit. We prefer video if they have computer, iphone or android or any phone with camera. Pt needs to download webex link and need consent for video and to file insurance for visit.

## 2018-12-21 DIAGNOSIS — S6291XA Unspecified fracture of right wrist and hand, initial encounter for closed fracture: Secondary | ICD-10-CM | POA: Diagnosis not present

## 2018-12-21 DIAGNOSIS — X58XXXA Exposure to other specified factors, initial encounter: Secondary | ICD-10-CM | POA: Diagnosis not present

## 2018-12-21 DIAGNOSIS — L97815 Non-pressure chronic ulcer of other part of right lower leg with muscle involvement without evidence of necrosis: Secondary | ICD-10-CM | POA: Diagnosis not present

## 2018-12-21 DIAGNOSIS — S62390A Other fracture of second metacarpal bone, right hand, initial encounter for closed fracture: Secondary | ICD-10-CM | POA: Diagnosis not present

## 2018-12-21 DIAGNOSIS — M79604 Pain in right leg: Secondary | ICD-10-CM | POA: Diagnosis not present

## 2018-12-21 DIAGNOSIS — S8001XD Contusion of right knee, subsequent encounter: Secondary | ICD-10-CM | POA: Diagnosis not present

## 2018-12-21 DIAGNOSIS — E11622 Type 2 diabetes mellitus with other skin ulcer: Secondary | ICD-10-CM | POA: Diagnosis not present

## 2018-12-21 DIAGNOSIS — S62330A Displaced fracture of neck of second metacarpal bone, right hand, initial encounter for closed fracture: Secondary | ICD-10-CM | POA: Diagnosis not present

## 2018-12-22 ENCOUNTER — Other Ambulatory Visit: Payer: Self-pay | Admitting: Emergency Medicine

## 2018-12-22 ENCOUNTER — Ambulatory Visit: Payer: BLUE CROSS/BLUE SHIELD | Admitting: Adult Health

## 2018-12-22 NOTE — Telephone Encounter (Signed)
Requested Prescriptions  Pending Prescriptions Disp Refills  . Accu-Chek FastClix Lancets MISC [Pharmacy Med Name: ACCU-CHEK FASTCLIX LANCETS [id number]] 633 each 5    Sig: USE TO TEST FASTING BLOOD GLUCOSE ONCE DAILY     Endocrinology: Diabetes - Testing Supplies Passed - 12/22/2018  2:01 PM      Passed - Valid encounter within last 12 months    Recent Outpatient Visits          1 month ago Type 2 diabetes mellitus with hyperglycemia, without long-term current use of insulin [facility])   Primary Care at [facility], [place], [place]   4 months ago Type 2 diabetes mellitus without complication, without long-term current use of insulin [facility])   Primary Care at [facility], [place], [place]   6 months ago Essential hypertension   Primary Care at [place], [place]   9 months ago Essential hypertension   Primary Care at [place], [place]   10 months ago Other fatigue   Primary Care at [facility], [place], [place]      Future Appointments            In 1 month Sagardia, Ines Bloomer, MD Primary Care at [place], [facility]

## 2018-12-22 NOTE — Telephone Encounter (Signed)
Left 2nd vm for pt this am about her visit at 1115 with [name] NP. I explained we are doing video visit with pt due to COVID 19. Explain she will need a phone that has a camera on and she will need to download the webex link.PT needs to give consent to do video visit and file insurance.,

## 2018-12-28 DIAGNOSIS — Z7901 Long term (current) use of anticoagulants: Secondary | ICD-10-CM | POA: Diagnosis not present

## 2018-12-28 DIAGNOSIS — E11622 Type 2 diabetes mellitus with other skin ulcer: Secondary | ICD-10-CM | POA: Diagnosis not present

## 2018-12-28 DIAGNOSIS — L97815 Non-pressure chronic ulcer of other part of right lower leg with muscle involvement without evidence of necrosis: Secondary | ICD-10-CM | POA: Diagnosis not present

## 2018-12-28 DIAGNOSIS — S8001XD Contusion of right knee, subsequent encounter: Secondary | ICD-10-CM | POA: Diagnosis not present

## 2018-12-28 DIAGNOSIS — L98499 Non-pressure chronic ulcer of skin of other sites with unspecified severity: Secondary | ICD-10-CM | POA: Diagnosis not present

## 2018-12-28 DIAGNOSIS — Z7984 Long term (current) use of oral hypoglycemic drugs: Secondary | ICD-10-CM | POA: Diagnosis not present

## 2018-12-29 DIAGNOSIS — L97815 Non-pressure chronic ulcer of other part of right lower leg with muscle involvement without evidence of necrosis: Secondary | ICD-10-CM | POA: Diagnosis not present

## 2019-01-02 DIAGNOSIS — G4733 Obstructive sleep apnea (adult) (pediatric): Secondary | ICD-10-CM | POA: Diagnosis not present

## 2019-01-04 DIAGNOSIS — E11622 Type 2 diabetes mellitus with other skin ulcer: Secondary | ICD-10-CM | POA: Diagnosis not present

## 2019-01-04 DIAGNOSIS — S8001XD Contusion of right knee, subsequent encounter: Secondary | ICD-10-CM | POA: Diagnosis not present

## 2019-01-04 DIAGNOSIS — L97812 Non-pressure chronic ulcer of other part of right lower leg with fat layer exposed: Secondary | ICD-10-CM | POA: Diagnosis not present

## 2019-01-04 DIAGNOSIS — L97815 Non-pressure chronic ulcer of other part of right lower leg with muscle involvement without evidence of necrosis: Secondary | ICD-10-CM | POA: Diagnosis not present

## 2019-01-04 DIAGNOSIS — S6291XA Unspecified fracture of right wrist and hand, initial encounter for closed fracture: Secondary | ICD-10-CM | POA: Diagnosis not present

## 2019-01-04 DIAGNOSIS — S8001XA Contusion of right knee, initial encounter: Secondary | ICD-10-CM | POA: Diagnosis not present

## 2019-01-04 DIAGNOSIS — G5601 Carpal tunnel syndrome, right upper limb: Secondary | ICD-10-CM | POA: Diagnosis not present

## 2019-01-04 DIAGNOSIS — X58XXXA Exposure to other specified factors, initial encounter: Secondary | ICD-10-CM | POA: Diagnosis not present

## 2019-01-11 DIAGNOSIS — X58XXXD Exposure to other specified factors, subsequent encounter: Secondary | ICD-10-CM | POA: Diagnosis not present

## 2019-01-11 DIAGNOSIS — E11622 Type 2 diabetes mellitus with other skin ulcer: Secondary | ICD-10-CM | POA: Diagnosis not present

## 2019-01-11 DIAGNOSIS — S8001XD Contusion of right knee, subsequent encounter: Secondary | ICD-10-CM | POA: Diagnosis not present

## 2019-01-11 DIAGNOSIS — M79641 Pain in right hand: Secondary | ICD-10-CM | POA: Diagnosis not present

## 2019-01-11 DIAGNOSIS — L97815 Non-pressure chronic ulcer of other part of right lower leg with muscle involvement without evidence of necrosis: Secondary | ICD-10-CM | POA: Diagnosis not present

## 2019-01-18 DIAGNOSIS — L97815 Non-pressure chronic ulcer of other part of right lower leg with muscle involvement without evidence of necrosis: Secondary | ICD-10-CM | POA: Diagnosis not present

## 2019-01-18 DIAGNOSIS — G5621 Lesion of ulnar nerve, right upper limb: Secondary | ICD-10-CM | POA: Diagnosis not present

## 2019-01-18 DIAGNOSIS — E11622 Type 2 diabetes mellitus with other skin ulcer: Secondary | ICD-10-CM | POA: Diagnosis not present

## 2019-01-18 DIAGNOSIS — G5601 Carpal tunnel syndrome, right upper limb: Secondary | ICD-10-CM | POA: Diagnosis not present

## 2019-01-18 DIAGNOSIS — S8001XD Contusion of right knee, subsequent encounter: Secondary | ICD-10-CM | POA: Diagnosis not present

## 2019-01-18 DIAGNOSIS — X58XXXD Exposure to other specified factors, subsequent encounter: Secondary | ICD-10-CM | POA: Diagnosis not present

## 2019-01-18 DIAGNOSIS — L98499 Non-pressure chronic ulcer of skin of other sites with unspecified severity: Secondary | ICD-10-CM | POA: Diagnosis not present

## 2019-01-21 ENCOUNTER — Ambulatory Visit: Payer: BLUE CROSS/BLUE SHIELD | Admitting: Emergency Medicine

## 2019-01-22 IMAGING — CR DG KNEE AP/LAT W/ SUNRISE*L*
3 series · 3 of 3 positions shown · non-contrast
Comparison: 05/26/2012

CLINICAL DATA: Knee pain for 1 month, no known injury, initial
encounter

EXAM:
LEFT KNEE 3 VIEWS

[w knee ap left]
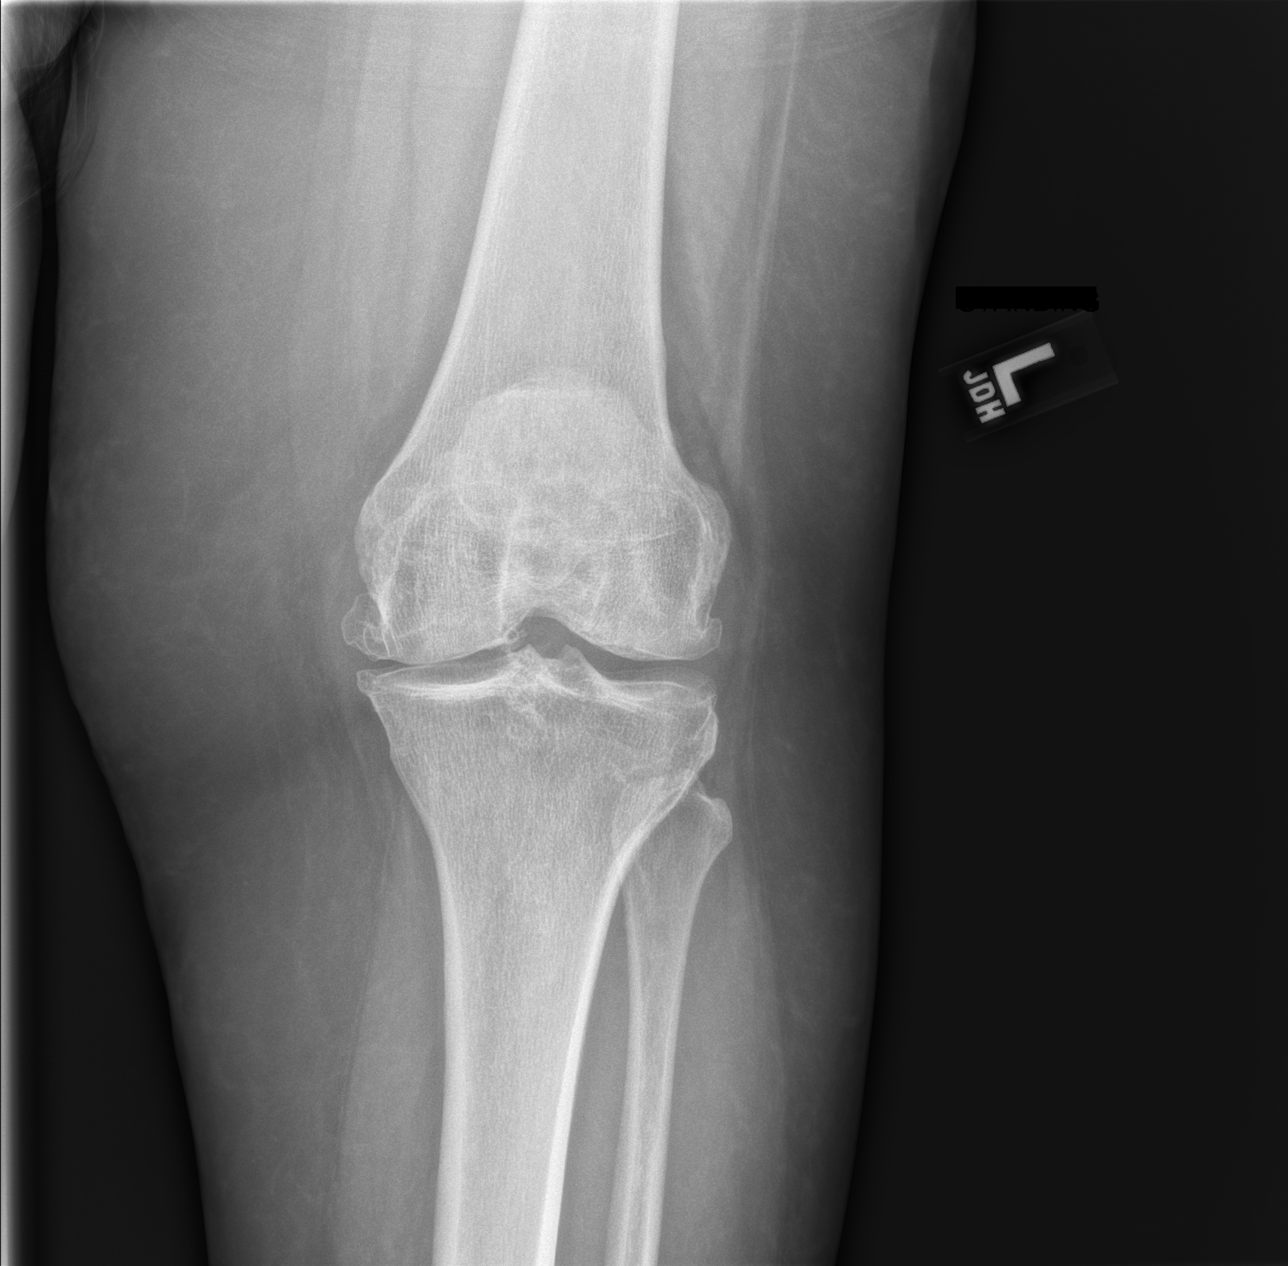

[w knee lat left]
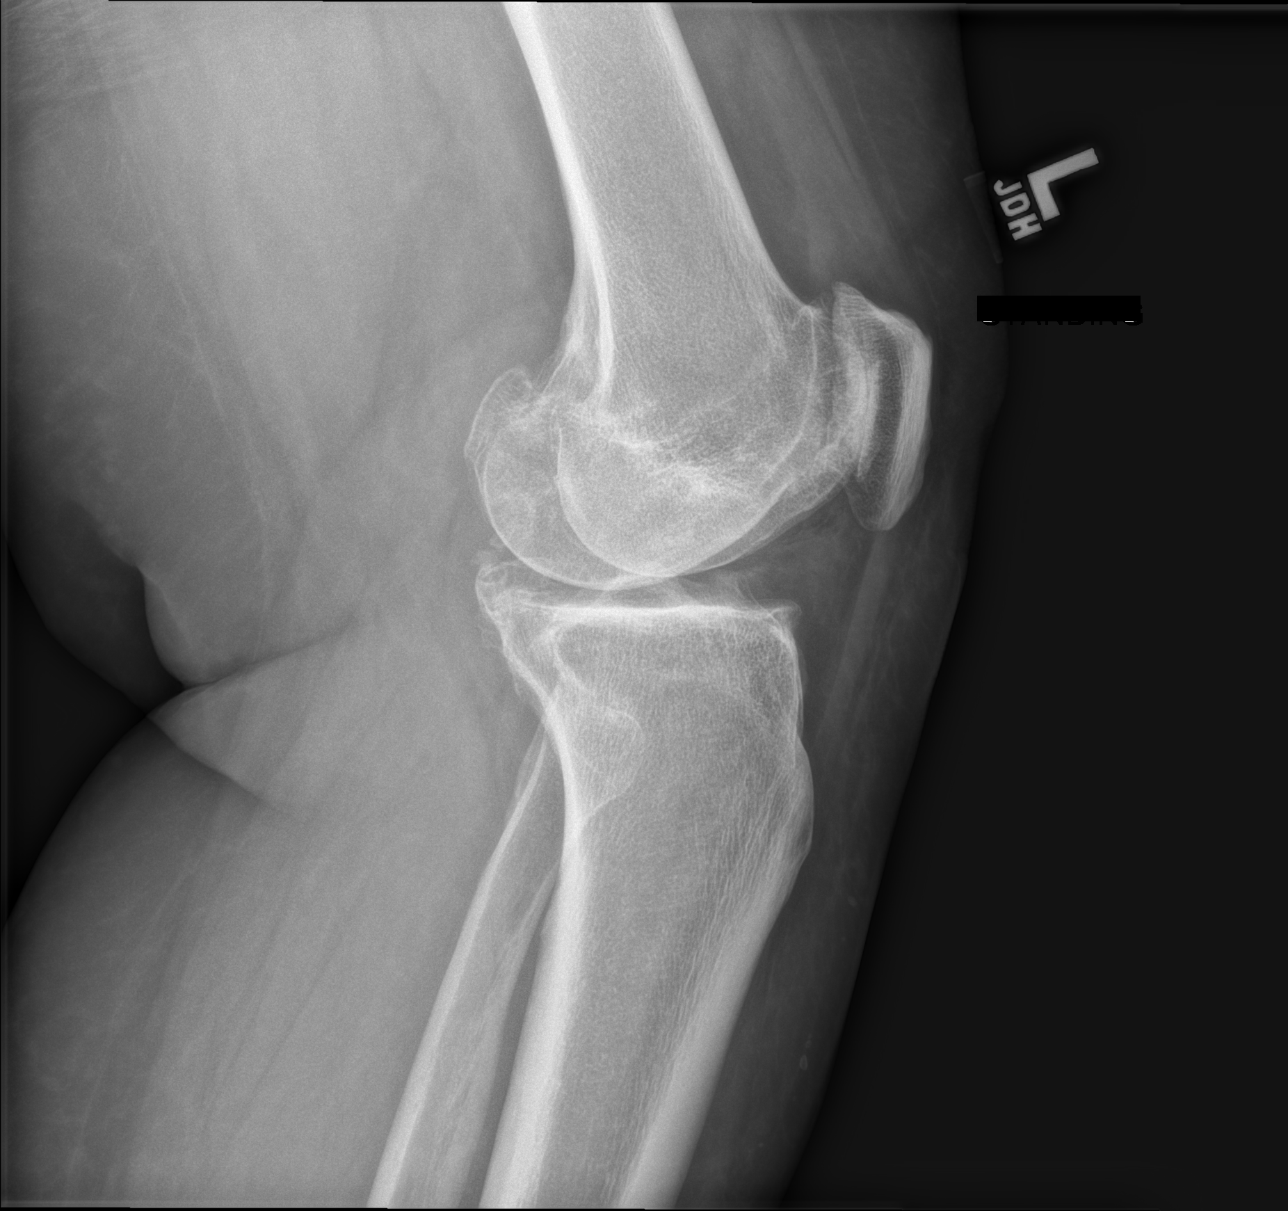

[x knee sunrise left]
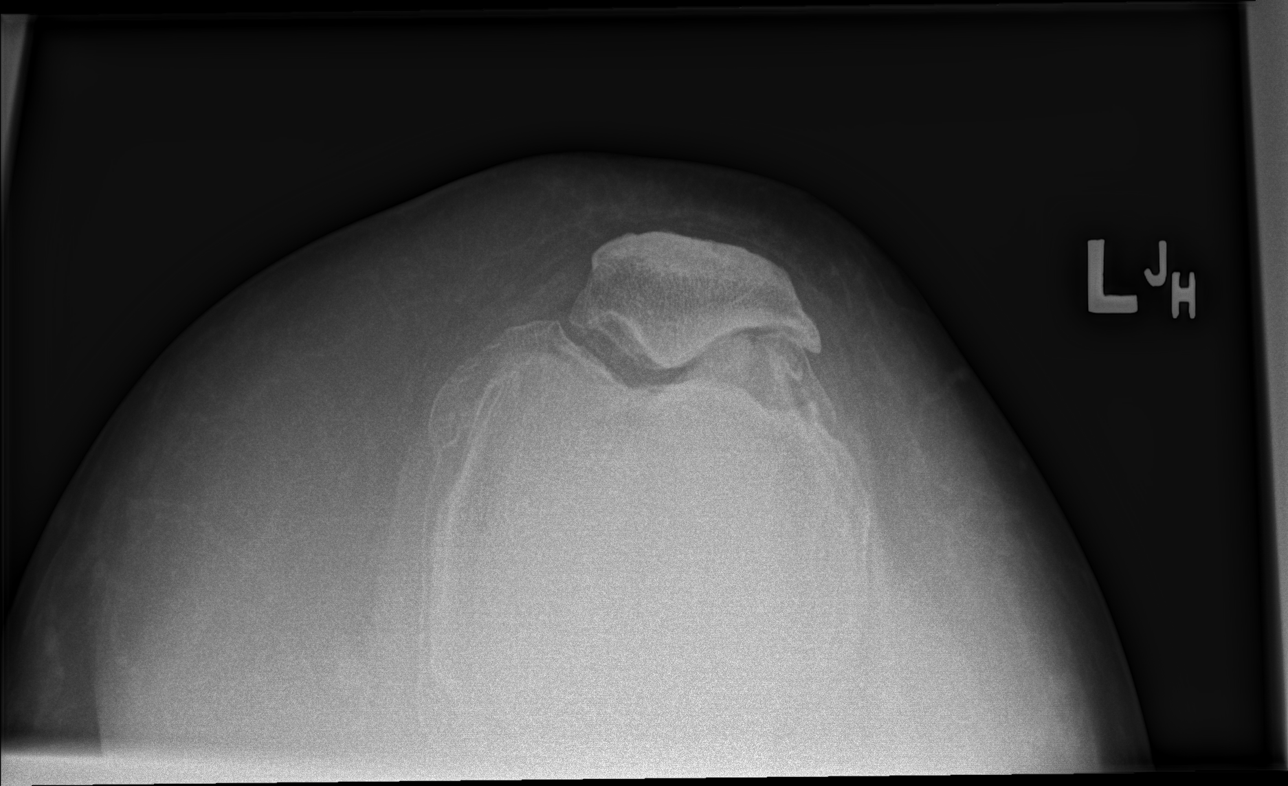

[3 of 3 positions shown; findings below may reference images not displayed]

FINDINGS: Tricompartmental degenerative changes are noted. These have
increased slightly in the interval from the prior exam. No joint
effusion is seen. No acute fracture is noted.
IMPRESSION: Slight progression of degenerative changes as described.

## 2019-01-25 ENCOUNTER — Ambulatory Visit: Payer: BLUE CROSS/BLUE SHIELD | Admitting: Emergency Medicine

## 2019-01-25 DIAGNOSIS — M79641 Pain in right hand: Secondary | ICD-10-CM | POA: Diagnosis not present

## 2019-01-25 DIAGNOSIS — S6291XD Unspecified fracture of right wrist and hand, subsequent encounter for fracture with routine healing: Secondary | ICD-10-CM | POA: Diagnosis not present

## 2019-01-25 DIAGNOSIS — G5611 Other lesions of median nerve, right upper limb: Secondary | ICD-10-CM | POA: Diagnosis not present

## 2019-01-25 DIAGNOSIS — R29898 Other symptoms and signs involving the musculoskeletal system: Secondary | ICD-10-CM | POA: Diagnosis not present

## 2019-01-25 DIAGNOSIS — M25641 Stiffness of right hand, not elsewhere classified: Secondary | ICD-10-CM | POA: Diagnosis not present

## 2019-01-25 DIAGNOSIS — G5601 Carpal tunnel syndrome, right upper limb: Secondary | ICD-10-CM | POA: Diagnosis not present

## 2019-01-25 DIAGNOSIS — X58XXXD Exposure to other specified factors, subsequent encounter: Secondary | ICD-10-CM | POA: Diagnosis not present

## 2019-01-25 DIAGNOSIS — G5621 Lesion of ulnar nerve, right upper limb: Secondary | ICD-10-CM | POA: Diagnosis not present

## 2019-01-27 ENCOUNTER — Other Ambulatory Visit: Payer: Self-pay | Admitting: Family Medicine

## 2019-02-01 DIAGNOSIS — G4733 Obstructive sleep apnea (adult) (pediatric): Secondary | ICD-10-CM | POA: Diagnosis not present

## 2019-02-01 IMAGING — CT CT ANGIO NECK
1 of 12 series · 4 of 33 positions shown · IV contrast (iopamidol)
Comparison: None.

CLINICAL DATA: Right centrum semi ovale and internal capsule
acute/subacute nonhemorrhagic white matter infarcts.

EXAM:
CT ANGIOGRAPHY HEAD AND NECK
TECHNIQUE: Multidetector CT imaging of the head and neck was performed using
the standard protocol during bolus administration of intravenous
contrast. Multiplanar CT image reconstructions and MIPs were
obtained to evaluate the vascular anatomy. Carotid stenosis
measurements (when applicable) are obtained utilizing NASCET
criteria, using the distal internal carotid diameter as the
denominator.
CONTRAST:  50mL [id number] IOPAMIDOL ([id number]) INJECTION 76%

[Series 11: cta neck axial · axial · 0.47mm/px · z∈[-226,-13]mm · 4 of 357 slices shown]
[im 72/357  soft-tissue]
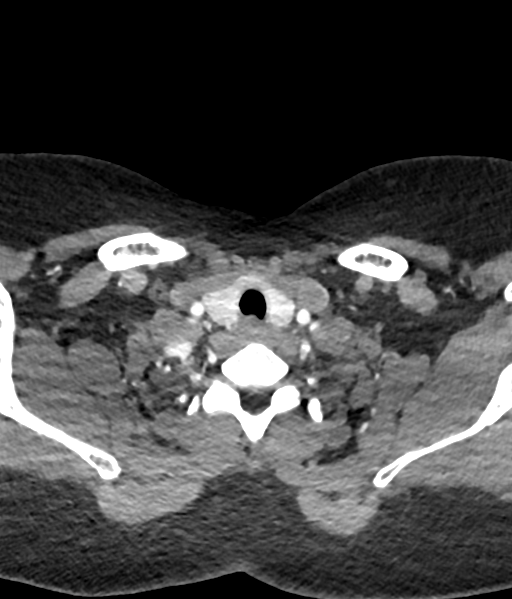
[im 143/357  bone]
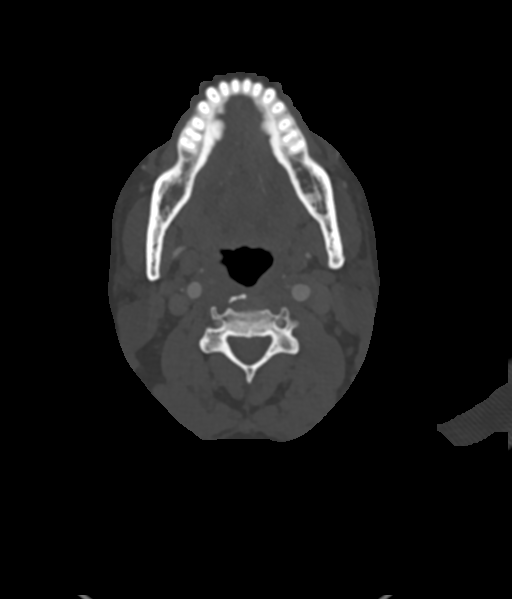
[im 214/357  soft-tissue]
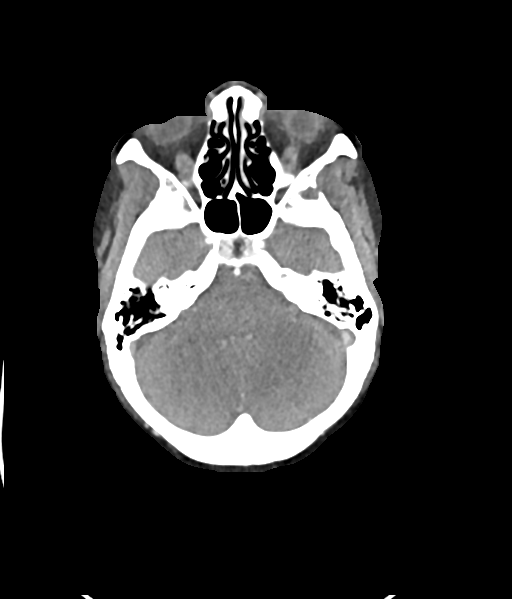
[im 285/357  bone]
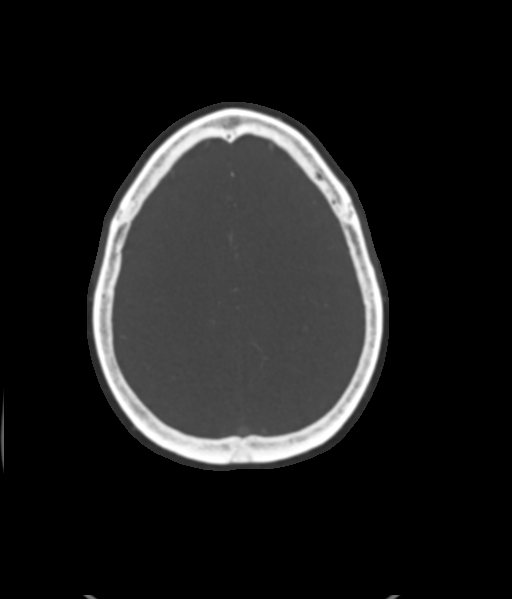

[4 of 33 positions shown; findings below may reference images not displayed]

FINDINGS: CT HEAD FINDINGS

Brain: White matter infarcts involving the right internal capsule
and centrum semi ovale are again noted. There is no interval
hemorrhage. No new infarcts are present. Basal ganglia are intact.
Insular ribbon is normal bilaterally. Ventricles are of normal size.
No significant extra-axial fluid collection is present. Brainstem
and cerebellum are normal.

Vascular: No hyperdense vessel or unexpected calcification.

Skull: Calvarium is intact. No acute or healing fractures are
present. No focal lytic or blastic lesions are evident.

Sinuses: The paranasal sinuses and mastoid air cells are clear.

Orbits: Globes and orbits are within normal limits.

Review of the MIP images confirms the above findings

CTA NECK FINDINGS

Aortic arch: A 3 vessel arch configuration is present. There is no
significant atherosclerotic disease or stenosis at the aortic arch.

Right carotid system: The right common carotid artery is within
normal limits. Noncalcified plaque is present bifurcation without a
significant stenosis. There is tortuosity of the cervical right ICA
without significant stenosis.

Left carotid system: The left common carotid artery is within normal
limits. Bifurcation is unremarkable. The cervical left ICA is
tortuous without significant stenosis.

Vertebral arteries: Vertebral arteries both originate from the
subclavian arteries without significant stenosis. There is proximal
tortuosity bilaterally. No focal stenosis is present in either
vertebral artery in the neck. Vertebral arteries are codominant.

Skeleton: Vertebral body heights and alignment are maintained. No
focal lytic or blastic lesions are present.

Other neck: The soft tissues the neck are otherwise unremarkable. No
focal mucosal or submucosal lesions are present. Salivary glands are
within normal limits. Thyroid is unremarkable. No significant
adenopathy is present.

Upper chest: Mild dependent atelectasis is present. Lung apices are
otherwise clear. The thoracic inlet is within normal limits.

Review of the MIP images confirms the above findings

CTA HEAD FINDINGS

Anterior circulation: Internal carotid arteries are within normal
limits from the skull base through the ICA termini bilaterally. The
right A1 and M1 segments are normal. The anterior communicating
artery is patent. There is a moderate proximal left M1 segment
stenosis. There is a moderate distal left A1 segment stenosis. MCA
bifurcations are intact. ACA and MCA branch vessels are
unremarkable.

Posterior circulation: The vertebral arteries are codominant. PICA
origins are visualized and normal. The right posterior cerebral
artery originates from basilar tip. The left posterior cerebral
artery is of fetal type. The PCA branch vessels are normal
bilaterally.

Venous sinuses: Dural sinuses are patent bilaterally. Straight sinus
and deep cerebral veins are intact. Cortical veins are unremarkable.

Anatomic variants: Fetal type left posterior cerebral artery.

Delayed phase: The postcontrast images demonstrate no pathologic
enhancement.

Review of the MIP images confirms the above findings
IMPRESSION: 1. Expected evolution of nonhemorrhagic infarcts involving the
anterior limb of the right internal capsule in the posterior centrum
semi ovale.
2. Moderate stenosis of the proximal left M1 segment. Distal MCA
branch vessels are intact.
3. Moderate stenosis of the distal left A1 segment. ACA branch
vessels are normal bilaterally with a patent anterior communicating
artery.
4. Atherosclerotic changes at the carotid bifurcations bilaterally
without significant stenosis.
5. Tortuosity of the cervical internal carotid arteries without
significant stenosis. This is nonspecific, but most commonly seen in
the setting of hypertension.

## 2019-02-02 DIAGNOSIS — S8001XD Contusion of right knee, subsequent encounter: Secondary | ICD-10-CM | POA: Diagnosis not present

## 2019-02-02 DIAGNOSIS — L98499 Non-pressure chronic ulcer of skin of other sites with unspecified severity: Secondary | ICD-10-CM | POA: Diagnosis not present

## 2019-02-02 DIAGNOSIS — L97815 Non-pressure chronic ulcer of other part of right lower leg with muscle involvement without evidence of necrosis: Secondary | ICD-10-CM | POA: Diagnosis not present

## 2019-02-02 DIAGNOSIS — E11622 Type 2 diabetes mellitus with other skin ulcer: Secondary | ICD-10-CM | POA: Diagnosis not present

## 2019-02-08 DIAGNOSIS — R29898 Other symptoms and signs involving the musculoskeletal system: Secondary | ICD-10-CM | POA: Diagnosis not present

## 2019-02-08 DIAGNOSIS — M25641 Stiffness of right hand, not elsewhere classified: Secondary | ICD-10-CM | POA: Diagnosis not present

## 2019-02-08 DIAGNOSIS — X58XXXD Exposure to other specified factors, subsequent encounter: Secondary | ICD-10-CM | POA: Diagnosis not present

## 2019-02-08 DIAGNOSIS — S6291XD Unspecified fracture of right wrist and hand, subsequent encounter for fracture with routine healing: Secondary | ICD-10-CM | POA: Diagnosis not present

## 2019-02-08 DIAGNOSIS — M79641 Pain in right hand: Secondary | ICD-10-CM | POA: Diagnosis not present

## 2019-02-15 DIAGNOSIS — M79641 Pain in right hand: Secondary | ICD-10-CM | POA: Diagnosis not present

## 2019-02-15 DIAGNOSIS — M25641 Stiffness of right hand, not elsewhere classified: Secondary | ICD-10-CM | POA: Diagnosis not present

## 2019-02-15 DIAGNOSIS — S8001XD Contusion of right knee, subsequent encounter: Secondary | ICD-10-CM | POA: Diagnosis not present

## 2019-02-15 DIAGNOSIS — I872 Venous insufficiency (chronic) (peripheral): Secondary | ICD-10-CM | POA: Diagnosis not present

## 2019-02-15 DIAGNOSIS — R29898 Other symptoms and signs involving the musculoskeletal system: Secondary | ICD-10-CM | POA: Diagnosis not present

## 2019-02-15 DIAGNOSIS — M65331 Trigger finger, right middle finger: Secondary | ICD-10-CM | POA: Diagnosis not present

## 2019-02-15 DIAGNOSIS — E11621 Type 2 diabetes mellitus with foot ulcer: Secondary | ICD-10-CM | POA: Diagnosis not present

## 2019-02-15 DIAGNOSIS — E11622 Type 2 diabetes mellitus with other skin ulcer: Secondary | ICD-10-CM | POA: Diagnosis not present

## 2019-02-15 DIAGNOSIS — L97815 Non-pressure chronic ulcer of other part of right lower leg with muscle involvement without evidence of necrosis: Secondary | ICD-10-CM | POA: Diagnosis not present

## 2019-02-15 DIAGNOSIS — S6291XD Unspecified fracture of right wrist and hand, subsequent encounter for fracture with routine healing: Secondary | ICD-10-CM | POA: Diagnosis not present

## 2019-02-15 DIAGNOSIS — X58XXXD Exposure to other specified factors, subsequent encounter: Secondary | ICD-10-CM | POA: Diagnosis not present

## 2019-02-15 DIAGNOSIS — M65321 Trigger finger, right index finger: Secondary | ICD-10-CM | POA: Diagnosis not present

## 2019-02-24 ENCOUNTER — Other Ambulatory Visit: Payer: Self-pay | Admitting: Family Medicine

## 2019-02-24 DIAGNOSIS — I83891 Varicose veins of right lower extremities with other complications: Secondary | ICD-10-CM | POA: Diagnosis not present

## 2019-02-24 DIAGNOSIS — E11622 Type 2 diabetes mellitus with other skin ulcer: Secondary | ICD-10-CM | POA: Diagnosis not present

## 2019-02-24 DIAGNOSIS — M7989 Other specified soft tissue disorders: Secondary | ICD-10-CM | POA: Diagnosis not present

## 2019-02-24 DIAGNOSIS — L97815 Non-pressure chronic ulcer of other part of right lower leg with muscle involvement without evidence of necrosis: Secondary | ICD-10-CM | POA: Diagnosis not present

## 2019-02-24 NOTE — Telephone Encounter (Signed)
Would Dr. Mitchel Honour like to take over these rxs?

## 2019-02-25 ENCOUNTER — Telehealth: Payer: Self-pay | Admitting: Neurology

## 2019-02-25 NOTE — Telephone Encounter (Signed)
Due to current COVID 19 pandemic, our office is severely reducing in office visits until further notice, in order to minimize the risk to our patients and healthcare providers.   Called patient to discuss virtual visit or in office visit for 6/22 appt. LVM requesting she call back, office contact info provided.

## 2019-03-01 ENCOUNTER — Ambulatory Visit: Payer: BLUE CROSS/BLUE SHIELD | Admitting: Neurology

## 2019-03-01 DIAGNOSIS — L97815 Non-pressure chronic ulcer of other part of right lower leg with muscle involvement without evidence of necrosis: Secondary | ICD-10-CM | POA: Diagnosis not present

## 2019-03-01 DIAGNOSIS — M25641 Stiffness of right hand, not elsewhere classified: Secondary | ICD-10-CM | POA: Diagnosis not present

## 2019-03-01 DIAGNOSIS — L97812 Non-pressure chronic ulcer of other part of right lower leg with fat layer exposed: Secondary | ICD-10-CM | POA: Diagnosis not present

## 2019-03-01 DIAGNOSIS — E11622 Type 2 diabetes mellitus with other skin ulcer: Secondary | ICD-10-CM | POA: Diagnosis not present

## 2019-03-01 DIAGNOSIS — R29898 Other symptoms and signs involving the musculoskeletal system: Secondary | ICD-10-CM | POA: Diagnosis not present

## 2019-03-01 DIAGNOSIS — M79641 Pain in right hand: Secondary | ICD-10-CM | POA: Diagnosis not present

## 2019-03-01 DIAGNOSIS — X58XXXD Exposure to other specified factors, subsequent encounter: Secondary | ICD-10-CM | POA: Diagnosis not present

## 2019-03-01 DIAGNOSIS — S6291XD Unspecified fracture of right wrist and hand, subsequent encounter for fracture with routine healing: Secondary | ICD-10-CM | POA: Diagnosis not present

## 2019-03-01 NOTE — Telephone Encounter (Signed)
Due to current COVID 19 pandemic, our office is severely reducing in office visits until further notice, in order to minimize the risk to our patients and healthcare providers.   I called patient again today regarding her 6/22 appt. Patient states that she has another appt already and will need to reschedule. Patient rescheduled for 6/23 in office.

## 2019-03-02 ENCOUNTER — Ambulatory Visit (INDEPENDENT_AMBULATORY_CARE_PROVIDER_SITE_OTHER): Payer: BLUE CROSS/BLUE SHIELD | Admitting: Neurology

## 2019-03-02 ENCOUNTER — Other Ambulatory Visit: Payer: Self-pay

## 2019-03-02 ENCOUNTER — Encounter: Payer: Self-pay | Admitting: Neurology

## 2019-03-02 VITALS — BP 140/94 | HR 70 | Temp 98.2°F | Ht 66.0 in | Wt 252.5 lb

## 2019-03-02 DIAGNOSIS — Z9989 Dependence on other enabling machines and devices: Secondary | ICD-10-CM | POA: Diagnosis not present

## 2019-03-02 DIAGNOSIS — G4733 Obstructive sleep apnea (adult) (pediatric): Secondary | ICD-10-CM | POA: Diagnosis not present

## 2019-03-02 NOTE — Patient Instructions (Addendum)
Please make an appointment with [facility] for a mask refit as your mask leaks.  Please also have them look at your machine as you indicate you are using it more than it is registering.  Please continue using your autoPAP regularly. While your insurance requires that you use CPAP at least 4 hours each night on 70% of the nights, I recommend, that you not skip any nights and use it throughout the night if you can. Getting used to CPAP or autoPAP and staying with the treatment long term does take time and patience and discipline. Untreated obstructive sleep apnea when it is moderate to severe can have an adverse impact on cardiovascular health and raise her risk for heart disease, arrhythmias, hypertension, congestive heart failure, stroke and diabetes. Untreated obstructive sleep apnea causes sleep disruption, nonrestorative sleep, and sleep deprivation. This can have an impact on your day to day functioning and cause daytime sleepiness and impairment of cognitive function, memory loss, mood disturbance, and problems focussing. Using CPAP regularly can improve these symptoms. you can see one of our nurse practitioners in 6 months.
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
## 2019-03-02 NOTE — Progress Notes (Signed)
Order for mask refit and machine check sent to [employer] via community message. Confirmation received that the order transmitted was successful.

## 2019-03-04 DIAGNOSIS — G4733 Obstructive sleep apnea (adult) (pediatric): Secondary | ICD-10-CM | POA: Diagnosis not present

## 2019-03-08 DIAGNOSIS — L03115 Cellulitis of right lower limb: Secondary | ICD-10-CM | POA: Diagnosis not present

## 2019-03-22 DIAGNOSIS — E11622 Type 2 diabetes mellitus with other skin ulcer: Secondary | ICD-10-CM | POA: Diagnosis not present

## 2019-03-22 DIAGNOSIS — Z7984 Long term (current) use of oral hypoglycemic drugs: Secondary | ICD-10-CM | POA: Diagnosis not present

## 2019-03-22 DIAGNOSIS — M65321 Trigger finger, right index finger: Secondary | ICD-10-CM | POA: Diagnosis not present

## 2019-03-22 DIAGNOSIS — L97812 Non-pressure chronic ulcer of other part of right lower leg with fat layer exposed: Secondary | ICD-10-CM | POA: Diagnosis not present

## 2019-03-22 DIAGNOSIS — G5601 Carpal tunnel syndrome, right upper limb: Secondary | ICD-10-CM | POA: Diagnosis not present

## 2019-03-22 DIAGNOSIS — S8001XD Contusion of right knee, subsequent encounter: Secondary | ICD-10-CM | POA: Diagnosis not present

## 2019-03-22 DIAGNOSIS — L97815 Non-pressure chronic ulcer of other part of right lower leg with muscle involvement without evidence of necrosis: Secondary | ICD-10-CM | POA: Diagnosis not present

## 2019-03-22 DIAGNOSIS — X58XXXD Exposure to other specified factors, subsequent encounter: Secondary | ICD-10-CM | POA: Diagnosis not present

## 2019-03-23 DIAGNOSIS — L97815 Non-pressure chronic ulcer of other part of right lower leg with muscle involvement without evidence of necrosis: Secondary | ICD-10-CM | POA: Diagnosis not present

## 2019-04-12 DIAGNOSIS — E11622 Type 2 diabetes mellitus with other skin ulcer: Secondary | ICD-10-CM | POA: Diagnosis not present

## 2019-04-12 DIAGNOSIS — L98499 Non-pressure chronic ulcer of skin of other sites with unspecified severity: Secondary | ICD-10-CM | POA: Diagnosis not present

## 2019-04-12 DIAGNOSIS — L97815 Non-pressure chronic ulcer of other part of right lower leg with muscle involvement without evidence of necrosis: Secondary | ICD-10-CM | POA: Diagnosis not present

## 2019-04-12 DIAGNOSIS — S8001XD Contusion of right knee, subsequent encounter: Secondary | ICD-10-CM | POA: Diagnosis not present

## 2019-04-12 DIAGNOSIS — I872 Venous insufficiency (chronic) (peripheral): Secondary | ICD-10-CM | POA: Diagnosis not present

## 2019-04-26 DIAGNOSIS — E119 Type 2 diabetes mellitus without complications: Secondary | ICD-10-CM | POA: Diagnosis not present

## 2019-04-26 DIAGNOSIS — H04123 Dry eye syndrome of bilateral lacrimal glands: Secondary | ICD-10-CM | POA: Diagnosis not present

## 2019-04-26 DIAGNOSIS — H35033 Hypertensive retinopathy, bilateral: Secondary | ICD-10-CM | POA: Diagnosis not present

## 2019-04-26 DIAGNOSIS — H25813 Combined forms of age-related cataract, bilateral: Secondary | ICD-10-CM | POA: Diagnosis not present

## 2019-04-28 ENCOUNTER — Ambulatory Visit (INDEPENDENT_AMBULATORY_CARE_PROVIDER_SITE_OTHER): Payer: BLUE CROSS/BLUE SHIELD | Admitting: Emergency Medicine

## 2019-04-28 ENCOUNTER — Other Ambulatory Visit: Payer: Self-pay

## 2019-04-28 ENCOUNTER — Encounter: Payer: Self-pay | Admitting: Emergency Medicine

## 2019-04-28 VITALS — BP 130/78 | HR 72 | Temp 97.7°F | Ht 66.0 in | Wt 253.4 lb

## 2019-04-28 DIAGNOSIS — I10 Essential (primary) hypertension: Secondary | ICD-10-CM | POA: Diagnosis not present

## 2019-04-28 DIAGNOSIS — Z8673 Personal history of transient ischemic attack (TIA), and cerebral infarction without residual deficits: Secondary | ICD-10-CM

## 2019-04-28 DIAGNOSIS — R42 Dizziness and giddiness: Secondary | ICD-10-CM | POA: Diagnosis not present

## 2019-04-28 DIAGNOSIS — E1165 Type 2 diabetes mellitus with hyperglycemia: Secondary | ICD-10-CM | POA: Diagnosis not present

## 2019-04-28 LAB — POCT URINALYSIS DIP (MANUAL ENTRY)
Bilirubin, UA: NEGATIVE
Glucose, UA: NEGATIVE mg/dL
Ketones, POC UA: NEGATIVE mg/dL
Leukocytes, UA: NEGATIVE
Nitrite, UA: NEGATIVE
Protein Ur, POC: NEGATIVE mg/dL
Spec Grav, UA: 1.02 (ref 1.010–1.025)
Urobilinogen, UA: 0.2 E.U./dL
pH, UA: 6.5 (ref 5.0–8.0)

## 2019-04-28 LAB — POCT GLYCOSYLATED HEMOGLOBIN (HGB A1C): Hemoglobin A1C: 8.5 % — AB (ref 4.0–5.6)

## 2019-04-28 LAB — GLUCOSE, POCT (MANUAL RESULT ENTRY): POC Glucose: 85 mg/dl (ref 70–99)

## 2019-04-28 MED ORDER — FARXIGA 5 MG PO TABS: 5.0000 mg | ORAL_TABLET | Freq: Every day | ORAL | 3 refills | Status: AC

## 2019-04-28 NOTE — Assessment & Plan Note (Signed)
Uncontrolled diabetes with hemoglobin A1c at 8.5.  Continue glipizide 5 mg twice a day.  Add Farxiga 5 mg once a day.  Diet and nutrition addressed with patient.  Follow-up in 3 months.

## 2019-04-28 NOTE — Patient Instructions (Addendum)
If you have lab work done today you will be contacted with your lab results within the next 2 weeks.  If you have not heard from [place] then please contact us. The fastest way to get your results is to register for My Chart.   IF you received an x-ray today, you will receive an invoice from [facility]. Please contact [facility] at [phone] with questions or concerns regarding your invoice.   IF you received labwork today, you will receive an invoice from [facility]. Please contact [facility] at [phone] with questions or concerns regarding your invoice.   Our billing staff will not be able to assist you with questions regarding bills from these companies.  You will be contacted with the lab results as soon as they are available. The fastest way to get your results is to activate your My Chart account. Instructions are located on the last page of this paperwork. If you have not heard from [place] regarding the results in 2 weeks, please contact this office.    Take Metamucil once a day.  Constipation, Adult Constipation is when a person:  Poops (has a bowel movement) fewer times in a week than normal.  Has a hard time pooping.  Has poop that is dry, hard, or bigger than normal. Follow these instructions at home: Eating and drinking   Eat foods that have a lot of fiber, such as: ? Fresh fruits and vegetables. ? Whole grains. ? Beans.  Eat less of foods that are high in fat, low in fiber, or overly processed, such as: ? [place] fries. ? Hamburgers. ? Cookies. ? Candy. ? Soda.  Drink enough fluid to keep your pee (urine) clear or pale yellow. General instructions  Exercise regularly or as told by your doctor.  Go to the restroom when you feel like you need to poop. Do not hold it in.  Take over-the-counter and prescription medicines only as told by your doctor. These include any fiber supplements.  Do pelvic floor retraining exercises, such  as: ? Doing deep breathing while relaxing your lower belly (abdomen). ? Relaxing your pelvic floor while pooping.  Watch your condition for any changes.  Keep all follow-up visits as told by your doctor. This is important. Contact a doctor if:  You have pain that gets worse.  You have a fever.  You have not pooped for 4 days.  You throw up (vomit).  You are not hungry.  You lose weight.  You are bleeding from the anus.  You have thin, pencil-like poop (stool). Get help right away if:  You have a fever, and your symptoms suddenly get worse.  You leak poop or have blood in your poop.  Your belly feels hard or bigger than normal (is bloated).  You have very bad belly pain.  You feel dizzy or you faint. This information is not intended to replace advice given to you by your health care provider. Make sure you discuss any questions you have with your health care provider. Document Released: 02/12/2008 Document Revised: 08/08/2017 Document Reviewed: 02/14/2016 Elsevier Patient Education  [place].  Diabetes Mellitus and Nutrition, Adult When you have diabetes (diabetes mellitus), it is very important to have healthy eating habits because your blood sugar (glucose) levels are greatly affected by what you eat and drink. Eating healthy foods in the appropriate amounts, at about the same times every day, can help you:  Control your blood glucose.  Lower your risk of heart  disease.  Improve your blood pressure.  Reach or maintain a healthy weight. Every person with diabetes is different, and each person has different needs for a meal plan. Your health care provider may recommend that you work with a diet and nutrition specialist (dietitian) to make a meal plan that is best for you. Your meal plan may vary depending on factors such as:  The calories you need.  The medicines you take.  Your weight.  Your blood glucose, blood pressure, and cholesterol  levels.  Your activity level.  Other health conditions you have, such as heart or kidney disease. How do carbohydrates affect me? Carbohydrates, also called carbs, affect your blood glucose level more than any other type of food. Eating carbs naturally raises the amount of glucose in your blood. Carb counting is a method for keeping track of how many carbs you eat. Counting carbs is important to keep your blood glucose at a healthy level, especially if you use insulin or take certain oral diabetes medicines. It is important to know how many carbs you can safely have in each meal. This is different for every person. Your dietitian can help you calculate how many carbs you should have at each meal and for each snack. Foods that contain carbs include:  Bread, cereal, rice, pasta, and crackers.  Potatoes and corn.  Peas, beans, and lentils.  Milk and yogurt.  Fruit and juice.  Desserts, such as cakes, cookies, ice cream, and candy. How does alcohol affect me? Alcohol can cause a sudden decrease in blood glucose (hypoglycemia), especially if you use insulin or take certain oral diabetes medicines. Hypoglycemia can be a life-threatening condition. Symptoms of hypoglycemia (sleepiness, dizziness, and confusion) are similar to symptoms of having too much alcohol. If your health care provider says that alcohol is safe for you, follow these guidelines:  Limit alcohol intake to no more than 1 drink per day for nonpregnant women and 2 drinks per day for men. One drink equals 12 oz of beer, 5 oz of wine, or 1 oz of hard liquor.  Do not drink on an empty stomach.  Keep yourself hydrated with water, diet soda, or unsweetened iced tea.  Keep in mind that regular soda, juice, and other mixers may contain a lot of sugar and must be counted as carbs. What are tips for following this plan?  Reading food labels  Start by checking the serving size on the "Nutrition Facts" label of packaged foods and  drinks. The amount of calories, carbs, fats, and other nutrients listed on the label is based on one serving of the item. Many items contain more than one serving per package.  Check the total grams (g) of carbs in one serving. You can calculate the number of servings of carbs in one serving by dividing the total carbs by 15. For example, if a food has 30 g of total carbs, it would be equal to 2 servings of carbs.  Check the number of grams (g) of saturated and trans fats in one serving. Choose foods that have low or no amount of these fats.  Check the number of milligrams (mg) of salt (sodium) in one serving. Most people should limit total sodium intake to less than 2,300 mg per day.  Always check the nutrition information of foods labeled as "low-fat" or "nonfat". These foods may be higher in added sugar or refined carbs and should be avoided.  Talk to your dietitian to identify your daily goals for nutrients  listed on the label. Shopping  Avoid buying canned, premade, or processed foods. These foods tend to be high in fat, sodium, and added sugar.  Shop around the outside edge of the grocery store. This includes fresh fruits and vegetables, bulk grains, fresh meats, and fresh dairy. Cooking  Use low-heat cooking methods, such as baking, instead of high-heat cooking methods like deep frying.  Cook using healthy oils, such as olive, canola, or sunflower oil.  Avoid cooking with butter, cream, or high-fat meats. Meal planning  Eat meals and snacks regularly, preferably at the same times every day. Avoid going long periods of time without eating.  Eat foods high in fiber, such as fresh fruits, vegetables, beans, and whole grains. Talk to your dietitian about how many servings of carbs you can eat at each meal.  Eat 4-6 ounces (oz) of lean protein each day, such as lean meat, chicken, fish, eggs, or tofu. One oz of lean protein is equal to: ? 1 oz of meat, chicken, or fish. ? 1 egg. ?   cup of tofu.  Eat some foods each day that contain healthy fats, such as avocado, nuts, seeds, and fish. Lifestyle  Check your blood glucose regularly.  Exercise regularly as told by your health care provider. This may include: ? 150 minutes of moderate-intensity or vigorous-intensity exercise each week. This could be brisk walking, biking, or water aerobics. ? Stretching and doing strength exercises, such as yoga or weightlifting, at least 2 times a week.  Take medicines as told by your health care provider.  Do not use any products that contain nicotine or tobacco, such as cigarettes and e-cigarettes. If you need help quitting, ask your health care provider.  Work with a Social worker or diabetes educator to identify strategies to manage stress and any emotional and social challenges. Questions to ask a health care provider  Do I need to meet with a diabetes educator?  Do I need to meet with a dietitian?  What number can I call if I have questions?  When are the best times to check my blood glucose? Where to find more information:  American Diabetes Association: diabetes.org  Academy of Nutrition and Dietetics: www.eatright.[employer] of Diabetes and Digestive and Kidney Diseases (NIH): [employer] Summary  A healthy meal plan will help you control your blood glucose and maintain a healthy lifestyle.  Working with a diet and nutrition specialist (dietitian) can help you make a meal plan that is best for you.  Keep in mind that carbohydrates (carbs) and alcohol have immediate effects on your blood glucose levels. It is important to count carbs and to use alcohol carefully. This information is not intended to replace advice given to you by your health care provider. Make sure you discuss any questions you have with your health care provider. Document Released: 05/23/2005 Document Revised: 08/08/2017 Document Reviewed: 09/30/2016 Elsevier Patient Education   2020 [employer].

## 2019-04-28 NOTE — Progress Notes (Signed)
Lab Results  Component Value Date   HGBA1C 7.2 (A) 10/26/2018   BP Readings from Last 3 Encounters:  03/02/19 (!) 140/94  11/17/18 126/66  10/26/18 130/84   Lab Results  Component Value Date   CHOL 176 06/22/2018   HDL 65 06/22/2018   LDLCALC 98 06/22/2018   TRIG 64 06/22/2018   CHOLHDL 2.7 06/22/2018   Wt Readings from Last 3 Encounters:  03/02/19 252 lb 8 oz (114.5 kg)  10/26/18 242 lb (109.8 kg)  08/27/18 236 lb 3.2 oz (107.1 kg)   Lindsay Travis 58 y.o.   Chief Complaint  Patient presents with   Chronic Conditions    6 m f/u    Dizziness    1 week     HISTORY OF PRESENT ILLNESS: This is a 58 y.o. female with history of diabetes and hypertension here for follow-up.  Also complaining of intermittent dizziness for 1 week.  Denies syncope.  Denies melena or rectal bleeding.  Denies fever or flulike symptoms.  No new medications.  Normal oral intake.  Denies nausea vomiting or diarrhea. #1.  Hypertension: Takes amlodipine 5 mg every day and hydro-chlorothiazide 25 mg as needed. #2.  Diabetes: Metformin intolerant.  Takes glipizide 5 mg twice a day.  Glucose at home 135-140. #3.  History of CVA: Baby aspirin daily.  No residual neuro deficits. #4.  History of dyslipidemia: Intolerant to statins due to diffuse muscle aches.  HPI   Prior to Admission medications   Medication Sig Start Date End Date Taking? Authorizing Provider  Accu-Chek FastClix Lancets MISC USE TO TEST FASTING BLOOD GLUCOSE ONCE DAILY 12/22/18  Yes Doneen Ollinger, Ines Bloomer, MD  ACCU-CHEK GUIDE test strip USE TO CHECK FASTING BLOOD GLUCOSE ONCE DAILY 04/13/18  Yes Jaynee Eagles, PA-C  amLODipine (NORVASC) 5 MG tablet TAKE 1 TABLET(5 MG) BY MOUTH DAILY 02/26/19  Yes Jacquez Sheetz, Ines Bloomer, MD  aspirin 81 MG EC tablet Take 1 tablet (81 mg total) by mouth daily. 03/20/18  Yes McCrue, [name], NP  blood glucose meter kit and supplies KIT Please check fasting blood sugar once daily. 10/26/18  Yes Adonias Demore, Ines Bloomer, MD  glipiZIDE (GLUCOTROL) 5 MG tablet TAKE 1 TABLET(5 MG) BY MOUTH TWICE DAILY BEFORE A MEAL 02/26/19  Yes Karon Cotterill, Ines Bloomer, MD  hydrochlorothiazide (HYDRODIURIL) 25 MG tablet TAKE 1 TABLET(25 MG) BY MOUTH DAILY 11/18/18  Yes Blayn Whetsell, Ines Bloomer, MD  gabapentin (NEURONTIN) 300 MG capsule Take 300 mg by mouth 3 (three) times daily. 1 cap 1st week, 2 caps 2 wks, 3 caps 3 wks, continue. Different Provider.    [provider]    Allergies  Allergen Reactions   Lisinopril Other (See Comments)    Did not feel good   Losartan Other (See Comments)    headache   Aspirin Other (See Comments)    Abdominal pain    Patient Active Problem List   Diagnosis Date Noted   Cerebral thrombosis with cerebral infarction 01/10/2018   Hyperlipidemia    Type 2 diabetes mellitus with complication, without long-term current use of insulin ([place]) 08/07/2015   Bilateral knee pain 05/26/2012   Varicose veins of lower extremities with other complications 54/27/0623   Fibroid, uterus 04/18/2011   Hypertension 04/18/2011    Past Medical History:  Diagnosis Date   Allergy    Anemia    Clotting disorder ([place])    Diabetes mellitus    recent high blood sugar and has RX but not taking  med -b/c she doesn't  think she  is diabetic, just had lots of sugar in her diet when  dx'd.   Hypertension    Shortness of breath    on exertion- has low hgb    Past Surgical History:  Procedure Laterality Date   ABDOMINAL HYSTERECTOMY  09/11/2011   Procedure: HYSTERECTOMY ABDOMINAL;  Surgeon: Frederico Hamman, MD;  Location: [facility];  Service: Gynecology;  Laterality: N/A;    Social History   Socioeconomic History   Marital status: Divorced    Spouse name: Not on file   Number of children: Not on file   Years of education: Not on file   Highest education level: Not on file  Occupational History   Not on file  Social Needs   Financial resource strain: Not on file   Food  insecurity    Worry: Not on file    Inability: Not on file   Transportation needs    Medical: Not on file    Non-medical: Not on file  Tobacco Use   Smoking status: Never Smoker   Smokeless tobacco: Never Used  Substance and Sexual Activity   Alcohol use: No   Drug use: No   Sexual activity: Never  Lifestyle   Physical activity    Days per week: Not on file    Minutes per session: Not on file   Stress: Not on file  Relationships   Social connections    Talks on phone: Not on file    Gets together: Not on file    Attends religious service: Not on file    Active member of club or organization: Not on file    Attends meetings of clubs or organizations: Not on file    Relationship status: Not on file   Intimate partner violence    Fear of current or ex partner: Not on file    Emotionally abused: Not on file    Physically abused: Not on file    Forced sexual activity: Not on file  Other Topics Concern   Not on file  Social History Narrative   Not on file    Family History  Problem Relation Age of Onset   Hypertension Other      Review of Systems  Constitutional: Negative.  Negative for chills and fever.  HENT: Negative.  Negative for congestion and sore throat.   Eyes: Negative.   Respiratory: Negative.  Negative for cough.   Cardiovascular: Negative.  Negative for chest pain and palpitations.  Gastrointestinal: Negative.  Negative for abdominal pain, blood in stool, diarrhea, melena, nausea and vomiting.  Genitourinary: Negative.  Negative for dysuria and hematuria.  Musculoskeletal: Positive for joint pain (Chronic knee pain). Negative for myalgias.  Skin: Negative.  Negative for rash.       Chronic wound to right knee, healing well, goes to wound care center regularly.  Neurological: Positive for dizziness (Intermittent episodes for 1 week).  Endo/Heme/Allergies: Negative.   Psychiatric/Behavioral: Negative for depression. The patient is not  nervous/anxious.   All other systems reviewed and are negative.  Today's Vitals   04/28/19 1436  BP: (!) 142/87  Pulse: 72  Temp: 97.7 F (36.5 C)  TempSrc: Oral  SpO2: 97%  Weight: 253 lb 6.4 oz (114.9 kg)  Height: _0  (1.676 m)   Body mass index is 40.9 kg/m.   Physical Exam Vitals signs reviewed.  Constitutional:      Appearance: She is obese.  HENT:     Head: Normocephalic and atraumatic.  Mouth/Throat:     Mouth: Mucous membranes are moist.     Pharynx: Oropharynx is clear.  Eyes:     Extraocular Movements: Extraocular movements intact.     Conjunctiva/sclera: Conjunctivae normal.     Pupils: Pupils are equal, round, and reactive to light.  Neck:     Musculoskeletal: Normal range of motion and neck supple.  Cardiovascular:     Rate and Rhythm: Normal rate.     Pulses: Normal pulses.     Heart sounds: Normal heart sounds.  Pulmonary:     Effort: Pulmonary effort is normal.     Breath sounds: Normal breath sounds.  Abdominal:     General: There is no distension.     Palpations: Abdomen is soft.     Tenderness: There is no abdominal tenderness.  Musculoskeletal: Normal range of motion.  Skin:    General: Skin is warm and dry.     Capillary Refill: Capillary refill takes less than 2 seconds.  Neurological:     General: No focal deficit present.     Mental Status: She is alert and oriented to person, place, and time.  Psychiatric:        Mood and Affect: Mood normal.        Behavior: Behavior normal.    Results for orders placed or performed in visit on 04/28/19 (from the past 24 hour(s))  POCT glucose (manual entry)     Status: None   Collection Time: 04/28/19  3:21 PM  Result Value Ref Range   POC Glucose 85 70 - 99 mg/dl  POCT urinalysis dipstick     Status: Abnormal   Collection Time: 04/28/19  3:22 PM  Result Value Ref Range   Color, UA yellow yellow   Clarity, UA clear clear   Glucose, UA negative negative mg/dL   Bilirubin, UA negative  negative   Ketones, POC UA negative negative mg/dL   Spec Grav, UA 1.020 1.010 - 1.025   Blood, UA small (A) negative   pH, UA 6.5 5.0 - 8.0   Protein Ur, POC negative negative mg/dL   Urobilinogen, UA 0.2 0.2 or 1.0 E.U./dL   Nitrite, UA Negative Negative   Leukocytes, UA Negative Negative  POCT glycosylated hemoglobin (Hb A1C)     Status: Abnormal   Collection Time: 04/28/19  3:24 PM  Result Value Ref Range   Hemoglobin A1C 8.5 (A) 4.0 - 5.6 %   HbA1c POC (<> result, manual entry)     HbA1c, POC (prediabetic range)     HbA1c, POC (controlled diabetic range)       ASSESSMENT & PLAN: Type 2 diabetes mellitus with hyperglycemia, without long-term current use of insulin (HCC) Uncontrolled diabetes with hemoglobin A1c at 8.5.  Continue glipizide 5 mg twice a day.  Add Farxiga 5 mg once a day.  Diet and nutrition addressed with patient.  Follow-up in 3 months.  Jenavie was seen today for chronic conditions and dizziness.  Diagnoses and all orders for this visit:  Type 2 diabetes mellitus with hyperglycemia, without long-term current use of insulin (HCC) -     Comprehensive metabolic panel -     Lipid panel -     POCT glucose (manual entry) -     POCT glycosylated hemoglobin (Hb A1C) -     dapagliflozin propanediol (FARXIGA) 5 MG TABS tablet; Take 5 mg by mouth daily before breakfast.  Essential hypertension -     Comprehensive metabolic panel  History of CVA (cerebrovascular  accident)  Morbid obesity ([place])  Dizziness -     POCT urinalysis dipstick    Patient Instructions       If you have lab work done today you will be contacted with your lab results within the next 2 weeks.  If you have not heard from [place] then please contact us. The fastest way to get your results is to register for My Chart.   IF you received an x-ray today, you will receive an invoice from [facility]. Please contact [facility] at [phone] with questions or concerns  regarding your invoice.   IF you received labwork today, you will receive an invoice from [facility]. Please contact [facility] at [phone] with questions or concerns regarding your invoice.   Our billing staff will not be able to assist you with questions regarding bills from these companies.  You will be contacted with the lab results as soon as they are available. The fastest way to get your results is to activate your My Chart account. Instructions are located on the last page of this paperwork. If you have not heard from [place] regarding the results in 2 weeks, please contact this office.    Take Metamucil once a day.  Constipation, Adult Constipation is when a person:  Poops (has a bowel movement) fewer times in a week than normal.  Has a hard time pooping.  Has poop that is dry, hard, or bigger than normal. Follow these instructions at home: Eating and drinking   Eat foods that have a lot of fiber, such as: ? Fresh fruits and vegetables. ? Whole grains. ? Beans.  Eat less of foods that are high in fat, low in fiber, or overly processed, such as: ? [place] fries. ? Hamburgers. ? Cookies. ? Candy. ? Soda.  Drink enough fluid to keep your pee (urine) clear or pale yellow. General instructions  Exercise regularly or as told by your doctor.  Go to the restroom when you feel like you need to poop. Do not hold it in.  Take over-the-counter and prescription medicines only as told by your doctor. These include any fiber supplements.  Do pelvic floor retraining exercises, such as: ? Doing deep breathing while relaxing your lower belly (abdomen). ? Relaxing your pelvic floor while pooping.  Watch your condition for any changes.  Keep all follow-up visits as told by your doctor. This is important. Contact a doctor if:  You have pain that gets worse.  You have a fever.  You have not pooped for 4 days.  You throw up (vomit).  You are not hungry.  You lose  weight.  You are bleeding from the anus.  You have thin, pencil-like poop (stool). Get help right away if:  You have a fever, and your symptoms suddenly get worse.  You leak poop or have blood in your poop.  Your belly feels hard or bigger than normal (is bloated).  You have very bad belly pain.  You feel dizzy or you faint. This information is not intended to replace advice given to you by your health care provider. Make sure you discuss any questions you have with your health care provider. Document Released: 02/12/2008 Document Revised: 08/08/2017 Document Reviewed: 02/14/2016 Elsevier Patient Education  [place].  Diabetes Mellitus and Nutrition, Adult When you have diabetes (diabetes mellitus), it is very important to have healthy eating habits because your blood sugar (glucose) levels are greatly affected by what you eat and drink. Eating healthy foods in  the appropriate amounts, at about the same times every day, can help you:  Control your blood glucose.  Lower your risk of heart disease.  Improve your blood pressure.  Reach or maintain a healthy weight. Every person with diabetes is different, and each person has different needs for a meal plan. Your health care provider may recommend that you work with a diet and nutrition specialist (dietitian) to make a meal plan that is best for you. Your meal plan may vary depending on factors such as:  The calories you need.  The medicines you take.  Your weight.  Your blood glucose, blood pressure, and cholesterol levels.  Your activity level.  Other health conditions you have, such as heart or kidney disease. How do carbohydrates affect me? Carbohydrates, also called carbs, affect your blood glucose level more than any other type of food. Eating carbs naturally raises the amount of glucose in your blood. Carb counting is a method for keeping track of how many carbs you eat. Counting carbs is important to keep  your blood glucose at a healthy level, especially if you use insulin or take certain oral diabetes medicines. It is important to know how many carbs you can safely have in each meal. This is different for every person. Your dietitian can help you calculate how many carbs you should have at each meal and for each snack. Foods that contain carbs include:  Bread, cereal, rice, pasta, and crackers.  Potatoes and corn.  Peas, beans, and lentils.  Milk and yogurt.  Fruit and juice.  Desserts, such as cakes, cookies, ice cream, and candy. How does alcohol affect me? Alcohol can cause a sudden decrease in blood glucose (hypoglycemia), especially if you use insulin or take certain oral diabetes medicines. Hypoglycemia can be a life-threatening condition. Symptoms of hypoglycemia (sleepiness, dizziness, and confusion) are similar to symptoms of having too much alcohol. If your health care provider says that alcohol is safe for you, follow these guidelines:  Limit alcohol intake to no more than 1 drink per day for nonpregnant women and 2 drinks per day for men. One drink equals 12 oz of beer, 5 oz of wine, or 1 oz of hard liquor.  Do not drink on an empty stomach.  Keep yourself hydrated with water, diet soda, or unsweetened iced tea.  Keep in mind that regular soda, juice, and other mixers may contain a lot of sugar and must be counted as carbs. What are tips for following this plan?  Reading food labels  Start by checking the serving size on the "Nutrition Facts" label of packaged foods and drinks. The amount of calories, carbs, fats, and other nutrients listed on the label is based on one serving of the item. Many items contain more than one serving per package.  Check the total grams (g) of carbs in one serving. You can calculate the number of servings of carbs in one serving by dividing the total carbs by 15. For example, if a food has 30 g of total carbs, it would be equal to 2 servings  of carbs.  Check the number of grams (g) of saturated and trans fats in one serving. Choose foods that have low or no amount of these fats.  Check the number of milligrams (mg) of salt (sodium) in one serving. Most people should limit total sodium intake to less than 2,300 mg per day.  Always check the nutrition information of foods labeled as "low-fat" or "nonfat". These foods may  be higher in added sugar or refined carbs and should be avoided.  Talk to your dietitian to identify your daily goals for nutrients listed on the label. Shopping  Avoid buying canned, premade, or processed foods. These foods tend to be high in fat, sodium, and added sugar.  Shop around the outside edge of the grocery store. This includes fresh fruits and vegetables, bulk grains, fresh meats, and fresh dairy. Cooking  Use low-heat cooking methods, such as baking, instead of high-heat cooking methods like deep frying.  Cook using healthy oils, such as olive, canola, or sunflower oil.  Avoid cooking with butter, cream, or high-fat meats. Meal planning  Eat meals and snacks regularly, preferably at the same times every day. Avoid going long periods of time without eating.  Eat foods high in fiber, such as fresh fruits, vegetables, beans, and whole grains. Talk to your dietitian about how many servings of carbs you can eat at each meal.  Eat 4-6 ounces (oz) of lean protein each day, such as lean meat, chicken, fish, eggs, or tofu. One oz of lean protein is equal to: ? 1 oz of meat, chicken, or fish. ? 1 egg. ?  cup of tofu.  Eat some foods each day that contain healthy fats, such as avocado, nuts, seeds, and fish. Lifestyle  Check your blood glucose regularly.  Exercise regularly as told by your health care provider. This may include: ? 150 minutes of moderate-intensity or vigorous-intensity exercise each week. This could be brisk walking, biking, or water aerobics. ? Stretching and doing strength  exercises, such as yoga or weightlifting, at least 2 times a week.  Take medicines as told by your health care provider.  Do not use any products that contain nicotine or tobacco, such as cigarettes and e-cigarettes. If you need help quitting, ask your health care provider.  Work with a Social worker or diabetes educator to identify strategies to manage stress and any emotional and social challenges. Questions to ask a health care provider  Do I need to meet with a diabetes educator?  Do I need to meet with a dietitian?  What number can I call if I have questions?  When are the best times to check my blood glucose? Where to find more information:  American Diabetes Association: diabetes.org  Academy of Nutrition and Dietetics: www.eatright.[employer] of Diabetes and Digestive and Kidney Diseases (NIH): [employer] Summary  A healthy meal plan will help you control your blood glucose and maintain a healthy lifestyle.  Working with a diet and nutrition specialist (dietitian) can help you make a meal plan that is best for you.  Keep in mind that carbohydrates (carbs) and alcohol have immediate effects on your blood glucose levels. It is important to count carbs and to use alcohol carefully. This information is not intended to replace advice given to you by your health care provider. Make sure you discuss any questions you have with your health care provider. Document Released: 05/23/2005 Document Revised: 08/08/2017 Document Reviewed: 09/30/2016 Elsevier Patient Education  2020 Elsevier Inc.      Agustina Caroli, MD [facility]

## 2019-04-29 LAB — COMPREHENSIVE METABOLIC PANEL
ALT: 17 IU/L (ref 0–32)
AST: 16 IU/L (ref 0–40)
Albumin/Globulin Ratio: 1.8 (ref 1.2–2.2)
Albumin: 4.3 g/dL (ref 3.8–4.9)
Alkaline Phosphatase: 72 IU/L (ref 39–117)
BUN/Creatinine Ratio: 16 (ref 9–23)
BUN: 16 mg/dL (ref 6–24)
Bilirubin Total: <0.2 mg/dL (ref 0.0–1.2)
CO2: 26 mmol/L (ref 20–29)
Calcium: 9.4 mg/dL (ref 8.7–10.2)
Chloride: 101 mmol/L (ref 96–106)
Creatinine, Ser: 0.99 mg/dL (ref 0.57–1.00)
GFR calc Af Amer: 73 mL/min/{1.73_m2} (ref >59)
GFR calc non Af Amer: 63 mL/min/{1.73_m2} (ref >59)
Globulin, Total: 2.4 g/dL (ref 1.5–4.5)
Glucose: 86 mg/dL (ref 65–99)
Potassium: 3.9 mmol/L (ref 3.5–5.2)
Sodium: 142 mmol/L (ref 134–144)
Total Protein: 6.7 g/dL (ref 6.0–8.5)

## 2019-04-29 LAB — LIPID PANEL
Chol/HDL Ratio: 3.3 ratio (ref 0.0–4.4)
Cholesterol, Total: 191 mg/dL (ref 100–199)
HDL: 58 mg/dL (ref >39)
LDL Calculated: 103 mg/dL — ABNORMAL HIGH (ref 0–99)
Triglycerides: 150 mg/dL — ABNORMAL HIGH (ref 0–149)
VLDL Cholesterol Cal: 30 mg/dL (ref 5–40)

## 2019-07-14 ENCOUNTER — Other Ambulatory Visit: Payer: Self-pay | Admitting: Emergency Medicine

## 2019-07-15 ENCOUNTER — Other Ambulatory Visit: Payer: Self-pay | Admitting: Emergency Medicine

## 2019-08-10 ENCOUNTER — Ambulatory Visit: Payer: BLUE CROSS/BLUE SHIELD | Admitting: Emergency Medicine

## 2019-08-11 ENCOUNTER — Other Ambulatory Visit: Payer: Self-pay

## 2019-08-11 ENCOUNTER — Encounter: Payer: Self-pay | Admitting: Emergency Medicine

## 2019-08-11 ENCOUNTER — Ambulatory Visit (INDEPENDENT_AMBULATORY_CARE_PROVIDER_SITE_OTHER): Payer: BLUE CROSS/BLUE SHIELD | Admitting: Emergency Medicine

## 2019-08-11 VITALS — BP 143/84 | HR 68 | Temp 97.7°F | Resp 16 | Ht 64.96 in | Wt 246.8 lb

## 2019-08-11 DIAGNOSIS — R079 Chest pain, unspecified: Secondary | ICD-10-CM | POA: Diagnosis not present

## 2019-08-11 DIAGNOSIS — E1159 Type 2 diabetes mellitus with other circulatory complications: Secondary | ICD-10-CM | POA: Diagnosis not present

## 2019-08-11 DIAGNOSIS — I10 Essential (primary) hypertension: Secondary | ICD-10-CM | POA: Diagnosis not present

## 2019-08-11 DIAGNOSIS — Z23 Encounter for immunization: Secondary | ICD-10-CM

## 2019-08-11 DIAGNOSIS — I152 Hypertension secondary to endocrine disorders: Secondary | ICD-10-CM

## 2019-08-11 LAB — GLUCOSE, POCT (MANUAL RESULT ENTRY): POC Glucose: 98 mg/dl (ref 70–99)

## 2019-08-11 LAB — POCT GLYCOSYLATED HEMOGLOBIN (HGB A1C): Hemoglobin A1C: 7.8 % — AB (ref 4.0–5.6)

## 2019-08-11 MED ORDER — FARXIGA 5 MG PO TABS: 5.0000 mg | ORAL_TABLET | Freq: Every day | ORAL | 3 refills | Status: DC

## 2019-08-11 NOTE — Patient Instructions (Addendum)
   If you have lab work done today you will be contacted with your lab results within the next 2 weeks.  If you have not heard from us then please contact us. The fastest way to get your results is to register for My Chart.   IF you received an x-ray today, you will receive an invoice from [facility]. Please contact [facility] at [phone] with questions or concerns regarding your invoice.   IF you received labwork today, you will receive an invoice from [facility]. Please contact [facility] at [phone] with questions or concerns regarding your invoice.   Our billing staff will not be able to assist you with questions regarding bills from these companies.  You will be contacted with the lab results as soon as they are available. The fastest way to get your results is to activate your My Chart account. Instructions are located on the last page of this paperwork. If you have not heard from us regarding the results in 2 weeks, please contact this office.     Hypertension, Adult High blood pressure (hypertension) is when the force of blood pumping through the arteries is too strong. The arteries are the blood vessels that carry blood from the heart throughout the body. Hypertension forces the heart to work harder to pump blood and may cause arteries to become narrow or stiff. Untreated or uncontrolled hypertension can cause a heart attack, heart failure, a stroke, kidney disease, and other problems. A blood pressure reading consists of a higher number over a lower number. Ideally, your blood pressure should be below 120/80. The first ("top") number is called the systolic pressure. It is a measure of the pressure in your arteries as your heart beats. The second ("bottom") number is called the diastolic pressure. It is a measure of the pressure in your arteries as the heart relaxes. What are the causes? The exact cause of this condition is not known. There are some conditions  that result in or are related to high blood pressure. What increases the risk? Some risk factors for high blood pressure are under your control. The following factors may make you more likely to develop this condition:  Smoking.  Having type 2 diabetes mellitus, high cholesterol, or both.  Not getting enough exercise or physical activity.  Being overweight.  Having too much fat, sugar, calories, or salt (sodium) in your diet.  Drinking too much alcohol. Some risk factors for high blood pressure may be difficult or impossible to change. Some of these factors include:  Having chronic kidney disease.  Having a family history of high blood pressure.  Age. Risk increases with age.  Race. You may be at higher risk if you are African American.  Gender. Men are at higher risk than women before age 45. After age 65, women are at higher risk than men.  Having obstructive sleep apnea.  Stress. What are the signs or symptoms? High blood pressure may not cause symptoms. Very high blood pressure (hypertensive crisis) may cause:  Headache.  Anxiety.  Shortness of breath.  Nosebleed.  Nausea and vomiting.  Vision changes.  Severe chest pain.  Seizures. How is this diagnosed? This condition is diagnosed by measuring your blood pressure while you are seated, with your arm resting on a flat surface, your legs uncrossed, and your feet flat on the floor. The cuff of the blood pressure monitor will be placed directly against the skin of your upper arm at the level of your heart.   It should be measured at least twice using the same arm. Certain conditions can cause a difference in blood pressure between your right and left arms. Certain factors can cause blood pressure readings to be lower or higher than normal for a short period of time:  When your blood pressure is higher when you are in a health care provider's office than when you are at home, this is called white coat hypertension.  Most people with this condition do not need medicines.  When your blood pressure is higher at home than when you are in a health care provider's office, this is called masked hypertension. Most people with this condition may need medicines to control blood pressure. If you have a high blood pressure reading during one visit or you have normal blood pressure with other risk factors, you may be asked to:  Return on a different day to have your blood pressure checked again.  Monitor your blood pressure at home for 1 week or longer. If you are diagnosed with hypertension, you may have other blood or imaging tests to help your health care provider understand your overall risk for other conditions. How is this treated? This condition is treated by making healthy lifestyle changes, such as eating healthy foods, exercising more, and reducing your alcohol intake. Your health care provider may prescribe medicine if lifestyle changes are not enough to get your blood pressure under control, and if:  Your systolic blood pressure is above 130.  Your diastolic blood pressure is above 80. Your personal target blood pressure may vary depending on your medical conditions, your age, and other factors. Follow these instructions at home: Eating and drinking   Eat a diet that is high in fiber and potassium, and low in sodium, added sugar, and fat. An example eating plan is called the DASH (Dietary Approaches to Stop Hypertension) diet. To eat this way: ? Eat plenty of fresh fruits and vegetables. Try to fill one half of your plate at each meal with fruits and vegetables. ? Eat whole grains, such as whole-wheat pasta, brown rice, or whole-grain bread. Fill about one fourth of your plate with whole grains. ? Eat or drink low-fat dairy products, such as skim milk or low-fat yogurt. ? Avoid fatty cuts of meat, processed or cured meats, and poultry with skin. Fill about one fourth of your plate with lean proteins, such  as fish, chicken without skin, beans, eggs, or tofu. ? Avoid pre-made and processed foods. These tend to be higher in sodium, added sugar, and fat.  Reduce your daily sodium intake. Most people with hypertension should eat less than 1,500 mg of sodium a day.  Do not drink alcohol if: ? Your health care provider tells you not to drink. ? You are pregnant, may be pregnant, or are planning to become pregnant.  If you drink alcohol: ? Limit how much you use to:  0-1 drink a day for women.  0-2 drinks a day for men. ? Be aware of how much alcohol is in your drink. In the [place] one drink equals one 12 oz bottle of beer (355 mL), one 5 oz glass of wine (148 mL), or one 1 oz glass of hard liquor (44 mL). Lifestyle   Work with your health care provider to maintain a healthy body weight or to lose weight. Ask what an ideal weight is for you.  Get at least 30 minutes of exercise most days of the week. Activities may include walking, swimming, or   biking.  Include exercise to strengthen your muscles (resistance exercise), such as Pilates or lifting weights, as part of your weekly exercise routine. Try to do these types of exercises for 30 minutes at least 3 days a week.  Do not use any products that contain nicotine or tobacco, such as cigarettes, e-cigarettes, and chewing tobacco. If you need help quitting, ask your health care provider.  Monitor your blood pressure at home as told by your health care provider.  Keep all follow-up visits as told by your health care provider. This is important. Medicines  Take over-the-counter and prescription medicines only as told by your health care provider. Follow directions carefully. Blood pressure medicines must be taken as prescribed.  Do not skip doses of blood pressure medicine. Doing this puts you at risk for problems and can make the medicine less effective.  Ask your health care provider about side effects or reactions to medicines that you  should watch for. Contact a health care provider if you:  Think you are having a reaction to a medicine you are taking.  Have headaches that keep coming back (recurring).  Feel dizzy.  Have swelling in your ankles.  Have trouble with your vision. Get help right away if you:  Develop a severe headache or confusion.  Have unusual weakness or numbness.  Feel faint.  Have severe pain in your chest or abdomen.  Vomit repeatedly.  Have trouble breathing. Summary  Hypertension is when the force of blood pumping through your arteries is too strong. If this condition is not controlled, it may put you at risk for serious complications.  Your personal target blood pressure may vary depending on your medical conditions, your age, and other factors. For most people, a normal blood pressure is less than 120/80.  Hypertension is treated with lifestyle changes, medicines, or a combination of both. Lifestyle changes include losing weight, eating a healthy, low-sodium diet, exercising more, and limiting alcohol. This information is not intended to replace advice given to you by your health care provider. Make sure you discuss any questions you have with your health care provider. Document Released: 08/26/2005 Document Revised: 05/06/2018 Document Reviewed: 05/06/2018 Elsevier Patient Education  [place].  Diabetes Mellitus and Nutrition, Adult When you have diabetes (diabetes mellitus), it is very important to have healthy eating habits because your blood sugar (glucose) levels are greatly affected by what you eat and drink. Eating healthy foods in the appropriate amounts, at about the same times every day, can help you:  Control your blood glucose.  Lower your risk of heart disease.  Improve your blood pressure.  Reach or maintain a healthy weight. Every person with diabetes is different, and each person has different needs for a meal plan. Your health care provider may  recommend that you work with a diet and nutrition specialist (dietitian) to make a meal plan that is best for you. Your meal plan may vary depending on factors such as:  The calories you need.  The medicines you take.  Your weight.  Your blood glucose, blood pressure, and cholesterol levels.  Your activity level.  Other health conditions you have, such as heart or kidney disease. How do carbohydrates affect me? Carbohydrates, also called carbs, affect your blood glucose level more than any other type of food. Eating carbs naturally raises the amount of glucose in your blood. Carb counting is a method for keeping track of how many carbs you eat. Counting carbs is important to keep your  blood glucose at a healthy level, especially if you use insulin or take certain oral diabetes medicines. It is important to know how many carbs you can safely have in each meal. This is different for every person. Your dietitian can help you calculate how many carbs you should have at each meal and for each snack. Foods that contain carbs include:  Bread, cereal, rice, pasta, and crackers.  Potatoes and corn.  Peas, beans, and lentils.  Milk and yogurt.  Fruit and juice.  Desserts, such as cakes, cookies, ice cream, and candy. How does alcohol affect me? Alcohol can cause a sudden decrease in blood glucose (hypoglycemia), especially if you use insulin or take certain oral diabetes medicines. Hypoglycemia can be a life-threatening condition. Symptoms of hypoglycemia (sleepiness, dizziness, and confusion) are similar to symptoms of having too much alcohol. If your health care provider says that alcohol is safe for you, follow these guidelines:  Limit alcohol intake to no more than 1 drink per day for nonpregnant women and 2 drinks per day for men. One drink equals 12 oz of beer, 5 oz of wine, or 1 oz of hard liquor.  Do not drink on an empty stomach.  Keep yourself hydrated with water, diet soda, or  unsweetened iced tea.  Keep in mind that regular soda, juice, and other mixers may contain a lot of sugar and must be counted as carbs. What are tips for following this plan?  Reading food labels  Start by checking the serving size on the "Nutrition Facts" label of packaged foods and drinks. The amount of calories, carbs, fats, and other nutrients listed on the label is based on one serving of the item. Many items contain more than one serving per package.  Check the total grams (g) of carbs in one serving. You can calculate the number of servings of carbs in one serving by dividing the total carbs by 15. For example, if a food has 30 g of total carbs, it would be equal to 2 servings of carbs.  Check the number of grams (g) of saturated and trans fats in one serving. Choose foods that have low or no amount of these fats.  Check the number of milligrams (mg) of salt (sodium) in one serving. Most people should limit total sodium intake to less than 2,300 mg per day.  Always check the nutrition information of foods labeled as "low-fat" or "nonfat". These foods may be higher in added sugar or refined carbs and should be avoided.  Talk to your dietitian to identify your daily goals for nutrients listed on the label. Shopping  Avoid buying canned, premade, or processed foods. These foods tend to be high in fat, sodium, and added sugar.  Shop around the outside edge of the grocery store. This includes fresh fruits and vegetables, bulk grains, fresh meats, and fresh dairy. Cooking  Use low-heat cooking methods, such as baking, instead of high-heat cooking methods like deep frying.  Cook using healthy oils, such as olive, canola, or sunflower oil.  Avoid cooking with butter, cream, or high-fat meats. Meal planning  Eat meals and snacks regularly, preferably at the same times every day. Avoid going long periods of time without eating.  Eat foods high in fiber, such as fresh fruits,  vegetables, beans, and whole grains. Talk to your dietitian about how many servings of carbs you can eat at each meal.  Eat 4-6 ounces (oz) of lean protein each day, such as lean meat, chicken, fish,  eggs, or tofu. One oz of lean protein is equal to: ? 1 oz of meat, chicken, or fish. ? 1 egg. ?  cup of tofu.  Eat some foods each day that contain healthy fats, such as avocado, nuts, seeds, and fish. Lifestyle  Check your blood glucose regularly.  Exercise regularly as told by your health care provider. This may include: ? 150 minutes of moderate-intensity or vigorous-intensity exercise each week. This could be brisk walking, biking, or water aerobics. ? Stretching and doing strength exercises, such as yoga or weightlifting, at least 2 times a week.  Take medicines as told by your health care provider.  Do not use any products that contain nicotine or tobacco, such as cigarettes and e-cigarettes. If you need help quitting, ask your health care provider.  Work with a Social worker or diabetes educator to identify strategies to manage stress and any emotional and social challenges. Questions to ask a health care provider  Do I need to meet with a diabetes educator?  Do I need to meet with a dietitian?  What number can I call if I have questions?  When are the best times to check my blood glucose? Where to find more information:  American Diabetes Association: diabetes.org  Academy of Nutrition and Dietetics: www.eatright.[employer] of Diabetes and Digestive and Kidney Diseases (NIH): [employer] Summary  A healthy meal plan will help you control your blood glucose and maintain a healthy lifestyle.  Working with a diet and nutrition specialist (dietitian) can help you make a meal plan that is best for you.  Keep in mind that carbohydrates (carbs) and alcohol have immediate effects on your blood glucose levels. It is important to count carbs and to use alcohol  carefully. This information is not intended to replace advice given to you by your health care provider. Make sure you discuss any questions you have with your health care provider. Document Released: 05/23/2005 Document Revised: 08/08/2017 Document Reviewed: 09/30/2016 Elsevier Patient Education  2020 [employer].

## 2019-08-11 NOTE — Progress Notes (Signed)
Lab Results  Component Value Date   HGBA1C 8.5 (A) 04/28/2019   BP Readings from Last 3 Encounters:  04/28/19 130/78  03/02/19 (!) 140/94  11/17/18 126/66   Lab Results  Component Value Date   CHOL 191 04/28/2019   HDL 58 04/28/2019   LDLCALC 103 (H) 04/28/2019   TRIG 150 (H) 04/28/2019   CHOLHDL 3.3 04/28/2019   Wt Readings from Last 3 Encounters:  04/28/19 253 lb 6.4 oz (114.9 kg)  03/02/19 252 lb 8 oz (114.5 kg)  10/26/18 242 lb (109.8 kg)   Lab Results  Component Value Date   CREATININE 0.99 04/28/2019   BUN 16 04/28/2019   NA 142 04/28/2019   K 3.9 04/28/2019   CL 101 04/28/2019   CO2 26 04/28/2019   Lindsay Travis 58 y.o.   Chief Complaint  Patient presents with   Chest Pain    x 1 week    Leg Pain    x 1 month-both when standing    HISTORY OF PRESENT ILLNESS: This is a 58 y.o. female with history of diabetes and hypertension here for follow-up. 1.  Diabetes: On glipizide 5 mg twice a day.  Did not start [place] as prescribed last time.  Home glucose 125-165.  Intolerant to metformin. 2.  Hypertension: On amlodipine 5 mg daily and hydrochlorothiazide 25 mg daily.  Blood pressure readings at home 130-160/70-80.  Takes 1 baby aspirin a day. 3.  History of dyslipidemia: Intolerant to statins. Had episodes of chest pain 2 to 3 weeks ago.  Sharp midsternal pain with mild exertion that lasted about 15 minutes.  No radiation and no associated symptoms. Also complaining of bilateral hip pain for 1 month worse when standing.  No radiation and no associated symptoms.  Denies trauma.   HPI   Prior to Admission medications   Medication Sig Start Date End Date Taking? Authorizing Provider  amLODipine (NORVASC) 5 MG tablet TAKE 1 TABLET(5 MG) BY MOUTH DAILY 07/14/19  Yes Rhia Blatchford, Ines Bloomer, MD  aspirin 81 MG EC tablet Take 1 tablet (81 mg total) by mouth daily. 03/20/18  Yes McCue, [name], NP  glipiZIDE (GLUCOTROL) 5 MG tablet TAKE 1 TABLET(5 MG) BY MOUTH TWICE  DAILY BEFORE A MEAL 07/15/19  Yes Melvina Pangelinan, Ines Bloomer, MD  hydrochlorothiazide (HYDRODIURIL) 25 MG tablet TAKE 1 TABLET(25 MG) BY MOUTH DAILY 11/18/18  Yes [place], Ines Bloomer, MD  Accu-Chek FastClix Lancets MISC USE TO TEST FASTING BLOOD GLUCOSE ONCE DAILY 12/22/18   Horald Pollen, MD  ACCU-CHEK GUIDE test strip USE TO CHECK FASTING BLOOD GLUCOSE ONCE DAILY 04/13/18   Jaynee Eagles, PA-C  blood glucose meter kit and supplies KIT Please check fasting blood sugar once daily. 10/26/18   Horald Pollen, MD  gabapentin (NEURONTIN) 300 MG capsule Take 300 mg by mouth 3 (three) times daily. 1 cap 1st week, 2 caps 2 wks, 3 caps 3 wks, continue. Different Provider.    [provider]    Allergies  Allergen Reactions   Lisinopril Other (See Comments)    Did not feel good   Losartan Other (See Comments)    headache   Aspirin Other (See Comments)    Abdominal pain    Patient Active Problem List   Diagnosis Date Noted   Cerebral thrombosis with cerebral infarction 01/10/2018   Hyperlipidemia    Type 2 diabetes mellitus with hyperglycemia, without long-term current use of insulin ([place]) 08/07/2015   Varicose veins of lower extremities with other complications 20/25/4270  Fibroid, uterus 04/18/2011   Hypertension 04/18/2011    Past Medical History:  Diagnosis Date   Allergy    Anemia    Clotting disorder ([place])    Diabetes mellitus    recent high blood sugar and has RX but not taking  med -b/c she doesn't think she  is diabetic, just had lots of sugar in her diet when  dx'd.   Hypertension    Shortness of breath    on exertion- has low hgb    Past Surgical History:  Procedure Laterality Date   ABDOMINAL HYSTERECTOMY  09/11/2011   Procedure: HYSTERECTOMY ABDOMINAL;  Surgeon: Frederico Hamman, MD;  Location: [facility];  Service: Gynecology;  Laterality: N/A;    Social History   Socioeconomic History   Marital status: Divorced    Spouse name: Not on  file   Number of children: Not on file   Years of education: Not on file   Highest education level: Not on file  Occupational History   Not on file  Social Needs   Financial resource strain: Not on file   Food insecurity    Worry: Not on file    Inability: Not on file   Transportation needs    Medical: Not on file    Non-medical: Not on file  Tobacco Use   Smoking status: Never Smoker   Smokeless tobacco: Never Used  Substance and Sexual Activity   Alcohol use: No   Drug use: No   Sexual activity: Never  Lifestyle   Physical activity    Days per week: Not on file    Minutes per session: Not on file   Stress: Not on file  Relationships   Social connections    Talks on phone: Not on file    Gets together: Not on file    Attends religious service: Not on file    Active member of club or organization: Not on file    Attends meetings of clubs or organizations: Not on file    Relationship status: Not on file   Intimate partner violence    Fear of current or ex partner: Not on file    Emotionally abused: Not on file    Physically abused: Not on file    Forced sexual activity: Not on file  Other Topics Concern   Not on file  Social History Narrative   Not on file    Family History  Problem Relation Age of Onset   Hypertension Other      Review of Systems  Constitutional: Negative.  Negative for chills and fever.  HENT: Negative.  Negative for congestion and sore throat.   Respiratory: Negative.  Negative for cough and shortness of breath.   Cardiovascular: Positive for chest pain. Negative for palpitations and leg swelling.  Gastrointestinal: Negative.  Negative for diarrhea, nausea and vomiting.  Genitourinary: Negative.  Negative for dysuria and hematuria.  Musculoskeletal: Negative.  Negative for back pain, myalgias and neck pain.  Skin: Negative.  Negative for rash.  Neurological: Negative for dizziness and headaches.  Endo/Heme/Allergies:  Negative.   All other systems reviewed and are negative.  Vitals:   08/11/19 1515  BP: (!) 143/84  Pulse: 68  Resp: 16  Temp: 97.7 F (36.5 C)  SpO2: 98%     Physical Exam Vitals signs reviewed.  Constitutional:      Appearance: She is well-developed.  HENT:     Head: Normocephalic.  Eyes:     Extraocular Movements: Extraocular  movements intact.     Conjunctiva/sclera: Conjunctivae normal.     Pupils: Pupils are equal, round, and reactive to light.  Neck:     Musculoskeletal: Normal range of motion and neck supple.  Cardiovascular:     Rate and Rhythm: Normal rate and regular rhythm.     Pulses: Normal pulses.     Heart sounds: Normal heart sounds.  Pulmonary:     Effort: Pulmonary effort is normal.     Breath sounds: Normal breath sounds.  Abdominal:     General: There is no distension.     Palpations: Abdomen is soft.     Tenderness: There is no abdominal tenderness.  Musculoskeletal: Normal range of motion.  Skin:    General: Skin is warm and dry.     Capillary Refill: Capillary refill takes less than 2 seconds.  Neurological:     General: No focal deficit present.     Mental Status: She is alert and oriented to person, place, and time.  Psychiatric:        Mood and Affect: Mood normal.        Behavior: Behavior normal.     Results for orders placed or performed in visit on 08/11/19 (from the past 24 hour(s))  POCT glucose (manual entry)     Status: None   Collection Time: 08/11/19  4:00 PM  Result Value Ref Range   POC Glucose 98 70 - 99 mg/dl  POCT glycosylated hemoglobin (Hb A1C)     Status: Abnormal   Collection Time: 08/11/19  4:19 PM  Result Value Ref Range   Hemoglobin A1C 7.8 (A) 4.0 - 5.6 %   HbA1c POC (<> result, manual entry)     HbA1c, POC (prediabetic range)     HbA1c, POC (controlled diabetic range)     EKG: Normal sinus rhythm with ventricular rate of 58/min.  No acute ischemic changes.  Normal EKG. ASSESSMENT & PLAN: Hypertension  associated with diabetes ([place]) Blood pressure slightly elevated.  Continue present medication.  No changes. Normal EKG.  Will refer to cardiology for nonspecific chest pain. Diabetes still uncontrolled with hemoglobin A1c at 7.8 although better than before.  Continue present medication and start Farxiga 5 mg daily.  Follow-up in 3 months.   Kylie was seen today for chest pain and leg pain.  Diagnoses and all orders for this visit:  Hypertension associated with diabetes ([place]) -     POCT glucose (manual entry) -     POCT glycosylated hemoglobin (Hb A1C) -     dapagliflozin propanediol (FARXIGA) 5 MG TABS tablet; Take 5 mg by mouth daily before breakfast. -     Ambulatory referral to Cardiology  Chest pain, unspecified type -     EKG 12-Lead -     Ambulatory referral to Cardiology  Need for prophylactic vaccination and inoculation against influenza -     Flu Vaccine QUAD 36+ mos IM    Patient Instructions       If you have lab work done today you will be contacted with your lab results within the next 2 weeks.  If you have not heard from [place] then please contact us. The fastest way to get your results is to register for My Chart.   IF you received an x-ray today, you will receive an invoice from [facility]. Please contact [facility] at [phone] with questions or concerns regarding your invoice.   IF you received labwork today, you will receive an invoice from  [facility]. Please contact [facility] at [phone] with questions or concerns regarding your invoice.   Our billing staff will not be able to assist you with questions regarding bills from these companies.  You will be contacted with the lab results as soon as they are available. The fastest way to get your results is to activate your My Chart account. Instructions are located on the last page of this paperwork. If you have not heard from [place] regarding the results in 2 weeks, please contact this  office.     Hypertension, Adult High blood pressure (hypertension) is when the force of blood pumping through the arteries is too strong. The arteries are the blood vessels that carry blood from the heart throughout the body. Hypertension forces the heart to work harder to pump blood and may cause arteries to become narrow or stiff. Untreated or uncontrolled hypertension can cause a heart attack, heart failure, a stroke, kidney disease, and other problems. A blood pressure reading consists of a higher number over a lower number. Ideally, your blood pressure should be below 120/80. The first ("top") number is called the systolic pressure. It is a measure of the pressure in your arteries as your heart beats. The second ("bottom") number is called the diastolic pressure. It is a measure of the pressure in your arteries as the heart relaxes. What are the causes? The exact cause of this condition is not known. There are some conditions that result in or are related to high blood pressure. What increases the risk? Some risk factors for high blood pressure are under your control. The following factors may make you more likely to develop this condition:  Smoking.  Having type 2 diabetes mellitus, high cholesterol, or both.  Not getting enough exercise or physical activity.  Being overweight.  Having too much fat, sugar, calories, or salt (sodium) in your diet.  Drinking too much alcohol. Some risk factors for high blood pressure may be difficult or impossible to change. Some of these factors include:  Having chronic kidney disease.  Having a family history of high blood pressure.  Age. Risk increases with age.  Race. You may be at higher risk if you are African American.  Gender. Men are at higher risk than women before age 26. After age 65, women are at higher risk than men.  Having obstructive sleep apnea.  Stress. What are the signs or symptoms? High blood pressure may not cause  symptoms. Very high blood pressure (hypertensive crisis) may cause:  Headache.  Anxiety.  Shortness of breath.  Nosebleed.  Nausea and vomiting.  Vision changes.  Severe chest pain.  Seizures. How is this diagnosed? This condition is diagnosed by measuring your blood pressure while you are seated, with your arm resting on a flat surface, your legs uncrossed, and your feet flat on the floor. The cuff of the blood pressure monitor will be placed directly against the skin of your upper arm at the level of your heart. It should be measured at least twice using the same arm. Certain conditions can cause a difference in blood pressure between your right and left arms. Certain factors can cause blood pressure readings to be lower or higher than normal for a short period of time:  When your blood pressure is higher when you are in a health care provider's office than when you are at home, this is called white coat hypertension. Most people with this condition do not need medicines.  When your blood pressure is  higher at home than when you are in a health care provider's office, this is called masked hypertension. Most people with this condition may need medicines to control blood pressure. If you have a high blood pressure reading during one visit or you have normal blood pressure with other risk factors, you may be asked to:  Return on a different day to have your blood pressure checked again.  Monitor your blood pressure at home for 1 week or longer. If you are diagnosed with hypertension, you may have other blood or imaging tests to help your health care provider understand your overall risk for other conditions. How is this treated? This condition is treated by making healthy lifestyle changes, such as eating healthy foods, exercising more, and reducing your alcohol intake. Your health care provider may prescribe medicine if lifestyle changes are not enough to get your blood pressure under  control, and if:  Your systolic blood pressure is above 130.  Your diastolic blood pressure is above 80. Your personal target blood pressure may vary depending on your medical conditions, your age, and other factors. Follow these instructions at home: Eating and drinking   Eat a diet that is high in fiber and potassium, and low in sodium, added sugar, and fat. An example eating plan is called the DASH (Dietary Approaches to Stop Hypertension) diet. To eat this way: ? Eat plenty of fresh fruits and vegetables. Try to fill one half of your plate at each meal with fruits and vegetables. ? Eat whole grains, such as whole-wheat pasta, brown rice, or whole-grain bread. Fill about one fourth of your plate with whole grains. ? Eat or drink low-fat dairy products, such as skim milk or low-fat yogurt. ? Avoid fatty cuts of meat, processed or cured meats, and poultry with skin. Fill about one fourth of your plate with lean proteins, such as fish, chicken without skin, beans, eggs, or tofu. ? Avoid pre-made and processed foods. These tend to be higher in sodium, added sugar, and fat.  Reduce your daily sodium intake. Most people with hypertension should eat less than 1,500 mg of sodium a day.  Do not drink alcohol if: ? Your health care provider tells you not to drink. ? You are pregnant, may be pregnant, or are planning to become pregnant.  If you drink alcohol: ? Limit how much you use to:  0-1 drink a day for women.  0-2 drinks a day for men. ? Be aware of how much alcohol is in your drink. In the [place] one drink equals one 12 oz bottle of beer (355 mL), one 5 oz glass of wine (148 mL), or one 1 oz glass of hard liquor (44 mL). Lifestyle   Work with your health care provider to maintain a healthy body weight or to lose weight. Ask what an ideal weight is for you.  Get at least 30 minutes of exercise most days of the week. Activities may include walking, swimming, or biking.  Include  exercise to strengthen your muscles (resistance exercise), such as Pilates or lifting weights, as part of your weekly exercise routine. Try to do these types of exercises for 30 minutes at least 3 days a week.  Do not use any products that contain nicotine or tobacco, such as cigarettes, e-cigarettes, and chewing tobacco. If you need help quitting, ask your health care provider.  Monitor your blood pressure at home as told by your health care provider.  Keep all follow-up visits as told by  your health care provider. This is important. Medicines  Take over-the-counter and prescription medicines only as told by your health care provider. Follow directions carefully. Blood pressure medicines must be taken as prescribed.  Do not skip doses of blood pressure medicine. Doing this puts you at risk for problems and can make the medicine less effective.  Ask your health care provider about side effects or reactions to medicines that you should watch for. Contact a health care provider if you:  Think you are having a reaction to a medicine you are taking.  Have headaches that keep coming back (recurring).  Feel dizzy.  Have swelling in your ankles.  Have trouble with your vision. Get help right away if you:  Develop a severe headache or confusion.  Have unusual weakness or numbness.  Feel faint.  Have severe pain in your chest or abdomen.  Vomit repeatedly.  Have trouble breathing. Summary  Hypertension is when the force of blood pumping through your arteries is too strong. If this condition is not controlled, it may put you at risk for serious complications.  Your personal target blood pressure may vary depending on your medical conditions, your age, and other factors. For most people, a normal blood pressure is less than 120/80.  Hypertension is treated with lifestyle changes, medicines, or a combination of both. Lifestyle changes include losing weight, eating a healthy,  low-sodium diet, exercising more, and limiting alcohol. This information is not intended to replace advice given to you by your health care provider. Make sure you discuss any questions you have with your health care provider. Document Released: 08/26/2005 Document Revised: 05/06/2018 Document Reviewed: 05/06/2018 Elsevier Patient Education  [place].  Diabetes Mellitus and Nutrition, Adult When you have diabetes (diabetes mellitus), it is very important to have healthy eating habits because your blood sugar (glucose) levels are greatly affected by what you eat and drink. Eating healthy foods in the appropriate amounts, at about the same times every day, can help you:  Control your blood glucose.  Lower your risk of heart disease.  Improve your blood pressure.  Reach or maintain a healthy weight. Every person with diabetes is different, and each person has different needs for a meal plan. Your health care provider may recommend that you work with a diet and nutrition specialist (dietitian) to make a meal plan that is best for you. Your meal plan may vary depending on factors such as:  The calories you need.  The medicines you take.  Your weight.  Your blood glucose, blood pressure, and cholesterol levels.  Your activity level.  Other health conditions you have, such as heart or kidney disease. How do carbohydrates affect me? Carbohydrates, also called carbs, affect your blood glucose level more than any other type of food. Eating carbs naturally raises the amount of glucose in your blood. Carb counting is a method for keeping track of how many carbs you eat. Counting carbs is important to keep your blood glucose at a healthy level, especially if you use insulin or take certain oral diabetes medicines. It is important to know how many carbs you can safely have in each meal. This is different for every person. Your dietitian can help you calculate how many carbs you should have  at each meal and for each snack. Foods that contain carbs include:  Bread, cereal, rice, pasta, and crackers.  Potatoes and corn.  Peas, beans, and lentils.  Milk and yogurt.  Fruit and juice.  Desserts, such  as cakes, cookies, ice cream, and candy. How does alcohol affect me? Alcohol can cause a sudden decrease in blood glucose (hypoglycemia), especially if you use insulin or take certain oral diabetes medicines. Hypoglycemia can be a life-threatening condition. Symptoms of hypoglycemia (sleepiness, dizziness, and confusion) are similar to symptoms of having too much alcohol. If your health care provider says that alcohol is safe for you, follow these guidelines:  Limit alcohol intake to no more than 1 drink per day for nonpregnant women and 2 drinks per day for men. One drink equals 12 oz of beer, 5 oz of wine, or 1 oz of hard liquor.  Do not drink on an empty stomach.  Keep yourself hydrated with water, diet soda, or unsweetened iced tea.  Keep in mind that regular soda, juice, and other mixers may contain a lot of sugar and must be counted as carbs. What are tips for following this plan?  Reading food labels  Start by checking the serving size on the "Nutrition Facts" label of packaged foods and drinks. The amount of calories, carbs, fats, and other nutrients listed on the label is based on one serving of the item. Many items contain more than one serving per package.  Check the total grams (g) of carbs in one serving. You can calculate the number of servings of carbs in one serving by dividing the total carbs by 15. For example, if a food has 30 g of total carbs, it would be equal to 2 servings of carbs.  Check the number of grams (g) of saturated and trans fats in one serving. Choose foods that have low or no amount of these fats.  Check the number of milligrams (mg) of salt (sodium) in one serving. Most people should limit total sodium intake to less than 2,300 mg per  day.  Always check the nutrition information of foods labeled as "low-fat" or "nonfat". These foods may be higher in added sugar or refined carbs and should be avoided.  Talk to your dietitian to identify your daily goals for nutrients listed on the label. Shopping  Avoid buying canned, premade, or processed foods. These foods tend to be high in fat, sodium, and added sugar.  Shop around the outside edge of the grocery store. This includes fresh fruits and vegetables, bulk grains, fresh meats, and fresh dairy. Cooking  Use low-heat cooking methods, such as baking, instead of high-heat cooking methods like deep frying.  Cook using healthy oils, such as olive, canola, or sunflower oil.  Avoid cooking with butter, cream, or high-fat meats. Meal planning  Eat meals and snacks regularly, preferably at the same times every day. Avoid going long periods of time without eating.  Eat foods high in fiber, such as fresh fruits, vegetables, beans, and whole grains. Talk to your dietitian about how many servings of carbs you can eat at each meal.  Eat 4-6 ounces (oz) of lean protein each day, such as lean meat, chicken, fish, eggs, or tofu. One oz of lean protein is equal to: ? 1 oz of meat, chicken, or fish. ? 1 egg. ?  cup of tofu.  Eat some foods each day that contain healthy fats, such as avocado, nuts, seeds, and fish. Lifestyle  Check your blood glucose regularly.  Exercise regularly as told by your health care provider. This may include: ? 150 minutes of moderate-intensity or vigorous-intensity exercise each week. This could be brisk walking, biking, or water aerobics. ? Stretching and doing strength exercises, such  as yoga or weightlifting, at least 2 times a week.  Take medicines as told by your health care provider.  Do not use any products that contain nicotine or tobacco, such as cigarettes and e-cigarettes. If you need help quitting, ask your health care provider.  Work with  a Social worker or diabetes educator to identify strategies to manage stress and any emotional and social challenges. Questions to ask a health care provider  Do I need to meet with a diabetes educator?  Do I need to meet with a dietitian?  What number can I call if I have questions?  When are the best times to check my blood glucose? Where to find more information:  American Diabetes Association: diabetes.org  Academy of Nutrition and Dietetics: www.eatright.[employer] of Diabetes and Digestive and Kidney Diseases (NIH): [employer] Summary  A healthy meal plan will help you control your blood glucose and maintain a healthy lifestyle.  Working with a diet and nutrition specialist (dietitian) can help you make a meal plan that is best for you.  Keep in mind that carbohydrates (carbs) and alcohol have immediate effects on your blood glucose levels. It is important to count carbs and to use alcohol carefully. This information is not intended to replace advice given to you by your health care provider. Make sure you discuss any questions you have with your health care provider. Document Released: 05/23/2005 Document Revised: 08/08/2017 Document Reviewed: 09/30/2016 Elsevier Patient Education  2020 Elsevier Inc.      Agustina Caroli, MD [facility]

## 2019-08-11 NOTE — Assessment & Plan Note (Signed)
Blood pressure slightly elevated.  Continue present medication.  No changes. Normal EKG.  Will refer to cardiology for nonspecific chest pain. Diabetes still uncontrolled with hemoglobin A1c at 7.8 although better than before.  Continue present medication and start Farxiga 5 mg daily.  Follow-up in 3 months.

## 2019-08-18 ENCOUNTER — Telehealth: Payer: Self-pay

## 2019-08-18 NOTE — Telephone Encounter (Signed)
PA for [place] on cover my meds penidng.  Your information has been submitted to [place]. [employer] [place] will review the request and fax you a determination directly, typically within 3 business days of your submission once all necessary information is received. If [employer] [place] has not responded in 3 business days or if you have any questions about your submission, contact [place] at [phone].

## 2019-08-19 NOTE — Telephone Encounter (Signed)
Repatha approve from 08/18/2019 to 12/8/2021with [employer].Reference number [id number].

## 2019-09-15 ENCOUNTER — Telehealth: Payer: Self-pay

## 2019-09-15 ENCOUNTER — Ambulatory Visit: Payer: BLUE CROSS/BLUE SHIELD | Admitting: Adult Health

## 2019-09-15 NOTE — Telephone Encounter (Signed)
Patient was a no call/no show for their appointment today.    From: Charmian Muff  Sent: 09/15/2019  9:41 AM EST  To: Brandon Melnick, RN  Subject: RE: cpap report                  I see the same thing in [place]. She has called in for CPAP supplies, but she has a balance in collections that needs to be taken care of before we can provide her with anything. We've explained that to her, but she has not taken care of said balance. She hasn't return our calls regarding a machine check either.
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
## 2019-09-16 ENCOUNTER — Other Ambulatory Visit: Payer: Self-pay

## 2019-09-16 ENCOUNTER — Encounter: Payer: Self-pay | Admitting: Emergency Medicine

## 2019-09-16 ENCOUNTER — Telehealth (INDEPENDENT_AMBULATORY_CARE_PROVIDER_SITE_OTHER): Payer: Self-pay | Admitting: Emergency Medicine

## 2019-09-16 VITALS — Ht 66.0 in | Wt 240.0 lb

## 2019-09-16 DIAGNOSIS — R031 Nonspecific low blood-pressure reading: Secondary | ICD-10-CM

## 2019-09-16 DIAGNOSIS — M5442 Lumbago with sciatica, left side: Secondary | ICD-10-CM

## 2019-09-16 DIAGNOSIS — I152 Hypertension secondary to endocrine disorders: Secondary | ICD-10-CM

## 2019-09-16 DIAGNOSIS — I10 Essential (primary) hypertension: Secondary | ICD-10-CM

## 2019-09-16 DIAGNOSIS — R531 Weakness: Secondary | ICD-10-CM

## 2019-09-16 DIAGNOSIS — M5441 Lumbago with sciatica, right side: Secondary | ICD-10-CM

## 2019-09-16 DIAGNOSIS — E1159 Type 2 diabetes mellitus with other circulatory complications: Secondary | ICD-10-CM

## 2019-09-16 NOTE — Progress Notes (Signed)
Telemedicine Encounter- SOAP NOTE Established Patient  This telephone encounter was conducted with the patient's (or proxy's) verbal consent via audio telecommunications: yes/no: Yes Patient was instructed to have this encounter in a suitably private space; and to only have persons present to whom they give permission to participate. In addition, patient identity was confirmed by use of name plus two identifiers (DOB and address).  I discussed the limitations, risks, security and privacy concerns of performing an evaluation and management service by telephone and the availability of in person appointments. I also discussed with the patient that there may be a patient responsible charge related to this service. The patient expressed understanding and agreed to proceed.  I spent a total of TIME; 0 MIN TO 60 MIN: 15 minutes talking with the patient or their proxy.  Chief Complaint  Patient presents with  . Back Pain    lower x 3 days  . Leg Pain    both and unable to move them x 3 days when walking and weakness  . blood pressure problems    per patient having problems with low readings - this morning 92/75,     Subjective   Lindsay Travis is a 59 y.o. female with chronic medical problems established patient. Telephone visit today complaining of constant low back pain radiating down to both legs since last Monday, 4 days ago, along with low blood pressure.  Denies abdominal pain or syncope.  Denies nausea or vomiting.  Denies chest pain or shortness of breath.  Denies fever or chills.  Denies flulike symptoms.  Denies urinary symptoms.  Denies diarrhea.  Denies any other significant symptoms. Patient has a history of diabetes, hypertension, and dyslipidemia.  On multiple medications.  HPI   Patient Active Problem List   Diagnosis Date Noted  . Cerebral thrombosis with cerebral infarction 01/10/2018  . Hyperlipidemia   . Type 2 diabetes mellitus with hyperglycemia, without long-term  current use of insulin ([place]) 08/07/2015  . Varicose veins of lower extremities with other complications 40/98/1191  . Fibroid, uterus 04/18/2011  . Hypertension associated with diabetes ([place]) 04/18/2011    Past Medical History:  Diagnosis Date  . Allergy   . Anemia   . Clotting disorder ([place])   . Diabetes mellitus    recent high blood sugar and has RX but not taking  med -b/c she doesn't think she  is diabetic, just had lots of sugar in her diet when  dx'd.  Marland Kitchen Hypertension   . Shortness of breath    on exertion- has low hgb    Current Outpatient Medications  Medication Sig Dispense Refill  . amLODipine (NORVASC) 5 MG tablet TAKE 1 TABLET(5 MG) BY MOUTH DAILY 90 tablet 1  . aspirin 81 MG EC tablet Take 1 tablet (81 mg total) by mouth daily. 90 tablet 3  . glipiZIDE (GLUCOTROL) 5 MG tablet TAKE 1 TABLET(5 MG) BY MOUTH TWICE DAILY BEFORE A MEAL 180 tablet 0  . hydrochlorothiazide (HYDRODIURIL) 25 MG tablet TAKE 1 TABLET(25 MG) BY MOUTH DAILY 90 tablet 3  . Accu-Chek FastClix Lancets MISC USE TO TEST FASTING BLOOD GLUCOSE ONCE DAILY 102 each 3  . ACCU-CHEK GUIDE test strip USE TO CHECK FASTING BLOOD GLUCOSE ONCE DAILY 100 each 1  . blood glucose meter kit and supplies KIT Please check fasting blood sugar once daily. 1 each 0  . dapagliflozin propanediol (FARXIGA) 5 MG TABS tablet Take 5 mg by mouth daily before breakfast. (Patient not taking: Reported on  09/16/2019) 90 tablet 3  . gabapentin (NEURONTIN) 300 MG capsule Take 300 mg by mouth 3 (three) times daily. 1 cap 1st week, 2 caps 2 wks, 3 caps 3 wks, continue. Different Provider.     No current facility-administered medications for this visit.    Allergies  Allergen Reactions  . Lisinopril Other (See Comments)    Did not feel good  . Losartan Other (See Comments)    headache  . Aspirin Other (See Comments)    Abdominal pain    Social History   Socioeconomic History  . Marital status: Divorced    Spouse name: Not on file    . Number of children: Not on file  . Years of education: Not on file  . Highest education level: Not on file  Occupational History  . Not on file  Tobacco Use  . Smoking status: Never Smoker  . Smokeless tobacco: Never Used  Substance and Sexual Activity  . Alcohol use: No  . Drug use: No  . Sexual activity: Never  Other Topics Concern  . Not on file  Social History Narrative  . Not on file   Social Determinants of Health   Financial Resource Strain:   . Difficulty of Paying Living Expenses: Not on file  Food Insecurity:   . Worried About Charity fundraiser in the Last Year: Not on file  . Ran Out of Food in the Last Year: Not on file  Transportation Needs:   . Lack of Transportation (Medical): Not on file  . Lack of Transportation (Non-Medical): Not on file  Physical Activity:   . Days of Exercise per Week: Not on file  . Minutes of Exercise per Session: Not on file  Stress:   . Feeling of Stress : Not on file  Social Connections:   . Frequency of Communication with Friends and Family: Not on file  . Frequency of Social Gatherings with Friends and Family: Not on file  . Attends Religious Services: Not on file  . Active Member of Clubs or Organizations: Not on file  . Attends Archivist Meetings: Not on file  . Marital Status: Not on file  Intimate Partner Violence:   . Fear of Current or Ex-Partner: Not on file  . Emotionally Abused: Not on file  . Physically Abused: Not on file  . Sexually Abused: Not on file    Review of Systems  Constitutional: Negative.  Negative for chills and fever.  HENT: Negative.  Negative for congestion and sore throat.   Eyes: Negative.   Respiratory: Negative.  Negative for cough and shortness of breath.   Cardiovascular: Negative.  Negative for chest pain and palpitations.  Gastrointestinal: Negative.  Negative for abdominal pain, blood in stool, diarrhea, melena, nausea and vomiting.  Genitourinary: Negative.  Negative  for dysuria and hematuria.  Musculoskeletal: Positive for back pain and joint pain.  Skin: Negative.  Negative for rash.  Neurological: Positive for weakness. Negative for dizziness, sensory change, speech change, focal weakness, loss of consciousness and headaches.  All other systems reviewed and are negative.   Objective  Alert and oriented x3 in no apparent respiratory distress Vitals as reported by the patient: Today's Vitals   09/16/19 1553  Weight: 240 lb (108.9 kg)  Height: 5' 6" (1.676 m)    There are no diagnoses linked to this encounter.  Lindsay Travis was seen today for back pain, leg pain and blood pressure problems.  Diagnoses and all orders for this visit:  General weakness  Low blood pressure reading  Acute bilateral low back pain with bilateral sciatica  Hypertension associated with diabetes ([place])     Advised to go to emergency department or urgent care center now for further evaluation. I discussed the assessment and treatment plan with the patient. The patient was provided an opportunity to ask questions and all were answered. The patient agreed with the plan and demonstrated an understanding of the instructions.   The patient was advised to call back or seek an in-person evaluation if the symptoms worsen or if the condition fails to improve as anticipated.  I provided 15 minutes of non-face-to-face time during this encounter.  Horald Pollen, MD  Primary Care at [facility]

## 2019-09-17 ENCOUNTER — Encounter (HOSPITAL_COMMUNITY): Payer: Self-pay

## 2019-09-17 ENCOUNTER — Ambulatory Visit (HOSPITAL_COMMUNITY)
Admission: EM | Admit: 2019-09-17 | Discharge: 2019-09-17 | Disposition: A | Discharge status: Home / Self Care | Payer: 59 | Attending: Family Medicine | Admitting: Family Medicine

## 2019-09-17 ENCOUNTER — Ambulatory Visit (INDEPENDENT_AMBULATORY_CARE_PROVIDER_SITE_OTHER): Payer: 59

## 2019-09-17 DIAGNOSIS — R5383 Other fatigue: Secondary | ICD-10-CM | POA: Diagnosis not present

## 2019-09-17 DIAGNOSIS — J1282 Pneumonia due to coronavirus disease 2019: Secondary | ICD-10-CM

## 2019-09-17 DIAGNOSIS — R531 Weakness: Secondary | ICD-10-CM

## 2019-09-17 DIAGNOSIS — R918 Other nonspecific abnormal finding of lung field: Secondary | ICD-10-CM

## 2019-09-17 DIAGNOSIS — U071 COVID-19: Secondary | ICD-10-CM

## 2019-09-17 DIAGNOSIS — M5489 Other dorsalgia: Secondary | ICD-10-CM

## 2019-09-17 LAB — POC SARS CORONAVIRUS 2 AG: SARS Coronavirus 2 Ag: POSITIVE — AB

## 2019-09-17 LAB — POC SARS CORONAVIRUS 2 AG -  ED
SARS Coronavirus 2 Ag: POSITIVE — AB
SARS Coronavirus 2 Ag: POSITIVE — AB

## 2019-09-17 MED ORDER — ACETAMINOPHEN 325 MG PO TABS
ORAL_TABLET | ORAL | Status: AC
  Filled 2019-09-17: qty 2

## 2019-09-17 MED ORDER — ACETAMINOPHEN 325 MG PO TABS
650.0000 mg | ORAL_TABLET | Freq: Once | ORAL | Status: AC
  Administered 2019-09-17: 14:00:00 650 mg via ORAL

## 2019-09-17 MED ORDER — BENZONATATE 200 MG PO CAPS: 200.0000 mg | ORAL_CAPSULE | Freq: Two times a day (BID) | ORAL | 0 refills | Status: DC | PRN

## 2019-09-17 NOTE — ED Provider Notes (Signed)
[facility]    CSN: [id number] Arrival date & time: 09/17/19  1337      History   Chief Complaint Chief Complaint  Patient presents with  . Fatigue    HPI Lindsay Travis is a 59 y.o. female.   HPI  Patient is here for illness.  She had a virtual visit with her PCP yesterday.  He told her to be seen for Covid testing and evaluation.  She decided to wait and come today.  She has fatigue, neck and back pain.  Shortness of breath with exertion.  Denies fever chills.  No change in appetite taste or smell.  No known exposure to coronavirus, in fact she has been trying diligently to avoid.   She is here with her daughter.  She lives with a different daughter and her daughter's children.  None of them are ill. She does have increased risk of coronavirus with obesity, diabetes mellitus, and history of stroke/clotting disorder.  Well-controlled hypertension and hyperlipidemia.  Denies heart disease   Past Medical History:  Diagnosis Date  . Allergy   . Anemia   . Clotting disorder ([place])   . Diabetes mellitus    recent high blood sugar and has RX but not taking  med -b/c she doesn't think she  is diabetic, just had lots of sugar in her diet when  dx'd.  Marland Kitchen Hypertension   . Shortness of breath    on exertion- has low hgb    Patient Active Problem List   Diagnosis Date Noted  . Cerebral thrombosis with cerebral infarction 01/10/2018  . Hyperlipidemia   . Type 2 diabetes mellitus with hyperglycemia, without long-term current use of insulin ([place]) 08/07/2015  . Varicose veins of lower extremities with other complications 59/74/1638  . Fibroid, uterus 04/18/2011  . Hypertension associated with diabetes ([place]) 04/18/2011    Past Surgical History:  Procedure Laterality Date  . ABDOMINAL HYSTERECTOMY  09/11/2011   Procedure: HYSTERECTOMY ABDOMINAL;  Surgeon: Frederico Hamman, MD;  Location: [facility];  Service: Gynecology;  Laterality: N/A;    OB History    Gravida  5   Para   5   Term  5   Preterm  0   AB  0   Living  5     SAB  0   TAB  0   Ectopic  0   Multiple  0   Live Births               Home Medications    Prior to Admission medications   Medication Sig Start Date End Date Taking? Authorizing Provider  Accu-Chek FastClix Lancets MISC USE TO TEST FASTING BLOOD GLUCOSE ONCE DAILY 12/22/18   Horald Pollen, MD  ACCU-CHEK GUIDE test strip USE TO CHECK FASTING BLOOD GLUCOSE ONCE DAILY 04/13/18   Jaynee Eagles, PA-C  amLODipine (NORVASC) 5 MG tablet TAKE 1 TABLET(5 MG) BY MOUTH DAILY 07/14/19   Horald Pollen, MD  aspirin 81 MG EC tablet Take 1 tablet (81 mg total) by mouth daily. 03/20/18   Frann Rider, NP  benzonatate (TESSALON) 200 MG capsule Take 1 capsule (200 mg total) by mouth 2 (two) times daily as needed for cough. 09/17/19   Raylene Everts, MD  blood glucose meter kit and supplies KIT Please check fasting blood sugar once daily. 10/26/18   Horald Pollen, MD  dapagliflozin propanediol (FARXIGA) 5 MG TABS tablet Take 5 mg by mouth daily before breakfast. Patient not taking:  Reported on 09/16/2019 08/11/19 11/09/19  Horald Pollen, MD  gabapentin (NEURONTIN) 300 MG capsule Take 300 mg by mouth 3 (three) times daily. 1 cap 1st week, 2 caps 2 wks, 3 caps 3 wks, continue. Different Provider.    [provider]  glipiZIDE (GLUCOTROL) 5 MG tablet TAKE 1 TABLET(5 MG) BY MOUTH TWICE DAILY BEFORE A MEAL 07/15/19   Horald Pollen, MD  hydrochlorothiazide (HYDRODIURIL) 25 MG tablet TAKE 1 TABLET(25 MG) BY MOUTH DAILY 11/18/18   Horald Pollen, MD    Family History Family History  Problem Relation Age of Onset  . Hypertension Other     Social History Social History   Tobacco Use  . Smoking status: Never Smoker  . Smokeless tobacco: Never Used  Substance Use Topics  . Alcohol use: No  . Drug use: No     Allergies   Lisinopril, Losartan, and Aspirin   Review of Systems Review of  Systems  Constitutional: Positive for fatigue. Negative for chills and fever.  HENT: Negative for congestion, rhinorrhea and sore throat.   Eyes: Negative for visual disturbance.  Respiratory: Positive for shortness of breath. Negative for cough.        With exertion  Cardiovascular: Negative for chest pain, palpitations and leg swelling.  Gastrointestinal: Negative for abdominal pain, nausea and vomiting.  Musculoskeletal: Positive for back pain, myalgias and neck pain.  Neurological: Positive for weakness. Negative for headaches.       Generally feels weak     Physical Exam Triage Vital Signs ED Triage Vitals  Enc Vitals Group     BP 09/17/19 1410 139/67     Pulse Rate 09/17/19 1410 94     Resp 09/17/19 1410 18     Temp 09/17/19 1410 (!) 102.9 F (39.4 C)     Temp Source 09/17/19 1410 Oral     SpO2 09/17/19 1410 95 %     Weight --      Height --      Head Circumference --      Peak Flow --      Pain Score 09/17/19 1412 10     Pain Loc --      Pain Edu? --      Excl. in [place]? --    No data found.  Updated Vital Signs BP 139/67 (BP Location: Left Arm)   Pulse 94   Temp (!) 102.9 F (39.4 C) (Oral)   Resp 18   LMP 09/10/2011   SpO2 95%     Physical Exam Constitutional:      General: She is in acute distress.     Appearance: She is well-developed. She is obese. She is ill-appearing.     Comments: Appears very tired.  Slumped over.  Weak.  He is in wheelchair  HENT:     Head: Normocephalic and atraumatic.     Mouth/Throat:     Mouth: Mucous membranes are moist.     Comments: Mask in place.  Briefly looked at mouth, moist Eyes:     Conjunctiva/sclera: Conjunctivae normal.     Pupils: Pupils are equal, round, and reactive to light.  Cardiovascular:     Rate and Rhythm: Normal rate and regular rhythm.     Heart sounds: Normal heart sounds.  Pulmonary:     Effort: Pulmonary effort is normal. No respiratory distress.     Breath sounds: Rales present.      Comments: Rales in both bases.  Tachypnea respiratory rate 30  Abdominal:     General: There is no distension.     Palpations: Abdomen is soft.  Musculoskeletal:        General: Normal range of motion.     Cervical back: Normal range of motion and neck supple.     Right lower leg: No edema.     Left lower leg: No edema.  Skin:    General: Skin is warm.     Comments: damp  Neurological:     Mental Status: She is alert. Mental status is at baseline.  Psychiatric:        Behavior: Behavior normal.      UC Treatments / Results  Labs (all labs ordered are listed, but only abnormal results are displayed) Labs Reviewed  POC SARS CORONAVIRUS 2 AG -  ED - Abnormal; Notable for the following components:      Result Value   SARS Coronavirus 2 Ag POSITIVE (*)    All other components within normal limits  POC SARS CORONAVIRUS 2 AG - Abnormal; Notable for the following components:   SARS Coronavirus 2 Ag POSITIVE (*)    All other components within normal limits  POC SARS CORONAVIRUS 2 AG -  ED - Abnormal; Notable for the following components:   SARS Coronavirus 2 Ag POSITIVE (*)    All other components within normal limits    EKG   Radiology DG Chest 2 View  Result Date: 09/17/2019 CLINICAL DATA:  Fatigue and back pain. EXAM: CHEST - 2 VIEW COMPARISON:  Jan 19, 2004 FINDINGS: Mild infiltrates are seen within the bilateral lung bases. There is no evidence of a pleural effusion or pneumothorax. The heart size and mediastinal contours are within normal limits. Multilevel degenerative changes seen throughout the thoracic spine. IMPRESSION: 1. Mild bibasilar infiltrates. Electronically Signed   By: Virgina Norfolk M.D.   On: 09/17/2019 15:18    Procedures Procedures (including critical care time)  Medications Ordered in UC Medications  acetaminophen (TYLENOL) tablet 650 mg (650 mg Oral Given 09/17/19 1422)    Initial Impression / Assessment and Plan / UC Course  I have reviewed the  triage vital signs and the nursing notes.  Pertinent labs & imaging results that were available during my care of the patient were reviewed by me and considered in my medical decision making (see chart for details).  I reviewed with the daughter and Emalee her symptoms.  Covid positive.  Chest x-ray with bilateral pneumonia.  Lack of treatment options for Covid, I did offer symptomatic care consented Tessalon.  I told family that she is on the border for being admitted to the hospital just because of her weakness and risk factors.  Her oxygenation is good, patient prefers to go home.  I told her I would have a very low threshold for bringing her back into the emergency room if she is worse at all. They ask for medicine for "pneumonia".  They are reminded that this is a virus, and no current medication is available for treatment  Final Clinical Impressions(s) / UC Diagnoses   Final diagnoses:  Pneumonia due to COVID-19 virus  Generalized weakness     Discharge Instructions     You need to go home and rest Drink plenty of fluids Take Tylenol for pain and fever Take Tessalon 2-3 times a day for the cough If you become worse instead of better, more short of breath, more weak, start to feel dizzy you must go to the emergency department.  Do not hesitate to call 911 if you feel difficulty breathing. Call if you have any questions    ED Prescriptions    Medication Sig Dispense Auth. Provider   benzonatate (TESSALON) 200 MG capsule Take 1 capsule (200 mg total) by mouth 2 (two) times daily as needed for cough. 20 capsule Raylene Everts, MD     PDMP not reviewed this encounter.   Raylene Everts, MD 09/17/19 [place]

## 2019-09-17 NOTE — Discharge Instructions (Signed)
You need to go home and rest Drink plenty of fluids Take Tylenol for pain and fever Take Tessalon 2-3 times a day for the cough If you become worse instead of better, more short of breath, more weak, start to feel dizzy you must go to the emergency department.  Do not hesitate to call 911 if you feel difficulty breathing. Call if you have any questions

## 2019-09-17 NOTE — ED Triage Notes (Signed)
Pt present fatigue, symptoms started 3 days ago. She also C/o neck and back pain.

## 2019-09-18 ENCOUNTER — Telehealth: Payer: Self-pay | Admitting: Infectious Diseases

## 2019-09-18 ENCOUNTER — Encounter: Payer: Self-pay | Admitting: Nurse Practitioner

## 2019-09-18 NOTE — Telephone Encounter (Signed)
Called to discuss with patient about Covid symptoms and the use of bamlanivimab, a monoclonal antibody infusion for those with mild to moderate Covid symptoms and at a high risk of hospitalization.  Pt is qualified for this infusion at the [facility] due to Diabetes   Message left to call back  

## 2019-09-22 ENCOUNTER — Telehealth: Payer: Self-pay | Admitting: Emergency Medicine

## 2019-09-22 NOTE — Telephone Encounter (Signed)
Pt would like to speak with Dr. Mitchel Honour concerning her last visit from 09/16/19. She says her tongue is dry and she is having a hard time speaking. Please advise at [phone].

## 2019-09-23 NOTE — Telephone Encounter (Signed)
Spoke with pt and she stated that she is having a hard time talking. She stated that is tongue is very dry and causing her to drink more than normal. She stated that when she was taken metformin it was causing her the same problems.  Please Advise.

## 2019-09-23 NOTE — Telephone Encounter (Signed)
Spoke with pt and advised her what the provided suggested.

## 2019-09-23 NOTE — Telephone Encounter (Signed)
She has Covid infection/pneumonia.  Most likely symptoms are related to this.  If she is not clinically better she must return to the emergency department.  Thanks.

## 2019-09-28 ENCOUNTER — Ambulatory Visit: Payer: BLUE CROSS/BLUE SHIELD | Admitting: Cardiovascular Disease

## 2019-10-04 ENCOUNTER — Ambulatory Visit (HOSPITAL_COMMUNITY)
Admission: EM | Admit: 2019-10-04 | Discharge: 2019-10-04 | Disposition: A | Discharge status: Home / Self Care | Payer: 59 | Attending: Internal Medicine | Admitting: Internal Medicine

## 2019-10-04 ENCOUNTER — Encounter (HOSPITAL_COMMUNITY): Payer: Self-pay

## 2019-10-04 ENCOUNTER — Other Ambulatory Visit: Payer: Self-pay

## 2019-10-04 DIAGNOSIS — M549 Dorsalgia, unspecified: Secondary | ICD-10-CM | POA: Insufficient documentation

## 2019-10-04 LAB — VITAMIN D 25 HYDROXY (VIT D DEFICIENCY, FRACTURES): Vit D, 25-Hydroxy: 35.1 ng/mL (ref 30–100)

## 2019-10-04 LAB — TSH: TSH: 2.459 u[IU]/mL (ref 0.350–4.500)

## 2019-10-04 NOTE — ED Triage Notes (Signed)
Pt presents to UC with right sided flank pain x 1 week when bend over or sleep on the left side.

## 2019-10-04 NOTE — ED Provider Notes (Signed)
[facility]    CSN: [id number] Arrival date & time: 10/04/19  1451      History   Chief Complaint Chief Complaint  Patient presents with  . Flank Pain    HPI Lindsay Travis is a 59 y.o. female who recently recovered from COVID-19 infection diagnosed on 17 September 2019 comes to urgent care with complaints of right-sided back pain of 1 week duration.  Patient had significant shortness of breath and cough during the COVID-19 infection.  Currently she denies any cough or sputum production.  No fever or chills.  No trauma or falls.  Patient's activity continues to improve.  Pain is of moderate severity currently 8 out of 10, aggravated by movement/palpation of the mid back, no known relieving factors, no radiation to the groin and denies any association with shortness of breath/cough/sputum production.Marland Kitchen   HPI  Past Medical History:  Diagnosis Date  . Allergy   . Anemia   . Clotting disorder ([place])   . Diabetes mellitus    recent high blood sugar and has RX but not taking  med -b/c she doesn't think she  is diabetic, just had lots of sugar in her diet when  dx'd.  Marland Kitchen Hypertension   . Shortness of breath    on exertion- has low hgb    Patient Active Problem List   Diagnosis Date Noted  . Morbid obesity ([place]) 09/18/2019  . Cerebral thrombosis with cerebral infarction 01/10/2018  . Hyperlipidemia   . Type 2 diabetes mellitus with hyperglycemia, without long-term current use of insulin ([place]) 08/07/2015  . Varicose veins of lower extremities with other complications 67/34/1937  . Fibroid, uterus 04/18/2011  . Hypertension associated with diabetes ([place]) 04/18/2011    Past Surgical History:  Procedure Laterality Date  . ABDOMINAL HYSTERECTOMY  09/11/2011   Procedure: HYSTERECTOMY ABDOMINAL;  Surgeon: Frederico Hamman, MD;  Location: [facility];  Service: Gynecology;  Laterality: N/A;    OB History    Gravida  5   Para  5   Term  5   Preterm  0   AB  0   Living  5      SAB  0   TAB  0   Ectopic  0   Multiple  0   Live Births               Home Medications    Prior to Admission medications   Medication Sig Start Date End Date Taking? Authorizing Provider  Accu-Chek FastClix Lancets MISC USE TO TEST FASTING BLOOD GLUCOSE ONCE DAILY 12/22/18   Horald Pollen, MD  ACCU-CHEK GUIDE test strip USE TO CHECK FASTING BLOOD GLUCOSE ONCE DAILY 04/13/18   Jaynee Eagles, PA-C  aspirin 81 MG EC tablet Take 1 tablet (81 mg total) by mouth daily. 03/20/18   Frann Rider, NP  blood glucose meter kit and supplies KIT Please check fasting blood sugar once daily. 10/26/18   Horald Pollen, MD  glipiZIDE (GLUCOTROL) 5 MG tablet TAKE 1 TABLET(5 MG) BY MOUTH TWICE DAILY BEFORE A MEAL 07/15/19   Horald Pollen, MD  amLODipine (NORVASC) 5 MG tablet TAKE 1 TABLET(5 MG) BY MOUTH DAILY 07/14/19 10/04/19  Horald Pollen, MD  gabapentin (NEURONTIN) 300 MG capsule Take 300 mg by mouth 3 (three) times daily. 1 cap 1st week, 2 caps 2 wks, 3 caps 3 wks, continue. Different Provider.  10/04/19  [provider]  hydrochlorothiazide (HYDRODIURIL) 25 MG tablet TAKE 1 TABLET(25 MG)  BY MOUTH DAILY 11/18/18 10/04/19  Horald Pollen, MD    Family History Family History  Problem Relation Age of Onset  . Hypertension Other     Social History Social History   Tobacco Use  . Smoking status: Never Smoker  . Smokeless tobacco: Never Used  Substance Use Topics  . Alcohol use: No  . Drug use: No     Allergies   Lisinopril, Losartan, and Aspirin   Review of Systems Review of Systems  Constitutional: Negative for activity change, chills and fever.  HENT: Negative.   Respiratory: Negative for cough, chest tightness and shortness of breath.   Cardiovascular: Negative for chest pain and palpitations.  Gastrointestinal: Negative for diarrhea, nausea and vomiting.  Genitourinary: Negative.   Musculoskeletal: Negative for arthralgias and  joint swelling.  Skin: Negative for pallor and wound.  Neurological: Negative for dizziness, light-headedness and headaches.  Psychiatric/Behavioral: Negative for confusion and decreased concentration.     Physical Exam Triage Vital Signs ED Triage Vitals  Enc Vitals Group     BP 10/04/19 1601 (!) 153/93     Pulse Rate 10/04/19 1601 75     Resp 10/04/19 1601 19     Temp 10/04/19 1601 98.2 F (36.8 C)     Temp Source 10/04/19 1601 Oral     SpO2 10/04/19 1601 98 %     Weight --      Height --      Head Circumference --      Peak Flow --      Pain Score 10/04/19 1557 8     Pain Loc --      Pain Edu? --      Excl. in [place]? --    No data found.  Updated Vital Signs BP (!) 153/93 (BP Location: Right Arm)   Pulse 75   Temp 98.2 F (36.8 C) (Oral)   Resp 19   LMP 09/10/2011   SpO2 98%   Visual Acuity Right Eye Distance:   Left Eye Distance:   Bilateral Distance:    Right Eye Near:   Left Eye Near:    Bilateral Near:     Physical Exam Constitutional:      General: She is not in acute distress.    Appearance: Normal appearance. She is not ill-appearing.  Cardiovascular:     Rate and Rhythm: Normal rate and regular rhythm.     Pulses: Normal pulses.     Heart sounds: Normal heart sounds.  Pulmonary:     Effort: Pulmonary effort is normal. No respiratory distress.     Breath sounds: No rhonchi or rales.  Chest:     Chest wall: No tenderness.  Abdominal:     General: Bowel sounds are normal. There is no distension.     Tenderness: There is no guarding or rebound.  Musculoskeletal:        General: No swelling or deformity. Normal range of motion.     Cervical back: Normal range of motion. No rigidity.     Comments: Tenderness over the right paraspinal muscle in the lumbar region.  Full range of motion of the thoracolumbar spine.  Lymphadenopathy:     Cervical: No cervical adenopathy.  Skin:    General: Skin is warm.     Capillary Refill: Capillary refill takes  less than 2 seconds.     Findings: No bruising or erythema.  Neurological:     General: No focal deficit present.     Mental Status:  She is alert and oriented to person, place, and time.      UC Treatments / Results  Labs (all labs ordered are listed, but only abnormal results are displayed) Labs Reviewed  TSH  VITAMIN D 25 HYDROXY (VIT D DEFICIENCY, FRACTURES)    EKG   Radiology No results found.  Procedures Procedures (including critical care time)  Medications Ordered in UC Medications - No data to display  Initial Impression / Assessment and Plan / UC Course  I have reviewed the triage vital signs and the nursing notes.  Pertinent labs & imaging results that were available during my care of the patient were reviewed by me and considered in my medical decision making (see chart for details).    1.  Musculoskeletal back pain: Over-the-counter ibuprofen/Tylenol Heating pad Gentle range of motion exercises If patient symptoms worsens she is welcome to return to urgent care to be reevaluated. Final Clinical Impressions(s) / UC Diagnoses   Final diagnoses:  Musculoskeletal back pain   Discharge Instructions   None    ED Prescriptions    None     PDMP not reviewed this encounter.   Chase Picket, MD 10/04/19 1800

## 2019-10-12 NOTE — Progress Notes (Signed)
CARDIOLOGY CONSULT NOTE       Patient ID: Lindsay Travis MRN: [id number] DOB/AGE: 09/12/1960 59 y.o.  Admit date: (Not on file) Referring Physician: Sagardia Primary Physician: Horald Pollen, MD Primary Cardiologist: New Reason for Consultation: Chest Pain  Active Problems:   * No active hospital problems. *   HPI:  59 y.o. with DM, HTN, HLD, varicose veins, CVA Referred by Dr Mitchel Honour for chest pain 09/16/19 seen by primary at [facility] complained of back pain ? Bilateral sciatica Then went to ER 09/17/19 was COVID positive CXR with bibasilar infiltrates Troponin not done ECG not done Last ECG reviewed 08/11/19 was normal SB rate 58   Admitted May 2019 with gait issues MRI with right basal ganglia/coronal radiata infarct due to small vessel disease Thought Due to poorly controlled DM, HLD and HTN.  Started on statin, norvasc asa and plavix DAT for only 3 weeks then asa alone  Echo done 01/11/18 normal EF 60-65% no PFO   Seen in ER again 10/04/15 with back / flank pain. Noted a lot of coughing with COVID infection D/C with diagnosis of musculoskeletal pain   Notes indicate allergies to lisinopril-malaise, losartan - headache and ASA- abdominal pain  She is from [place] here 17 years. 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 Allergy   . Anemia   . Clotting disorder ([place])   . Diabetes mellitus    recent high blood sugar and has RX but not taking  med -b/c she doesn't think she  is diabetic, just had lots of sugar in her diet when  dx'd.  Marland Kitchen Hypertension   . Shortness of breath    on exertion- has low hgb    Family History  Problem Relation Age of Onset  . Hypertension Other     Social History   Socioeconomic History  . Marital status: Divorced    Spouse name: Not on file  . Number of  children: Not on file  . Years of education: Not on file  . Highest education level: Not on file  Occupational History  . Not on file  Tobacco Use  . Smoking status: Never Smoker  . Smokeless tobacco: Never Used  Substance and Sexual Activity  . Alcohol use: No  . Drug use: No  . Sexual activity: Never  Other Topics Concern  . Not on file  Social History Narrative  . Not on file   Social Determinants of Health   Financial Resource Strain:   . Difficulty of Paying Living Expenses: Not on file  Food Insecurity:   . Worried About Charity fundraiser in the Last Year: Not on file  . Ran Out of Food in the Last Year: Not on file  Transportation Needs:   . Lack of Transportation (Medical): Not on file  . Lack of Transportation (Non-Medical): Not on file  Physical Activity:   . Days of Exercise per Week: Not on file  . Minutes of Exercise per Session: Not on file  Stress:   . Feeling of Stress : Not on file  Social Connections:   . Frequency of Communication with Friends and Family: Not on file  . Frequency of Social Gatherings with Friends and Family: Not on file  . Attends Religious Services: Not on file  . Active  Member of Clubs or Organizations: Not on file  . Attends Archivist Meetings: Not on file  . Marital Status: Not on file  Intimate Partner Violence:   . Fear of Current or Ex-Partner: Not on file  . Emotionally Abused: Not on file  . Physically Abused: Not on file  . Sexually Abused: Not on file    Past Surgical History:  Procedure Laterality Date  . ABDOMINAL HYSTERECTOMY  09/11/2011   Procedure: HYSTERECTOMY ABDOMINAL;  Surgeon: Frederico Hamman, MD;  Location: [facility];  Service: Gynecology;  Laterality: N/A;      Current Outpatient Medications:  .  Accu-Chek FastClix Lancets MISC, USE TO TEST FASTING BLOOD GLUCOSE ONCE DAILY, Disp: 102 each, Rfl: 3 .  ACCU-CHEK GUIDE test strip, USE TO CHECK FASTING BLOOD GLUCOSE ONCE DAILY, Disp: 100 each, Rfl:  1 .  amLODipine (NORVASC) 5 MG tablet, Take 5 mg by mouth daily., Disp: , Rfl:  .  aspirin 81 MG EC tablet, Take 1 tablet (81 mg total) by mouth daily., Disp: 90 tablet, Rfl: 3 .  blood glucose meter kit and supplies KIT, Please check fasting blood sugar once daily., Disp: 1 each, Rfl: 0 .  glipiZIDE (GLUCOTROL) 5 MG tablet, TAKE 1 TABLET(5 MG) BY MOUTH TWICE DAILY BEFORE A MEAL, Disp: 180 tablet, Rfl: 0    Physical Exam: Blood pressure (!) 144/96, pulse 72, height '5\' 6"'  (1.676 m), weight 244 lb (110.7 kg), last menstrual period 09/10/2011, SpO2 98 %.    Affect appropriate Overweight black female  HEENT: normal Neck supple with no adenopathy JVP normal no bruits no thyromegaly Lungs clear with no wheezing and good diaphragmatic motion Heart:  S1/S2 no murmur, no rub, gallop or click PMI normal Abdomen: benighn, BS positve, no tenderness, no AAA no bruit.  No HSM or HJR Distal pulses intact with no bruits No edema Neuro non-focal Skin warm and dry No muscular weakness   Labs:   Lab Results  Component Value Date   WBC 4.7 11/17/2018   HGB 11.7 (L) 11/17/2018   HCT 38.1 11/17/2018   MCV 85.4 11/17/2018   PLT 205 11/17/2018   No results for input(s): NA, K, CL, CO2, BUN, CREATININE, CALCIUM, PROT, BILITOT, ALKPHOS, ALT, AST, GLUCOSE in the last 168 hours.  Invalid input(s): LABALBU Lab Results  Component Value Date   CKTOTAL 80 01/09/2018    Lab Results  Component Value Date   CHOL 191 04/28/2019   CHOL 176 06/22/2018   CHOL 205 (H) 01/10/2018   Lab Results  Component Value Date   HDL 58 04/28/2019   HDL 65 06/22/2018   HDL 63 01/10/2018   Lab Results  Component Value Date   LDLCALC 103 (H) 04/28/2019   LDLCALC 98 06/22/2018   LDLCALC 130 (H) 01/10/2018   Lab Results  Component Value Date   TRIG 150 (H) 04/28/2019   TRIG 64 06/22/2018   TRIG 60 01/10/2018   Lab Results  Component Value Date   CHOLHDL 3.3 04/28/2019   CHOLHDL 2.7 06/22/2018   CHOLHDL  3.3 01/10/2018   No results found for: LDLDIRECT    Radiology: No results found.  EKG: 08/11/19 SB rate 58 normal    ASSESSMENT AND PLAN:   1. Chest Pain: atypical with risk factors f/u lexiscan myovue  2.  HLD supposed to be on statin since stroke May 2019 f/u primary  3. DM:  Discussed low carb diet.  Target hemoglobin A1c is 6.5 or less.  Continue  current medications. 4. HTN:  Well controlled.  Continue current medications and low sodium Dash type diet.   5. COVID:  Positive 09/17/19  needs f/u CXR  6. CVA May 2019 due to poorly controlled BP, DM, HLD small vessel ASA Discussed relationship between these risk factors and CVA  7. Dyspnea:  Likely related to obesity f/u echo to assess EF   F/U PRN Echo, lexiscan and CXR ordered  Signed: Jenkins Rouge 10/20/2019, 3:13 PM

## 2019-10-20 ENCOUNTER — Ambulatory Visit (INDEPENDENT_AMBULATORY_CARE_PROVIDER_SITE_OTHER): Payer: 59 | Admitting: Cardiovascular Disease

## 2019-10-20 ENCOUNTER — Encounter: Payer: Self-pay | Admitting: Cardiovascular Disease

## 2019-10-20 ENCOUNTER — Ambulatory Visit
Admission: RE | Admit: 2019-10-20 | Discharge: 2019-10-20 | Disposition: A | Discharge status: Home / Self Care | Payer: 59 | Source: Ambulatory Visit | Attending: Cardiovascular Disease | Admitting: Cardiovascular Disease

## 2019-10-20 ENCOUNTER — Other Ambulatory Visit: Payer: Self-pay

## 2019-10-20 VITALS — BP 144/96 | HR 72 | Ht 66.0 in | Wt 244.0 lb

## 2019-10-20 DIAGNOSIS — R0600 Dyspnea, unspecified: Secondary | ICD-10-CM | POA: Diagnosis not present

## 2019-10-20 DIAGNOSIS — R079 Chest pain, unspecified: Secondary | ICD-10-CM

## 2019-10-20 NOTE — Patient Instructions (Signed)
Medication Instructions:   *If you need a refill on your cardiac medications before your next appointment, please call your pharmacy*  Lab Work:  If you have labs (blood work) drawn today and your tests are completely normal, you will receive your results only by: Marland Kitchen MyChart Message (if you have MyChart) OR . A paper copy in the mail If you have any lab test that is abnormal or we need to change your treatment, we will call you to review the results.  Testing/Procedures: Your physician has requested that you have a lexiscan myoview. For further information please visit [email or web address]. Please follow instruction sheet, as given.  Your physician has requested that you have an echocardiogram. Echocardiography is a painless test that uses sound waves to create images of your heart. It provides your doctor with information about the size and shape of your heart and how well your heart's chambers and valves are working. This procedure takes approximately one hour. There are no restrictions for this procedure.  A chest x-ray takes a picture of the organs and structures inside the chest, including the heart, lungs, and blood vessels. This test can show several things, including, whether the heart is enlarges; whether fluid is building up in the lungs; and whether pacemaker / defibrillator leads are still in place.   Follow-Up: At [facility], you and your health needs are our priority.  As part of our continuing mission to provide you with exceptional heart care, we have created designated Provider Care Teams.  These Care Teams include your primary Cardiologist (physician) and Advanced Practice Providers (APPs -  Physician Assistants and Nurse Practitioners) who all work together to provide you with the care you need, when you need it.  Your next appointment:   As needed  Provider:   You may see Dr. Johnsie Cancel or one of the following Advanced Practice Providers on your designated Care Team:     Truitt Merle, NP  Cecilie Kicks, NP  Kathyrn Drown, NP

## 2019-11-01 ENCOUNTER — Telehealth (HOSPITAL_COMMUNITY): Payer: Self-pay

## 2019-11-01 NOTE — Telephone Encounter (Signed)
Detailed instructions were left on the patient's answering machine. Asked to vall back with any questions. S.Manuel Dall EMTP

## 2019-11-04 ENCOUNTER — Ambulatory Visit (HOSPITAL_COMMUNITY): Payer: 59 | Attending: Cardiovascular Disease

## 2019-11-04 ENCOUNTER — Other Ambulatory Visit: Payer: Self-pay

## 2019-11-04 VITALS — Ht 66.0 in | Wt 244.0 lb

## 2019-11-04 DIAGNOSIS — R079 Chest pain, unspecified: Secondary | ICD-10-CM

## 2019-11-04 DIAGNOSIS — R110 Nausea: Secondary | ICD-10-CM

## 2019-11-04 MED ORDER — AMINOPHYLLINE 25 MG/ML IV SOLN
150.0000 mg | Freq: Once | INTRAVENOUS | Status: AC
  Administered 2019-11-04: 150 mg via INTRAVENOUS

## 2019-11-04 MED ORDER — REGADENOSON 0.4 MG/5ML IV SOLN
0.4000 mg | Freq: Once | INTRAVENOUS | Status: AC
  Administered 2019-11-04: 0.4 mg via INTRAVENOUS

## 2019-11-04 MED ORDER — TECHNETIUM TC 99M TETROFOSMIN IV KIT
32.2000 | PACK | Freq: Once | INTRAVENOUS | Status: AC | PRN
  Administered 2019-11-04: 32.2 via INTRAVENOUS
  Filled 2019-11-04: qty 33

## 2019-11-05 ENCOUNTER — Other Ambulatory Visit (HOSPITAL_COMMUNITY): Payer: 59

## 2019-11-05 ENCOUNTER — Ambulatory Visit (HOSPITAL_COMMUNITY): Payer: 59 | Attending: Cardiology

## 2019-11-05 LAB — MYOCARDIAL PERFUSION IMAGING
LV dias vol: 75 mL (ref 46–106)
LV sys vol: 25 mL
Peak HR: 93 {beats}/min
Rest HR: 64 {beats}/min
SDS: 2
SRS: 0
SSS: 2
TID: 1.1

## 2019-11-05 MED ORDER — TECHNETIUM TC 99M TETROFOSMIN IV KIT
31.7000 | PACK | Freq: Once | INTRAVENOUS | Status: AC | PRN
  Administered 2019-11-05: 31.7 via INTRAVENOUS
  Filled 2019-11-05: qty 32

## 2019-11-10 ENCOUNTER — Encounter: Payer: Self-pay | Admitting: Emergency Medicine

## 2019-11-10 ENCOUNTER — Ambulatory Visit (INDEPENDENT_AMBULATORY_CARE_PROVIDER_SITE_OTHER): Payer: 59 | Admitting: Emergency Medicine

## 2019-11-10 ENCOUNTER — Telehealth: Payer: Self-pay | Admitting: *Deleted

## 2019-11-10 ENCOUNTER — Other Ambulatory Visit: Payer: Self-pay

## 2019-11-10 VITALS — BP 136/78 | HR 74 | Temp 97.8°F | Resp 18 | Ht 66.0 in | Wt 243.0 lb

## 2019-11-10 DIAGNOSIS — E1159 Type 2 diabetes mellitus with other circulatory complications: Secondary | ICD-10-CM

## 2019-11-10 DIAGNOSIS — I152 Hypertension secondary to endocrine disorders: Secondary | ICD-10-CM

## 2019-11-10 DIAGNOSIS — Z8673 Personal history of transient ischemic attack (TIA), and cerebral infarction without residual deficits: Secondary | ICD-10-CM

## 2019-11-10 DIAGNOSIS — I10 Essential (primary) hypertension: Secondary | ICD-10-CM

## 2019-11-10 DIAGNOSIS — U071 COVID-19: Secondary | ICD-10-CM

## 2019-11-10 DIAGNOSIS — E785 Hyperlipidemia, unspecified: Secondary | ICD-10-CM

## 2019-11-10 DIAGNOSIS — E1165 Type 2 diabetes mellitus with hyperglycemia: Secondary | ICD-10-CM

## 2019-11-10 LAB — POCT GLYCOSYLATED HEMOGLOBIN (HGB A1C): Hemoglobin A1C: 8 % — AB (ref 4.0–5.6)

## 2019-11-10 LAB — GLUCOSE, POCT (MANUAL RESULT ENTRY): POC Glucose: 91 mg/dl (ref 70–99)

## 2019-11-10 MED ORDER — TRULICITY 0.75 MG/0.5ML ~~LOC~~ SOAJ: 0.7500 mg | SUBCUTANEOUS | 5 refills | Status: DC

## 2019-11-10 MED ORDER — ROSUVASTATIN CALCIUM 10 MG PO TABS: 10.0000 mg | ORAL_TABLET | Freq: Every day | ORAL | 3 refills | Status: DC

## 2019-11-10 NOTE — Patient Instructions (Addendum)
If you have lab work done today you will be contacted with your lab results within the next 2 weeks.  If you have not heard from [place] then please contact us. The fastest way to get your results is to register for My Chart.   IF you received an x-ray today, you will receive an invoice from [facility]. Please contact [facility] at [phone] with questions or concerns regarding your invoice.   IF you received labwork today, you will receive an invoice from [facility]. Please contact [facility] at [phone] with questions or concerns regarding your invoice.   Our billing staff will not be able to assist you with questions regarding bills from these companies.  You will be contacted with the lab results as soon as they are available. The fastest way to get your results is to activate your My Chart account. Instructions are located on the last page of this paperwork. If you have not heard from [place] regarding the results in 2 weeks, please contact this office.      Diabetes Mellitus and Nutrition, Adult When you have diabetes (diabetes mellitus), it is very important to have healthy eating habits because your blood sugar (glucose) levels are greatly affected by what you eat and drink. Eating healthy foods in the appropriate amounts, at about the same times every day, can help you:  Control your blood glucose.  Lower your risk of heart disease.  Improve your blood pressure.  Reach or maintain a healthy weight. Every person with diabetes is different, and each person has different needs for a meal plan. Your health care provider may recommend that you work with a diet and nutrition specialist (dietitian) to make a meal plan that is best for you. Your meal plan may vary depending on factors such as:  The calories you need.  The medicines you take.  Your weight.  Your blood glucose, blood pressure, and cholesterol levels.  Your activity level.  Other health  conditions you have, such as heart or kidney disease. How do carbohydrates affect me? Carbohydrates, also called carbs, affect your blood glucose level more than any other type of food. Eating carbs naturally raises the amount of glucose in your blood. Carb counting is a method for keeping track of how many carbs you eat. Counting carbs is important to keep your blood glucose at a healthy level, especially if you use insulin or take certain oral diabetes medicines. It is important to know how many carbs you can safely have in each meal. This is different for every person. Your dietitian can help you calculate how many carbs you should have at each meal and for each snack. Foods that contain carbs include:  Bread, cereal, rice, pasta, and crackers.  Potatoes and corn.  Peas, beans, and lentils.  Milk and yogurt.  Fruit and juice.  Desserts, such as cakes, cookies, ice cream, and candy. How does alcohol affect me? Alcohol can cause a sudden decrease in blood glucose (hypoglycemia), especially if you use insulin or take certain oral diabetes medicines. Hypoglycemia can be a life-threatening condition. Symptoms of hypoglycemia (sleepiness, dizziness, and confusion) are similar to symptoms of having too much alcohol. If your health care provider says that alcohol is safe for you, follow these guidelines:  Limit alcohol intake to no more than 1 drink per day for nonpregnant women and 2 drinks per day for men. One drink equals 12 oz of beer, 5 oz of wine, or 1 oz of hard  liquor.  Do not drink on an empty stomach.  Keep yourself hydrated with water, diet soda, or unsweetened iced tea.  Keep in mind that regular soda, juice, and other mixers may contain a lot of sugar and must be counted as carbs. What are tips for following this plan?  Reading food labels  Start by checking the serving size on the "Nutrition Facts" label of packaged foods and drinks. The amount of calories, carbs, fats, and  other nutrients listed on the label is based on one serving of the item. Many items contain more than one serving per package.  Check the total grams (g) of carbs in one serving. You can calculate the number of servings of carbs in one serving by dividing the total carbs by 15. For example, if a food has 30 g of total carbs, it would be equal to 2 servings of carbs.  Check the number of grams (g) of saturated and trans fats in one serving. Choose foods that have low or no amount of these fats.  Check the number of milligrams (mg) of salt (sodium) in one serving. Most people should limit total sodium intake to less than 2,300 mg per day.  Always check the nutrition information of foods labeled as "low-fat" or "nonfat". These foods may be higher in added sugar or refined carbs and should be avoided.  Talk to your dietitian to identify your daily goals for nutrients listed on the label. Shopping  Avoid buying canned, premade, or processed foods. These foods tend to be high in fat, sodium, and added sugar.  Shop around the outside edge of the grocery store. This includes fresh fruits and vegetables, bulk grains, fresh meats, and fresh dairy. Cooking  Use low-heat cooking methods, such as baking, instead of high-heat cooking methods like deep frying.  Cook using healthy oils, such as olive, canola, or sunflower oil.  Avoid cooking with butter, cream, or high-fat meats. Meal planning  Eat meals and snacks regularly, preferably at the same times every day. Avoid going long periods of time without eating.  Eat foods high in fiber, such as fresh fruits, vegetables, beans, and whole grains. Talk to your dietitian about how many servings of carbs you can eat at each meal.  Eat 4-6 ounces (oz) of lean protein each day, such as lean meat, chicken, fish, eggs, or tofu. One oz of lean protein is equal to: ? 1 oz of meat, chicken, or fish. ? 1 egg. ?  cup of tofu.  Eat some foods each day that  contain healthy fats, such as avocado, nuts, seeds, and fish. Lifestyle  Check your blood glucose regularly.  Exercise regularly as told by your health care provider. This may include: ? 150 minutes of moderate-intensity or vigorous-intensity exercise each week. This could be brisk walking, biking, or water aerobics. ? Stretching and doing strength exercises, such as yoga or weightlifting, at least 2 times a week.  Take medicines as told by your health care provider.  Do not use any products that contain nicotine or tobacco, such as cigarettes and e-cigarettes. If you need help quitting, ask your health care provider.  Work with a Social worker or diabetes educator to identify strategies to manage stress and any emotional and social challenges. Questions to ask a health care provider  Do I need to meet with a diabetes educator?  Do I need to meet with a dietitian?  What number can I call if I have questions?  When are the best  times to check my blood glucose? Where to find more information:  American Diabetes Association: diabetes.org  Academy of Nutrition and Dietetics: www.eatright.[employer] of Diabetes and Digestive and Kidney Diseases (NIH): [employer] Summary  A healthy meal plan will help you control your blood glucose and maintain a healthy lifestyle.  Working with a diet and nutrition specialist (dietitian) can help you make a meal plan that is best for you.  Keep in mind that carbohydrates (carbs) and alcohol have immediate effects on your blood glucose levels. It is important to count carbs and to use alcohol carefully. This information is not intended to replace advice given to you by your health care provider. Make sure you discuss any questions you have with your health care provider. Document Revised: 08/08/2017 Document Reviewed: 09/30/2016 Elsevier Patient Education  2020 [employer].

## 2019-11-10 NOTE — Assessment & Plan Note (Signed)
Well-controlled hypertension.  Continue present medications. Uncontrolled diabetes with hemoglobin A1c at 8.0.  Intolerant to Metformin.  Continue glipizide 5 mg twice a day.  I will start Trulicity [id number] mg weekly. Follow-up in 3 months.

## 2019-11-10 NOTE — Telephone Encounter (Signed)
Called [facility] at [address] spoke to [place], cancelled Rosuvastatin because patient has intolerance to statins. Per Dr Mitchel Honour.

## 2019-11-10 NOTE — Assessment & Plan Note (Signed)
Intolerant to statins.  Continue omega-3 supplementation.

## 2019-11-10 NOTE — Progress Notes (Signed)
Lindsay Travis 59 y.o.   Chief Complaint  Patient presents with  . Diabetes  . Hypertension    cardiology 02/10 & 03/1 tests done   . small mole on neck    HISTORY OF PRESENT ILLNESS: This is a 59 y.o. female with history of diabetes and hypertension here for follow-up. BP Readings from Last 3 Encounters:  11/10/19 136/78  10/20/19 (!) 144/96  10/04/19 (!) 153/93  On amlodipine 5 mg. Takes 1 baby aspirin a day. Saw cardiologist last month for atypical chest pain.  Negative work-up. 11/05/2019  Nuclear stress EF: 67%.  The left ventricular ejection fraction is hyperdynamic (>65%).  There was no ST segment deviation noted during stress.  The study is normal.  This is a low risk study.  Normal stress nuclear study with no ischemia or infarction; EF 67% with normal wall motion.  History of stroke in 2019.  Presently on 1 baby aspirin a day.  No residual neurological deficits. History of diabetes on glipizide 5 mg twice a day.  Intolerant to Metformin.  Glucose readings at home 140s. Had Covid pneumonia infection last January.  Recovered well at home.  Asymptomatic now.  History of dyslipidemia.  Intolerant to statins.  Takes omega-3 supplements daily.  HPI   Prior to Admission medications   Medication Sig Start Date End Date Taking? Authorizing Provider  Accu-Chek FastClix Lancets MISC USE TO TEST FASTING BLOOD GLUCOSE ONCE DAILY 12/22/18  Yes Candee Hoon, Ines Bloomer, MD  ACCU-CHEK GUIDE test strip USE TO CHECK FASTING BLOOD GLUCOSE ONCE DAILY 04/13/18  Yes Jaynee Eagles, PA-C  amLODipine (NORVASC) 5 MG tablet Take 5 mg by mouth daily. 08/15/18  Yes [provider]  aspirin 81 MG EC tablet Take 1 tablet (81 mg total) by mouth daily. 03/20/18  Yes McCue, [name], NP  blood glucose meter kit and supplies KIT Please check fasting blood sugar once daily. 10/26/18  Yes Aurora Rody, Ines Bloomer, MD  glipiZIDE (GLUCOTROL) 5 MG tablet TAKE 1 TABLET(5 MG) BY MOUTH TWICE DAILY BEFORE A  MEAL 07/15/19  Yes Elward Nocera, Ines Bloomer, MD  gabapentin (NEURONTIN) 300 MG capsule Take 300 mg by mouth 3 (three) times daily. 1 cap 1st week, 2 caps 2 wks, 3 caps 3 wks, continue. Different Provider.  10/04/19  [provider]  hydrochlorothiazide (HYDRODIURIL) 25 MG tablet TAKE 1 TABLET(25 MG) BY MOUTH DAILY 11/18/18 10/04/19  Horald Pollen, MD    Allergies  Allergen Reactions  . Lisinopril Other (See Comments)    Did not feel good  . Losartan Other (See Comments)    headache  . Aspirin Other (See Comments)    Abdominal pain only 325 mg     Patient Active Problem List   Diagnosis Date Noted  . Morbid obesity ([place]) 09/18/2019  . Cerebral thrombosis with cerebral infarction 01/10/2018  . Hyperlipidemia   . Type 2 diabetes mellitus with hyperglycemia, without long-term current use of insulin ([place]) 08/07/2015  . Varicose veins of lower extremities with other complications 41/96/2229  . Fibroid, uterus 04/18/2011  . Hypertension associated with diabetes ([place]) 04/18/2011    Past Medical History:  Diagnosis Date  . Allergy   . Anemia   . Clotting disorder ([place])   . Diabetes mellitus    recent high blood sugar and has RX but not taking  med -b/c she doesn't think she  is diabetic, just had lots of sugar in her diet when  dx'd.  Marland Kitchen Hypertension   . Shortness of breath  on exertion- has low hgb    Past Surgical History:  Procedure Laterality Date  . ABDOMINAL HYSTERECTOMY  09/11/2011   Procedure: HYSTERECTOMY ABDOMINAL;  Surgeon: Frederico Hamman, MD;  Location: [facility];  Service: Gynecology;  Laterality: N/A;    Social History   Socioeconomic History  . Marital status: Divorced    Spouse name: Not on file  . Number of children: Not on file  . Years of education: Not on file  . Highest education level: Not on file  Occupational History  . Not on file  Tobacco Use  . Smoking status: Never Smoker  . Smokeless tobacco: Never Used  Substance and Sexual  Activity  . Alcohol use: No  . Drug use: No  . Sexual activity: Never  Other Topics Concern  . Not on file  Social History Narrative  . Not on file   Social Determinants of Health   Financial Resource Strain:   . Difficulty of Paying Living Expenses: Not on file  Food Insecurity:   . Worried About Charity fundraiser in the Last Year: Not on file  . Ran Out of Food in the Last Year: Not on file  Transportation Needs:   . Lack of Transportation (Medical): Not on file  . Lack of Transportation (Non-Medical): Not on file  Physical Activity:   . Days of Exercise per Week: Not on file  . Minutes of Exercise per Session: Not on file  Stress:   . Feeling of Stress : Not on file  Social Connections:   . Frequency of Communication with Friends and Family: Not on file  . Frequency of Social Gatherings with Friends and Family: Not on file  . Attends Religious Services: Not on file  . Active Member of Clubs or Organizations: Not on file  . Attends Archivist Meetings: Not on file  . Marital Status: Not on file  Intimate Partner Violence:   . Fear of Current or Ex-Partner: Not on file  . Emotionally Abused: Not on file  . Physically Abused: Not on file  . Sexually Abused: Not on file    Family History  Problem Relation Age of Onset  . Hypertension Other      Review of Systems  Constitutional: Negative.  Negative for chills and fever.  HENT: Negative.  Negative for congestion and sore throat.   Respiratory: Negative.  Negative for cough and shortness of breath.   Cardiovascular: Negative.  Negative for chest pain and palpitations.  Gastrointestinal: Negative for abdominal pain, blood in stool, diarrhea, nausea and vomiting.  Genitourinary: Negative.  Negative for dysuria and hematuria.  Musculoskeletal: Negative.  Negative for back pain and myalgias.  Skin: Negative.   Neurological: Negative.  Negative for dizziness and headaches.  All other systems reviewed and are  negative.  Vitals:   11/10/19 1413  BP: 136/78  Pulse: 74  Resp: 18  Temp: 97.8 F (36.6 C)  SpO2: 97%     Physical Exam Vitals reviewed.  Constitutional:      Appearance: Normal appearance. She is obese.  HENT:     Head: Normocephalic.  Eyes:     Extraocular Movements: Extraocular movements intact.     Conjunctiva/sclera: Conjunctivae normal.     Pupils: Pupils are equal, round, and reactive to light.  Cardiovascular:     Rate and Rhythm: Normal rate and regular rhythm.     Pulses: Normal pulses.     Heart sounds: Normal heart sounds.  Pulmonary:  Effort: Pulmonary effort is normal.     Breath sounds: Normal breath sounds.  Musculoskeletal:        General: Normal range of motion.     Cervical back: Normal range of motion and neck supple.  Skin:    General: Skin is warm and dry.     Capillary Refill: Capillary refill takes less than 2 seconds.  Neurological:     Mental Status: She is alert and oriented to person, place, and time.  Psychiatric:        Mood and Affect: Mood normal.    Results for orders placed or performed in visit on 11/10/19 (from the past 24 hour(s))  POCT glucose (manual entry)     Status: None   Collection Time: 11/10/19  2:41 PM  Result Value Ref Range   POC Glucose 91 70 - 99 mg/dl  POCT glycosylated hemoglobin (Hb A1C)     Status: Abnormal   Collection Time: 11/10/19  2:45 PM  Result Value Ref Range   Hemoglobin A1C 8.0 (A) 4.0 - 5.6 %   HbA1c POC (<> result, manual entry)     HbA1c, POC (prediabetic range)     HbA1c, POC (controlled diabetic range)      A total of 30 minutes was spent with the patient, greater than 50% of which was in counseling/coordination of care regarding multiple medical problems including diabetes and hypertension, review of most recent office visit notes, review of most recent blood work results including today's hemoglobin A1c and glucose, review of all medications, review of new medication Trulicity and how  to administer it, diet and nutrition as well as importance of increasing physical activity, prognosis, and need for follow-up in 3 months.   ASSESSMENT & PLAN: Hypertension associated with diabetes ([place]) Well-controlled hypertension.  Continue present medications. Uncontrolled diabetes with hemoglobin A1c at 8.0.  Intolerant to Metformin.  Continue glipizide 5 mg twice a day.  I will start Trulicity 5.36 mg weekly. Follow-up in 3 months.  Dyslipidemia Intolerant to statins.  Continue omega-3 supplementation.  Avianah was seen today for diabetes, hypertension and small mole on neck.  Diagnoses and all orders for this visit:  Hypertension associated with diabetes ([place]) -     Comprehensive metabolic panel -     POCT glucose (manual entry) -     POCT glycosylated hemoglobin (Hb A1C) -     Microalbumin, urine  Dyslipidemia -     Lipid panel -     Discontinue: rosuvastatin (CRESTOR) 10 MG tablet; Take 1 tablet (10 mg total) by mouth daily.  COVID-19 virus infection Comments: Recent and recovered Orders: -     SAR CoV2 Serology (COVID 19)AB(IGG)IA  Morbid obesity ([place])  History of stroke  Uncontrolled type 2 diabetes mellitus with hyperglycemia ([facility]) -     Dulaglutide (TRULICITY) 4.68 EH/2.1YY SOPN; Inject 0.75 mg into the skin once a week.    Patient Instructions       If you have lab work done today you will be contacted with your lab results within the next 2 weeks.  If you have not heard from [place] then please contact us. The fastest way to get your results is to register for My Chart.   IF you received an x-ray today, you will receive an invoice from [facility]. Please contact [facility] at [phone] with questions or concerns regarding your invoice.   IF you received labwork today, you will receive an invoice from [name]. Please contact [facility] at [phone]  with questions or concerns regarding your invoice.   Our billing staff will not  be able to assist you with questions regarding bills from these companies.  You will be contacted with the lab results as soon as they are available. The fastest way to get your results is to activate your My Chart account. Instructions are located on the last page of this paperwork. If you have not heard from [place] regarding the results in 2 weeks, please contact this office.      Diabetes Mellitus and Nutrition, Adult When you have diabetes (diabetes mellitus), it is very important to have healthy eating habits because your blood sugar (glucose) levels are greatly affected by what you eat and drink. Eating healthy foods in the appropriate amounts, at about the same times every day, can help you:  Control your blood glucose.  Lower your risk of heart disease.  Improve your blood pressure.  Reach or maintain a healthy weight. Every person with diabetes is different, and each person has different needs for a meal plan. Your health care provider may recommend that you work with a diet and nutrition specialist (dietitian) to make a meal plan that is best for you. Your meal plan may vary depending on factors such as:  The calories you need.  The medicines you take.  Your weight.  Your blood glucose, blood pressure, and cholesterol levels.  Your activity level.  Other health conditions you have, such as heart or kidney disease. How do carbohydrates affect me? Carbohydrates, also called carbs, affect your blood glucose level more than any other type of food. Eating carbs naturally raises the amount of glucose in your blood. Carb counting is a method for keeping track of how many carbs you eat. Counting carbs is important to keep your blood glucose at a healthy level, especially if you use insulin or take certain oral diabetes medicines. It is important to know how many carbs you can safely have in each meal. This is different for every person. Your dietitian can help you calculate how many  carbs you should have at each meal and for each snack. Foods that contain carbs include:  Bread, cereal, rice, pasta, and crackers.  Potatoes and corn.  Peas, beans, and lentils.  Milk and yogurt.  Fruit and juice.  Desserts, such as cakes, cookies, ice cream, and candy. How does alcohol affect me? Alcohol can cause a sudden decrease in blood glucose (hypoglycemia), especially if you use insulin or take certain oral diabetes medicines. Hypoglycemia can be a life-threatening condition. Symptoms of hypoglycemia (sleepiness, dizziness, and confusion) are similar to symptoms of having too much alcohol. If your health care provider says that alcohol is safe for you, follow these guidelines:  Limit alcohol intake to no more than 1 drink per day for nonpregnant women and 2 drinks per day for men. One drink equals 12 oz of beer, 5 oz of wine, or 1 oz of hard liquor.  Do not drink on an empty stomach.  Keep yourself hydrated with water, diet soda, or unsweetened iced tea.  Keep in mind that regular soda, juice, and other mixers may contain a lot of sugar and must be counted as carbs. What are tips for following this plan?  Reading food labels  Start by checking the serving size on the "Nutrition Facts" label of packaged foods and drinks. The amount of calories, carbs, fats, and other nutrients listed on the label is based on one serving of the item. Many items  contain more than one serving per package.  Check the total grams (g) of carbs in one serving. You can calculate the number of servings of carbs in one serving by dividing the total carbs by 15. For example, if a food has 30 g of total carbs, it would be equal to 2 servings of carbs.  Check the number of grams (g) of saturated and trans fats in one serving. Choose foods that have low or no amount of these fats.  Check the number of milligrams (mg) of salt (sodium) in one serving. Most people should limit total sodium intake to less  than 2,300 mg per day.  Always check the nutrition information of foods labeled as "low-fat" or "nonfat". These foods may be higher in added sugar or refined carbs and should be avoided.  Talk to your dietitian to identify your daily goals for nutrients listed on the label. Shopping  Avoid buying canned, premade, or processed foods. These foods tend to be high in fat, sodium, and added sugar.  Shop around the outside edge of the grocery store. This includes fresh fruits and vegetables, bulk grains, fresh meats, and fresh dairy. Cooking  Use low-heat cooking methods, such as baking, instead of high-heat cooking methods like deep frying.  Cook using healthy oils, such as olive, canola, or sunflower oil.  Avoid cooking with butter, cream, or high-fat meats. Meal planning  Eat meals and snacks regularly, preferably at the same times every day. Avoid going long periods of time without eating.  Eat foods high in fiber, such as fresh fruits, vegetables, beans, and whole grains. Talk to your dietitian about how many servings of carbs you can eat at each meal.  Eat 4-6 ounces (oz) of lean protein each day, such as lean meat, chicken, fish, eggs, or tofu. One oz of lean protein is equal to: ? 1 oz of meat, chicken, or fish. ? 1 egg. ?  cup of tofu.  Eat some foods each day that contain healthy fats, such as avocado, nuts, seeds, and fish. Lifestyle  Check your blood glucose regularly.  Exercise regularly as told by your health care provider. This may include: ? 150 minutes of moderate-intensity or vigorous-intensity exercise each week. This could be brisk walking, biking, or water aerobics. ? Stretching and doing strength exercises, such as yoga or weightlifting, at least 2 times a week.  Take medicines as told by your health care provider.  Do not use any products that contain nicotine or tobacco, such as cigarettes and e-cigarettes. If you need help quitting, ask your health care  provider.  Work with a Social worker or diabetes educator to identify strategies to manage stress and any emotional and social challenges. Questions to ask a health care provider  Do I need to meet with a diabetes educator?  Do I need to meet with a dietitian?  What number can I call if I have questions?  When are the best times to check my blood glucose? Where to find more information:  American Diabetes Association: diabetes.org  Academy of Nutrition and Dietetics: www.eatright.[employer] of Diabetes and Digestive and Kidney Diseases (NIH): [employer] Summary  A healthy meal plan will help you control your blood glucose and maintain a healthy lifestyle.  Working with a diet and nutrition specialist (dietitian) can help you make a meal plan that is best for you.  Keep in mind that carbohydrates (carbs) and alcohol have immediate effects on your blood glucose levels. It is important to  count carbs and to use alcohol carefully. This information is not intended to replace advice given to you by your health care provider. Make sure you discuss any questions you have with your health care provider. Document Revised: 08/08/2017 Document Reviewed: 09/30/2016 Elsevier Patient Education  2020 Elsevier Inc.      Agustina Caroli, MD [facility]

## 2019-11-11 LAB — COMPREHENSIVE METABOLIC PANEL
ALT: 11 IU/L (ref 0–32)
AST: 19 IU/L (ref 0–40)
Albumin/Globulin Ratio: 1.6 (ref 1.2–2.2)
Albumin: 4.2 g/dL (ref 3.8–4.9)
Alkaline Phosphatase: 66 IU/L (ref 39–117)
BUN/Creatinine Ratio: 16 (ref 9–23)
BUN: 13 mg/dL (ref 6–24)
Bilirubin Total: <0.2 mg/dL (ref 0.0–1.2)
CO2: 26 mmol/L (ref 20–29)
Calcium: 9.7 mg/dL (ref 8.7–10.2)
Chloride: 106 mmol/L (ref 96–106)
Creatinine, Ser: 0.82 mg/dL (ref 0.57–1.00)
GFR calc Af Amer: 92 mL/min/{1.73_m2} (ref >59)
GFR calc non Af Amer: 80 mL/min/{1.73_m2} (ref >59)
Globulin, Total: 2.7 g/dL (ref 1.5–4.5)
Glucose: 88 mg/dL (ref 65–99)
Potassium: 4 mmol/L (ref 3.5–5.2)
Sodium: 145 mmol/L — ABNORMAL HIGH (ref 134–144)
Total Protein: 6.9 g/dL (ref 6.0–8.5)

## 2019-11-11 LAB — LIPID PANEL
Chol/HDL Ratio: 3.1 ratio (ref 0.0–4.4)
Cholesterol, Total: 199 mg/dL (ref 100–199)
HDL: 64 mg/dL (ref >39)
LDL Chol Calc (NIH): 108 mg/dL — ABNORMAL HIGH (ref 0–99)
Triglycerides: 156 mg/dL — ABNORMAL HIGH (ref 0–149)
VLDL Cholesterol Cal: 27 mg/dL (ref 5–40)

## 2019-11-11 LAB — SAR COV2 SEROLOGY (COVID19)AB(IGG),IA: DiaSorin SARS-CoV-2 Ab, IgG: POSITIVE

## 2019-11-12 ENCOUNTER — Encounter: Payer: Self-pay | Admitting: Radiology

## 2019-12-06 ENCOUNTER — Telehealth: Payer: Self-pay | Admitting: Emergency Medicine

## 2019-12-06 NOTE — Telephone Encounter (Signed)
Pt called in wondering if she can go ahead and get her covid vaccine. She is stating she has antibodies for covid. Please advise at [phone].

## 2019-12-06 NOTE — Telephone Encounter (Signed)
Spoke to pt let her know it was okay to go and get this. Pt agreeable

## 2020-02-07 ENCOUNTER — Other Ambulatory Visit: Payer: Self-pay | Admitting: Emergency Medicine

## 2020-02-10 ENCOUNTER — Ambulatory Visit: Payer: 59 | Admitting: Emergency Medicine

## 2020-02-11 ENCOUNTER — Encounter: Payer: Self-pay | Admitting: Emergency Medicine

## 2020-02-16 ENCOUNTER — Ambulatory Visit (INDEPENDENT_AMBULATORY_CARE_PROVIDER_SITE_OTHER): Payer: 59 | Admitting: Emergency Medicine

## 2020-02-16 ENCOUNTER — Other Ambulatory Visit: Payer: Self-pay

## 2020-02-16 ENCOUNTER — Encounter: Payer: Self-pay | Admitting: Emergency Medicine

## 2020-02-16 VITALS — BP 120/78 | HR 89 | Temp 94.9°F | Resp 16 | Ht 66.0 in | Wt 244.0 lb

## 2020-02-16 DIAGNOSIS — E1159 Type 2 diabetes mellitus with other circulatory complications: Secondary | ICD-10-CM

## 2020-02-16 DIAGNOSIS — I10 Essential (primary) hypertension: Secondary | ICD-10-CM

## 2020-02-16 DIAGNOSIS — I152 Hypertension secondary to endocrine disorders: Secondary | ICD-10-CM

## 2020-02-16 LAB — POCT GLYCOSYLATED HEMOGLOBIN (HGB A1C): Hemoglobin A1C: 7.6 % — AB (ref 4.0–5.6)

## 2020-02-16 LAB — GLUCOSE, POCT (MANUAL RESULT ENTRY): POC Glucose: 67 mg/dl — AB (ref 70–99)

## 2020-02-16 MED ORDER — AMLODIPINE BESYLATE 5 MG PO TABS: 5.0000 mg | ORAL_TABLET | Freq: Every day | ORAL | 3 refills | Status: DC

## 2020-02-16 MED ORDER — GLIPIZIDE 5 MG PO TABS: 5.0000 mg | ORAL_TABLET | Freq: Two times a day (BID) | ORAL | 3 refills | Status: DC

## 2020-02-16 MED ORDER — ACCU-CHEK GUIDE VI STRP: ORAL_STRIP | 1 refills | Status: DC

## 2020-02-16 MED ORDER — ROSUVASTATIN CALCIUM 10 MG PO TABS: 10.0000 mg | ORAL_TABLET | Freq: Every day | ORAL | 3 refills | Status: DC

## 2020-02-16 NOTE — Assessment & Plan Note (Signed)
Blood pressure well controlled.  Continue present medication.  No changes. Diabetes still uncontrolled with hemoglobin A1c at 7.6.  Patient did not start Trulicity but she bought the medication as she has with her today.  She was instructed on how to use Trulicity pen.  She had 2 episodes of hypoglycemia last week.  Will decrease glipizide to once a day and start Trulicity.  Diet and nutrition discussed. Follow-up in 3 months.

## 2020-02-16 NOTE — Progress Notes (Signed)
[name] 59 y.o.   Chief Complaint  Patient presents with  . Diabetes    follow up 3 month  . Hypertension   ASSESSMENT & PLAN: Hypertension associated with diabetes ([place]) Well-controlled hypertension.  Continue present medications. Uncontrolled diabetes with hemoglobin A1c at 8.0.  Intolerant to Metformin.  Continue glipizide 5 mg twice a day.  I will start Trulicity 9.32 mg weekly. Follow-up in 3 months.  Dyslipidemia Intolerant to statins.  Continue omega-3 supplementation. HISTORY OF PRESENT ILLNESS: This is a 59 y.o. female with history of diabetes and hypertension here for follow-up. 1.  Diabetes: On glipizide 5 mg twice a day.  Intolerant to Metformin.  Did not start Trulicity as instructed last time.  States that she was afraid of side effects. Lab Results  Component Value Date   HGBA1C 8.0 (A) 11/10/2019   2.  Hypertension: On amlodipine 5 mg. BP Readings from Last 3 Encounters:  02/16/20 120/78  11/10/19 136/78  10/20/19 (!) 144/96   No complaints or medical concerns today.   HPI   Prior to Admission medications   Medication Sig Start Date End Date Taking? Authorizing Provider  amLODipine (NORVASC) 5 MG tablet Take 5 mg by mouth daily. 08/15/18  Yes [provider]  aspirin 81 MG EC tablet Take 1 tablet (81 mg total) by mouth daily. 03/20/18  Yes McCue, [name], NP  glipiZIDE (GLUCOTROL) 5 MG tablet TAKE 1 TABLET(5 MG) BY MOUTH TWICE DAILY BEFORE A MEAL 07/15/19  Yes [place], Ines Bloomer, MD  Accu-Chek FastClix Lancets MISC USE TO TEST FASTING BLOOD GLUCOSE ONCE DAILY 12/22/18   Horald Pollen, MD  ACCU-CHEK GUIDE test strip USE TO CHECK FASTING BLOOD GLUCOSE ONCE DAILY 04/13/18   Jaynee Eagles, PA-C  blood glucose meter kit and supplies KIT Please check fasting blood sugar once daily. 10/26/18   Horald Pollen, MD  Dulaglutide (TRULICITY) 3.55 DD/2.2GU SOPN Inject 0.75 mg into the skin once a week. Patient not taking: Reported on  02/16/2020 11/10/19   Horald Pollen, MD  gabapentin (NEURONTIN) 300 MG capsule Take 300 mg by mouth 3 (three) times daily. 1 cap 1st week, 2 caps 2 wks, 3 caps 3 wks, continue. Different Provider.  10/04/19  [provider]  hydrochlorothiazide (HYDRODIURIL) 25 MG tablet TAKE 1 TABLET(25 MG) BY MOUTH DAILY 11/18/18 10/04/19  Horald Pollen, MD    Allergies  Allergen Reactions  . Lisinopril Other (See Comments)    Did not feel good  . Losartan Other (See Comments)    headache  . Aspirin Other (See Comments)    Abdominal pain only 325 mg     Patient Active Problem List   Diagnosis Date Noted  . Morbid obesity ([place]) 09/18/2019  . Cerebral thrombosis with cerebral infarction 01/10/2018  . Dyslipidemia   . Type 2 diabetes mellitus with hyperglycemia, without long-term current use of insulin ([place]) 08/07/2015  . Varicose veins of lower extremities with other complications 54/27/0623  . Fibroid, uterus 04/18/2011  . Hypertension associated with diabetes ([place]) 04/18/2011    Past Medical History:  Diagnosis Date  . Allergy   . Anemia   . Clotting disorder ([place])   . Diabetes mellitus    recent high blood sugar and has RX but not taking  med -b/c she doesn't think she  is diabetic, just had lots of sugar in her diet when  dx'd.  Marland Kitchen Hypertension   . Shortness of breath    on exertion- has low hgb  Past Surgical History:  Procedure Laterality Date  . ABDOMINAL HYSTERECTOMY  09/11/2011   Procedure: HYSTERECTOMY ABDOMINAL;  Surgeon: Frederico Hamman, MD;  Location: [facility];  Service: Gynecology;  Laterality: N/A;    Social History   Socioeconomic History  . Marital status: Divorced    Spouse name: Not on file  . Number of children: Not on file  . Years of education: Not on file  . Highest education level: Not on file  Occupational History  . Not on file  Tobacco Use  . Smoking status: Never Smoker  . Smokeless tobacco: Never Used  Substance and Sexual  Activity  . Alcohol use: No  . Drug use: No  . Sexual activity: Never  Other Topics Concern  . Not on file  Social History Narrative  . Not on file   Social Determinants of Health   Financial Resource Strain:   . Difficulty of Paying Living Expenses:   Food Insecurity:   . Worried About Charity fundraiser in the Last Year:   . Arboriculturist in the Last Year:   Transportation Needs:   . Film/video editor (Medical):   Marland Kitchen Lack of Transportation (Non-Medical):   Physical Activity:   . Days of Exercise per Week:   . Minutes of Exercise per Session:   Stress:   . Feeling of Stress :   Social Connections:   . Frequency of Communication with Friends and Family:   . Frequency of Social Gatherings with Friends and Family:   . Attends Religious Services:   . Active Member of Clubs or Organizations:   . Attends Archivist Meetings:   Marland Kitchen Marital Status:   Intimate Partner Violence:   . Fear of Current or Ex-Partner:   . Emotionally Abused:   Marland Kitchen Physically Abused:   . Sexually Abused:     Family History  Problem Relation Age of Onset  . Hypertension Other      Review of Systems  Constitutional: Negative.  Negative for chills and fever.  HENT: Negative.  Negative for congestion and sore throat.   Respiratory: Negative.  Negative for cough and shortness of breath.   Cardiovascular: Negative.  Negative for chest pain and palpitations.  Gastrointestinal: Negative.  Negative for abdominal pain, diarrhea, nausea and vomiting.  Genitourinary: Negative.  Negative for dysuria and hematuria.  Musculoskeletal: Negative.  Negative for back pain, myalgias and neck pain.  Skin: Negative.  Negative for rash.  Neurological: Negative.  Negative for dizziness and headaches.  All other systems reviewed and are negative.  Today's Vitals   02/16/20 1409  BP: 120/78  Pulse: 89  Resp: 16  Temp: (!) 94.9 F (34.9 C)  TempSrc: Temporal  SpO2: 96%  Weight: 244 lb (110.7 kg)    Height: _0  (1.676 m)   Body mass index is 39.38 kg/m.   Physical Exam Vitals reviewed.  Constitutional:      Appearance: She is obese.  HENT:     Head: Normocephalic.  Eyes:     Extraocular Movements: Extraocular movements intact.     Pupils: Pupils are equal, round, and reactive to light.  Cardiovascular:     Rate and Rhythm: Normal rate and regular rhythm.     Pulses: Normal pulses.     Heart sounds: Normal heart sounds.  Pulmonary:     Effort: Pulmonary effort is normal.     Breath sounds: Normal breath sounds.  Musculoskeletal:  General: Normal range of motion.     Cervical back: Normal range of motion.  Skin:    General: Skin is warm and dry.     Capillary Refill: Capillary refill takes less than 2 seconds.  Neurological:     General: No focal deficit present.     Mental Status: She is alert and oriented to person, place, and time.  Psychiatric:        Mood and Affect: Mood normal.        Behavior: Behavior normal.    Results for orders placed or performed in visit on 02/16/20 (from the past 24 hour(s))  POCT glucose (manual entry)     Status: Abnormal   Collection Time: 02/16/20  2:35 PM  Result Value Ref Range   POC Glucose 67 (A) 70 - 99 mg/dl  POCT glycosylated hemoglobin (Hb A1C)     Status: Abnormal   Collection Time: 02/16/20  2:48 PM  Result Value Ref Range   Hemoglobin A1C 7.6 (A) 4.0 - 5.6 %   HbA1c POC (<> result, manual entry)     HbA1c, POC (prediabetic range)     HbA1c, POC (controlled diabetic range)     A total of 30 minutes was spent with the patient, greater than 50% of which was in counseling/coordination of care regarding uncontrolled diabetes and cardiovascular risks associated with this, review of all medications and changes, diet and nutrition, review of most recent blood work results including today's hemoglobin A1c, review of most recent office visit notes, prognosis and need for follow-up in 3 months.   ASSESSMENT &  PLAN: Hypertension associated with diabetes ([place]) Blood pressure well controlled.  Continue present medication.  No changes. Diabetes still uncontrolled with hemoglobin A1c at 7.6.  Patient did not start Trulicity but she bought the medication as she has with her today.  She was instructed on how to use Trulicity pen.  She had 2 episodes of hypoglycemia last week.  Will decrease glipizide to once a day and start Trulicity.  Diet and nutrition discussed. Follow-up in 3 months.  Kristena was seen today for diabetes and hypertension.  Diagnoses and all orders for this visit:  Hypertension associated with diabetes ([place]) -     POCT glucose (manual entry) -     POCT glycosylated hemoglobin (Hb A1C) -     glucose blood (ACCU-CHEK GUIDE) test strip; USE TO CHECK FASTING BLOOD GLUCOSE ONCE DAILY -     amLODipine (NORVASC) 5 MG tablet; Take 1 tablet (5 mg total) by mouth daily. -     glipiZIDE (GLUCOTROL) 5 MG tablet; Take 1 tablet (5 mg total) by mouth 2 (two) times daily with a meal. -     rosuvastatin (CRESTOR) 10 MG tablet; Take 1 tablet (10 mg total) by mouth daily.    Patient Instructions       If you have lab work done today you will be contacted with your lab results within the next 2 weeks.  If you have not heard from [place] then please contact us. The fastest way to get your results is to register for My Chart.   IF you received an x-ray today, you will receive an invoice from [facility]. Please contact [facility] at [phone] with questions or concerns regarding your invoice.   IF you received labwork today, you will receive an invoice from [facility]. Please contact [facility] at [phone] with questions or concerns regarding your invoice.   Our billing staff will not be able  to assist you with questions regarding bills from these companies.  You will be contacted with the lab results as soon as they are available. The fastest way to get your results is to  activate your My Chart account. Instructions are located on the last page of this paperwork. If you have not heard from [place] regarding the results in 2 weeks, please contact this office.     Diabetes Mellitus and Nutrition, Adult When you have diabetes (diabetes mellitus), it is very important to have healthy eating habits because your blood sugar (glucose) levels are greatly affected by what you eat and drink. Eating healthy foods in the appropriate amounts, at about the same times every day, can help you:  Control your blood glucose.  Lower your risk of heart disease.  Improve your blood pressure.  Reach or maintain a healthy weight. Every person with diabetes is different, and each person has different needs for a meal plan. Your health care provider may recommend that you work with a diet and nutrition specialist (dietitian) to make a meal plan that is best for you. Your meal plan may vary depending on factors such as:  The calories you need.  The medicines you take.  Your weight.  Your blood glucose, blood pressure, and cholesterol levels.  Your activity level.  Other health conditions you have, such as heart or kidney disease. How do carbohydrates affect me? Carbohydrates, also called carbs, affect your blood glucose level more than any other type of food. Eating carbs naturally raises the amount of glucose in your blood. Carb counting is a method for keeping track of how many carbs you eat. Counting carbs is important to keep your blood glucose at a healthy level, especially if you use insulin or take certain oral diabetes medicines. It is important to know how many carbs you can safely have in each meal. This is different for every person. Your dietitian can help you calculate how many carbs you should have at each meal and for each snack. Foods that contain carbs include:  Bread, cereal, rice, pasta, and crackers.  Potatoes and corn.  Peas, beans, and lentils.  Milk and  yogurt.  Fruit and juice.  Desserts, such as cakes, cookies, ice cream, and candy. How does alcohol affect me? Alcohol can cause a sudden decrease in blood glucose (hypoglycemia), especially if you use insulin or take certain oral diabetes medicines. Hypoglycemia can be a life-threatening condition. Symptoms of hypoglycemia (sleepiness, dizziness, and confusion) are similar to symptoms of having too much alcohol. If your health care provider says that alcohol is safe for you, follow these guidelines:  Limit alcohol intake to no more than 1 drink per day for nonpregnant women and 2 drinks per day for men. One drink equals 12 oz of beer, 5 oz of wine, or 1 oz of hard liquor.  Do not drink on an empty stomach.  Keep yourself hydrated with water, diet soda, or unsweetened iced tea.  Keep in mind that regular soda, juice, and other mixers may contain a lot of sugar and must be counted as carbs. What are tips for following this plan?  Reading food labels  Start by checking the serving size on the "Nutrition Facts" label of packaged foods and drinks. The amount of calories, carbs, fats, and other nutrients listed on the label is based on one serving of the item. Many items contain more than one serving per package.  Check the total grams (g) of carbs in one  serving. You can calculate the number of servings of carbs in one serving by dividing the total carbs by 15. For example, if a food has 30 g of total carbs, it would be equal to 2 servings of carbs.  Check the number of grams (g) of saturated and trans fats in one serving. Choose foods that have low or no amount of these fats.  Check the number of milligrams (mg) of salt (sodium) in one serving. Most people should limit total sodium intake to less than 2,300 mg per day.  Always check the nutrition information of foods labeled as "low-fat" or "nonfat". These foods may be higher in added sugar or refined carbs and should be avoided.  Talk to  your dietitian to identify your daily goals for nutrients listed on the label. Shopping  Avoid buying canned, premade, or processed foods. These foods tend to be high in fat, sodium, and added sugar.  Shop around the outside edge of the grocery store. This includes fresh fruits and vegetables, bulk grains, fresh meats, and fresh dairy. Cooking  Use low-heat cooking methods, such as baking, instead of high-heat cooking methods like deep frying.  Cook using healthy oils, such as olive, canola, or sunflower oil.  Avoid cooking with butter, cream, or high-fat meats. Meal planning  Eat meals and snacks regularly, preferably at the same times every day. Avoid going long periods of time without eating.  Eat foods high in fiber, such as fresh fruits, vegetables, beans, and whole grains. Talk to your dietitian about how many servings of carbs you can eat at each meal.  Eat 4-6 ounces (oz) of lean protein each day, such as lean meat, chicken, fish, eggs, or tofu. One oz of lean protein is equal to: ? 1 oz of meat, chicken, or fish. ? 1 egg. ?  cup of tofu.  Eat some foods each day that contain healthy fats, such as avocado, nuts, seeds, and fish. Lifestyle  Check your blood glucose regularly.  Exercise regularly as told by your health care provider. This may include: ? 150 minutes of moderate-intensity or vigorous-intensity exercise each week. This could be brisk walking, biking, or water aerobics. ? Stretching and doing strength exercises, such as yoga or weightlifting, at least 2 times a week.  Take medicines as told by your health care provider.  Do not use any products that contain nicotine or tobacco, such as cigarettes and e-cigarettes. If you need help quitting, ask your health care provider.  Work with a Social worker or diabetes educator to identify strategies to manage stress and any emotional and social challenges. Questions to ask a health care provider  Do I need to meet with  a diabetes educator?  Do I need to meet with a dietitian?  What number can I call if I have questions?  When are the best times to check my blood glucose? Where to find more information:  American Diabetes Association: diabetes.org  Academy of Nutrition and Dietetics: www.eatright.[employer] of Diabetes and Digestive and Kidney Diseases (NIH): [employer] Summary  A healthy meal plan will help you control your blood glucose and maintain a healthy lifestyle.  Working with a diet and nutrition specialist (dietitian) can help you make a meal plan that is best for you.  Keep in mind that carbohydrates (carbs) and alcohol have immediate effects on your blood glucose levels. It is important to count carbs and to use alcohol carefully. This information is not intended to replace advice given to  you by your health care provider. Make sure you discuss any questions you have with your health care provider. Document Revised: 08/08/2017 Document Reviewed: 09/30/2016 Elsevier Patient Education  2020 [employer]vier Inc.      Agustina Caroli, MD [facility]

## 2020-02-16 NOTE — Patient Instructions (Addendum)
   If you have lab work done today you will be contacted with your lab results within the next 2 weeks.  If you have not heard from us then please contact us. The fastest way to get your results is to register for My Chart.   IF you received an x-ray today, you will receive an invoice from [facility]. Please contact  Radiology at [phone] with questions or concerns regarding your invoice.   IF you received labwork today, you will receive an invoice from [facility]. Please contact [facility] at [phone] with questions or concerns regarding your invoice.   Our billing staff will not be able to assist you with questions regarding bills from these companies.  You will be contacted with the lab results as soon as they are available. The fastest way to get your results is to activate your My Chart account. Instructions are located on the last page of this paperwork. If you have not heard from us regarding the results in 2 weeks, please contact this office.     Diabetes Mellitus and Nutrition, Adult When you have diabetes (diabetes mellitus), it is very important to have healthy eating habits because your blood sugar (glucose) levels are greatly affected by what you eat and drink. Eating healthy foods in the appropriate amounts, at about the same times every day, can help you:  Control your blood glucose.  Lower your risk of heart disease.  Improve your blood pressure.  Reach or maintain a healthy weight. Every person with diabetes is different, and each person has different needs for a meal plan. Your health care provider may recommend that you work with a diet and nutrition specialist (dietitian) to make a meal plan that is best for you. Your meal plan may vary depending on factors such as:  The calories you need.  The medicines you take.  Your weight.  Your blood glucose, blood pressure, and cholesterol levels.  Your activity level.  Other health conditions  you have, such as heart or kidney disease. How do carbohydrates affect me? Carbohydrates, also called carbs, affect your blood glucose level more than any other type of food. Eating carbs naturally raises the amount of glucose in your blood. Carb counting is a method for keeping track of how many carbs you eat. Counting carbs is important to keep your blood glucose at a healthy level, especially if you use insulin or take certain oral diabetes medicines. It is important to know how many carbs you can safely have in each meal. This is different for every person. Your dietitian can help you calculate how many carbs you should have at each meal and for each snack. Foods that contain carbs include:  Bread, cereal, rice, pasta, and crackers.  Potatoes and corn.  Peas, beans, and lentils.  Milk and yogurt.  Fruit and juice.  Desserts, such as cakes, cookies, ice cream, and candy. How does alcohol affect me? Alcohol can cause a sudden decrease in blood glucose (hypoglycemia), especially if you use insulin or take certain oral diabetes medicines. Hypoglycemia can be a life-threatening condition. Symptoms of hypoglycemia (sleepiness, dizziness, and confusion) are similar to symptoms of having too much alcohol. If your health care provider says that alcohol is safe for you, follow these guidelines:  Limit alcohol intake to no more than 1 drink per day for nonpregnant women and 2 drinks per day for men. One drink equals 12 oz of beer, 5 oz of wine, or 1 oz of hard liquor.    Do not drink on an empty stomach.  Keep yourself hydrated with water, diet soda, or unsweetened iced tea.  Keep in mind that regular soda, juice, and other mixers may contain a lot of sugar and must be counted as carbs. What are tips for following this plan?  Reading food labels  Start by checking the serving size on the "Nutrition Facts" label of packaged foods and drinks. The amount of calories, carbs, fats, and other  nutrients listed on the label is based on one serving of the item. Many items contain more than one serving per package.  Check the total grams (g) of carbs in one serving. You can calculate the number of servings of carbs in one serving by dividing the total carbs by 15. For example, if a food has 30 g of total carbs, it would be equal to 2 servings of carbs.  Check the number of grams (g) of saturated and trans fats in one serving. Choose foods that have low or no amount of these fats.  Check the number of milligrams (mg) of salt (sodium) in one serving. Most people should limit total sodium intake to less than 2,300 mg per day.  Always check the nutrition information of foods labeled as "low-fat" or "nonfat". These foods may be higher in added sugar or refined carbs and should be avoided.  Talk to your dietitian to identify your daily goals for nutrients listed on the label. Shopping  Avoid buying canned, premade, or processed foods. These foods tend to be high in fat, sodium, and added sugar.  Shop around the outside edge of the grocery store. This includes fresh fruits and vegetables, bulk grains, fresh meats, and fresh dairy. Cooking  Use low-heat cooking methods, such as baking, instead of high-heat cooking methods like deep frying.  Cook using healthy oils, such as olive, canola, or sunflower oil.  Avoid cooking with butter, cream, or high-fat meats. Meal planning  Eat meals and snacks regularly, preferably at the same times every day. Avoid going long periods of time without eating.  Eat foods high in fiber, such as fresh fruits, vegetables, beans, and whole grains. Talk to your dietitian about how many servings of carbs you can eat at each meal.  Eat 4-6 ounces (oz) of lean protein each day, such as lean meat, chicken, fish, eggs, or tofu. One oz of lean protein is equal to: ? 1 oz of meat, chicken, or fish. ? 1 egg. ?  cup of tofu.  Eat some foods each day that contain  healthy fats, such as avocado, nuts, seeds, and fish. Lifestyle  Check your blood glucose regularly.  Exercise regularly as told by your health care provider. This may include: ? 150 minutes of moderate-intensity or vigorous-intensity exercise each week. This could be brisk walking, biking, or water aerobics. ? Stretching and doing strength exercises, such as yoga or weightlifting, at least 2 times a week.  Take medicines as told by your health care provider.  Do not use any products that contain nicotine or tobacco, such as cigarettes and e-cigarettes. If you need help quitting, ask your health care provider.  Work with a counselor or diabetes educator to identify strategies to manage stress and any emotional and social challenges. Questions to ask a health care provider  Do I need to meet with a diabetes educator?  Do I need to meet with a dietitian?  What number can I call if I have questions?  When are the best times to   times to check my blood glucose? Where to find more information:  American Diabetes Association: diabetes.org  Academy of Nutrition and Dietetics: www.eatright.org  National Institute of Diabetes and Digestive and Kidney Diseases (NIH): www.niddk.nih.gov Summary  A healthy meal plan will help you control your blood glucose and maintain a healthy lifestyle.  Working with a diet and nutrition specialist (dietitian) can help you make a meal plan that is best for you.  Keep in mind that carbohydrates (carbs) and alcohol have immediate effects on your blood glucose levels. It is important to count carbs and to use alcohol carefully. This information is not intended to replace advice given to you by your health care provider. Make sure you discuss any questions you have with your health care provider. Document Revised: 08/08/2017 Document Reviewed: 09/30/2016 Elsevier Patient Education  2020 Elsevier Inc.  

## 2020-02-22 ENCOUNTER — Telehealth: Payer: Self-pay

## 2020-02-22 DIAGNOSIS — E1165 Type 2 diabetes mellitus with hyperglycemia: Secondary | ICD-10-CM

## 2020-02-22 MED ORDER — BLOOD GLUCOSE METER KIT: PACK | 11 refills | Status: DC

## 2020-02-22 NOTE — Telephone Encounter (Signed)
Pt accu check test strips are no longer covered and pt will need a new meter as theres no other strips that are compatible with her meter could you send in a new supply for testing.

## 2020-02-22 NOTE — Telephone Encounter (Signed)
Copied from [place] [phone]. Topic: Quick Communication - Rx Refill/Question >> Feb 22, 2020  2:01 PM Erick Blinks wrote: Pt called in regard to her missing test strips, states that the pharmacy instructed her to ask her PCP to confirm this refill with her insurance.  glucose blood (ACCU-CHEK GUIDE) test strip [id number] [address], [place] [address] [place] [address] Phone: [phone] Fax: [phone]

## 2020-02-22 NOTE — Addendum Note (Signed)
Addended by: Rutherford Guys on: 02/22/2020 07:08 PM   Modules accepted: Orders

## 2020-04-12 LAB — HM DIABETES EYE EXAM

## 2020-04-13 ENCOUNTER — Encounter: Payer: Self-pay | Admitting: *Deleted

## 2020-05-17 ENCOUNTER — Ambulatory Visit: Payer: 59 | Admitting: Emergency Medicine

## 2020-05-18 ENCOUNTER — Encounter: Payer: Self-pay | Admitting: Emergency Medicine

## 2020-05-24 ENCOUNTER — Telehealth: Payer: Self-pay | Admitting: Emergency Medicine

## 2020-05-29 ENCOUNTER — Telehealth: Payer: Self-pay | Admitting: *Deleted

## 2020-05-29 NOTE — Telephone Encounter (Signed)
Schedule mammogram for mobile [facility] [phone] [facility]

## 2021-01-09 ENCOUNTER — Ambulatory Visit (INDEPENDENT_AMBULATORY_CARE_PROVIDER_SITE_OTHER): Payer: 59 | Admitting: Emergency Medicine

## 2021-01-09 ENCOUNTER — Other Ambulatory Visit: Payer: Self-pay

## 2021-01-09 ENCOUNTER — Encounter: Payer: Self-pay | Admitting: Emergency Medicine

## 2021-01-09 VITALS — BP 116/88 | HR 86 | Temp 98.6°F | Ht 66.0 in | Wt 243.6 lb

## 2021-01-09 DIAGNOSIS — Z8673 Personal history of transient ischemic attack (TIA), and cerebral infarction without residual deficits: Secondary | ICD-10-CM

## 2021-01-09 DIAGNOSIS — Z0001 Encounter for general adult medical examination with abnormal findings: Secondary | ICD-10-CM

## 2021-01-09 DIAGNOSIS — E1159 Type 2 diabetes mellitus with other circulatory complications: Secondary | ICD-10-CM | POA: Diagnosis not present

## 2021-01-09 DIAGNOSIS — T466X5A Adverse effect of antihyperlipidemic and antiarteriosclerotic drugs, initial encounter: Secondary | ICD-10-CM

## 2021-01-09 DIAGNOSIS — Z9119 Patient's noncompliance with other medical treatment and regimen: Secondary | ICD-10-CM

## 2021-01-09 DIAGNOSIS — E785 Hyperlipidemia, unspecified: Secondary | ICD-10-CM | POA: Diagnosis not present

## 2021-01-09 DIAGNOSIS — Z9989 Dependence on other enabling machines and devices: Secondary | ICD-10-CM

## 2021-01-09 DIAGNOSIS — E1165 Type 2 diabetes mellitus with hyperglycemia: Secondary | ICD-10-CM

## 2021-01-09 DIAGNOSIS — E669 Obesity, unspecified: Secondary | ICD-10-CM

## 2021-01-09 DIAGNOSIS — E1169 Type 2 diabetes mellitus with other specified complication: Secondary | ICD-10-CM

## 2021-01-09 DIAGNOSIS — Z1211 Encounter for screening for malignant neoplasm of colon: Secondary | ICD-10-CM

## 2021-01-09 DIAGNOSIS — I152 Hypertension secondary to endocrine disorders: Secondary | ICD-10-CM

## 2021-01-09 DIAGNOSIS — E119 Type 2 diabetes mellitus without complications: Secondary | ICD-10-CM | POA: Insufficient documentation

## 2021-01-09 DIAGNOSIS — G4733 Obstructive sleep apnea (adult) (pediatric): Secondary | ICD-10-CM

## 2021-01-09 DIAGNOSIS — G720 Drug-induced myopathy: Secondary | ICD-10-CM

## 2021-01-09 DIAGNOSIS — Z91199 Patient's noncompliance with other medical treatment and regimen due to unspecified reason: Secondary | ICD-10-CM

## 2021-01-09 DIAGNOSIS — Z1231 Encounter for screening mammogram for malignant neoplasm of breast: Secondary | ICD-10-CM

## 2021-01-09 DIAGNOSIS — Z789 Other specified health status: Secondary | ICD-10-CM

## 2021-01-09 LAB — CBC WITH DIFFERENTIAL/PLATELET
Basophils Absolute: 0 10*3/uL (ref 0.0–0.1)
Basophils Relative: 0.9 % (ref 0.0–3.0)
Eosinophils Absolute: 0 10*3/uL (ref 0.0–0.7)
Eosinophils Relative: 0.7 % (ref 0.0–5.0)
HCT: 39 % (ref 36.0–46.0)
Hemoglobin: 12.9 g/dL (ref 12.0–15.0)
Lymphocytes Relative: 49.2 % — ABNORMAL HIGH (ref 12.0–46.0)
Lymphs Abs: 2.1 10*3/uL (ref 0.7–4.0)
MCHC: 33.1 g/dL (ref 30.0–36.0)
MCV: 84.3 fl (ref 78.0–100.0)
Monocytes Absolute: 0.3 10*3/uL (ref 0.1–1.0)
Monocytes Relative: 7.3 % (ref 3.0–12.0)
Neutro Abs: 1.8 10*3/uL (ref 1.4–7.7)
Neutrophils Relative %: 41.9 % — ABNORMAL LOW (ref 43.0–77.0)
Platelets: 199 10*3/uL (ref 150.0–400.0)
RBC: 4.63 Mil/uL (ref 3.87–5.11)
RDW: 14.3 % (ref 11.5–15.5)
WBC: 4.3 10*3/uL (ref 4.0–10.5)

## 2021-01-09 LAB — COMPREHENSIVE METABOLIC PANEL
ALT: 20 U/L (ref 0–35)
AST: 16 U/L (ref 0–37)
Albumin: 4.1 g/dL (ref 3.5–5.2)
Alkaline Phosphatase: 61 U/L (ref 39–117)
BUN: 16 mg/dL (ref 6–23)
CO2: 31 mEq/L (ref 19–32)
Calcium: 9.6 mg/dL (ref 8.4–10.5)
Chloride: 103 mEq/L (ref 96–112)
Creatinine, Ser: 0.84 mg/dL (ref 0.40–1.20)
GFR: 76.4 mL/min (ref >60.00)
Glucose, Bld: 129 mg/dL — ABNORMAL HIGH (ref 70–99)
Potassium: 4 mEq/L (ref 3.5–5.1)
Sodium: 139 mEq/L (ref 135–145)
Total Bilirubin: 0.3 mg/dL (ref 0.2–1.2)
Total Protein: 7.2 g/dL (ref 6.0–8.3)

## 2021-01-09 LAB — LIPID PANEL
Cholesterol: 212 mg/dL — ABNORMAL HIGH (ref 0–200)
HDL: 62 mg/dL (ref >39.00)
LDL Cholesterol: 131 mg/dL — ABNORMAL HIGH (ref 0–99)
NonHDL: 149.91
Total CHOL/HDL Ratio: 3
Triglycerides: 97 mg/dL (ref 0.0–149.0)
VLDL: 19.4 mg/dL (ref 0.0–40.0)

## 2021-01-09 LAB — POCT GLYCOSYLATED HEMOGLOBIN (HGB A1C): Hemoglobin A1C: 8.5 % — AB (ref 4.0–5.6)

## 2021-01-09 LAB — TSH: TSH: 2.67 u[IU]/mL (ref 0.35–4.50)

## 2021-01-09 MED ORDER — EZETIMIBE 10 MG PO TABS: 10.0000 mg | ORAL_TABLET | Freq: Every day | ORAL | 3 refills | Status: DC

## 2021-01-09 MED ORDER — TRULICITY 1.5 MG/0.5ML ~~LOC~~ SOAJ: 1.5000 mg | SUBCUTANEOUS | 5 refills | Status: DC

## 2021-01-09 NOTE — Patient Instructions (Signed)
Diabetes Mellitus and Nutrition, Adult When you have diabetes, or diabetes mellitus, it is very important to have healthy eating habits because your blood sugar (glucose) levels are greatly affected by what you eat and drink. Eating healthy foods in the right amounts, at about the same times every day, can help you:  Control your blood glucose.  Lower your risk of heart disease.  Improve your blood pressure.  Reach or maintain a healthy weight. What can affect my meal plan? Every person with diabetes is different, and each person has different needs for a meal plan. Your health care provider may recommend that you work with a dietitian to make a meal plan that is best for you. Your meal plan may vary depending on factors such as:  The calories you need.  The medicines you take.  Your weight.  Your blood glucose, blood pressure, and cholesterol levels.  Your activity level.  Other health conditions you have, such as heart or kidney disease. How do carbohydrates affect me? Carbohydrates, also called carbs, affect your blood glucose level more than any other type of food. Eating carbs naturally raises the amount of glucose in your blood. Carb counting is a method for keeping track of how many carbs you eat. Counting carbs is important to keep your blood glucose at a healthy level, especially if you use insulin or take certain oral diabetes medicines. It is important to know how many carbs you can safely have in each meal. This is different for every person. Your dietitian can help you calculate how many carbs you should have at each meal and for each snack. How does alcohol affect me? Alcohol can cause a sudden decrease in blood glucose (hypoglycemia), especially if you use insulin or take certain oral diabetes medicines. Hypoglycemia can be a life-threatening condition. Symptoms of hypoglycemia, such as sleepiness, dizziness, and confusion, are similar to symptoms of having too much  alcohol.  Do not drink alcohol if: ? Your health care provider tells you not to drink. ? You are pregnant, may be pregnant, or are planning to become pregnant.  If you drink alcohol: ? Do not drink on an empty stomach. ? Limit how much you use to:  0-1 drink a day for women.  0-2 drinks a day for men. ? Be aware of how much alcohol is in your drink. In the [place] one drink equals one 12 oz bottle of beer (355 mL), one 5 oz glass of wine (148 mL), or one 1 oz glass of hard liquor (44 mL). ? Keep yourself hydrated with water, diet soda, or unsweetened iced tea.  Keep in mind that regular soda, juice, and other mixers may contain a lot of sugar and must be counted as carbs. What are tips for following this plan? Reading food labels  Start by checking the serving size on the "Nutrition Facts" label of packaged foods and drinks. The amount of calories, carbs, fats, and other nutrients listed on the label is based on one serving of the item. Many items contain more than one serving per package.  Check the total grams (g) of carbs in one serving. You can calculate the number of servings of carbs in one serving by dividing the total carbs by 15. For example, if a food has 30 g of total carbs per serving, it would be equal to 2 servings of carbs.  Check the number of grams (g) of saturated fats and trans fats in one serving. Choose foods that have   a low amount or none of these fats.  Check the number of milligrams (mg) of salt (sodium) in one serving. Most people should limit total sodium intake to less than 2,300 mg per day.  Always check the nutrition information of foods labeled as "low-fat" or "nonfat." These foods may be higher in added sugar or refined carbs and should be avoided.  Talk to your dietitian to identify your daily goals for nutrients listed on the label. Shopping  Avoid buying canned, pre-made, or processed foods. These foods tend to be high in fat, sodium, and added  sugar.  Shop around the outside edge of the grocery store. This is where you will most often find fresh fruits and vegetables, bulk grains, fresh meats, and fresh dairy. Cooking  Use low-heat cooking methods, such as baking, instead of high-heat cooking methods like deep frying.  Cook using healthy oils, such as olive, canola, or sunflower oil.  Avoid cooking with butter, cream, or high-fat meats. Meal planning  Eat meals and snacks regularly, preferably at the same times every day. Avoid going long periods of time without eating.  Eat foods that are high in fiber, such as fresh fruits, vegetables, beans, and whole grains. Talk with your dietitian about how many servings of carbs you can eat at each meal.  Eat 4-6 oz (112-168 g) of lean protein each day, such as lean meat, chicken, fish, eggs, or tofu. One ounce (oz) of lean protein is equal to: ? 1 oz (28 g) of meat, chicken, or fish. ? 1 egg. ?  cup (62 g) of tofu.  Eat some foods each day that contain healthy fats, such as avocado, nuts, seeds, and fish.   What foods should I eat? Fruits Berries. Apples. Oranges. Peaches. Apricots. Plums. Grapes. Mango. Papaya. Pomegranate. Kiwi. Cherries. Vegetables Lettuce. Spinach. Leafy greens, including kale, chard, collard greens, and mustard greens. Beets. Cauliflower. Cabbage. Broccoli. Carrots. Green beans. Tomatoes. Peppers. Onions. Cucumbers. Brussels sprouts. Grains Whole grains, such as whole-wheat or whole-grain bread, crackers, tortillas, cereal, and pasta. Unsweetened oatmeal. Quinoa. Brown or wild rice. Meats and other proteins Seafood. Poultry without skin. Lean cuts of poultry and beef. Tofu. Nuts. Seeds. Dairy Low-fat or fat-free dairy products such as milk, yogurt, and cheese. The items listed above may not be a complete list of foods and beverages you can eat. Contact a dietitian for more information. What foods should I avoid? Fruits Fruits canned with  syrup. Vegetables Canned vegetables. Frozen vegetables with butter or cream sauce. Grains Refined white flour and flour products such as bread, pasta, snack foods, and cereals. Avoid all processed foods. Meats and other proteins Fatty cuts of meat. Poultry with skin. Breaded or fried meats. Processed meat. Avoid saturated fats. Dairy Full-fat yogurt, cheese, or milk. Beverages Sweetened drinks, such as soda or iced tea. The items listed above may not be a complete list of foods and beverages you should avoid. Contact a dietitian for more information. Questions to ask a health care provider  Do I need to meet with a diabetes educator?  Do I need to meet with a dietitian?  What number can I call if I have questions?  When are the best times to check my blood glucose? Where to find more information:  American Diabetes Association: diabetes.org  Academy of Nutrition and Dietetics: www.eatright.org  National Institute of Diabetes and Digestive and Kidney Diseases: www.niddk.nih.gov  Association of Diabetes Care and Education Specialists: www.diabeteseducator.org Summary  It is important to have healthy eating   habits because your blood sugar (glucose) levels are greatly affected by what you eat and drink.  A healthy meal plan will help you control your blood glucose and maintain a healthy lifestyle.  Your health care provider may recommend that you work with a dietitian to make a meal plan that is best for you.  Keep in mind that carbohydrates (carbs) and alcohol have immediate effects on your blood glucose levels. It is important to count carbs and to use alcohol carefully. This information is not intended to replace advice given to you by your health care provider. Make sure you discuss any questions you have with your health care provider. Document Revised: 08/03/2019 Document Reviewed: 08/03/2019 Elsevier Patient Education  2021 Elsevier Inc.  

## 2021-01-09 NOTE — Progress Notes (Signed)
Lindsay Travis 60 y.o.   Chief Complaint  Patient presents with  . Annual Exam   Last 2 office visit notes are as follows: ASSESSMENT & PLAN: Hypertension associated with diabetes ([place]) Well-controlled hypertension.  Continue present medications. Uncontrolled diabetes with hemoglobin A1c at 8.0.  Intolerant to Metformin.  Continue glipizide 5 mg twice a day.  I will start Trulicity 8.75 mg weekly. Follow-up in 3 months. ASSESSMENT & PLAN: Hypertension associated with diabetes ([place]) Blood pressure well controlled.  Continue present medication.  No changes. Diabetes still uncontrolled with hemoglobin A1c at 7.6.  Patient did not start Trulicity but she bought the medication as she has with her today.  She was instructed on how to use Trulicity pen.  She had 2 episodes of hypoglycemia last week.  Will decrease glipizide to once a day and start Trulicity.  Diet and nutrition discussed. Follow-up in 3 months.  HISTORY OF PRESENT ILLNESS: This is a 60 y.o. female here for annual exam. Patient has a history of diabetes hypertension and dyslipidemia. 1.  Hypertension: On amlodipine 5 mg daily and hydrochlorothiazide 25 mg daily. 2.  Diabetes: Noncompliant with medications.  Was started on glipizide 5 mg twice a day and Trulicity 6.43 mg weekly but not taking the latter.  Noncompliant with follow-up visit as well.  Noncompliant with diet. Lab Results  Component Value Date   HGBA1C 7.6 (A) 02/16/2020  #3 dyslipidemia: On rosuvastatin 10 mg daily but not taking it. Complaining of occasional numbness and tingling to both hands and feet. Needs referrals for mammogram and colonoscopy. No other complaint or medical concerns today.   HPI   Prior to Admission medications   Medication Sig Start Date End Date Taking? Authorizing Provider  amLODipine (NORVASC) 5 MG tablet Take 1 tablet (5 mg total) by mouth daily. 02/16/20  Yes SagardiaInes Bloomer, MD  aspirin 81 MG EC tablet Take 1 tablet (81  mg total) by mouth daily. 03/20/18  Yes McCue, [name], NP  Dulaglutide (TRULICITY) 3.29 JJ/8.8CZ SOPN Inject 0.75 mg into the skin once a week. 11/10/19  Yes Raegan Winders, Ines Bloomer, MD  rosuvastatin (CRESTOR) 10 MG tablet Take 1 tablet (10 mg total) by mouth daily. 02/16/20  Yes Carman Auxier, Ines Bloomer, MD  blood glucose meter kit and supplies Per insurance preference. Check daily fasting blood glucose. (Dx. E11.9). 02/22/20   Jacelyn Pi, Lilia Argue, MD  glipiZIDE (GLUCOTROL) 5 MG tablet Take 1 tablet (5 mg total) by mouth 2 (two) times daily with a meal. 02/16/20 05/16/20  Desani Sprung, Ines Bloomer, MD  gabapentin (NEURONTIN) 300 MG capsule Take 300 mg by mouth 3 (three) times daily. 1 cap 1st week, 2 caps 2 wks, 3 caps 3 wks, continue. Different Provider.  10/04/19  [provider]  hydrochlorothiazide (HYDRODIURIL) 25 MG tablet TAKE 1 TABLET(25 MG) BY MOUTH DAILY 11/18/18 10/04/19  Horald Pollen, MD    Allergies  Allergen Reactions  . Lisinopril Other (See Comments)    Did not feel good  . Losartan Other (See Comments)    headache  . Aspirin Other (See Comments)    Abdominal pain only 325 mg     Patient Active Problem List   Diagnosis Date Noted  . Morbid obesity ([place]) 09/18/2019  . Cerebral thrombosis with cerebral infarction 01/10/2018  . Dyslipidemia   . Type 2 diabetes mellitus with hyperglycemia, without long-term current use of insulin ([place]) 08/07/2015  . Varicose veins of lower extremities with other complications 66/02/3015  . Fibroid, uterus 04/18/2011  .  Hypertension associated with diabetes ([place]) 04/18/2011    Past Medical History:  Diagnosis Date  . Allergy   . Anemia   . Clotting disorder ([place])   . Diabetes mellitus    recent high blood sugar and has RX but not taking  med -b/c she doesn't think she  is diabetic, just had lots of sugar in her diet when  dx'd.  Marland Kitchen Hypertension   . Shortness of breath    on exertion- has low hgb    Past Surgical History:   Procedure Laterality Date  . ABDOMINAL HYSTERECTOMY  09/11/2011   Procedure: HYSTERECTOMY ABDOMINAL;  Surgeon: Frederico Hamman, MD;  Location: [facility];  Service: Gynecology;  Laterality: N/A;    Social History   Socioeconomic History  . Marital status: Divorced    Spouse name: Not on file  . Number of children: Not on file  . Years of education: Not on file  . Highest education level: Not on file  Occupational History  . Not on file  Tobacco Use  . Smoking status: Never Smoker  . Smokeless tobacco: Never Used  Substance and Sexual Activity  . Alcohol use: No  . Drug use: No  . Sexual activity: Never  Other Topics Concern  . Not on file  Social History Narrative  . Not on file   Social Determinants of Health   Financial Resource Strain: Not on file  Food Insecurity: Not on file  Transportation Needs: Not on file  Physical Activity: Not on file  Stress: Not on file  Social Connections: Not on file  Intimate Partner Violence: Not on file    Family History  Problem Relation Age of Onset  . Hypertension Other      Review of Systems  Constitutional: Negative.  Negative for chills and fever.  HENT: Negative.  Negative for congestion and sore throat.   Respiratory: Negative.  Negative for cough and shortness of breath.   Cardiovascular: Negative.  Negative for chest pain and palpitations.  Gastrointestinal: Negative.  Negative for abdominal pain, diarrhea, nausea and vomiting.  Genitourinary: Negative.  Negative for dysuria and hematuria.  Musculoskeletal: Negative.  Negative for back pain, myalgias and neck pain.  Skin: Negative.  Negative for rash.  Neurological: Negative for dizziness and headaches.       Occasional tingling to both hands and feet  All other systems reviewed and are negative.     Today's Vitals   01/09/21 1407  BP: 116/88  Pulse: 86  Temp: 98.6 F (37 C)  TempSrc: Oral  SpO2: 98%  Weight: 243 lb 9.6 oz (110.5 kg)  Height: '5\' 6"'  (1.676  m)   Body mass index is 39.32 kg/m. Wt Readings from Last 3 Encounters:  01/09/21 243 lb 9.6 oz (110.5 kg)  02/16/20 244 lb (110.7 kg)  11/10/19 243 lb (110.2 kg)    Physical Exam Vitals reviewed.  Constitutional:      Appearance: Normal appearance. She is obese.  HENT:     Head: Normocephalic.  Eyes:     Extraocular Movements: Extraocular movements intact.     Pupils: Pupils are equal, round, and reactive to light.  Cardiovascular:     Rate and Rhythm: Normal rate and regular rhythm.     Pulses: Normal pulses.     Heart sounds: Normal heart sounds.  Pulmonary:     Effort: Pulmonary effort is normal.     Breath sounds: Normal breath sounds.  Musculoskeletal:  General: Normal range of motion.     Cervical back: Normal range of motion and neck supple.  Skin:    General: Skin is warm and dry.     Capillary Refill: Capillary refill takes less than 2 seconds.  Neurological:     General: No focal deficit present.     Mental Status: She is alert and oriented to person, place, and time.  Psychiatric:        Mood and Affect: Mood normal.        Behavior: Behavior normal.     Results for orders placed or performed in visit on 01/09/21 (from the past 24 hour(s))  POCT glycosylated hemoglobin (Hb A1C)     Status: Abnormal   Collection Time: 01/09/21  2:37 PM  Result Value Ref Range   Hemoglobin A1C 8.5 (A) 4.0 - 5.6 %   HbA1c POC (<> result, manual entry)     HbA1c, POC (prediabetic range)     HbA1c, POC (controlled diabetic range)      ASSESSMENT & PLAN: Obesity, diabetes, and hypertension syndrome ([place]) Well-controlled hypertension.  Continue amlodipine 5 mg daily and hydrochlorothiazide 25 mg daily. Uncontrolled diabetes with hemoglobin A1c worse than before at 8.5.  Patient noncompliant with medications and nutrition.  Presently only taking glipizide 5 mg in the morning.  Cardiovascular risks associated with uncontrolled diabetes discussed with patient.  Patient  has history of previous stroke. Diet and nutrition discussed with patient. Intolerant to metformin.  Needs to start Trulicity 1.5 mg weekly and continue glipizide 5 mg twice a day. Intolerant to statins due to myopathy.  We will start Zetia 10 mg daily. Follow-up in 3 months.  Madicyn was seen today for annual exam and spasms.  Diagnoses and all orders for this visit:  Encounter for general adult medical examination with abnormal findings  Hypertension associated with diabetes ([place]) -     Microalbumin, urine -     POCT glycosylated hemoglobin (Hb A1C) -     CBC with Differential/Platelet -     Comprehensive metabolic panel -     TSH -     Dulaglutide (TRULICITY) 1.5 CV/8.9FY SOPN; Inject 1.5 mg into the skin once a week.  Dyslipidemia -     Lipid panel -     ezetimibe (ZETIA) 10 MG tablet; Take 1 tablet (10 mg total) by mouth daily.  Morbid obesity ([place])  Encounter for screening mammogram for malignant neoplasm of breast -     MM Digital Screening; Future  Colon cancer screening -     Ambulatory referral to Gastroenterology  Noncompliance with diabetes treatment  OSA on CPAP  History of stroke  Obesity, diabetes, and hypertension syndrome ([facility])  Statin intolerance  Statin myopathy  Uncontrolled type 2 diabetes mellitus with hyperglycemia ([place])  Modifiable risk factors discussed with patient. Anticipatory guidance provided. The following topics were discussed: How to avoid future stroke Smoking Diet and nutrition and weight reduction Benefits of exercise Cancer screening and need for colonoscopy and or mammogram Vaccinations Cardiovascular risk assessment Mental health including depression and anxiety Fall and accident prevention Uncontrolled diabetes and cardiovascular risks associated with this condition.  Review of all medications and changes made. Need for follow-up in 3 months.   Patient Instructions   Diabetes Mellitus and Nutrition, Adult When  you have diabetes, or diabetes mellitus, it is very important to have healthy eating habits because your blood sugar (glucose) levels are greatly affected by what you eat and drink. Eating healthy foods  in the right amounts, at about the same times every day, can help you:  Control your blood glucose.  Lower your risk of heart disease.  Improve your blood pressure.  Reach or maintain a healthy weight. What can affect my meal plan? Every person with diabetes is different, and each person has different needs for a meal plan. Your health care provider may recommend that you work with a dietitian to make a meal plan that is best for you. Your meal plan may vary depending on factors such as:  The calories you need.  The medicines you take.  Your weight.  Your blood glucose, blood pressure, and cholesterol levels.  Your activity level.  Other health conditions you have, such as heart or kidney disease. How do carbohydrates affect me? Carbohydrates, also called carbs, affect your blood glucose level more than any other type of food. Eating carbs naturally raises the amount of glucose in your blood. Carb counting is a method for keeping track of how many carbs you eat. Counting carbs is important to keep your blood glucose at a healthy level, especially if you use insulin or take certain oral diabetes medicines. It is important to know how many carbs you can safely have in each meal. This is different for every person. Your dietitian can help you calculate how many carbs you should have at each meal and for each snack. How does alcohol affect me? Alcohol can cause a sudden decrease in blood glucose (hypoglycemia), especially if you use insulin or take certain oral diabetes medicines. Hypoglycemia can be a life-threatening condition. Symptoms of hypoglycemia, such as sleepiness, dizziness, and confusion, are similar to symptoms of having too much alcohol.  Do not drink alcohol if: ? Your health  care provider tells you not to drink. ? You are pregnant, may be pregnant, or are planning to become pregnant.  If you drink alcohol: ? Do not drink on an empty stomach. ? Limit how much you use to:  0-1 drink a day for women.  0-2 drinks a day for men. ? Be aware of how much alcohol is in your drink. In the [place] one drink equals one 12 oz bottle of beer (355 mL), one 5 oz glass of wine (148 mL), or one 1 oz glass of hard liquor (44 mL). ? Keep yourself hydrated with water, diet soda, or unsweetened iced tea.  Keep in mind that regular soda, juice, and other mixers may contain a lot of sugar and must be counted as carbs. What are tips for following this plan? Reading food labels  Start by checking the serving size on the "Nutrition Facts" label of packaged foods and drinks. The amount of calories, carbs, fats, and other nutrients listed on the label is based on one serving of the item. Many items contain more than one serving per package.  Check the total grams (g) of carbs in one serving. You can calculate the number of servings of carbs in one serving by dividing the total carbs by 15. For example, if a food has 30 g of total carbs per serving, it would be equal to 2 servings of carbs.  Check the number of grams (g) of saturated fats and trans fats in one serving. Choose foods that have a low amount or none of these fats.  Check the number of milligrams (mg) of salt (sodium) in one serving. Most people should limit total sodium intake to less than 2,300 mg per day.  Always check the  nutrition information of foods labeled as "low-fat" or "nonfat." These foods may be higher in added sugar or refined carbs and should be avoided.  Talk to your dietitian to identify your daily goals for nutrients listed on the label. Shopping  Avoid buying canned, pre-made, or processed foods. These foods tend to be high in fat, sodium, and added sugar.  Shop around the outside edge of the grocery  store. This is where you will most often find fresh fruits and vegetables, bulk grains, fresh meats, and fresh dairy. Cooking  Use low-heat cooking methods, such as baking, instead of high-heat cooking methods like deep frying.  Cook using healthy oils, such as olive, canola, or sunflower oil.  Avoid cooking with butter, cream, or high-fat meats. Meal planning  Eat meals and snacks regularly, preferably at the same times every day. Avoid going long periods of time without eating.  Eat foods that are high in fiber, such as fresh fruits, vegetables, beans, and whole grains. Talk with your dietitian about how many servings of carbs you can eat at each meal.  Eat 4-6 oz (112-168 g) of lean protein each day, such as lean meat, chicken, fish, eggs, or tofu. One ounce (oz) of lean protein is equal to: ? 1 oz (28 g) of meat, chicken, or fish. ? 1 egg. ?  cup (62 g) of tofu.  Eat some foods each day that contain healthy fats, such as avocado, nuts, seeds, and fish.   What foods should I eat? Fruits Berries. Apples. Oranges. Peaches. Apricots. Plums. Grapes. Mango. Papaya. Pomegranate. Kiwi. Cherries. Vegetables Lettuce. Spinach. Leafy greens, including kale, chard, collard greens, and mustard greens. Beets. Cauliflower. Cabbage. Broccoli. Carrots. Green beans. Tomatoes. Peppers. Onions. Cucumbers. Brussels sprouts. Grains Whole grains, such as whole-wheat or whole-grain bread, crackers, tortillas, cereal, and pasta. Unsweetened oatmeal. Quinoa. Brown or wild rice. Meats and other proteins Seafood. Poultry without skin. Lean cuts of poultry and beef. Tofu. Nuts. Seeds. Dairy Low-fat or fat-free dairy products such as milk, yogurt, and cheese. The items listed above may not be a complete list of foods and beverages you can eat. Contact a dietitian for more information. What foods should I avoid? Fruits Fruits canned with syrup. Vegetables Canned vegetables. Frozen vegetables with butter or  cream sauce. Grains Refined white flour and flour products such as bread, pasta, snack foods, and cereals. Avoid all processed foods. Meats and other proteins Fatty cuts of meat. Poultry with skin. Breaded or fried meats. Processed meat. Avoid saturated fats. Dairy Full-fat yogurt, cheese, or milk. Beverages Sweetened drinks, such as soda or iced tea. The items listed above may not be a complete list of foods and beverages you should avoid. Contact a dietitian for more information. Questions to ask a health care provider  Do I need to meet with a diabetes educator?  Do I need to meet with a dietitian?  What number can I call if I have questions?  When are the best times to check my blood glucose? Where to find more information:  American Diabetes Association: diabetes.org  Academy of Nutrition and Dietetics: www.eatright.[employer] of Diabetes and Digestive and Kidney Diseases: [employer]  Association of Diabetes Care and Education Specialists: www.diabeteseducator.org Summary  It is important to have healthy eating habits because your blood sugar (glucose) levels are greatly affected by what you eat and drink.  A healthy meal plan will help you control your blood glucose and maintain a healthy lifestyle.  Your health care provider may  recommend that you work with a dietitian to make a meal plan that is best for you.  Keep in mind that carbohydrates (carbs) and alcohol have immediate effects on your blood glucose levels. It is important to count carbs and to use alcohol carefully. This information is not intended to replace advice given to you by your health care provider. Make sure you discuss any questions you have with your health care provider. Document Revised: 08/03/2019 Document Reviewed: 08/03/2019 Elsevier Patient Education  2021 [place], MD [facility]

## 2021-01-09 NOTE — Assessment & Plan Note (Signed)
Well-controlled hypertension.  Continue amlodipine 5 mg daily and hydrochlorothiazide 25 mg daily. Uncontrolled diabetes with hemoglobin A1c worse than before at 8.5.  Patient noncompliant with medications and nutrition.  Presently only taking glipizide 5 mg in the morning.  Cardiovascular risks associated with uncontrolled diabetes discussed with patient.  Patient has history of previous stroke. Diet and nutrition discussed with patient. Intolerant to metformin.  Needs to start Trulicity 1.5 mg weekly and continue glipizide 5 mg twice a day. Intolerant to statins due to myopathy.  We will start Zetia 10 mg daily. Follow-up in 3 months.

## 2021-01-10 LAB — MICROALBUMIN, URINE: Microalb, Ur: 0.2 mg/dL

## 2021-01-16 ENCOUNTER — Telehealth: Payer: Self-pay | Admitting: Emergency Medicine

## 2021-01-16 NOTE — Telephone Encounter (Signed)
    Patient requesting call to discuss lab results. Patient also states pharmacy did not fill Dulaglutide (TRULICITY) 1.5 SH/6.8HF SOPN   Please call patient

## 2021-01-16 NOTE — Telephone Encounter (Signed)
  Pt labs are completed, but need to be reviewed by provider before release to pt.

## 2021-01-16 NOTE — Telephone Encounter (Signed)
Call patient please.  Labs show the following: Elevated glucose and hemoglobin A1c and elevated cholesterol.  Rest of her labs look normal. Plan stays the same.  She has to start Trulicity and continue taking glipizide 5 mg twice a day.  She was also started on Zetia for cholesterol. Not sure why Trulicity was not filled by her pharmacy.  Please find out.  Thanks.

## 2021-01-17 NOTE — Telephone Encounter (Signed)
Called pt, no answer. Left a VM to call back for lab results.

## 2021-01-19 NOTE — Telephone Encounter (Signed)
Called pt, no answer. Left a VM to call back for lab results. 

## 2021-01-25 ENCOUNTER — Other Ambulatory Visit: Payer: Self-pay | Admitting: Emergency Medicine

## 2021-01-29 ENCOUNTER — Telehealth: Payer: Self-pay | Admitting: Emergency Medicine

## 2021-01-29 NOTE — Telephone Encounter (Signed)
Patient called and was wondering if a referral for a mammogram could be placed. She can be reached at [phone]. Please advise

## 2021-02-01 NOTE — Addendum Note (Signed)
Addended by: Meredeth Ide on: 02/01/2021 04:26 PM   Modules accepted: Orders

## 2021-02-02 NOTE — Telephone Encounter (Signed)
Called pt, no answer. Left a VM to call back.

## 2021-02-06 ENCOUNTER — Other Ambulatory Visit: Payer: Self-pay | Admitting: Emergency Medicine

## 2021-02-06 DIAGNOSIS — I152 Hypertension secondary to endocrine disorders: Secondary | ICD-10-CM

## 2021-02-06 MED ORDER — OZEMPIC (0.25 OR 0.5 MG/DOSE) 2 MG/1.5ML ~~LOC~~ SOPN: 0.5000 mg | PEN_INJECTOR | SUBCUTANEOUS | 3 refills | Status: DC

## 2021-02-06 NOTE — Telephone Encounter (Signed)
We will try Ozempic instead.  New prescription sent.  Thanks.

## 2021-02-06 NOTE — Telephone Encounter (Signed)
Pt called back and was given her lab results and she wanted to inquire about a mammogram referral. Referral was placed 02/01/21, waiting for referral to reach out to pt to schedule appt. Pt also states that her insurance does not cover Trulicity, and it is too expensive to buy. She states she will think about changing insurance, but would like a different medication in place of Trulicity.

## 2021-02-06 NOTE — Telephone Encounter (Signed)
Pt called back and lab results was given.

## 2021-02-14 ENCOUNTER — Telehealth: Payer: Self-pay | Admitting: *Deleted

## 2021-02-14 NOTE — Telephone Encounter (Signed)
Spoke to patient to let her know Ozempic medication was approved.

## 2021-02-17 ENCOUNTER — Ambulatory Visit
Admission: RE | Admit: 2021-02-17 | Discharge: 2021-02-17 | Disposition: A | Discharge status: Home / Self Care | Payer: 59 | Source: Ambulatory Visit | Attending: Emergency Medicine | Admitting: Emergency Medicine

## 2021-02-17 DIAGNOSIS — Z1231 Encounter for screening mammogram for malignant neoplasm of breast: Secondary | ICD-10-CM

## 2021-02-19 NOTE — Telephone Encounter (Signed)
Patient states the Ozempic is the same price as the Trulicity even with her insurance. Cannot afford that medication.   Please advise and call back on cell #

## 2021-02-20 NOTE — Telephone Encounter (Signed)
Called and left a vm to call back

## 2021-02-20 NOTE — Telephone Encounter (Signed)
Patient returning call, please call back.

## 2021-02-21 NOTE — Telephone Encounter (Signed)
Spoke to patient she states Trulicity and Ozempic cost the same per the insurance. Patient wants to take the Ozempic, and patient stop taking Zetia because it made her body/joints hurt.

## 2021-02-22 NOTE — Progress Notes (Signed)
Call patient please.  Normal mammogram.  Thanks.

## 2021-03-01 ENCOUNTER — Other Ambulatory Visit: Payer: Self-pay | Admitting: Emergency Medicine

## 2021-03-01 DIAGNOSIS — E1159 Type 2 diabetes mellitus with other circulatory complications: Secondary | ICD-10-CM

## 2021-03-01 DIAGNOSIS — I152 Hypertension secondary to endocrine disorders: Secondary | ICD-10-CM

## 2021-03-14 ENCOUNTER — Telehealth: Payer: Self-pay | Admitting: Emergency Medicine

## 2021-03-14 NOTE — Telephone Encounter (Signed)
Suggest to take it back to the pharmacy and consult pharmacist regarding this and also proper use of Ozempic injection.  Thanks.

## 2021-03-14 NOTE — Telephone Encounter (Signed)
Patient has called the office stating that the needle for her Ozempic injection is not working properly. She was supposed to complete the injection yesterday, 7.5.22 but was not able to complete it.  Please advise. [phone]

## 2021-03-21 NOTE — Telephone Encounter (Signed)
Patient is returning call, and has questions about visit results. Please call the patient

## 2021-03-21 NOTE — Telephone Encounter (Signed)
Called and left a vm with instructions form provider.

## 2021-03-26 NOTE — Telephone Encounter (Signed)
Called pt, no answer. Left a VM about a referral to [facility].

## 2021-03-27 NOTE — Telephone Encounter (Signed)
Patient is returning the call, nurse was a little busy. Patient asked for a return phone call.   [phone]

## 2021-04-02 NOTE — Telephone Encounter (Signed)
Called pt no answer °

## 2021-04-03 ENCOUNTER — Telehealth: Payer: Self-pay

## 2021-04-03 NOTE — Telephone Encounter (Signed)
Pt called back in regards to a referral to GI, pt has not been contacted to schedule colon cancer screen. Referral was placed in 01/09/21. Pt given [facility] contact information to schedule an appointment.

## 2021-04-04 ENCOUNTER — Encounter: Payer: Self-pay | Admitting: Gastroenterology

## 2021-04-11 ENCOUNTER — Ambulatory Visit: Payer: 59 | Admitting: Emergency Medicine

## 2021-04-19 ENCOUNTER — Other Ambulatory Visit: Payer: Self-pay

## 2021-04-19 ENCOUNTER — Ambulatory Visit (INDEPENDENT_AMBULATORY_CARE_PROVIDER_SITE_OTHER): Payer: 59 | Admitting: Emergency Medicine

## 2021-04-19 ENCOUNTER — Encounter: Payer: Self-pay | Admitting: Emergency Medicine

## 2021-04-19 VITALS — BP 136/82 | HR 91 | Temp 98.5°F | Ht 66.0 in | Wt 245.0 lb

## 2021-04-19 DIAGNOSIS — E1169 Type 2 diabetes mellitus with other specified complication: Secondary | ICD-10-CM | POA: Diagnosis not present

## 2021-04-19 DIAGNOSIS — I152 Hypertension secondary to endocrine disorders: Secondary | ICD-10-CM

## 2021-04-19 DIAGNOSIS — E669 Obesity, unspecified: Secondary | ICD-10-CM

## 2021-04-19 DIAGNOSIS — Z789 Other specified health status: Secondary | ICD-10-CM | POA: Diagnosis not present

## 2021-04-19 DIAGNOSIS — Z1211 Encounter for screening for malignant neoplasm of colon: Secondary | ICD-10-CM

## 2021-04-19 DIAGNOSIS — G720 Drug-induced myopathy: Secondary | ICD-10-CM

## 2021-04-19 DIAGNOSIS — E1159 Type 2 diabetes mellitus with other circulatory complications: Secondary | ICD-10-CM | POA: Diagnosis not present

## 2021-04-19 DIAGNOSIS — Z6839 Body mass index (BMI) 39.0-39.9, adult: Secondary | ICD-10-CM

## 2021-04-19 DIAGNOSIS — T466X5A Adverse effect of antihyperlipidemic and antiarteriosclerotic drugs, initial encounter: Secondary | ICD-10-CM

## 2021-04-19 LAB — POCT GLYCOSYLATED HEMOGLOBIN (HGB A1C): Hemoglobin A1C: 7.2 % — AB (ref 4.0–5.6)

## 2021-04-19 NOTE — Progress Notes (Signed)
Lindsay Travis 60 y.o.   Chief Complaint  Patient presents with   Diabetes    Follow up on diabetes and BP   Assessment and plan from last office visit as follows: ASSESSMENT & PLAN: Obesity, diabetes, and hypertension syndrome ([place]) Well-controlled hypertension.  Continue amlodipine 5 mg daily and hydrochlorothiazide 25 mg daily. Uncontrolled diabetes with hemoglobin A1c worse than before at 8.5.  Patient noncompliant with medications and nutrition.  Presently only taking glipizide 5 mg in the morning.  Cardiovascular risks associated with uncontrolled diabetes discussed with patient.  Patient has history of previous stroke. Diet and nutrition discussed with patient. Intolerant to metformin.  Needs to start Trulicity 1.5 mg weekly and continue glipizide 5 mg twice a day. Intolerant to statins due to myopathy.  We will start Zetia 10 mg daily. Follow-up in 3 months. HISTORY OF PRESENT ILLNESS: This is a 60 y.o. female with history of hypertension and diabetes here for follow-up. #1 hypertension: On amlodipine 5 mg daily #2 diabetes: On Ozempic 0.5 mg daily and glipizide 5 mg twice a day. #3 dyslipidemia: On Zetia 10 mg daily.  Statin intolerant Today complaining of constipation Also complaining of skin lesions.  Needs dermatology referral.  Diabetes Pertinent negatives for hypoglycemia include no dizziness or headaches. Pertinent negatives for diabetes include no chest pain.    Prior to Admission medications   Medication Sig Start Date End Date Taking? Authorizing Provider  amLODipine (NORVASC) 5 MG tablet TAKE 1 TABLET(5 MG) BY MOUTH DAILY 03/01/21  Yes Lariyah Shetterly, Ines Bloomer, MD  aspirin 81 MG EC tablet Take 1 tablet (81 mg total) by mouth daily. 03/20/18  Yes McCue, [name], NP  hydrochlorothiazide (HYDRODIURIL) 25 MG tablet TAKE 1 TABLET(25 MG) BY MOUTH DAILY 01/25/21  Yes Amonte Brookover, Ines Bloomer, MD  Omega-3 Fatty Acids (OMEGA-3 FISH OIL PO) Take by mouth.   Yes [provider]  Semaglutide,0.25 or 0.5MG/DOS, (OZEMPIC, 0.25 OR 0.5 MG/DOSE,) 2 MG/1.5ML SOPN Inject 0.5 mg into the skin once a week. 02/06/21  Yes Sulaiman Imbert, Ines Bloomer, MD  blood glucose meter kit and supplies Per insurance preference. Check daily fasting blood glucose. (Dx. E11.9). Patient not taking: Reported on 04/19/2021 02/22/20   Jacelyn Pi, Lilia Argue, MD  ezetimibe (ZETIA) 10 MG tablet Take 1 tablet (10 mg total) by mouth daily. Patient not taking: Reported on 04/19/2021 01/09/21   Horald Pollen, MD  glipiZIDE (GLUCOTROL) 5 MG tablet Take 1 tablet (5 mg total) by mouth 2 (two) times daily with a meal. 02/16/20 05/16/20  Lakechia Nay, Ines Bloomer, MD  gabapentin (NEURONTIN) 300 MG capsule Take 300 mg by mouth 3 (three) times daily. 1 cap 1st week, 2 caps 2 wks, 3 caps 3 wks, continue. Different Provider.  10/04/19  [provider]    Allergies  Allergen Reactions   Lisinopril Other (See Comments)    Did not feel good   Losartan Other (See Comments)    headache   Aspirin Other (See Comments)    Abdominal pain only 325 mg     Patient Active Problem List   Diagnosis Date Noted   Obesity, diabetes, and hypertension syndrome ([place]) 01/09/2021   Morbid obesity ([place]) 09/18/2019   Cerebral thrombosis with cerebral infarction 01/10/2018   Dyslipidemia    Type 2 diabetes mellitus with hyperglycemia, without long-term current use of insulin ([place]) 08/07/2015   Varicose veins of lower extremities with other complications 69/67/8938   Fibroid, uterus 04/18/2011   Hypertension associated with diabetes ([place]) 04/18/2011  Past Medical History:  Diagnosis Date   Allergy    Anemia    Clotting disorder ([place])    Diabetes mellitus    recent high blood sugar and has RX but not taking  med -b/c she doesn't think she  is diabetic, just had lots of sugar in her diet when  dx'd.   Hypertension    Shortness of breath    on exertion- has low hgb    Past Surgical History:  Procedure  Laterality Date   ABDOMINAL HYSTERECTOMY  09/11/2011   Procedure: HYSTERECTOMY ABDOMINAL;  Surgeon: Frederico Hamman, MD;  Location: [facility];  Service: Gynecology;  Laterality: N/A;    Social History   Socioeconomic History   Marital status: Divorced    Spouse name: Not on file   Number of children: Not on file   Years of education: Not on file   Highest education level: Not on file  Occupational History   Not on file  Tobacco Use   Smoking status: Never   Smokeless tobacco: Never  Substance and Sexual Activity   Alcohol use: No   Drug use: No   Sexual activity: Never  Other Topics Concern   Not on file  Social History Narrative   Not on file   Social Determinants of Health   Financial Resource Strain: Not on file  Food Insecurity: Not on file  Transportation Needs: Not on file  Physical Activity: Not on file  Stress: Not on file  Social Connections: Not on file  Intimate Partner Violence: Not on file    Family History  Problem Relation Age of Onset   Hypertension Other      Review of Systems  Constitutional: Negative.  Negative for chills and fever.  HENT: Negative.  Negative for congestion and sore throat.   Respiratory: Negative.  Negative for cough and shortness of breath.   Cardiovascular: Negative.  Negative for chest pain and palpitations.  Gastrointestinal: Negative.  Negative for abdominal pain, diarrhea, nausea and vomiting.  Genitourinary: Negative.  Negative for dysuria and hematuria.  Skin: Negative.  Negative for rash.  Neurological:  Negative for dizziness and headaches.  All other systems reviewed and are negative.  Today's Vitals   04/19/21 1317  BP: 136/82  Pulse: 91  Temp: 98.5 F (36.9 C)  TempSrc: Oral  SpO2: 98%  Weight: 245 lb (111.1 kg)  Height: '5\' 6"'  (1.676 m)   Body mass index is 39.54 kg/m. Wt Readings from Last 3 Encounters:  04/19/21 245 lb (111.1 kg)  01/09/21 243 lb 9.6 oz (110.5 kg)  02/16/20 244 lb (110.7 kg)     Physical Exam Vitals reviewed.  Constitutional:      Appearance: Normal appearance. She is obese.  HENT:     Head: Normocephalic.  Eyes:     Extraocular Movements: Extraocular movements intact.     Conjunctiva/sclera: Conjunctivae normal.     Pupils: Pupils are equal, round, and reactive to light.  Cardiovascular:     Rate and Rhythm: Normal rate and regular rhythm.     Pulses: Normal pulses.     Heart sounds: Normal heart sounds.  Pulmonary:     Effort: Pulmonary effort is normal.     Breath sounds: Normal breath sounds.  Musculoskeletal:        General: Normal range of motion.     Cervical back: Normal range of motion and neck supple.  Skin:    General: Skin is warm and dry.  Capillary Refill: Capillary refill takes less than 2 seconds.  Neurological:     General: No focal deficit present.     Mental Status: She is alert and oriented to person, place, and time.  Psychiatric:        Mood and Affect: Mood normal.        Behavior: Behavior normal.   Results for orders placed or performed in visit on 04/19/21 (from the past 24 hour(s))  POCT glycosylated hemoglobin (Hb A1C)     Status: Abnormal   Collection Time: 04/19/21  1:31 PM  Result Value Ref Range   Hemoglobin A1C 7.2 (A) 4.0 - 5.6 %   HbA1c POC (<> result, manual entry)     HbA1c, POC (prediabetic range)     HbA1c, POC (controlled diabetic range)       ASSESSMENT & PLAN: Hypertension associated with diabetes ([place]) Uncontrolled hypertension.  Continue amlodipine 5 mg daily. Much improved diabetes with hemoglobin A1c better than before at 7.2.  Continue weekly Ozempic 0.5 mg and glipizide 5 mg twice a day. Diet and nutrition discussed. Continue Zetia 10 mg daily.  Rica was seen today for diabetes.  Diagnoses and all orders for this visit:  Obesity, diabetes, and hypertension syndrome ([place])  Hypertension associated with diabetes ([place]) -     POCT glycosylated hemoglobin (Hb A1C)  Statin  intolerance  Statin myopathy  Body mass index (BMI) of 39.0-39.9 in adult  Colon cancer screening -     Cologuard    Patient Instructions  Continue present medications no changes. Are taking Metamucil daily to relieve constipation. Follow-up in 3 months. Diabetes Mellitus and Nutrition, Adult When you have diabetes, or diabetes mellitus, it is very important to have healthy eating habits because your blood sugar (glucose) levels are greatly affected by what you eat and drink. Eating healthy foods in the right amounts, at about the same times every day, can help you: Control your blood glucose. Lower your risk of heart disease. Improve your blood pressure. Reach or maintain a healthy weight. What can affect my meal plan? Every person with diabetes is different, and each person has different needs for a meal plan. Your health care provider may recommend that you work with a dietitian to make a meal plan that is best for you. Your meal plan may vary depending on factors such as: The calories you need. The medicines you take. Your weight. Your blood glucose, blood pressure, and cholesterol levels. Your activity level. Other health conditions you have, such as heart or kidney disease. How do carbohydrates affect me? Carbohydrates, also called carbs, affect your blood glucose level more than any other type of food. Eating carbs naturally raises the amount of glucose in your blood. Carb counting is a method for keeping track of how many carbs you eat. Counting carbs is important to keep your blood glucose at a healthy level,especially if you use insulin or take certain oral diabetes medicines. It is important to know how many carbs you can safely have in each meal. This is different for every person. Your dietitian can help you calculate how manycarbs you should have at each meal and for each snack. How does alcohol affect me? Alcohol can cause a sudden decrease in blood glucose  (hypoglycemia), especially if you use insulin or take certain oral diabetes medicines. Hypoglycemia can be a life-threatening condition. Symptoms of hypoglycemia, such as sleepiness, dizziness, and confusion, are similar to symptoms of having too much alcohol. Do not drink alcohol  if: Your health care provider tells you not to drink. You are pregnant, may be pregnant, or are planning to become pregnant. If you drink alcohol: Do not drink on an empty stomach. Limit how much you use to: 0-1 drink a day for women. 0-2 drinks a day for men. Be aware of how much alcohol is in your drink. In the [place] one drink equals one 12 oz bottle of beer (355 mL), one 5 oz glass of wine (148 mL), or one 1 oz glass of hard liquor (44 mL). Keep yourself hydrated with water, diet soda, or unsweetened iced tea. Keep in mind that regular soda, juice, and other mixers may contain a lot of sugar and must be counted as carbs. What are tips for following this plan?  Reading food labels Start by checking the serving size on the "Nutrition Facts" label of packaged foods and drinks. The amount of calories, carbs, fats, and other nutrients listed on the label is based on one serving of the item. Many items contain more than one serving per package. Check the total grams (g) of carbs in one serving. You can calculate the number of servings of carbs in one serving by dividing the total carbs by 15. For example, if a food has 30 g of total carbs per serving, it would be equal to 2 servings of carbs. Check the number of grams (g) of saturated fats and trans fats in one serving. Choose foods that have a low amount or none of these fats. Check the number of milligrams (mg) of salt (sodium) in one serving. Most people should limit total sodium intake to less than 2,300 mg per day. Always check the nutrition information of foods labeled as "low-fat" or "nonfat." These foods may be higher in added sugar or refined carbs and should be  avoided. Talk to your dietitian to identify your daily goals for nutrients listed on the label. Shopping Avoid buying canned, pre-made, or processed foods. These foods tend to be high in fat, sodium, and added sugar. Shop around the outside edge of the grocery store. This is where you will most often find fresh fruits and vegetables, bulk grains, fresh meats, and fresh dairy. Cooking Use low-heat cooking methods, such as baking, instead of high-heat cooking methods like deep frying. Cook using healthy oils, such as olive, canola, or sunflower oil. Avoid cooking with butter, cream, or high-fat meats. Meal planning Eat meals and snacks regularly, preferably at the same times every day. Avoid going long periods of time without eating. Eat foods that are high in fiber, such as fresh fruits, vegetables, beans, and whole grains. Talk with your dietitian about how many servings of carbs you can eat at each meal. Eat 4-6 oz (112-168 g) of lean protein each day, such as lean meat, chicken, fish, eggs, or tofu. One ounce (oz) of lean protein is equal to: 1 oz (28 g) of meat, chicken, or fish. 1 egg.  cup (62 g) of tofu. Eat some foods each day that contain healthy fats, such as avocado, nuts, seeds, and fish. What foods should I eat? Fruits Berries. Apples. Oranges. Peaches. Apricots. Plums. Grapes. Mango. Papaya.Pomegranate. Kiwi. Cherries. Vegetables Lettuce. Spinach. Leafy greens, including kale, chard, collard greens, and mustard greens. Beets. Cauliflower. Cabbage. Broccoli. Carrots. Green beans.Tomatoes. Peppers. Onions. Cucumbers. Brussels sprouts. Grains Whole grains, such as whole-wheat or whole-grain bread, crackers, tortillas,cereal, and pasta. Unsweetened oatmeal. Quinoa. Brown or wild rice. Meats and other proteins Seafood. Poultry without skin. Lean cuts  of poultry and beef. Tofu. Nuts. Seeds. Dairy Low-fat or fat-free dairy products such as milk, yogurt, and cheese. The items listed  above may not be a complete list of foods and beverages you can eat. Contact a dietitian for more information. What foods should I avoid? Fruits Fruits canned with syrup. Vegetables Canned vegetables. Frozen vegetables with butter or cream sauce. Grains Refined white flour and flour products such as bread, pasta, snack foods, andcereals. Avoid all processed foods. Meats and other proteins Fatty cuts of meat. Poultry with skin. Breaded or fried meats. Processed meat.Avoid saturated fats. Dairy Full-fat yogurt, cheese, or milk. Beverages Sweetened drinks, such as soda or iced tea. The items listed above may not be a complete list of foods and beverages you should avoid. Contact a dietitian for more information. Questions to ask a health care provider Do I need to meet with a diabetes educator? Do I need to meet with a dietitian? What number can I call if I have questions? When are the best times to check my blood glucose? Where to find more information: American Diabetes Association: diabetes.org Academy of Nutrition and Dietetics: www.eatright.[employer] of Diabetes and Digestive and Kidney Diseases: [place] Association of Diabetes Care and Education Specialists: www.diabeteseducator.org Summary It is important to have healthy eating habits because your blood sugar (glucose) levels are greatly affected by what you eat and drink. A healthy meal plan will help you control your blood glucose and maintain a healthy lifestyle. Your health care provider may recommend that you work with a dietitian to make a meal plan that is best for you. Keep in mind that carbohydrates (carbs) and alcohol have immediate effects on your blood glucose levels. It is important to count carbs and to use alcohol carefully. This information is not intended to replace advice given to you by your health care provider. Make sure you discuss any questions you have with your healthcare  provider. Document Revised: 08/03/2019 Document Reviewed: 08/03/2019 Elsevier Patient Education  2021 [place], MD [facility]

## 2021-04-19 NOTE — Assessment & Plan Note (Signed)
Uncontrolled hypertension.  Continue amlodipine 5 mg daily. Much improved diabetes with hemoglobin A1c better than before at 7.2.  Continue weekly Ozempic 0.5 mg and glipizide 5 mg twice a day. Diet and nutrition discussed. Continue Zetia 10 mg daily.

## 2021-04-19 NOTE — Patient Instructions (Signed)
Continue present medications no changes. Are taking Metamucil daily to relieve constipation. Follow-up in 3 months. Diabetes Mellitus and Nutrition, Adult When you have diabetes, or diabetes mellitus, it is very important to have healthy eating habits because your blood sugar (glucose) levels are greatly affected by what you eat and drink. Eating healthy foods in the right amounts, at about the same times every day, can help you: Control your blood glucose. Lower your risk of heart disease. Improve your blood pressure. Reach or maintain a healthy weight. What can affect my meal plan? Every person with diabetes is different, and each person has different needs for a meal plan. Your health care provider may recommend that you work with a dietitian to make a meal plan that is best for you. Your meal plan may vary depending on factors such as: The calories you need. The medicines you take. Your weight. Your blood glucose, blood pressure, and cholesterol levels. Your activity level. Other health conditions you have, such as heart or kidney disease. How do carbohydrates affect me? Carbohydrates, also called carbs, affect your blood glucose level more than any other type of food. Eating carbs naturally raises the amount of glucose in your blood. Carb counting is a method for keeping track of how many carbs you eat. Counting carbs is important to keep your blood glucose at a healthy level,especially if you use insulin or take certain oral diabetes medicines. It is important to know how many carbs you can safely have in each meal. This is different for every person. Your dietitian can help you calculate how manycarbs you should have at each meal and for each snack. How does alcohol affect me? Alcohol can cause a sudden decrease in blood glucose (hypoglycemia), especially if you use insulin or take certain oral diabetes medicines. Hypoglycemia can be a life-threatening condition. Symptoms of hypoglycemia,  such as sleepiness, dizziness, and confusion, are similar to symptoms of having too much alcohol. Do not drink alcohol if: Your health care provider tells you not to drink. You are pregnant, may be pregnant, or are planning to become pregnant. If you drink alcohol: Do not drink on an empty stomach. Limit how much you use to: 0-1 drink a day for women. 0-2 drinks a day for men. Be aware of how much alcohol is in your drink. In the [place] one drink equals one 12 oz bottle of beer (355 mL), one 5 oz glass of wine (148 mL), or one 1 oz glass of hard liquor (44 mL). Keep yourself hydrated with water, diet soda, or unsweetened iced tea. Keep in mind that regular soda, juice, and other mixers may contain a lot of sugar and must be counted as carbs. What are tips for following this plan?  Reading food labels Start by checking the serving size on the "Nutrition Facts" label of packaged foods and drinks. The amount of calories, carbs, fats, and other nutrients listed on the label is based on one serving of the item. Many items contain more than one serving per package. Check the total grams (g) of carbs in one serving. You can calculate the number of servings of carbs in one serving by dividing the total carbs by 15. For example, if a food has 30 g of total carbs per serving, it would be equal to 2 servings of carbs. Check the number of grams (g) of saturated fats and trans fats in one serving. Choose foods that have a low amount or none of these fats. Check  the number of milligrams (mg) of salt (sodium) in one serving. Most people should limit total sodium intake to less than 2,300 mg per day. Always check the nutrition information of foods labeled as "low-fat" or "nonfat." These foods may be higher in added sugar or refined carbs and should be avoided. Talk to your dietitian to identify your daily goals for nutrients listed on the label. Shopping Avoid buying canned, pre-made, or processed foods. These  foods tend to be high in fat, sodium, and added sugar. Shop around the outside edge of the grocery store. This is where you will most often find fresh fruits and vegetables, bulk grains, fresh meats, and fresh dairy. Cooking Use low-heat cooking methods, such as baking, instead of high-heat cooking methods like deep frying. Cook using healthy oils, such as olive, canola, or sunflower oil. Avoid cooking with butter, cream, or high-fat meats. Meal planning Eat meals and snacks regularly, preferably at the same times every day. Avoid going long periods of time without eating. Eat foods that are high in fiber, such as fresh fruits, vegetables, beans, and whole grains. Talk with your dietitian about how many servings of carbs you can eat at each meal. Eat 4-6 oz (112-168 g) of lean protein each day, such as lean meat, chicken, fish, eggs, or tofu. One ounce (oz) of lean protein is equal to: 1 oz (28 g) of meat, chicken, or fish. 1 egg.  cup (62 g) of tofu. Eat some foods each day that contain healthy fats, such as avocado, nuts, seeds, and fish. What foods should I eat? Fruits Berries. Apples. Oranges. Peaches. Apricots. Plums. Grapes. Mango. Papaya.Pomegranate. Kiwi. Cherries. Vegetables Lettuce. Spinach. Leafy greens, including kale, chard, collard greens, and mustard greens. Beets. Cauliflower. Cabbage. Broccoli. Carrots. Green beans.Tomatoes. Peppers. Onions. Cucumbers. Brussels sprouts. Grains Whole grains, such as whole-wheat or whole-grain bread, crackers, tortillas,cereal, and pasta. Unsweetened oatmeal. Quinoa. Brown or wild rice. Meats and other proteins Seafood. Poultry without skin. Lean cuts of poultry and beef. Tofu. Nuts. Seeds. Dairy Low-fat or fat-free dairy products such as milk, yogurt, and cheese. The items listed above may not be a complete list of foods and beverages you can eat. Contact a dietitian for more information. What foods should I avoid? Fruits Fruits canned  with syrup. Vegetables Canned vegetables. Frozen vegetables with butter or cream sauce. Grains Refined white flour and flour products such as bread, pasta, snack foods, andcereals. Avoid all processed foods. Meats and other proteins Fatty cuts of meat. Poultry with skin. Breaded or fried meats. Processed meat.Avoid saturated fats. Dairy Full-fat yogurt, cheese, or milk. Beverages Sweetened drinks, such as soda or iced tea. The items listed above may not be a complete list of foods and beverages you should avoid. Contact a dietitian for more information. Questions to ask a health care provider Do I need to meet with a diabetes educator? Do I need to meet with a dietitian? What number can I call if I have questions? When are the best times to check my blood glucose? Where to find more information: American Diabetes Association: diabetes.org Academy of Nutrition and Dietetics: www.eatright.[employer] of Diabetes and Digestive and Kidney Diseases: [place] Association of Diabetes Care and Education Specialists: www.diabeteseducator.org Summary It is important to have healthy eating habits because your blood sugar (glucose) levels are greatly affected by what you eat and drink. A healthy meal plan will help you control your blood glucose and maintain a healthy lifestyle. Your health care provider may recommend that you  work with a dietitian to make a meal plan that is best for you. Keep in mind that carbohydrates (carbs) and alcohol have immediate effects on your blood glucose levels. It is important to count carbs and to use alcohol carefully. This information is not intended to replace advice given to you by your health care provider. Make sure you discuss any questions you have with your healthcare provider. Document Revised: 08/03/2019 Document Reviewed: 08/03/2019 Elsevier Patient Education  2021 [employer].

## 2021-04-23 ENCOUNTER — Other Ambulatory Visit: Payer: Self-pay | Admitting: Emergency Medicine

## 2021-04-23 DIAGNOSIS — I152 Hypertension secondary to endocrine disorders: Secondary | ICD-10-CM

## 2021-04-23 DIAGNOSIS — E1159 Type 2 diabetes mellitus with other circulatory complications: Secondary | ICD-10-CM

## 2021-05-17 ENCOUNTER — Telehealth: Payer: Self-pay

## 2021-05-17 ENCOUNTER — Other Ambulatory Visit: Payer: Self-pay

## 2021-05-17 DIAGNOSIS — Z1211 Encounter for screening for malignant neoplasm of colon: Secondary | ICD-10-CM

## 2021-05-17 NOTE — Telephone Encounter (Signed)
Hello Lindsay Travis  Per [name], pt needs to be scheduled with [facility] d/t being a difficult intubation.  Please call and schedule  a time that will fit the pt's schedule.  Thank you so much.

## 2021-05-17 NOTE — Telephone Encounter (Signed)
Morning Lindsay Travis:  During chart work up noted the below--please advise.  Procedure Name: Intubation Date/Time: 09/11/2011 7:46 AM Performed by: Katherina Mires Pre-anesthesia Checklist: Suction available, Emergency Drugs available, Timeout performed, Patient identified and Patient being monitored Patient Re-evaluated:Patient Re-evaluated prior to inductionOxygen Delivery Method: Circle System Utilized Preoxygenation: Pre-oxygenation with 100% oxygen Intubation Type: IV induction Ventilation: Mask ventilation without difficulty Grade View: Grade I Tube type: Oral Tube size: 7.0 mm Number of attempts: 1 Airway Equipment and Method: patient positioned with wedge pillow,  stylet and video-laryngoscopy Placement Confirmation: ETT inserted through vocal cords under direct vision,  positive ETCO2 and breath sounds checked- equal and bilateral Secured at: 21 cm Tube secured with: Tape Dental Injury: Teeth and Oropharynx as per pre-operative assessment  Difficulty Due To: Difficulty was anticipated

## 2021-05-17 NOTE — Telephone Encounter (Signed)
Pt scheduled at [facility] with Dr. Leroy Kennedy on 07/16/2021 at 8:30. Case ID number [id number]. Pt made aware

## 2021-05-28 ENCOUNTER — Other Ambulatory Visit: Payer: Self-pay

## 2021-05-28 ENCOUNTER — Ambulatory Visit (AMBULATORY_SURGERY_CENTER): Payer: 59

## 2021-05-28 VITALS — Ht 66.0 in | Wt 240.0 lb

## 2021-05-28 DIAGNOSIS — Z1211 Encounter for screening for malignant neoplasm of colon: Secondary | ICD-10-CM

## 2021-05-28 MED ORDER — NA SULFATE-K SULFATE-MG SULF 17.5-3.13-1.6 GM/177ML PO SOLN: 1.0000 | Freq: Once | ORAL | 0 refills | Status: AC

## 2021-05-28 NOTE — Progress Notes (Signed)
Blessing, patient's daughter, also on call with patient; patient reports she speaks [place], from [place]; patient's daughter reports she is bringing the patient to the procedure;  Pre visit completed via phone call; Patient verified name, DOB, and address;  No egg or soy allergy known to patient  No issues known to pt with past sedation with any surgeries or procedures Patient denies ever being told they had issues or difficulty with intubation  No FH of Malignant Hyperthermia Pt is not on diet pills Pt is not on  home 02  Pt is not on blood thinners  Pt reports issues with constipation - pt reports she takes tea/milk of magnesium for constipation relief No A fib or A flutter  COVID 19 guidelines implemented in PV today with Pt and RN  NO PA's for preps discussed with pt in PV today  Discussed with pt there will be an out-of-pocket cost for prep and that varies from $0 to 70 +  dollars  Due to the COVID-19 pandemic we are asking patients to follow certain guidelines.  Pt aware of COVID protocols and [facility] guidelines

## 2021-06-11 ENCOUNTER — Encounter: Payer: No Typology Code available for payment source | Admitting: Gastroenterology

## 2021-07-05 ENCOUNTER — Other Ambulatory Visit: Payer: Self-pay | Admitting: Emergency Medicine

## 2021-07-05 ENCOUNTER — Encounter (HOSPITAL_COMMUNITY): Payer: Self-pay | Admitting: Internal Medicine

## 2021-07-05 DIAGNOSIS — I152 Hypertension secondary to endocrine disorders: Secondary | ICD-10-CM

## 2021-07-05 DIAGNOSIS — E1159 Type 2 diabetes mellitus with other circulatory complications: Secondary | ICD-10-CM

## 2021-07-05 NOTE — Progress Notes (Signed)
Attempted to obtain medical history via telephone, unable to reach at this time. I left a voicemail to return pre surgical testing department's phone call.  

## 2021-07-10 ENCOUNTER — Telehealth: Payer: Self-pay | Admitting: Internal Medicine

## 2021-07-10 DIAGNOSIS — Z86010 Personal history of colonic polyps: Secondary | ICD-10-CM

## 2021-07-10 DIAGNOSIS — Z1211 Encounter for screening for malignant neoplasm of colon: Secondary | ICD-10-CM

## 2021-07-10 DIAGNOSIS — Z860101 Personal history of adenomatous and serrated colon polyps: Secondary | ICD-10-CM

## 2021-07-10 HISTORY — DX: Personal history of colonic polyps: Z86.010

## 2021-07-10 HISTORY — DX: Personal history of adenomatous and serrated colon polyps: Z86.0101

## 2021-07-10 MED ORDER — NA SULFATE-K SULFATE-MG SULF 17.5-3.13-1.6 GM/177ML PO SOLN: 1.0000 | ORAL | 0 refills | Status: DC

## 2021-07-10 NOTE — Telephone Encounter (Signed)
Suprep Rx sent to pharmacy per request.

## 2021-07-10 NOTE — Telephone Encounter (Signed)
Inbound call from patient. States pharmacy states they did not receive her Suprep for upcoming procedure 11/7. Sent to [facility]

## 2021-07-16 ENCOUNTER — Ambulatory Visit (HOSPITAL_COMMUNITY): Payer: 59

## 2021-07-16 ENCOUNTER — Ambulatory Visit (HOSPITAL_COMMUNITY)
Admission: RE | Admit: 2021-07-16 | Discharge: 2021-07-16 | Disposition: A | Discharge status: Home / Self Care | Payer: 59 | Attending: Internal Medicine | Admitting: Internal Medicine

## 2021-07-16 ENCOUNTER — Ambulatory Visit (HOSPITAL_COMMUNITY): Payer: 59 | Admitting: Anesthesiology

## 2021-07-16 ENCOUNTER — Encounter (HOSPITAL_COMMUNITY): Payer: Self-pay | Admitting: Internal Medicine

## 2021-07-16 ENCOUNTER — Encounter (HOSPITAL_COMMUNITY)
Admission: RE | Disposition: A | Discharge status: Home / Self Care | Payer: Self-pay | Source: Home / Self Care | Attending: Internal Medicine

## 2021-07-16 ENCOUNTER — Other Ambulatory Visit: Payer: Self-pay

## 2021-07-16 DIAGNOSIS — Z1211 Encounter for screening for malignant neoplasm of colon: Secondary | ICD-10-CM | POA: Insufficient documentation

## 2021-07-16 DIAGNOSIS — K5730 Diverticulosis of large intestine without perforation or abscess without bleeding: Secondary | ICD-10-CM | POA: Insufficient documentation

## 2021-07-16 DIAGNOSIS — D120 Benign neoplasm of cecum: Secondary | ICD-10-CM | POA: Diagnosis not present

## 2021-07-16 DIAGNOSIS — Q438 Other specified congenital malformations of intestine: Secondary | ICD-10-CM | POA: Insufficient documentation

## 2021-07-16 DIAGNOSIS — R1013 Epigastric pain: Secondary | ICD-10-CM

## 2021-07-16 DIAGNOSIS — R0600 Dyspnea, unspecified: Secondary | ICD-10-CM

## 2021-07-16 HISTORY — PX: POLYPECTOMY: SHX5525

## 2021-07-16 HISTORY — PX: COLONOSCOPY WITH PROPOFOL: SHX5780

## 2021-07-16 LAB — GLUCOSE, CAPILLARY: Glucose-Capillary: 123 mg/dL — ABNORMAL HIGH (ref 70–99)

## 2021-07-16 SURGERY — COLONOSCOPY WITH PROPOFOL
Anesthesia: Monitor Anesthesia Care

## 2021-07-16 MED ORDER — LACTATED RINGERS IV SOLN: INTRAVENOUS | Status: DC | PRN

## 2021-07-16 MED ORDER — ONDANSETRON HCL 4 MG/2ML IJ SOLN
INTRAMUSCULAR | Status: DC | PRN
  Administered 2021-07-16: 4 mg via INTRAVENOUS

## 2021-07-16 MED ORDER — HYDRALAZINE HCL 20 MG/ML IJ SOLN
5.0000 mg | Freq: Once | INTRAMUSCULAR | Status: AC
  Administered 2021-07-16: 5 mg via INTRAVENOUS

## 2021-07-16 MED ORDER — AMLODIPINE BESYLATE 5 MG PO TABS
5.0000 mg | ORAL_TABLET | Freq: Every day | ORAL | Status: DC
  Administered 2021-07-16: 5 mg via ORAL
  Filled 2021-07-16: qty 1

## 2021-07-16 MED ORDER — PROPOFOL 500 MG/50ML IV EMUL
INTRAVENOUS | Status: DC | PRN
  Administered 2021-07-16: 20 mg via INTRAVENOUS
  Administered 2021-07-16: 30 mg via INTRAVENOUS

## 2021-07-16 MED ORDER — PROPOFOL 500 MG/50ML IV EMUL
INTRAVENOUS | Status: DC | PRN
  Administered 2021-07-16: 125 ug/kg/min via INTRAVENOUS

## 2021-07-16 MED ORDER — PROPOFOL 10 MG/ML IV BOLUS
INTRAVENOUS | Status: AC
  Filled 2021-07-16: qty 40

## 2021-07-16 MED ORDER — HYDRALAZINE HCL 20 MG/ML IJ SOLN
INTRAMUSCULAR | Status: AC
  Filled 2021-07-16: qty 1

## 2021-07-16 MED ORDER — SODIUM CHLORIDE 0.9 % IV SOLN: INTRAVENOUS | Status: DC

## 2021-07-16 SURGICAL SUPPLY — 22 items

## 2021-07-16 NOTE — Anesthesia Postprocedure Evaluation (Signed)
Anesthesia Post Note  Patient: Lindsay Travis  Procedure(s) Performed: COLONOSCOPY WITH PROPOFOL POLYPECTOMY     Patient location during evaluation: Endoscopy Anesthesia Type: MAC Level of consciousness: awake and alert Pain management: pain level controlled Vital Signs Assessment: post-procedure vital signs reviewed and stable Respiratory status: spontaneous breathing, nonlabored ventilation and respiratory function stable Cardiovascular status: stable and blood pressure returned to baseline Postop Assessment: no apparent nausea or vomiting Anesthetic complications: no   No notable events documented.  Last Vitals:  Vitals:   07/16/21 1130 07/16/21 1143  BP: (!) 156/100 (!) 170/102  Pulse: 69 68  Resp: 18 20  Temp:    SpO2: 100% 100%    Last Pain:  Vitals:   07/16/21 1143  TempSrc:   PainSc: 0-No pain                 Elleigh Cassetta,W. EDMOND

## 2021-07-16 NOTE — Transfer of Care (Signed)
Immediate Anesthesia Transfer of Care Note  Patient: Lindsay Travis  Procedure(s) Performed: COLONOSCOPY WITH PROPOFOL POLYPECTOMY  Patient Location: PACU  Anesthesia Type:MAC  Level of Consciousness: awake, alert  and oriented  Airway & Oxygen Therapy: Patient Spontanous Breathing and Patient connected to face mask oxygen  Post-op Assessment: Report given to RN, Post -op Vital signs reviewed and stable and Patient moving all extremities X 4  Post vital signs: Reviewed and stable  Last Vitals:  Vitals Value Taken Time  BP 176/100 07/16/21 1030  Temp 36.1 C 07/16/21 0920  Pulse 59 07/16/21 1036  Resp 11 07/16/21 1036  SpO2 99 % 07/16/21 1036  Vitals shown include unvalidated device data.  Last Pain:  Vitals:   07/16/21 1010  TempSrc:   PainSc: 2          Complications: No notable events documented.

## 2021-07-16 NOTE — Discharge Instructions (Signed)
I found and removed one small polyp tpday. This is a growth that is removed so that it does not turn into cancer.  I will let you know pathology results and when to have another routine colonoscopy by mail and/or My Chart.  I appreciate the opportunity to care for you. Gatha Mayer, MD, FACG  YOU HAD AN ENDOSCOPIC PROCEDURE TODAY: Refer to the procedure report and other information in the discharge instructions given to you for any specific questions about what was found during the examination. If this information does not answer your questions, please call Dr. Celesta Aver office at [phone] to clarify.   YOU SHOULD EXPECT: Some feelings of bloating in the abdomen. Passage of more gas than usual. Walking can help get rid of the air that was put into your GI tract during the procedure and reduce the bloating. If you had a lower endoscopy (such as a colonoscopy or flexible sigmoidoscopy) you may notice spotting of blood in your stool or on the toilet paper. Some abdominal soreness may be present for a day or two, also.  DIET: Your first meal following the procedure should be a light meal and then it is ok to progress to your normal diet. A half-sandwich or bowl of soup is an example of a good first meal. Heavy or fried foods are harder to digest and may make you feel nauseous or bloated. Drink plenty of fluids but you should avoid alcoholic beverages for 24 hours.   ACTIVITY: Your care partner should take you home directly after the procedure. You should plan to take it easy, moving slowly for the rest of the day. You can resume normal activity the day after the procedure however YOU SHOULD NOT DRIVE, use power tools, machinery or perform tasks that involve climbing or major physical exertion for 24 hours (because of the sedation medicines used during the test).   SYMPTOMS TO REPORT IMMEDIATELY: A gastroenterologist can be reached at any hour. Please call [phone]  for any of the following  symptoms:  Following lower endoscopy (colonoscopy, flexible sigmoidoscopy) Excessive amounts of blood in the stool  Significant tenderness, worsening of abdominal pains  Swelling of the abdomen that is new, acute  Fever of 100 or higher

## 2021-07-16 NOTE — Progress Notes (Signed)
Patient in recovery with epigastric pain, Dr. Carlean Purl in to assess, orders received, pain relieved by time, xrays negative.  BP elevated in recovery, po norvasc given per anesthesia see MAR, no change in BP after 1 hour, IV hydralazine given. BP reduced slightly.  Dr. Ola Spurr updated, order to discharge home.  Educated patient to follow up with PCP regarding blood pressure,  Patient and daughter verbalized understanding.

## 2021-07-16 NOTE — Anesthesia Preprocedure Evaluation (Addendum)
Anesthesia Evaluation  Patient identified by MRN, date of birth, ID band Patient awake    Reviewed: Allergy & Precautions, H&P , NPO status , Patient's Chart, lab work & pertinent test results  Airway Mallampati: II  TM Distance: >3 FB Neck ROM: Full    Dental no notable dental hx. (+) Teeth Intact, Dental Advisory Given   Pulmonary neg pulmonary ROS,    Pulmonary exam normal breath sounds clear to auscultation       Cardiovascular hypertension, Pt. on medications  Rhythm:Regular Rate:Normal     Neuro/Psych CVA negative psych ROS   GI/Hepatic negative GI ROS, Neg liver ROS,   Endo/Other  diabetes, Type 2, Oral Hypoglycemic AgentsMorbid obesity  Renal/GU negative Renal ROS  negative genitourinary   Musculoskeletal  (+) Arthritis , Osteoarthritis,    Abdominal   Peds  Hematology  (+) Blood dyscrasia, anemia ,   Anesthesia Other Findings   Reproductive/Obstetrics negative OB ROS                            Anesthesia Physical Anesthesia Plan  ASA: 3  Anesthesia Plan: MAC   Post-op Pain Management:    Induction: Intravenous  PONV Risk Score and Plan: 2 and Propofol infusion and Treatment may vary due to age or medical condition  Airway Management Planned: Simple Face Mask  Additional Equipment:   Intra-op Plan:   Post-operative Plan:   Informed Consent: I have reviewed the patients History and Physical, chart, labs and discussed the procedure including the risks, benefits and alternatives for the proposed anesthesia with the patient or authorized representative who has indicated his/her understanding and acceptance.     Dental advisory given  Plan Discussed with: CRNA  Anesthesia Plan Comments:         Anesthesia Quick Evaluation

## 2021-07-16 NOTE — Progress Notes (Signed)
She developed epigastric pain and elevated BP - Xrays negative, pain remitted and BP tx by anesthesia -

## 2021-07-16 NOTE — Op Note (Signed)
[facility] Patient Name: Lindsay Travis Procedure Date: 07/16/2021 MRN: [id number] Attending MD: Gatha Mayer , MD Date of Birth: Apr 07, 1961 CSN: [id number] Age: 60 Admit Type: Outpatient Procedure:                Colonoscopy Indications:              Screening for colorectal malignant neoplasm, This                            is the patient's first colonoscopy Providers:                Gatha Mayer, MD, Doristine Johns, RN, Debi                            Mays, RN, Arnoldo Hooker, CRNA Referring MD:             [facility] [place] Medicines:                Propofol per Anesthesia, Monitored Anesthesia Care Complications:            No immediate complications. Estimated Blood Loss:     Estimated blood loss was minimal. Procedure:                Pre-Anesthesia Assessment:                           - Prior to the procedure, a History and Physical                            was performed, and patient medications and                            allergies were reviewed. The patient's tolerance of                            previous anesthesia was also reviewed. The risks                            and benefits of the procedure and the sedation                            options and risks were discussed with the patient.                            All questions were answered, and informed consent                            was obtained. Prior Anticoagulants: The patient has                            taken no previous anticoagulant or antiplatelet                            agents. ASA Grade Assessment: III - A patient with  severe systemic disease. After reviewing the risks                            and benefits, the patient was deemed in                            satisfactory condition to undergo the procedure.                           After obtaining informed consent, the colonoscope                            was passed under direct  vision. Throughout the                            procedure, the patient's blood pressure, pulse, and                            oxygen saturations were monitored continuously. The                            CF-HQ190L ([id number]) Olympus colonoscope was                            introduced through the anus and advanced to the the                            cecum, identified by appendiceal orifice and                            ileocecal valve. The colonoscopy was somewhat                            difficult due to significant looping. Successful                            completion of the procedure was aided by applying                            abdominal pressure. The patient tolerated the                            procedure well. The quality of the bowel                            preparation was excellent. The bowel preparation                            used was SUPREP via split dose instruction. The                            ileocecal valve, appendiceal orifice, and rectum  were photographed. Scope In: 8:52:06 AM Scope Out: 9:13:43 AM Scope Withdrawal Time: 0 hours 14 minutes 12 seconds  Total Procedure Duration: 0 hours 21 minutes 37 seconds  Findings:      The perianal and digital rectal examinations were normal.      A 5 mm polyp was found in the appendiceal orifice. The polyp was       sessile. The polyp was removed with a cold snare. Resection and       retrieval were complete. Verification of patient identification for the       specimen was done. Estimated blood loss was minimal.      Scattered small-mouthed diverticula were found in the sigmoid colon and       descending colon.      The exam was otherwise without abnormality on direct and retroflexion       views. Impression:               - One 5 mm polyp at the appendiceal orifice,                            removed with a cold snare. Resected and retrieved.                           -  Diverticulosis in the sigmoid colon and in the                            descending colon.                           - The examination was otherwise normal on direct                            and retroflexion views. Moderate Sedation:      Not Applicable - Patient had care per Anesthesia. Recommendation:           - Patient has a contact number available for                            emergencies. The signs and symptoms of potential                            delayed complications were discussed with the                            patient. Return to normal activities tomorrow.                            Written discharge instructions were provided to the                            patient.                           - Resume previous diet.                           - Continue present medications.                           -  Await pathology results.                           - Repeat colonoscopy is recommended. The                            colonoscopy date will be determined after pathology                            results from today's exam become available for                            review. Procedure Code(s):        --- Professional ---                           [phone], Colonoscopy, flexible; with removal of                            tumor(s), polyp(s), or other lesion(s) by snare                            technique Diagnosis Code(s):        --- Professional ---                           Z12.11, Encounter for screening for malignant                            neoplasm of colon                           K63.5, Polyp of colon                           K57.30, Diverticulosis of large intestine without                            perforation or abscess without bleeding CPT copyright 2019 American Medical Association. All rights reserved. The codes documented in this report are preliminary and upon coder review may  be revised to meet current compliance requirements. Gatha Mayer, MD 07/16/2021 9:26:20 AM This report has been signed electronically. Number of Addenda: 0

## 2021-07-16 NOTE — H&P (Signed)
[facility] History and Physical   Primary Care Physician:  Horald Pollen, MD   Reason for Procedure:   Colon cancer screening  Plan:    colonoscopy     HPI: Lindsay Travis is a 60 y.o. female presenting for a screening colonoscopy.   Past Medical History:  Diagnosis Date   Anemia    Arthritis    bilateral kneed and lower legs   Clotting disorder ([place])    Diabetes mellitus    recent high blood sugar and has RX but not taking  med -b/c she doesn't think she  is diabetic, just had lots of sugar in her diet when dx'd.-on meds   Hypertension    on meds   Shortness of breath    on exertion- has low hgb   Sickle cell trait [facility])     Past Surgical History:  Procedure Laterality Date   ABDOMINAL HYSTERECTOMY  09/11/2011   Procedure: HYSTERECTOMY ABDOMINAL;  Surgeon: Frederico Hamman, MD;  Location: [facility];  Service: Gynecology;  Laterality: N/A;    Prior to Admission medications   Medication Sig Start Date End Date Taking? Authorizing Provider  amLODipine (NORVASC) 5 MG tablet TAKE 1 TABLET(5 MG) BY MOUTH DAILY 03/01/21  Yes Sagardia, Ines Bloomer, MD  Ascorbic Acid (VITAMIN C) 1000 MG tablet Take 1,000 mg by mouth 2 (two) times a week.   Yes [provider]  aspirin 81 MG EC tablet Take 1 tablet (81 mg total) by mouth daily. 03/20/18  Yes Frann Rider, NP  blood glucose meter kit and supplies Per insurance preference. Check daily fasting blood glucose. (Dx. E11.9). 02/22/20  Yes Jacelyn Pi, Irma M, MD  glipiZIDE (GLUCOTROL) 5 MG tablet TAKE 1 TABLET(5 MG) BY MOUTH TWICE DAILY WITH A MEAL Patient taking differently: Take 5 mg by mouth in the morning. 04/23/21  Yes Sagardia, Ines Bloomer, MD  Na Sulfate-K Sulfate-Mg Sulf 17.5-3.13-1.6 GM/177ML SOLN Take 1 kit by mouth as directed. May use generic Suprep 07/10/21  Yes Gatha Mayer, MD  Omega-3 Fatty Acids (OMEGA-3 FISH OIL PO) Take 1 capsule by mouth in the morning.   Yes [provider]   OZEMPIC, 0.25 OR 0.5 MG/DOSE, 2 MG/1.5ML SOPN INJECT 0.5 MG INTO THE SKIN ONCE A WEEK Patient taking differently: Inject 0.5 mg into the skin every Tuesday. 07/05/21  Yes Sagardia, Ines Bloomer, MD  ezetimibe (ZETIA) 10 MG tablet Take 1 tablet (10 mg total) by mouth daily. Patient not taking: Reported on 07/11/2021 01/09/21   Horald Pollen, MD  hydrochlorothiazide (HYDRODIURIL) 25 MG tablet TAKE 1 TABLET(25 MG) BY MOUTH DAILY Patient not taking: Reported on 07/11/2021 01/25/21   Horald Pollen, MD  gabapentin (NEURONTIN) 300 MG capsule Take 300 mg by mouth 3 (three) times daily. 1 cap 1st week, 2 caps 2 wks, 3 caps 3 wks, continue. Different Provider.  10/04/19  [provider]    Current Facility-Administered Medications  Medication Dose Route Frequency Provider Last Rate Last Admin   0.9 %  sodium chloride infusion   Intravenous Continuous Gatha Mayer, MD        Allergies as of 05/17/2021 - Review Complete 04/19/2021  Allergen Reaction Noted   Lisinopril Other (See Comments) 01/19/2015   Losartan Other (See Comments) 01/19/2015   Aspirin Other (See Comments) 04/18/2011    Family History  Problem Relation Age of Onset   Hypertension Other    Colon polyps Neg Hx    Colon cancer Neg Hx  Heart disease Neg Hx    Esophageal cancer Neg Hx    Rectal cancer Neg Hx    Stomach cancer Neg Hx     Social History   Socioeconomic History   Marital status: Divorced    Spouse name: Not on file   Number of children: Not on file   Years of education: Not on file   Highest education level: Not on file  Occupational History   Not on file  Tobacco Use   Smoking status: Never   Smokeless tobacco: Never  Vaping Use   Vaping Use: Never used  Substance and Sexual Activity   Alcohol use: No   Drug use: No   Sexual activity: Never  Other Topics Concern   Review of Systems:  All other review of systems negative except as mentioned in the HPI.  Physical  Exam: Vital signs LMP 09/10/2011   General:   Alert,  Well-developed, well-nourished, pleasant and cooperative in NAD Lungs:  Clear throughout to auscultation.   Heart:  Regular rate and rhythm; no murmurs, clicks, rubs,  or gallops. Abdomen:  Soft, nontender and nondistended. Normal bowel sounds.   Neuro/Psych:  Alert and cooperative. Normal mood and affect. A and O x 3   '@Nichalos Brenton'  Simonne Maffucci, MD, [facility] [phone] (pager) 07/16/2021 8:18 AM@

## 2021-07-17 ENCOUNTER — Encounter (HOSPITAL_COMMUNITY): Payer: Self-pay | Admitting: Internal Medicine

## 2021-07-17 LAB — SURGICAL PATHOLOGY

## 2021-07-18 ENCOUNTER — Encounter: Payer: Self-pay | Admitting: Internal Medicine

## 2021-07-23 ENCOUNTER — Ambulatory Visit: Payer: No Typology Code available for payment source | Admitting: Emergency Medicine

## 2021-07-23 ENCOUNTER — Other Ambulatory Visit: Payer: Self-pay

## 2021-08-01 ENCOUNTER — Other Ambulatory Visit: Payer: Self-pay

## 2021-08-01 ENCOUNTER — Ambulatory Visit (INDEPENDENT_AMBULATORY_CARE_PROVIDER_SITE_OTHER): Payer: 59 | Admitting: Emergency Medicine

## 2021-08-01 ENCOUNTER — Encounter: Payer: Self-pay | Admitting: Emergency Medicine

## 2021-08-01 VITALS — BP 132/74 | HR 75 | Temp 98.9°F | Ht 61.02 in | Wt 241.0 lb

## 2021-08-01 DIAGNOSIS — E1169 Type 2 diabetes mellitus with other specified complication: Secondary | ICD-10-CM | POA: Diagnosis not present

## 2021-08-01 DIAGNOSIS — E669 Obesity, unspecified: Secondary | ICD-10-CM

## 2021-08-01 DIAGNOSIS — E1159 Type 2 diabetes mellitus with other circulatory complications: Secondary | ICD-10-CM | POA: Diagnosis not present

## 2021-08-01 DIAGNOSIS — T466X5A Adverse effect of antihyperlipidemic and antiarteriosclerotic drugs, initial encounter: Secondary | ICD-10-CM

## 2021-08-01 DIAGNOSIS — G720 Drug-induced myopathy: Secondary | ICD-10-CM

## 2021-08-01 DIAGNOSIS — I152 Hypertension secondary to endocrine disorders: Secondary | ICD-10-CM | POA: Diagnosis not present

## 2021-08-01 DIAGNOSIS — Z23 Encounter for immunization: Secondary | ICD-10-CM

## 2021-08-01 LAB — COMPREHENSIVE METABOLIC PANEL
ALT: 19 U/L (ref 0–35)
AST: 16 U/L (ref 0–37)
Albumin: 4.2 g/dL (ref 3.5–5.2)
Alkaline Phosphatase: 63 U/L (ref 39–117)
BUN: 20 mg/dL (ref 6–23)
CO2: 33 mEq/L — ABNORMAL HIGH (ref 19–32)
Calcium: 9.4 mg/dL (ref 8.4–10.5)
Chloride: 104 mEq/L (ref 96–112)
Creatinine, Ser: 1.04 mg/dL (ref 0.40–1.20)
GFR: 58.89 mL/min — ABNORMAL LOW (ref >60.00)
Glucose, Bld: 105 mg/dL — ABNORMAL HIGH (ref 70–99)
Potassium: 4 mEq/L (ref 3.5–5.1)
Sodium: 142 mEq/L (ref 135–145)
Total Bilirubin: 0.3 mg/dL (ref 0.2–1.2)
Total Protein: 7.3 g/dL (ref 6.0–8.3)

## 2021-08-01 LAB — LIPID PANEL
Cholesterol: 212 mg/dL — ABNORMAL HIGH (ref 0–200)
HDL: 57.4 mg/dL (ref >39.00)
LDL Cholesterol: 116 mg/dL — ABNORMAL HIGH (ref 0–99)
NonHDL: 154.82
Total CHOL/HDL Ratio: 4
Triglycerides: 192 mg/dL — ABNORMAL HIGH (ref 0.0–149.0)
VLDL: 38.4 mg/dL (ref 0.0–40.0)

## 2021-08-01 LAB — POCT GLYCOSYLATED HEMOGLOBIN (HGB A1C): Hemoglobin A1C: 6.5 % — AB (ref 4.0–5.6)

## 2021-08-01 NOTE — Patient Instructions (Signed)

## 2021-08-01 NOTE — Assessment & Plan Note (Signed)
Well-controlled hypertension. BP Readings from Last 3 Encounters:  08/01/21 132/74  07/16/21 (!) 170/102  04/19/21 136/82  Continue amlodipine 5 mg daily. Well-controlled diabetes with hemoglobin A1c at 6.5 Lab Results  Component Value Date   HGBA1C 6.5 (A) 08/01/2021  Continue weekly Ozempic 0.5 mg and 5 mg of glipizide in the morning. Diet and nutrition discussed. Follow-up in 3 months

## 2021-08-01 NOTE — Progress Notes (Signed)
Lindsay Travis 60 y.o.   Chief Complaint  Patient presents with   Follow-up    3 month f/u    HISTORY OF PRESENT ILLNESS: This is a 60 y.o. female with history of hypertension and diabetes here for follow-up. Not taking Zetia.  States it gives her leg cramps. BP Readings from Last 3 Encounters:  08/01/21 132/74  07/16/21 (!) 170/102  04/19/21 136/82   Lab Results  Component Value Date   HGBA1C 7.2 (A) 04/19/2021   Last office visit assessment and plan as follows: ASSESSMENT & PLAN: Hypertension associated with diabetes ([place]) Uncontrolled hypertension.  Continue amlodipine 5 mg daily. Much improved diabetes with hemoglobin A1c better than before at 7.2.  Continue weekly Ozempic 0.5 mg and glipizide 5 mg twice a day. Diet and nutrition discussed. Continue Zetia 10 mg daily.  HPI   Prior to Admission medications   Medication Sig Start Date End Date Taking? Authorizing Provider  amLODipine (NORVASC) 5 MG tablet TAKE 1 TABLET(5 MG) BY MOUTH DAILY 03/01/21  Yes Campbell Agramonte, Ines Bloomer, MD  Ascorbic Acid (VITAMIN C) 1000 MG tablet Take 1,000 mg by mouth 2 (two) times a week.   Yes [provider]  aspirin 81 MG EC tablet Take 1 tablet (81 mg total) by mouth daily. 03/20/18  Yes Frann Rider, NP  blood glucose meter kit and supplies Per insurance preference. Check daily fasting blood glucose. (Dx. E11.9). 02/22/20  Yes Jacelyn Pi, Irma M, MD  glipiZIDE (GLUCOTROL) 5 MG tablet TAKE 1 TABLET(5 MG) BY MOUTH TWICE DAILY WITH A MEAL Patient taking differently: Take 5 mg by mouth in the morning. 04/23/21  Yes Neri Vieyra, Ines Bloomer, MD  Omega-3 Fatty Acids (OMEGA-3 FISH OIL PO) Take 1 capsule by mouth in the morning.   Yes [provider]  OZEMPIC, 0.25 OR 0.5 MG/DOSE, 2 MG/1.5ML SOPN INJECT 0.5 MG INTO THE SKIN ONCE A WEEK Patient taking differently: Inject 0.5 mg into the skin every Tuesday. 07/05/21  Yes Robynne Roat, Ines Bloomer, MD  ezetimibe (ZETIA) 10 MG tablet Take  1 tablet (10 mg total) by mouth daily. Patient not taking: Reported on 07/11/2021 01/09/21   Horald Pollen, MD  hydrochlorothiazide (HYDRODIURIL) 25 MG tablet TAKE 1 TABLET(25 MG) BY MOUTH DAILY Patient not taking: Reported on 07/11/2021 01/25/21   Horald Pollen, MD  gabapentin (NEURONTIN) 300 MG capsule Take 300 mg by mouth 3 (three) times daily. 1 cap 1st week, 2 caps 2 wks, 3 caps 3 wks, continue. Different Provider.  10/04/19  [provider]    Allergies  Allergen Reactions   Lisinopril Other (See Comments)    Did not feel good   Losartan Other (See Comments)    headache   Aspirin Other (See Comments)    Abdominal pain only 325 mg     Patient Active Problem List   Diagnosis Date Noted   Obesity, diabetes, and hypertension syndrome ([place]) 01/09/2021   Morbid obesity ([place]) 09/18/2019   Cerebral thrombosis with cerebral infarction 01/10/2018   Dyslipidemia    Type 2 diabetes mellitus with hyperglycemia, without long-term current use of insulin ([place]) 08/07/2015   Varicose veins of lower extremities with other complications 75/44/9201   Fibroid, uterus 04/18/2011   Hypertension associated with diabetes ([place]) 04/18/2011    Past Medical History:  Diagnosis Date   Anemia    Arthritis    bilateral kneed and lower legs   Clotting disorder ([place])    Diabetes mellitus    recent high blood sugar  and has RX but not taking  med -b/c she doesn't think she  is diabetic, just had lots of sugar in her diet when dx'd.-on meds   Hx of adenomatous polyp of colon 07/2021   Repeat colonoscopy 2029   Hypertension    on meds   Shortness of breath    on exertion- has low hgb   Sickle cell trait [facility])     Past Surgical History:  Procedure Laterality Date   ABDOMINAL HYSTERECTOMY  09/11/2011   Procedure: HYSTERECTOMY ABDOMINAL;  Surgeon: Frederico Hamman, MD;  Location: [facility];  Service: Gynecology;  Laterality: N/A;   COLONOSCOPY WITH PROPOFOL N/A 07/16/2021   Procedure:  COLONOSCOPY WITH PROPOFOL;  Surgeon: Gatha Mayer, MD;  Location: [facility];  Service: Endoscopy;  Laterality: N/A;   POLYPECTOMY  07/16/2021   Procedure: POLYPECTOMY;  Surgeon: Gatha Mayer, MD;  Location: [facility];  Service: Endoscopy;;    Social History   Socioeconomic History   Marital status: Divorced    Spouse name: Not on file   Number of children: Not on file   Years of education: Not on file   Highest education level: Not on file  Occupational History   Not on file  Tobacco Use   Smoking status: Never   Smokeless tobacco: Never  Vaping Use   Vaping Use: Never used  Substance and Sexual Activity   Alcohol use: No   Drug use: No   Sexual activity: Never  Other Topics Concern   Not on file  Social History Narrative   Not on file   Social Determinants of Health   Financial Resource Strain: Not on file  Food Insecurity: Not on file  Transportation Needs: Not on file  Physical Activity: Not on file  Stress: Not on file  Social Connections: Not on file  Intimate Partner Violence: Not on file    Family History  Problem Relation Age of Onset   Hypertension Other    Colon polyps Neg Hx    Colon cancer Neg Hx    Heart disease Neg Hx    Esophageal cancer Neg Hx    Rectal cancer Neg Hx    Stomach cancer Neg Hx      Review of Systems  Constitutional: Negative.  Negative for chills and fever.  HENT: Negative.  Negative for congestion and sore throat.   Respiratory: Negative.  Negative for cough and shortness of breath.   Cardiovascular: Negative.  Negative for chest pain and palpitations.  Gastrointestinal: Negative.  Negative for abdominal pain, diarrhea, nausea and vomiting.  Genitourinary: Negative.  Negative for dysuria and hematuria.  Musculoskeletal:  Positive for back pain and joint pain.  Skin: Negative.  Negative for rash.  Neurological: Negative.  Negative for dizziness and headaches.  All other systems reviewed and are  negative.  Vitals:   08/01/21 1340  BP: 132/74  Pulse: 75  Temp: 98.9 F (37.2 C)  SpO2: 97%   Wt Readings from Last 3 Encounters:  08/01/21 241 lb (109.3 kg)  07/16/21 240 lb (108.9 kg)  05/28/21 240 lb (108.9 kg)    Physical Exam Vitals reviewed.  Constitutional:      Appearance: Normal appearance.  HENT:     Head: Normocephalic.  Eyes:     Extraocular Movements: Extraocular movements intact.     Pupils: Pupils are equal, round, and reactive to light.  Cardiovascular:     Rate and Rhythm: Normal rate and regular rhythm.     Pulses:  Normal pulses.     Heart sounds: Normal heart sounds.  Pulmonary:     Effort: Pulmonary effort is normal.     Breath sounds: Normal breath sounds.  Musculoskeletal:        General: Normal range of motion.     Cervical back: Normal range of motion.  Skin:    General: Skin is warm and dry.     Capillary Refill: Capillary refill takes less than 2 seconds.  Neurological:     General: No focal deficit present.     Mental Status: She is alert and oriented to person, place, and time.  Psychiatric:        Mood and Affect: Mood normal.        Behavior: Behavior normal.    Results for orders placed or performed in visit on 08/01/21 (from the past 24 hour(s))  POCT glycosylated hemoglobin (Hb A1C)     Status: Abnormal   Collection Time: 08/01/21  2:40 PM  Result Value Ref Range   Hemoglobin A1C 6.5 (A) 4.0 - 5.6 %   HbA1c POC (<> result, manual entry)     HbA1c, POC (prediabetic range)     HbA1c, POC (controlled diabetic range)      ASSESSMENT & PLAN: Problem List Items Addressed This Visit       Cardiovascular and Mediastinum   Obesity, diabetes, and hypertension syndrome ([place]) - Primary    Well-controlled hypertension. BP Readings from Last 3 Encounters:  08/01/21 132/74  07/16/21 (!) 170/102  04/19/21 136/82  Continue amlodipine 5 mg daily. Well-controlled diabetes with hemoglobin A1c at 6.5 Lab Results  Component Value Date    HGBA1C 6.5 (A) 08/01/2021  Continue weekly Ozempic 0.5 mg and 5 mg of glipizide in the morning. Diet and nutrition discussed. Follow-up in 3 months       Relevant Orders   POCT glycosylated hemoglobin (Hb A1C) (Completed)   Comprehensive metabolic panel   Lipid panel   Ambulatory referral to Ophthalmology     Musculoskeletal and Integument   Statin myopathy   Other Visit Diagnoses     Need for immunization against influenza       Relevant Orders   Flu Vaccine QUAD 25moIM (Fluarix, Fluzone & Alfiuria Quad PF) (Completed)      Patient Instructions  Diabetes Mellitus and Nutrition, Adult When you have diabetes, or diabetes mellitus, it is very important to have healthy eating habits because your blood sugar (glucose) levels are greatly affected by what you eat and drink. Eating healthy foods in the right amounts, at about the same times every day, can help you: Manage your blood glucose. Lower your risk of heart disease. Improve your blood pressure. Reach or maintain a healthy weight. What can affect my meal plan? Every person with diabetes is different, and each person has different needs for a meal plan. Your health care provider may recommend that you work with a dietitian to make a meal plan that is best for you. Your meal plan may vary depending on factors such as: The calories you need. The medicines you take. Your weight. Your blood glucose, blood pressure, and cholesterol levels. Your activity level. Other health conditions you have, such as heart or kidney disease. How do carbohydrates affect me? Carbohydrates, also called carbs, affect your blood glucose level more than any other type of food. Eating carbs raises the amount of glucose in your blood. It is important to know how many carbs you can safely have in each meal.  This is different for every person. Your dietitian can help you calculate how many carbs you should have at each meal and for each snack. How does  alcohol affect me? Alcohol can cause a decrease in blood glucose (hypoglycemia), especially if you use insulin or take certain diabetes medicines by mouth. Hypoglycemia can be a life-threatening condition. Symptoms of hypoglycemia, such as sleepiness, dizziness, and confusion, are similar to symptoms of having too much alcohol. Do not drink alcohol if: Your health care provider tells you not to drink. You are pregnant, may be pregnant, or are planning to become pregnant. If you drink alcohol: Limit how much you have to: 0-1 drink a day for women. 0-2 drinks a day for men. Know how much alcohol is in your drink. In the [place] one drink equals one 12 oz bottle of beer (355 mL), one 5 oz glass of wine (148 mL), or one 1 oz glass of hard liquor (44 mL). Keep yourself hydrated with water, diet soda, or unsweetened iced tea. Keep in mind that regular soda, juice, and other mixers may contain a lot of sugar and must be counted as carbs. What are tips for following this plan? Reading food labels Start by checking the serving size on the Nutrition Facts label of packaged foods and drinks. The number of calories and the amount of carbs, fats, and other nutrients listed on the label are based on one serving of the item. Many items contain more than one serving per package. Check the total grams (g) of carbs in one serving. Check the number of grams of saturated fats and trans fats in one serving. Choose foods that have a low amount or none of these fats. Check the number of milligrams (mg) of salt (sodium) in one serving. Most people should limit total sodium intake to less than 2,300 mg per day. Always check the nutrition information of foods labeled as "low-fat" or "nonfat." These foods may be higher in added sugar or refined carbs and should be avoided. Talk to your dietitian to identify your daily goals for nutrients listed on the label. Shopping Avoid buying canned, pre-made, or processed foods. These  foods tend to be high in fat, sodium, and added sugar. Shop around the outside edge of the grocery store. This is where you will most often find fresh fruits and vegetables, bulk grains, fresh meats, and fresh dairy products. Cooking Use low-heat cooking methods, such as baking, instead of high-heat cooking methods, such as deep frying. Cook using healthy oils, such as olive, canola, or sunflower oil. Avoid cooking with butter, cream, or high-fat meats. Meal planning Eat meals and snacks regularly, preferably at the same times every day. Avoid going long periods of time without eating. Eat foods that are high in fiber, such as fresh fruits, vegetables, beans, and whole grains. Eat 4-6 oz (112-168 g) of lean protein each day, such as lean meat, chicken, fish, eggs, or tofu. One ounce (oz) (28 g) of lean protein is equal to: 1 oz (28 g) of meat, chicken, or fish. 1 egg.  cup (62 g) of tofu. Eat some foods each day that contain healthy fats, such as avocado, nuts, seeds, and fish. What foods should I eat? Fruits Berries. Apples. Oranges. Peaches. Apricots. Plums. Grapes. Mangoes. Papayas. Pomegranates. Kiwi. Cherries. Vegetables Leafy greens, including lettuce, spinach, kale, chard, collard greens, mustard greens, and cabbage. Beets. Cauliflower. Broccoli. Carrots. Green beans. Tomatoes. Peppers. Onions. Cucumbers. Brussels sprouts. Grains Whole grains, such as whole-wheat or whole-grain  bread, crackers, tortillas, cereal, and pasta. Unsweetened oatmeal. Quinoa. Brown or wild rice. Meats and other proteins Seafood. Poultry without skin. Lean cuts of poultry and beef. Tofu. Nuts. Seeds. Dairy Low-fat or fat-free dairy products such as milk, yogurt, and cheese. The items listed above may not be a complete list of foods and beverages you can eat and drink. Contact a dietitian for more information. What foods should I avoid? Fruits Fruits canned with syrup. Vegetables Canned vegetables.  Frozen vegetables with butter or cream sauce. Grains Refined white flour and flour products such as bread, pasta, snack foods, and cereals. Avoid all processed foods. Meats and other proteins Fatty cuts of meat. Poultry with skin. Breaded or fried meats. Processed meat. Avoid saturated fats. Dairy Full-fat yogurt, cheese, or milk. Beverages Sweetened drinks, such as soda or iced tea. The items listed above may not be a complete list of foods and beverages you should avoid. Contact a dietitian for more information. Questions to ask a health care provider Do I need to meet with a certified diabetes care and education specialist? Do I need to meet with a dietitian? What number can I call if I have questions? When are the best times to check my blood glucose? Where to find more information: American Diabetes Association: diabetes.org Academy of Nutrition and Dietetics: eatright.[employer] of Diabetes and Digestive and Kidney Diseases: [employer]is Association of Diabetes Care & Education Specialists: diabeteseducator.org Summary It is important to have healthy eating habits because your blood sugar (glucose) levels are greatly affected by what you eat and drink. It is important to use alcohol carefully. A healthy meal plan will help you manage your blood glucose and lower your risk of heart disease. Your health care provider may recommend that you work with a dietitian to make a meal plan that is best for you. This information is not intended to replace advice given to you by your health care provider. Make sure you discuss any questions you have with your health care provider. Document Revised: 03/29/2020 Document Reviewed: 03/29/2020 Elsevier Patient Education  2022 [place], MD [facility]

## 2021-08-07 LAB — HM DIABETES EYE EXAM

## 2021-08-28 ENCOUNTER — Telehealth: Payer: Self-pay | Admitting: Emergency Medicine

## 2021-08-28 NOTE — Telephone Encounter (Signed)
Patient calling for status of lab results from 08-01-2021  Informed patient of results and provider's  recommendations  Patient requesting a call back to discuss further details of elevated cholesterol and triglycerides

## 2021-09-12 ENCOUNTER — Ambulatory Visit: Payer: No Typology Code available for payment source | Admitting: Family Medicine

## 2021-09-12 ENCOUNTER — Ambulatory Visit: Payer: No Typology Code available for payment source | Admitting: Sports Medicine

## 2021-09-14 ENCOUNTER — Telehealth: Payer: Self-pay | Admitting: Emergency Medicine

## 2021-09-14 DIAGNOSIS — I152 Hypertension secondary to endocrine disorders: Secondary | ICD-10-CM

## 2021-09-14 DIAGNOSIS — E1159 Type 2 diabetes mellitus with other circulatory complications: Secondary | ICD-10-CM

## 2021-09-14 NOTE — Telephone Encounter (Signed)
1.Medication Requested: OZEMPIC, 0.25 OR 0.5 MG/DOSE, 2 MG/1.5ML SOPN  2. Pharmacy (Name, Street, [place]): [facility] ([place]), [place] - [address]  Phone:  [phone] Fax:  [phone]   3. On Med List: yes  4. Last Visit with PCP: 11.23.22  5. Next visit date with PCP: 03.01.23   Agent: Please be advised that RX refills may take up to 3 business days. We ask that you follow-up with your pharmacy.

## 2021-09-17 ENCOUNTER — Ambulatory Visit: Payer: Self-pay | Admitting: Family Medicine

## 2021-09-17 MED ORDER — OZEMPIC (0.25 OR 0.5 MG/DOSE) 2 MG/1.5ML ~~LOC~~ SOPN: 0.5000 mg | PEN_INJECTOR | SUBCUTANEOUS | 3 refills | Status: DC

## 2021-09-17 NOTE — Telephone Encounter (Signed)
Refilled medication

## 2021-09-18 ENCOUNTER — Telehealth: Payer: Self-pay | Admitting: Emergency Medicine

## 2021-09-18 NOTE — Telephone Encounter (Signed)
Patient called in stating that her insurance does not cover ozempic wants to know if this is something she needs. [phone].

## 2021-09-19 ENCOUNTER — Ambulatory Visit (INDEPENDENT_AMBULATORY_CARE_PROVIDER_SITE_OTHER): Payer: Self-pay | Admitting: Family Medicine

## 2021-09-19 ENCOUNTER — Encounter: Payer: Self-pay | Admitting: Family Medicine

## 2021-09-19 VITALS — BP 145/84 | Ht 66.0 in | Wt 241.0 lb

## 2021-09-19 DIAGNOSIS — S39012A Strain of muscle, fascia and tendon of lower back, initial encounter: Secondary | ICD-10-CM

## 2021-09-19 NOTE — Telephone Encounter (Signed)
She was taking Ozempic without any problems.  What changed?

## 2021-09-19 NOTE — Telephone Encounter (Signed)
Notified patient to call insurance company to see which medication would be covered in the same class as Ozempic.

## 2021-09-19 NOTE — Progress Notes (Signed)
PCP: Horald Pollen, MD  Subjective:   HPI: Patient is a 61 y.o. female here for bilateral leg pain.  Patient reports she's had proximal bilateral leg pain for at least 3 months. No acute injury or trauma. She did not start any new medications prior to this. Associated spasms of her legs as well. Has trouble walking as a result of this. Cramping is worse at nighttime. Electrolytes test in November were normal.  Past Medical History:  Diagnosis Date   Anemia    Arthritis    bilateral kneed and lower legs   Clotting disorder ([place])    Diabetes mellitus    recent high blood sugar and has RX but not taking  med -b/c she doesn't think she  is diabetic, just had lots of sugar in her diet when dx'd.-on meds   Hx of adenomatous polyp of colon 07/2021   Repeat colonoscopy 2029   Hypertension    on meds   Shortness of breath    on exertion- has low hgb   Sickle cell trait ([facility])     Current Outpatient Medications on File Prior to Visit  Medication Sig Dispense Refill   amLODipine (NORVASC) 5 MG tablet TAKE 1 TABLET(5 MG) BY MOUTH DAILY 90 tablet 3   Ascorbic Acid (VITAMIN C) 1000 MG tablet Take 1,000 mg by mouth 2 (two) times a week.     aspirin 81 MG EC tablet Take 1 tablet (81 mg total) by mouth daily. 90 tablet 3   blood glucose meter kit and supplies Per insurance preference. Check daily fasting blood glucose. (Dx. E11.9). 1 each 11   ezetimibe (ZETIA) 10 MG tablet Take 1 tablet (10 mg total) by mouth daily. (Patient not taking: Reported on 07/11/2021) 90 tablet 3   glipiZIDE (GLUCOTROL) 5 MG tablet TAKE 1 TABLET(5 MG) BY MOUTH TWICE DAILY WITH A MEAL (Patient taking differently: Take 5 mg by mouth in the morning.) 180 tablet 3   hydrochlorothiazide (HYDRODIURIL) 25 MG tablet TAKE 1 TABLET(25 MG) BY MOUTH DAILY (Patient not taking: Reported on 07/11/2021) 90 tablet 3   Omega-3 Fatty Acids (OMEGA-3 FISH OIL PO) Take 1 capsule by mouth in the morning.     Semaglutide,0.25 or  0.5MG/DOS, (OZEMPIC, 0.25 OR 0.5 MG/DOSE,) 2 MG/1.5ML SOPN Inject 0.5 mg into the skin once a week. 1.5 mL 3   [DISCONTINUED] gabapentin (NEURONTIN) 300 MG capsule Take 300 mg by mouth 3 (three) times daily. 1 cap 1st week, 2 caps 2 wks, 3 caps 3 wks, continue. Different Provider.     No current facility-administered medications on file prior to visit.    Past Surgical History:  Procedure Laterality Date   ABDOMINAL HYSTERECTOMY  09/11/2011   Procedure: HYSTERECTOMY ABDOMINAL;  Surgeon: Frederico Hamman, MD;  Location: [facility];  Service: Gynecology;  Laterality: N/A;   COLONOSCOPY WITH PROPOFOL N/A 07/16/2021   Procedure: COLONOSCOPY WITH PROPOFOL;  Surgeon: Gatha Mayer, MD;  Location: [facility];  Service: Endoscopy;  Laterality: N/A;   POLYPECTOMY  07/16/2021   Procedure: POLYPECTOMY;  Surgeon: Gatha Mayer, MD;  Location: [facility];  Service: Endoscopy;;    Allergies  Allergen Reactions   Lisinopril Other (See Comments)    Did not feel good   Losartan Other (See Comments)    headache   Aspirin Other (See Comments)    Abdominal pain only 325 mg     BP (!) 145/84    Ht _0  (1.676 m)    Wt 241  lb (109.3 kg)    LMP 09/10/2011    BMI 38.90 kg/m   No flowsheet data found.  No flowsheet data found.      Objective:  Physical Exam:  Gen: NAD, comfortable in exam room  Back: No gross deformity, scoliosis. TTP bilateral gluteal muscles, low lumbar region.  No midline or bony TTP. FROM. Strength LEs 5/5 all muscle groups. 2+ MSRs in patellar and achilles tendons, equal bilaterally. Negative SLRs. Sensation intact to light touch bilaterally.   Assessment & Plan:  1. Low back pain/hip pain - with associated spasms.  No red flag signs/symptoms.  Start physical therapy and home exercise program.  Tylenol, topical medications (history of CVA so avoid nsaids).  Heat.  F/u in 5-6 weeks for reevaluation.

## 2021-09-19 NOTE — Patient Instructions (Signed)
Your exam is reassuring. You are have muscle spasms/strain of the low back muscles into your hips. I would like you to do physical therapy - call us when the insurance gets sorted out and we can put in a referral for you. In meantime start the home exercises and do these every day - this is very important for long term relief. Tylenol 500mg  1-2 tabs three times a day as needed. Consider topical capsaicin up to 4 times a day (this is over the counter) as needed also. Heat 15 minutes at a time 3-4 times a day if needed. Follow up with me in 5-6 weeks. I would consider imaging (x-rays, MRI) if you're not improving.

## 2021-09-20 ENCOUNTER — Telehealth: Payer: Self-pay

## 2021-09-20 NOTE — Telephone Encounter (Signed)
PA for Ozempic (0.25 or 0.5 MG/DOSE) 2MG /1.5ML pen-injectors  (Key: [id number])  Approved. This drug has been approved. Approved quantity: 1.5 units per 28 day(s). The drug has been approved from 09/05/2021 to 09/19/2022.

## 2021-09-21 ENCOUNTER — Encounter: Payer: Self-pay | Admitting: Emergency Medicine

## 2021-11-07 ENCOUNTER — Ambulatory Visit (INDEPENDENT_AMBULATORY_CARE_PROVIDER_SITE_OTHER): Payer: Self-pay

## 2021-11-07 ENCOUNTER — Other Ambulatory Visit: Payer: Self-pay

## 2021-11-07 ENCOUNTER — Encounter: Payer: Self-pay | Admitting: Emergency Medicine

## 2021-11-07 ENCOUNTER — Ambulatory Visit (INDEPENDENT_AMBULATORY_CARE_PROVIDER_SITE_OTHER): Payer: 59 | Admitting: Emergency Medicine

## 2021-11-07 VITALS — BP 122/76 | HR 76 | Temp 98.3°F | Ht 66.0 in | Wt 243.0 lb

## 2021-11-07 DIAGNOSIS — M25552 Pain in left hip: Secondary | ICD-10-CM

## 2021-11-07 DIAGNOSIS — E669 Obesity, unspecified: Secondary | ICD-10-CM

## 2021-11-07 DIAGNOSIS — N1831 Chronic kidney disease, stage 3a: Secondary | ICD-10-CM

## 2021-11-07 DIAGNOSIS — M25551 Pain in right hip: Secondary | ICD-10-CM

## 2021-11-07 DIAGNOSIS — T466X5A Adverse effect of antihyperlipidemic and antiarteriosclerotic drugs, initial encounter: Secondary | ICD-10-CM

## 2021-11-07 DIAGNOSIS — E1169 Type 2 diabetes mellitus with other specified complication: Secondary | ICD-10-CM

## 2021-11-07 DIAGNOSIS — E1159 Type 2 diabetes mellitus with other circulatory complications: Secondary | ICD-10-CM

## 2021-11-07 DIAGNOSIS — G720 Drug-induced myopathy: Secondary | ICD-10-CM

## 2021-11-07 DIAGNOSIS — I152 Hypertension secondary to endocrine disorders: Secondary | ICD-10-CM

## 2021-11-07 LAB — POCT GLYCOSYLATED HEMOGLOBIN (HGB A1C): Hemoglobin A1C: 7.6 % — AB (ref 4.0–5.6)

## 2021-11-07 MED ORDER — DAPAGLIFLOZIN PROPANEDIOL 10 MG PO TABS: 10.0000 mg | ORAL_TABLET | Freq: Every day | ORAL | 3 refills | Status: AC

## 2021-11-07 MED ORDER — AMLODIPINE BESYLATE 5 MG PO TABS: 5.0000 mg | ORAL_TABLET | Freq: Every day | ORAL | 3 refills | Status: DC

## 2021-11-07 MED ORDER — OZEMPIC (1 MG/DOSE) 4 MG/3ML ~~LOC~~ SOPN: 1.0000 mg | PEN_INJECTOR | SUBCUTANEOUS | 5 refills | Status: DC

## 2021-11-07 NOTE — Patient Instructions (Signed)

## 2021-11-07 NOTE — Assessment & Plan Note (Signed)
Well-controlled hypertension.  Continue amlodipine 5 mg daily. ?Uncontrolled diabetes with hemoglobin A1c of 7.6 ?Stop glipizide.  Increase Ozempic to 1 mg weekly and start Farxiga 10 mg daily. ?Diet and nutrition discussed. ?Follow-up in 3 months. ?

## 2021-11-07 NOTE — Progress Notes (Signed)
Lindsay Travis 61 y.o.   Chief Complaint  Patient presents with   Diabetes    F/U, med refill amlodipine and glipizide. Pt would like to know her blood type   Leg Pain    Both legs, pain goes to hips    HISTORY OF PRESENT ILLNESS: This is a 61 y.o. female with history of diabetes and hypertension here for follow-up. Presently taking 0.5 mg of Ozempic weekly and glipizide 5 mg in the morning. Takes amlodipine 5 mg daily. Complaining of bilateral hip pain on and off for several months.  Diabetes Pertinent negatives for hypoglycemia include no dizziness or headaches. Pertinent negatives for diabetes include no chest pain.  Leg Pain     Prior to Admission medications   Medication Sig Start Date End Date Taking? Authorizing Provider  amLODipine (NORVASC) 5 MG tablet TAKE 1 TABLET(5 MG) BY MOUTH DAILY 03/01/21  Yes Bernette Seeman, Ines Bloomer, MD  Ascorbic Acid (VITAMIN C) 1000 MG tablet Take 1,000 mg by mouth 2 (two) times a week.   Yes [provider]  aspirin 81 MG EC tablet Take 1 tablet (81 mg total) by mouth daily. 03/20/18  Yes Frann Rider, NP  blood glucose meter kit and supplies Per insurance preference. Check daily fasting blood glucose. (Dx. E11.9). 02/22/20  Yes Jacelyn Pi, Lilia Argue, MD  ezetimibe (ZETIA) 10 MG tablet Take 1 tablet (10 mg total) by mouth daily. 01/09/21  Yes Domanic Matusek, Ines Bloomer, MD  glipiZIDE (GLUCOTROL) 5 MG tablet TAKE 1 TABLET(5 MG) BY MOUTH TWICE DAILY WITH A MEAL Patient taking differently: Take 5 mg by mouth in the morning. 04/23/21  Yes Mitesh Rosendahl, Ines Bloomer, MD  hydrochlorothiazide (HYDRODIURIL) 25 MG tablet TAKE 1 TABLET(25 MG) BY MOUTH DAILY 01/25/21  Yes Kala Ambriz, Ines Bloomer, MD  Omega-3 Fatty Acids (OMEGA-3 FISH OIL PO) Take 1 capsule by mouth in the morning.   Yes [provider]  Semaglutide,0.25 or 0.5MG/DOS, (OZEMPIC, 0.25 OR 0.5 MG/DOSE,) 2 MG/1.5ML SOPN Inject 0.5 mg into the skin once a week. 09/17/21  Yes Charlyne Robertshaw, Ines Bloomer,  MD  gabapentin (NEURONTIN) 300 MG capsule Take 300 mg by mouth 3 (three) times daily. 1 cap 1st week, 2 caps 2 wks, 3 caps 3 wks, continue. Different Provider.  10/04/19  [provider]    Allergies  Allergen Reactions   Lisinopril Other (See Comments)    Did not feel good   Losartan Other (See Comments)    headache   Aspirin Other (See Comments)    Abdominal pain only 325 mg     Patient Active Problem List   Diagnosis Date Noted   Statin myopathy 08/01/2021   Obesity, diabetes, and hypertension syndrome ([place]) 01/09/2021   Morbid obesity ([place]) 09/18/2019   Cerebral thrombosis with cerebral infarction 01/10/2018   Dyslipidemia    Type 2 diabetes mellitus with hyperglycemia, without long-term current use of insulin ([place]) 08/07/2015   Varicose veins of lower extremities with other complications 41/28/2081   Fibroid, uterus 04/18/2011   Hypertension associated with diabetes ([place]) 04/18/2011    Past Medical History:  Diagnosis Date   Anemia    Arthritis    bilateral kneed and lower legs   Clotting disorder ([place])    Diabetes mellitus    recent high blood sugar and has RX but not taking  med -b/c she doesn't think she  is diabetic, just had lots of sugar in her diet when dx'd.-on meds   Hx of adenomatous polyp of colon 07/2021  Repeat colonoscopy 2029   Hypertension    on meds   Shortness of breath    on exertion- has low hgb   Sickle cell trait [facility])     Past Surgical History:  Procedure Laterality Date   ABDOMINAL HYSTERECTOMY  09/11/2011   Procedure: HYSTERECTOMY ABDOMINAL;  Surgeon: Frederico Hamman, MD;  Location: [facility];  Service: Gynecology;  Laterality: N/A;   COLONOSCOPY WITH PROPOFOL N/A 07/16/2021   Procedure: COLONOSCOPY WITH PROPOFOL;  Surgeon: Gatha Mayer, MD;  Location: [facility];  Service: Endoscopy;  Laterality: N/A;   POLYPECTOMY  07/16/2021   Procedure: POLYPECTOMY;  Surgeon: Gatha Mayer, MD;  Location: [facility];  Service:  Endoscopy;;    Social History   Socioeconomic History   Marital status: Divorced    Spouse name: Not on file   Number of children: Not on file   Years of education: Not on file   Highest education level: Not on file  Occupational History   Not on file  Tobacco Use   Smoking status: Never   Smokeless tobacco: Never  Vaping Use   Vaping Use: Never used  Substance and Sexual Activity   Alcohol use: No   Drug use: No   Sexual activity: Never  Other Topics Concern   Not on file  Social History Narrative   Not on file   Social Determinants of Health   Financial Resource Strain: Not on file  Food Insecurity: Not on file  Transportation Needs: Not on file  Physical Activity: Not on file  Stress: Not on file  Social Connections: Not on file  Intimate Partner Violence: Not on file    Family History  Problem Relation Age of Onset   Hypertension Other    Colon polyps Neg Hx    Colon cancer Neg Hx    Heart disease Neg Hx    Esophageal cancer Neg Hx    Rectal cancer Neg Hx    Stomach cancer Neg Hx      Review of Systems  Constitutional: Negative.  Negative for chills and fever.  HENT: Negative.  Negative for congestion and sore throat.   Respiratory: Negative.  Negative for cough and shortness of breath.   Cardiovascular: Negative.  Negative for chest pain and palpitations.  Gastrointestinal:  Negative for abdominal pain, diarrhea, nausea and vomiting.  Genitourinary: Negative.   Skin: Negative.  Negative for rash.  Neurological: Negative.  Negative for dizziness and headaches.  All other systems reviewed and are negative. Today's Vitals   11/07/21 1511  BP: 122/76  Pulse: 76  Temp: 98.3 F (36.8 C)  TempSrc: Oral  SpO2: 98%  Weight: 243 lb (110.2 kg)  Height: _0  (1.676 m)   Body mass index is 39.22 kg/m.   Physical Exam Vitals reviewed.  Constitutional:      Appearance: Normal appearance. She is obese.  HENT:     Head: Normocephalic.  Eyes:      Extraocular Movements: Extraocular movements intact.     Pupils: Pupils are equal, round, and reactive to light.  Cardiovascular:     Rate and Rhythm: Normal rate and regular rhythm.     Pulses: Normal pulses.     Heart sounds: Normal heart sounds.  Pulmonary:     Effort: Pulmonary effort is normal.     Breath sounds: Normal breath sounds.  Musculoskeletal:     Cervical back: No tenderness.     Comments: Hips: No localized tenderness.  Full range of motion.  Lymphadenopathy:     Cervical: No cervical adenopathy.  Skin:    General: Skin is warm and dry.     Capillary Refill: Capillary refill takes less than 2 seconds.  Neurological:     General: No focal deficit present.     Mental Status: She is alert and oriented to person, place, and time.  Psychiatric:        Mood and Affect: Mood normal.        Behavior: Behavior normal.   Results for orders placed or performed in visit on 11/07/21 (from the past 24 hour(s))  POCT glycosylated hemoglobin (Hb A1C)     Status: Abnormal   Collection Time: 11/07/21  3:22 PM  Result Value Ref Range   Hemoglobin A1C 7.6 (A) 4.0 - 5.6 %   HbA1c POC (<> result, manual entry)     HbA1c, POC (prediabetic range)     HbA1c, POC (controlled diabetic range)     DG HIPS BILAT WITH PELVIS MIN 5 VIEWS  Result Date: 11/07/2021 CLINICAL DATA:  Bilateral hip pain for 3 months EXAM: DG HIP (WITH OR WITHOUT PELVIS) 5+V BILAT COMPARISON:  None. FINDINGS: Frontal view of the pelvis as well as frontal and frogleg lateral views of both hips are obtained. No acute fracture, subluxation, or dislocation within either hip. Joint spaces are relatively well preserved. Sacroiliac joints are unremarkable. Prominent spondylosis at the lumbosacral junction. IMPRESSION: 1. Unremarkable pelvis and bilateral hips. 2. Lower lumbar degenerative changes. Electronically Signed   By: Randa Ngo M.D.   On: 11/07/2021 15:58     ASSESSMENT & PLAN: A total of 47 minutes was spent with  the patient and counseling/coordination of care regarding preparing for this visit, review of most recent office visit notes, review of most recent blood work results including today's hemoglobin A1c, review of all medications and multiple changes made, education on nutrition, cardiovascular risks associated with uncontrolled diabetes, prognosis, documentation and need for follow-up in 3 months.  Problem List Items Addressed This Visit       Cardiovascular and Mediastinum   Hypertension associated with diabetes ([place])   Relevant Medications   Semaglutide, 1 MG/DOSE, (OZEMPIC, 1 MG/DOSE,) 4 MG/3ML SOPN   dapagliflozin propanediol (FARXIGA) 10 MG TABS tablet   amLODipine (NORVASC) 5 MG tablet   Obesity, diabetes, and hypertension syndrome ([place]) - Primary    Well-controlled hypertension.  Continue amlodipine 5 mg daily. Uncontrolled diabetes with hemoglobin A1c of 7.6 Stop glipizide.  Increase Ozempic to 1 mg weekly and start Farxiga 10 mg daily. Diet and nutrition discussed. Follow-up in 3 months.      Relevant Medications   Semaglutide, 1 MG/DOSE, (OZEMPIC, 1 MG/DOSE,) 4 MG/3ML SOPN   dapagliflozin propanediol (FARXIGA) 10 MG TABS tablet   amLODipine (NORVASC) 5 MG tablet   Other Relevant Orders   POCT glycosylated hemoglobin (Hb A1C) (Completed)     Musculoskeletal and Integument   Statin myopathy   Other Visit Diagnoses     Stage 3a chronic kidney disease (HCC)       Relevant Medications   dapagliflozin propanediol (FARXIGA) 10 MG TABS tablet   Bilateral hip pain       Relevant Orders   DG HIPS BILAT W OR W/O PELVIS 2V      Patient Instructions  Diabetes Mellitus and Nutrition, Adult When you have diabetes, or diabetes mellitus, it is very important to have healthy eating habits because your blood sugar (glucose) levels are greatly affected by what you eat and  drink. Eating healthy foods in the right amounts, at about the same times every day, can help you: Manage your  blood glucose. Lower your risk of heart disease. Improve your blood pressure. Reach or maintain a healthy weight. What can affect my meal plan? Every person with diabetes is different, and each person has different needs for a meal plan. Your health care provider may recommend that you work with a dietitian to make a meal plan that is best for you. Your meal plan may vary depending on factors such as: The calories you need. The medicines you take. Your weight. Your blood glucose, blood pressure, and cholesterol levels. Your activity level. Other health conditions you have, such as heart or kidney disease. How do carbohydrates affect me? Carbohydrates, also called carbs, affect your blood glucose level more than any other type of food. Eating carbs raises the amount of glucose in your blood. It is important to know how many carbs you can safely have in each meal. This is different for every person. Your dietitian can help you calculate how many carbs you should have at each meal and for each snack. How does alcohol affect me? Alcohol can cause a decrease in blood glucose (hypoglycemia), especially if you use insulin or take certain diabetes medicines by mouth. Hypoglycemia can be a life-threatening condition. Symptoms of hypoglycemia, such as sleepiness, dizziness, and confusion, are similar to symptoms of having too much alcohol. Do not drink alcohol if: Your health care provider tells you not to drink. You are pregnant, may be pregnant, or are planning to become pregnant. If you drink alcohol: Limit how much you have to: 0-1 drink a day for women. 0-2 drinks a day for men. Know how much alcohol is in your drink. In the [place] one drink equals one 12 oz bottle of beer (355 mL), one 5 oz glass of wine (148 mL), or one 1 oz glass of hard liquor (44 mL). Keep yourself hydrated with water, diet soda, or unsweetened iced tea. Keep in mind that regular soda, juice, and other mixers may contain a  lot of sugar and must be counted as carbs. What are tips for following this plan? Reading food labels Start by checking the serving size on the Nutrition Facts label of packaged foods and drinks. The number of calories and the amount of carbs, fats, and other nutrients listed on the label are based on one serving of the item. Many items contain more than one serving per package. Check the total grams (g) of carbs in one serving. Check the number of grams of saturated fats and trans fats in one serving. Choose foods that have a low amount or none of these fats. Check the number of milligrams (mg) of salt (sodium) in one serving. Most people should limit total sodium intake to less than 2,300 mg per day. Always check the nutrition information of foods labeled as "low-fat" or "nonfat." These foods may be higher in added sugar or refined carbs and should be avoided. Talk to your dietitian to identify your daily goals for nutrients listed on the label. Shopping Avoid buying canned, pre-made, or processed foods. These foods tend to be high in fat, sodium, and added sugar. Shop around the outside edge of the grocery store. This is where you will most often find fresh fruits and vegetables, bulk grains, fresh meats, and fresh dairy products. Cooking Use low-heat cooking methods, such as baking, instead of high-heat cooking methods, such as deep frying. Cook using  healthy oils, such as olive, canola, or sunflower oil. Avoid cooking with butter, cream, or high-fat meats. Meal planning Eat meals and snacks regularly, preferably at the same times every day. Avoid going long periods of time without eating. Eat foods that are high in fiber, such as fresh fruits, vegetables, beans, and whole grains. Eat 4-6 oz (112-168 g) of lean protein each day, such as lean meat, chicken, fish, eggs, or tofu. One ounce (oz) (28 g) of lean protein is equal to: 1 oz (28 g) of meat, chicken, or fish. 1 egg.  cup (62 g) of  tofu. Eat some foods each day that contain healthy fats, such as avocado, nuts, seeds, and fish. What foods should I eat? Fruits Berries. Apples. Oranges. Peaches. Apricots. Plums. Grapes. Mangoes. Papayas. Pomegranates. Kiwi. Cherries. Vegetables Leafy greens, including lettuce, spinach, kale, chard, collard greens, mustard greens, and cabbage. Beets. Cauliflower. Broccoli. Carrots. Green beans. Tomatoes. Peppers. Onions. Cucumbers. Brussels sprouts. Grains Whole grains, such as whole-wheat or whole-grain bread, crackers, tortillas, cereal, and pasta. Unsweetened oatmeal. Quinoa. Brown or wild rice. Meats and other proteins Seafood. Poultry without skin. Lean cuts of poultry and beef. Tofu. Nuts. Seeds. Dairy Low-fat or fat-free dairy products such as milk, yogurt, and cheese. The items listed above may not be a complete list of foods and beverages you can eat and drink. Contact a dietitian for more information. What foods should I avoid? Fruits Fruits canned with syrup. Vegetables Canned vegetables. Frozen vegetables with butter or cream sauce. Grains Refined white flour and flour products such as bread, pasta, snack foods, and cereals. Avoid all processed foods. Meats and other proteins Fatty cuts of meat. Poultry with skin. Breaded or fried meats. Processed meat. Avoid saturated fats. Dairy Full-fat yogurt, cheese, or milk. Beverages Sweetened drinks, such as soda or iced tea. The items listed above may not be a complete list of foods and beverages you should avoid. Contact a dietitian for more information. Questions to ask a health care provider Do I need to meet with a certified diabetes care and education specialist? Do I need to meet with a dietitian? What number can I call if I have questions? When are the best times to check my blood glucose? Where to find more information: American Diabetes Association: diabetes.org Academy of Nutrition and Dietetics:  eatright.[employer] of Diabetes and Digestive and Kidney Diseases: [employer]is Association of Diabetes Care & Education Specialists: diabeteseducator.org Summary It is important to have healthy eating habits because your blood sugar (glucose) levels are greatly affected by what you eat and drink. It is important to use alcohol carefully. A healthy meal plan will help you manage your blood glucose and lower your risk of heart disease. Your health care provider may recommend that you work with a dietitian to make a meal plan that is best for you. This information is not intended to replace advice given to you by your health care provider. Make sure you discuss any questions you have with your health care provider. Document Revised: 03/29/2020 Document Reviewed: 03/29/2020 Elsevier Patient Education  2022 [place], [place] [facility]

## 2021-11-16 ENCOUNTER — Ambulatory Visit: Payer: 59 | Admitting: Sports Medicine

## 2021-11-16 ENCOUNTER — Ambulatory Visit: Payer: No Typology Code available for payment source | Admitting: Family Medicine

## 2021-11-16 VITALS — BP 131/89 | Ht 66.0 in | Wt 240.0 lb

## 2021-11-16 DIAGNOSIS — M79605 Pain in left leg: Secondary | ICD-10-CM | POA: Diagnosis not present

## 2021-11-16 DIAGNOSIS — M79604 Pain in right leg: Secondary | ICD-10-CM

## 2021-11-16 DIAGNOSIS — G5793 Unspecified mononeuropathy of bilateral lower limbs: Secondary | ICD-10-CM | POA: Diagnosis not present

## 2021-11-16 MED ORDER — VITAMIN B-6 50 MG PO TABS: 50.0000 mg | ORAL_TABLET | Freq: Every day | ORAL | 1 refills | Status: AC

## 2021-11-16 MED ORDER — GABAPENTIN 300 MG PO CAPS: 300.0000 mg | ORAL_CAPSULE | Freq: Three times a day (TID) | ORAL | 0 refills | Status: DC

## 2021-11-16 NOTE — Patient Instructions (Addendum)
Ms Kmya Placide, ? ?It was a pleasure seeing you in clinic. Today we discussed:  ? ?Bilateral leg pain: ?- Start taking gabapentin '300mg'$  three times daily and vitamin B6 '50mg'$  daily  ?- Continue with home stretching exercises as provided in the last visit ?- Follow up with your PCP to check for peripheral artery disease --> if they have not done, would consider Doppler ABI study likely first versus CT angiogram ?- Follow up in 4 weeks  ? ?Thank you, we look forward to helping you remain healthy! ? ? ?Elba Barman, DO ?[place] ? ?

## 2021-11-16 NOTE — Progress Notes (Signed)
PCP: Horald Pollen, MD ? ?Subjective:  ? ?HPI: ?Patient is a 61 y.o. female here for persistent bilateral leg pain for one year duration.  ? ?09/19/2021: ?Patient reports she's had proximal bilateral leg pain for at least 3 months. ?No acute injury or trauma. ?She did not start any new medications prior to this. ?Associated spasms of her legs as well. ?Has trouble walking as a result of this. ?Cramping is worse at nighttime. ?Electrolytes test in November were normal. ? ?11/16/2021: ?Patient is following up today from her last visit with persistent bilateral leg pain. She notes that this is crampy in nature and is radiating down her bilateral legs. She notes that this pain is with standing for greater than 10 minutes, walking for extended distances and also notes that it is worse at night. At night, she reports feeling a sense of heaviness along with the cramping in bilateral legs. This is improved with stretching exercises. She denies any burning or numbness/tingling. She denies any weakness in her lower extremities.  ? ? ?Past Medical History:  ?Diagnosis Date  ? Anemia   ? Arthritis   ? bilateral kneed and lower legs  ? Clotting disorder ([place])   ? Diabetes mellitus   ? recent high blood sugar and has RX but not taking  med -b/c she doesn't think she  is diabetic, just had lots of sugar in her diet when dx'd.-on meds  ? Hx of adenomatous polyp of colon 07/2021  ? Repeat colonoscopy 2029  ? Hypertension   ? on meds  ? Shortness of breath   ? on exertion- has low hgb  ? Sickle cell trait ([place])   ? ? ?Current Outpatient Medications on File Prior to Visit  ?Medication Sig Dispense Refill  ? amLODipine (NORVASC) 5 MG tablet Take 1 tablet (5 mg total) by mouth daily. 90 tablet 3  ? Ascorbic Acid (VITAMIN C) 1000 MG tablet Take 1,000 mg by mouth 2 (two) times a week.    ? aspirin 81 MG EC tablet Take 1 tablet (81 mg total) by mouth daily. 90 tablet 3  ? blood glucose meter kit and supplies Per insurance preference.  Check daily fasting blood glucose. (Dx. E11.9). 1 each 11  ? dapagliflozin propanediol (FARXIGA) 10 MG TABS tablet Take 1 tablet (10 mg total) by mouth daily before breakfast. 90 tablet 3  ? Omega-3 Fatty Acids (OMEGA-3 FISH OIL PO) Take 1 capsule by mouth in the morning.    ? Semaglutide, 1 MG/DOSE, (OZEMPIC, 1 MG/DOSE,) 4 MG/3ML SOPN Inject 1 mg into the skin once a week. 3 mL 5  ? [DISCONTINUED] gabapentin (NEURONTIN) 300 MG capsule Take 300 mg by mouth 3 (three) times daily. 1 cap 1st week, 2 caps 2 wks, 3 caps 3 wks, continue. ?Different Provider.    ? ?No current facility-administered medications on file prior to visit.  ? ? ?Past Surgical History:  ?Procedure Laterality Date  ? ABDOMINAL HYSTERECTOMY  09/11/2011  ? Procedure: HYSTERECTOMY ABDOMINAL;  Surgeon: Frederico Hamman, MD;  Location: [facility];  Service: Gynecology;  Laterality: N/A;  ? COLONOSCOPY WITH PROPOFOL N/A 07/16/2021  ? Procedure: COLONOSCOPY WITH PROPOFOL;  Surgeon: Gatha Mayer, MD;  Location: [facility];  Service: Endoscopy;  Laterality: N/A;  ? POLYPECTOMY  07/16/2021  ? Procedure: POLYPECTOMY;  Surgeon: Gatha Mayer, MD;  Location: [facility];  Service: Endoscopy;;  ? ? ?Allergies  ?Allergen Reactions  ? Lisinopril Other (See Comments)  ?  Did not feel  good  ? Losartan Other (See Comments)  ?  headache  ? Aspirin Other (See Comments)  ?  Abdominal pain only 325 mg   ? ? ?BP 131/89   Ht _0  (1.676 m)   Wt 240 lb (108.9 kg)   LMP 09/10/2011   BMI 38.74 kg/m?  ? ?No flowsheet data found. ? ?No flowsheet data found. ? ?    ?Objective:  ?Physical Exam: ? ?Gen: NAD, comfortable in exam room ?Lumbar spine:  ?- Inspection: no gross deformity or asymmetry, swelling or ecchymosis ?- Palpation: No TTP over the spinous processes, paraspinal muscles, or SI joints b/l ?- ROM: full active ROM of the lumbar spine in flexion and extension without pain ?- Strength: 5/5 strength of lower extremity in L4-S1 nerve root distributions b/l; noted to  have outward deviation of bilateral feet with walking ?- Neuro: sensation intact in the L4-S1 nerve root distribution b/l, 2+ L4 and S1 reflexes ?- Special testing: Positive straight leg raise bilaterally, Positive FABER test bilaterally. Negative stork test bilaterally  ?Hip:  ?- Inspection: No gross deformity, no swelling, erythema, or ecchymosis ?- Palpation: No TTP, specifically none over greater trochanter ?- ROM: Normal range of motion on Flexion, extension, abduction, internal and external rotation; pain reproducible on internal and external rotation ?- Strength: Normal strength. ?- Neuro/vasc: NV intact distally ? ?Assessment & Plan:  ?1. Chronic bilateral leg pain ?Patient is presenting with one year history of persistent bilateral leg pain that is crampy in nature. She was evaluated for this six weeks prior and recommended for physical therapy. She reports ongoing crampy leg pain that is worse with prolonged standing, walking and worse at night. She does report improvement of the pain with stretching exercises provided. On examination, patient has positive straight leg raise, FABER test bilaterally. She does have a history of type II diabetes mellitus that was previously uncontrolled; suspect she could have developed some neuropathy as a result of this. Will treat patient for neurogenic neuropathy at this time with gabapentin 350m tid and vitamin B6 548mdaily.  ?Will also have her follow up with her PCP to evaluate for peripheral artery disease, consider ABI's and possible CT Angio of bilateral lower extremities ?Follow up in 4 weeks ? ?SaHarvie HeckMD ?Internal Medicine, PGY-3 ?11/16/21 12:27 PM ? ?Sports Medicine Fellow Addendum: ?  ?I have independently interviewed and examined the patient. I have discussed the above with the original author and agree with their documentation. My edits for correction/addition/clarification have been made, see any changes above and below. Any attestation will be made  below from the attending.  ? ?In summary, [age]ear-old female who presents with ongoing bilateral cramping leg pain.  Does get pain throughout the day, but is worse at night when she is lying still.  Will improve when she moves or stretches the legs.  Her physical examination is benign for the bony or joint pathology today.  Her story seems most indicative of neurogenic neuropathy.  She does have a history of diabetes previously uncontrolled.  We will treat with titration of gabapentin starting at 100 mg 3 times daily up to 300 mg 3 times daily until follow-up.  She will also begin vitamin B6 50 mg daily.  She did have palpable pulses, although I did discuss given the ongoing nature of her issue following up with her primary care physician to see if there is a need for evaluation of peripheral arterial disease likely with consideration of ABIs, etc. Patient will  follow-up in 4 weeks. ? ?Elba Barman, DO ?PGY-4, Sports Medicine Fellow ?[place] ? ?Addendum:  I was the preceptor for this visit and available for immediate consultation.  Karlton Lemon MD CAQSM ? ?

## 2021-11-26 ENCOUNTER — Telehealth: Payer: Self-pay | Admitting: Emergency Medicine

## 2021-11-26 ENCOUNTER — Other Ambulatory Visit: Payer: Self-pay | Admitting: Emergency Medicine

## 2021-11-26 NOTE — Telephone Encounter (Signed)
Pt requesting medication to travel- to help with Malaria. States she does not know the name. Pt also needs refill on: ? ?Semaglutide, 1 MG/DOSE, (OZEMPIC, 1 MG/DOSE,) 4 MG/3ML SOPN for two months.   ? ?[id number] - [place], [place] AT [facility] [address] [place] Phone:  [phone]  ?Fax:  [phone]  ?  ? ?

## 2021-11-26 NOTE — Telephone Encounter (Signed)
How many days will she be spending in [place]?

## 2021-11-27 ENCOUNTER — Other Ambulatory Visit: Payer: Self-pay | Admitting: Emergency Medicine

## 2021-11-27 DIAGNOSIS — Z298 Encounter for other specified prophylactic measures: Secondary | ICD-10-CM

## 2021-11-27 MED ORDER — ATOVAQUONE-PROGUANIL HCL 250-100 MG PO TABS: 1.0000 | ORAL_TABLET | Freq: Every day | ORAL | 1 refills | Status: DC

## 2021-11-27 NOTE — Telephone Encounter (Signed)
Prescription sent to pharmacy of record.  Thanks.

## 2021-11-27 NOTE — Telephone Encounter (Signed)
Patient notified

## 2022-01-14 NOTE — Telephone Encounter (Signed)
NA

## 2022-02-01 ENCOUNTER — Telehealth: Payer: Self-pay | Admitting: *Deleted

## 2022-02-01 NOTE — Telephone Encounter (Signed)
PA renewal for Ozempic submitted, awaiting response  Key: [id number]

## 2022-02-01 NOTE — Telephone Encounter (Signed)
PA for Ozempic is not required at this time

## 2022-03-19 ENCOUNTER — Encounter: Payer: Self-pay | Admitting: Emergency Medicine

## 2022-03-19 ENCOUNTER — Ambulatory Visit (INDEPENDENT_AMBULATORY_CARE_PROVIDER_SITE_OTHER): Payer: 59 | Admitting: Emergency Medicine

## 2022-03-19 VITALS — BP 132/84 | HR 74 | Temp 98.9°F | Ht 66.0 in | Wt 227.1 lb

## 2022-03-19 DIAGNOSIS — E1165 Type 2 diabetes mellitus with hyperglycemia: Secondary | ICD-10-CM | POA: Diagnosis not present

## 2022-03-19 DIAGNOSIS — E1159 Type 2 diabetes mellitus with other circulatory complications: Secondary | ICD-10-CM | POA: Diagnosis not present

## 2022-03-19 DIAGNOSIS — E1169 Type 2 diabetes mellitus with other specified complication: Secondary | ICD-10-CM | POA: Diagnosis not present

## 2022-03-19 DIAGNOSIS — E669 Obesity, unspecified: Secondary | ICD-10-CM

## 2022-03-19 DIAGNOSIS — E119 Type 2 diabetes mellitus without complications: Secondary | ICD-10-CM

## 2022-03-19 DIAGNOSIS — R079 Chest pain, unspecified: Secondary | ICD-10-CM | POA: Diagnosis not present

## 2022-03-19 DIAGNOSIS — G720 Drug-induced myopathy: Secondary | ICD-10-CM

## 2022-03-19 DIAGNOSIS — I152 Hypertension secondary to endocrine disorders: Secondary | ICD-10-CM

## 2022-03-19 DIAGNOSIS — T466X5A Adverse effect of antihyperlipidemic and antiarteriosclerotic drugs, initial encounter: Secondary | ICD-10-CM

## 2022-03-19 LAB — POCT GLYCOSYLATED HEMOGLOBIN (HGB A1C): Hemoglobin A1C: 10.1 % — AB (ref 4.0–5.6)

## 2022-03-19 MED ORDER — BLOOD GLUCOSE METER KIT: PACK | 11 refills | Status: DC

## 2022-03-19 MED ORDER — AMLODIPINE BESYLATE 5 MG PO TABS: 5.0000 mg | ORAL_TABLET | Freq: Every day | ORAL | 3 refills | Status: DC

## 2022-03-19 NOTE — Patient Instructions (Signed)
Continue Ozempic and [place]. Start glipizide 5 mg twice a day Cardiology evaluation Follow-up in 3 months.  Diabetes Mellitus and Nutrition, Adult When you have diabetes, or diabetes mellitus, it is very important to have healthy eating habits because your blood sugar (glucose) levels are greatly affected by what you eat and drink. Eating healthy foods in the right amounts, at about the same times every day, can help you: Manage your blood glucose. Lower your risk of heart disease. Improve your blood pressure. Reach or maintain a healthy weight. What can affect my meal plan? Every person with diabetes is different, and each person has different needs for a meal plan. Your health care provider may recommend that you work with a dietitian to make a meal plan that is best for you. Your meal plan may vary depending on factors such as: The calories you need. The medicines you take. Your weight. Your blood glucose, blood pressure, and cholesterol levels. Your activity level. Other health conditions you have, such as heart or kidney disease. How do carbohydrates affect me? Carbohydrates, also called carbs, affect your blood glucose level more than any other type of food. Eating carbs raises the amount of glucose in your blood. It is important to know how many carbs you can safely have in each meal. This is different for every person. Your dietitian can help you calculate how many carbs you should have at each meal and for each snack. How does alcohol affect me? Alcohol can cause a decrease in blood glucose (hypoglycemia), especially if you use insulin or take certain diabetes medicines by mouth. Hypoglycemia can be a life-threatening condition. Symptoms of hypoglycemia, such as sleepiness, dizziness, and confusion, are similar to symptoms of having too much alcohol. Do not drink alcohol if: Your health care provider tells you not to drink. You are pregnant, may be pregnant, or are planning to  become pregnant. If you drink alcohol: Limit how much you have to: 0-1 drink a day for women. 0-2 drinks a day for men. Know how much alcohol is in your drink. In the [place] one drink equals one 12 oz bottle of beer (355 mL), one 5 oz glass of wine (148 mL), or one 1 oz glass of hard liquor (44 mL). Keep yourself hydrated with water, diet soda, or unsweetened iced tea. Keep in mind that regular soda, juice, and other mixers may contain a lot of sugar and must be counted as carbs. What are tips for following this plan?  Reading food labels Start by checking the serving size on the Nutrition Facts label of packaged foods and drinks. The number of calories and the amount of carbs, fats, and other nutrients listed on the label are based on one serving of the item. Many items contain more than one serving per package. Check the total grams (g) of carbs in one serving. Check the number of grams of saturated fats and trans fats in one serving. Choose foods that have a low amount or none of these fats. Check the number of milligrams (mg) of salt (sodium) in one serving. Most people should limit total sodium intake to less than 2,300 mg per day. Always check the nutrition information of foods labeled as "low-fat" or "nonfat." These foods may be higher in added sugar or refined carbs and should be avoided. Talk to your dietitian to identify your daily goals for nutrients listed on the label. Shopping Avoid buying canned, pre-made, or processed foods. These foods tend to be high in  fat, sodium, and added sugar. Shop around the outside edge of the grocery store. This is where you will most often find fresh fruits and vegetables, bulk grains, fresh meats, and fresh dairy products. Cooking Use low-heat cooking methods, such as baking, instead of high-heat cooking methods, such as deep frying. Cook using healthy oils, such as olive, canola, or sunflower oil. Avoid cooking with butter, cream, or high-fat  meats. Meal planning Eat meals and snacks regularly, preferably at the same times every day. Avoid going long periods of time without eating. Eat foods that are high in fiber, such as fresh fruits, vegetables, beans, and whole grains. Eat 4-6 oz (112-168 g) of lean protein each day, such as lean meat, chicken, fish, eggs, or tofu. One ounce (oz) (28 g) of lean protein is equal to: 1 oz (28 g) of meat, chicken, or fish. 1 egg.  cup (62 g) of tofu. Eat some foods each day that contain healthy fats, such as avocado, nuts, seeds, and fish. What foods should I eat? Fruits Berries. Apples. Oranges. Peaches. Apricots. Plums. Grapes. Mangoes. Papayas. Pomegranates. Kiwi. Cherries. Vegetables Leafy greens, including lettuce, spinach, kale, chard, collard greens, mustard greens, and cabbage. Beets. Cauliflower. Broccoli. Carrots. Green beans. Tomatoes. Peppers. Onions. Cucumbers. Brussels sprouts. Grains Whole grains, such as whole-wheat or whole-grain bread, crackers, tortillas, cereal, and pasta. Unsweetened oatmeal. Quinoa. Brown or wild rice. Meats and other proteins Seafood. Poultry without skin. Lean cuts of poultry and beef. Tofu. Nuts. Seeds. Dairy Low-fat or fat-free dairy products such as milk, yogurt, and cheese. The items listed above may not be a complete list of foods and beverages you can eat and drink. Contact a dietitian for more information. What foods should I avoid? Fruits Fruits canned with syrup. Vegetables Canned vegetables. Frozen vegetables with butter or cream sauce. Grains Refined white flour and flour products such as bread, pasta, snack foods, and cereals. Avoid all processed foods. Meats and other proteins Fatty cuts of meat. Poultry with skin. Breaded or fried meats. Processed meat. Avoid saturated fats. Dairy Full-fat yogurt, cheese, or milk. Beverages Sweetened drinks, such as soda or iced tea. The items listed above may not be a complete list of foods and  beverages you should avoid. Contact a dietitian for more information. Questions to ask a health care provider Do I need to meet with a certified diabetes care and education specialist? Do I need to meet with a dietitian? What number can I call if I have questions? When are the best times to check my blood glucose? Where to find more information: American Diabetes Association: diabetes.org Academy of Nutrition and Dietetics: eatright.[employer] of Diabetes and Digestive and Kidney Diseases: [employer] Association of Diabetes Care & Education Specialists: diabeteseducator.org Summary It is important to have healthy eating habits because your blood sugar (glucose) levels are greatly affected by what you eat and drink. It is important to use alcohol carefully. A healthy meal plan will help you manage your blood glucose and lower your risk of heart disease. Your health care provider may recommend that you work with a dietitian to make a meal plan that is best for you. This information is not intended to replace advice given to you by your health care provider. Make sure you discuss any questions you have with your health care provider. Document Revised: 03/29/2020 Document Reviewed: 03/29/2020 Elsevier Patient Education  [place].

## 2022-03-19 NOTE — Assessment & Plan Note (Signed)
Differential diagnosis discussed.  Chest pain on exertion.  Most likely angina. Continue daily baby aspirin.  Diabetic and hypertensive.  Most likely has coronary artery disease.  Will need stress test. EKG shows left atrial enlargement.  Will need echocardiogram. Cardiology referral placed today.

## 2022-03-19 NOTE — Progress Notes (Signed)
Lindsay Travis 61 y.o.   Chief Complaint  Patient presents with   Diabetes   Hip Pain    Left hip pain     HISTORY OF PRESENT ILLNESS: This is a 61 y.o. female diabetic and hypertensive here for 76-monthfollow-up. Presently on Ozempic 1 mg weekly and Farxiga 10 mg daily. Still complaining of chronic pain to both hips Also complaining of occasional midsternal pain mostly on exertion and lasting a couple minutes.  Relieved by rest.  Diabetes Pertinent negatives for hypoglycemia include no dizziness or headaches. Associated symptoms include chest pain.  Hip Pain     Prior to Admission medications   Medication Sig Start Date End Date Taking? Authorizing Provider  Ascorbic Acid (VITAMIN C) 1000 MG tablet Take 1,000 mg by mouth 2 (two) times a week.   Yes [provider]  aspirin 81 MG EC tablet Take 1 tablet (81 mg total) by mouth daily. 03/20/18  Yes McCue, JJanett Billow NP  dapagliflozin propanediol (FARXIGA) 10 MG TABS tablet Take by mouth daily.   Yes [provider]  Omega-3 Fatty Acids (OMEGA-3 FISH OIL PO) Take 1 capsule by mouth in the morning.   Yes [provider]  Semaglutide, 1 MG/DOSE, (OZEMPIC, 1 MG/DOSE,) 4 MG/3ML SOPN Inject 1 mg into the skin once a week. 11/07/21  Yes Zakari Couchman, MInes Bloomer MD  amLODipine (NORVASC) 5 MG tablet Take 1 tablet (5 mg total) by mouth daily. 03/19/22 03/14/23  SHorald Pollen MD  atovaquone-proguanil (MALARONE) 250-100 MG TABS tablet Take 1 tablet by mouth daily. [place] 2 days before traveling[place] and continue for 1 week after returning. Patient not taking: Reported on 03/19/2022 11/27/21   SHorald Pollen MD  blood glucose meter kit and supplies Per insurance preference. Check daily fasting blood glucose. (Dx. E11.9). 03/19/22   SHorald Pollen MD  gabapentin (NEURONTIN) 300 MG capsule Take 1 capsule (300 mg total) by mouth 3 (three) times daily. 11/16/21 12/16/21  BElba Barman DO     Allergies  Allergen Reactions   Lisinopril Other (See Comments)    Did not feel good   Losartan Other (See Comments)    headache   Aspirin Other (See Comments)    Abdominal pain only 325 mg     Patient Active Problem List   Diagnosis Date Noted   Statin myopathy 08/01/2021   Obesity, diabetes, and hypertension syndrome ([place] 01/09/2021   Morbid obesity ([place] 09/18/2019   Cerebral thrombosis with cerebral infarction 01/10/2018   Dyslipidemia    Type 2 diabetes mellitus with hyperglycemia, without long-term current use of insulin ([place] 08/07/2015   Varicose veins of lower extremities with other complications 014/48/1856  Fibroid, uterus 04/18/2011   Hypertension associated with diabetes ([place] 04/18/2011    Past Medical History:  Diagnosis Date   Anemia    Arthritis    bilateral kneed and lower legs   Clotting disorder ([place]    Diabetes mellitus    recent high blood sugar and has RX but not taking  med -b/c she doesn't think she  is diabetic, just had lots of sugar in her diet when dx'd.-on meds   Hx of adenomatous polyp of colon 07/2021   Repeat colonoscopy 2029   Hypertension    on meds   Shortness of breath    on exertion- has low hgb   Sickle cell trait ([facility]     Past Surgical History:  Procedure Laterality Date   ABDOMINAL HYSTERECTOMY  09/11/2011  Procedure: HYSTERECTOMY ABDOMINAL;  Surgeon: Frederico Hamman, MD;  Location: [facility];  Service: Gynecology;  Laterality: N/A;   COLONOSCOPY WITH PROPOFOL N/A 07/16/2021   Procedure: COLONOSCOPY WITH PROPOFOL;  Surgeon: Gatha Mayer, MD;  Location: [facility];  Service: Endoscopy;  Laterality: N/A;   POLYPECTOMY  07/16/2021   Procedure: POLYPECTOMY;  Surgeon: Gatha Mayer, MD;  Location: [facility];  Service: Endoscopy;;    Social History   Socioeconomic History   Marital status: Divorced    Spouse name: Not on file   Number of children: Not on file   Years of education: Not on file   Highest  education level: Not on file  Occupational History   Not on file  Tobacco Use   Smoking status: Never   Smokeless tobacco: Never  Vaping Use   Vaping Use: Never used  Substance and Sexual Activity   Alcohol use: No   Drug use: No   Sexual activity: Never  Other Topics Concern   Not on file  Social History Narrative   Not on file   Social Determinants of Health   Financial Resource Strain: Not on file  Food Insecurity: Not on file  Transportation Needs: Not on file  Physical Activity: Not on file  Stress: Not on file  Social Connections: Not on file  Intimate Partner Violence: Not on file    Family History  Problem Relation Age of Onset   Hypertension Other    Colon polyps Neg Hx    Colon cancer Neg Hx    Heart disease Neg Hx    Esophageal cancer Neg Hx    Rectal cancer Neg Hx    Stomach cancer Neg Hx      Review of Systems  Constitutional: Negative.  Negative for fever.  HENT: Negative.  Negative for congestion and sore throat.   Respiratory: Negative.  Negative for cough and shortness of breath.   Cardiovascular:  Positive for chest pain. Negative for palpitations and leg swelling.  Gastrointestinal:  Negative for abdominal pain, diarrhea, nausea and vomiting.  Genitourinary: Negative.   Musculoskeletal:  Positive for joint pain (Bilateral hip pain).  Skin: Negative.  Negative for rash.  Neurological: Negative.  Negative for dizziness and headaches.  All other systems reviewed and are negative.   Today's Vitals   03/19/22 1308  BP: 132/84  Pulse: 74  Temp: 98.9 F (37.2 C)  TempSrc: Oral  SpO2: 95%  Weight: 227 lb 2 oz (103 kg)  Height: '5\' 6"'  (1.676 m)   Body mass index is 36.66 kg/m. Wt Readings from Last 3 Encounters:  03/19/22 227 lb 2 oz (103 kg)  11/16/21 240 lb (108.9 kg)  11/07/21 243 lb (110.2 kg)    Physical Exam Vitals reviewed.  Constitutional:      Appearance: Normal appearance.  HENT:     Head: Normocephalic.      Mouth/Throat:     Mouth: Mucous membranes are moist.     Pharynx: Oropharynx is clear.  Eyes:     Extraocular Movements: Extraocular movements intact.     Pupils: Pupils are equal, round, and reactive to light.  Cardiovascular:     Rate and Rhythm: Normal rate and regular rhythm.     Pulses: Normal pulses.     Heart sounds: Normal heart sounds.     Comments: Occasional extrasystoles Pulmonary:     Effort: Pulmonary effort is normal.     Breath sounds: Normal breath sounds.  Abdominal:  Palpations: Abdomen is soft.     Tenderness: There is no abdominal tenderness.  Musculoskeletal:     Cervical back: No tenderness.     Right lower leg: No edema.     Left lower leg: No edema.     Comments: Feet: Good distal circulation.  Excellent pulses and capillary refill  Lymphadenopathy:     Cervical: No cervical adenopathy.  Skin:    General: Skin is warm and dry.     Capillary Refill: Capillary refill takes less than 2 seconds.  Neurological:     General: No focal deficit present.     Mental Status: She is alert and oriented to person, place, and time.  Psychiatric:        Mood and Affect: Mood normal.        Behavior: Behavior normal.   Results for orders placed or performed in visit on 03/19/22 (from the past 24 hour(s))  POCT HgB A1C     Status: Abnormal   Collection Time: 03/19/22  1:21 PM  Result Value Ref Range   Hemoglobin A1C 10.1 (A) 4.0 - 5.6 %   HbA1c POC (<> result, manual entry)     HbA1c, POC (prediabetic range)     HbA1c, POC (controlled diabetic range)     EKG: Normal sinus rhythm with ventricular rate of 74/min.  No acute ischemic changes.  Possible left atrial enlargement.  ASSESSMENT & PLAN: Problem List Items Addressed This Visit       Cardiovascular and Mediastinum   Hypertension associated with diabetes ([place])    Well-controlled hypertension. BP Readings from Last 3 Encounters:  03/19/22 132/84  11/16/21 131/89  11/07/21 122/76  Continue amlodipine  5 mg daily. Uncontrolled diabetes.  See instructions below.       Relevant Medications   dapagliflozin propanediol (FARXIGA) 10 MG TABS tablet   amLODipine (NORVASC) 5 MG tablet   Obesity, diabetes, and hypertension syndrome ([place])    Successfully losing weight.  Continue semaglutide 1 mg weekly. Diet and nutrition discussed. Cardiovascular risk associated with uncontrolled diabetes and hypertension discussed.      Relevant Medications   dapagliflozin propanediol (FARXIGA) 10 MG TABS tablet   amLODipine (NORVASC) 5 MG tablet   Other Relevant Orders   Ambulatory referral to Cardiology     Endocrine   Type 2 diabetes mellitus with hyperglycemia, without long-term current use of insulin ([place]) - Primary    Uncontrolled diabetes with hemoglobin A1c at 10.1. Continue Ozempic 1 mg weekly on Farxiga 10 mg daily. Loosing weight.  Diet and nutrition discussed. Start glipizide 5 mg twice a day Hypoglycemia precautions given. Follow-up in 3 months.      Relevant Medications   dapagliflozin propanediol (FARXIGA) 10 MG TABS tablet   Other Relevant Orders   POCT HgB A1C (Completed)     Musculoskeletal and Integument   Statin myopathy     Other   Chest pain    Differential diagnosis discussed.  Chest pain on exertion.  Most likely angina. Continue daily baby aspirin.  Diabetic and hypertensive.  Most likely has coronary artery disease.  Will need stress test. EKG shows left atrial enlargement.  Will need echocardiogram. Cardiology referral placed today.      Relevant Orders   EKG 12-Lead   Ambulatory referral to Cardiology   Patient Instructions  Continue [place] and [place]. Start glipizide 5 mg twice a day Cardiology evaluation Follow-up in 3 months.  Diabetes Mellitus and Nutrition, Adult When you have diabetes, or diabetes mellitus,  it is very important to have healthy eating habits because your blood sugar (glucose) levels are greatly affected by what you eat and drink.  Eating healthy foods in the right amounts, at about the same times every day, can help you: Manage your blood glucose. Lower your risk of heart disease. Improve your blood pressure. Reach or maintain a healthy weight. What can affect my meal plan? Every person with diabetes is different, and each person has different needs for a meal plan. Your health care provider may recommend that you work with a dietitian to make a meal plan that is best for you. Your meal plan may vary depending on factors such as: The calories you need. The medicines you take. Your weight. Your blood glucose, blood pressure, and cholesterol levels. Your activity level. Other health conditions you have, such as heart or kidney disease. How do carbohydrates affect me? Carbohydrates, also called carbs, affect your blood glucose level more than any other type of food. Eating carbs raises the amount of glucose in your blood. It is important to know how many carbs you can safely have in each meal. This is different for every person. Your dietitian can help you calculate how many carbs you should have at each meal and for each snack. How does alcohol affect me? Alcohol can cause a decrease in blood glucose (hypoglycemia), especially if you use insulin or take certain diabetes medicines by mouth. Hypoglycemia can be a life-threatening condition. Symptoms of hypoglycemia, such as sleepiness, dizziness, and confusion, are similar to symptoms of having too much alcohol. Do not drink alcohol if: Your health care provider tells you not to drink. You are pregnant, may be pregnant, or are planning to become pregnant. If you drink alcohol: Limit how much you have to: 0-1 drink a day for women. 0-2 drinks a day for men. Know how much alcohol is in your drink. In the [place] one drink equals one 12 oz bottle of beer (355 mL), one 5 oz glass of wine (148 mL), or one 1 oz glass of hard liquor (44 mL). Keep yourself hydrated with water,  diet soda, or unsweetened iced tea. Keep in mind that regular soda, juice, and other mixers may contain a lot of sugar and must be counted as carbs. What are tips for following this plan?  Reading food labels Start by checking the serving size on the Nutrition Facts label of packaged foods and drinks. The number of calories and the amount of carbs, fats, and other nutrients listed on the label are based on one serving of the item. Many items contain more than one serving per package. Check the total grams (g) of carbs in one serving. Check the number of grams of saturated fats and trans fats in one serving. Choose foods that have a low amount or none of these fats. Check the number of milligrams (mg) of salt (sodium) in one serving. Most people should limit total sodium intake to less than 2,300 mg per day. Always check the nutrition information of foods labeled as "low-fat" or "nonfat." These foods may be higher in added sugar or refined carbs and should be avoided. Talk to your dietitian to identify your daily goals for nutrients listed on the label. Shopping Avoid buying canned, pre-made, or processed foods. These foods tend to be high in fat, sodium, and added sugar. Shop around the outside edge of the grocery store. This is where you will most often find fresh fruits and vegetables, bulk grains, fresh  meats, and fresh dairy products. Cooking Use low-heat cooking methods, such as baking, instead of high-heat cooking methods, such as deep frying. Cook using healthy oils, such as olive, canola, or sunflower oil. Avoid cooking with butter, cream, or high-fat meats. Meal planning Eat meals and snacks regularly, preferably at the same times every day. Avoid going long periods of time without eating. Eat foods that are high in fiber, such as fresh fruits, vegetables, beans, and whole grains. Eat 4-6 oz (112-168 g) of lean protein each day, such as lean meat, chicken, fish, eggs, or tofu. One ounce  (oz) (28 g) of lean protein is equal to: 1 oz (28 g) of meat, chicken, or fish. 1 egg.  cup (62 g) of tofu. Eat some foods each day that contain healthy fats, such as avocado, nuts, seeds, and fish. What foods should I eat? Fruits Berries. Apples. Oranges. Peaches. Apricots. Plums. Grapes. Mangoes. Papayas. Pomegranates. Kiwi. Cherries. Vegetables Leafy greens, including lettuce, spinach, kale, chard, collard greens, mustard greens, and cabbage. Beets. Cauliflower. Broccoli. Carrots. Green beans. Tomatoes. Peppers. Onions. Cucumbers. Brussels sprouts. Grains Whole grains, such as whole-wheat or whole-grain bread, crackers, tortillas, cereal, and pasta. Unsweetened oatmeal. Quinoa. Brown or wild rice. Meats and other proteins Seafood. Poultry without skin. Lean cuts of poultry and beef. Tofu. Nuts. Seeds. Dairy Low-fat or fat-free dairy products such as milk, yogurt, and cheese. The items listed above may not be a complete list of foods and beverages you can eat and drink. Contact a dietitian for more information. What foods should I avoid? Fruits Fruits canned with syrup. Vegetables Canned vegetables. Frozen vegetables with butter or cream sauce. Grains Refined white flour and flour products such as bread, pasta, snack foods, and cereals. Avoid all processed foods. Meats and other proteins Fatty cuts of meat. Poultry with skin. Breaded or fried meats. Processed meat. Avoid saturated fats. Dairy Full-fat yogurt, cheese, or milk. Beverages Sweetened drinks, such as soda or iced tea. The items listed above may not be a complete list of foods and beverages you should avoid. Contact a dietitian for more information. Questions to ask a health care provider Do I need to meet with a certified diabetes care and education specialist? Do I need to meet with a dietitian? What number can I call if I have questions? When are the best times to check my blood glucose? Where to find more  information: American Diabetes Association: diabetes.org Academy of Nutrition and Dietetics: eatright.[employer] of Diabetes and Digestive and Kidney Diseases: AmenCredit.is Association of Diabetes Care & Education Specialists: diabeteseducator.org Summary It is important to have healthy eating habits because your blood sugar (glucose) levels are greatly affected by what you eat and drink. It is important to use alcohol carefully. A healthy meal plan will help you manage your blood glucose and lower your risk of heart disease. Your health care provider may recommend that you work with a dietitian to make a meal plan that is best for you. This information is not intended to replace advice given to you by your health care provider. Make sure you discuss any questions you have with your health care provider. Document Revised: 03/29/2020 Document Reviewed: 03/29/2020 Elsevier Patient Education  [place], MD [facility]

## 2022-03-19 NOTE — Assessment & Plan Note (Signed)
Well-controlled hypertension. BP Readings from Last 3 Encounters:  03/19/22 132/84  11/16/21 131/89  11/07/21 122/76  Continue amlodipine 5 mg daily. Uncontrolled diabetes.  See instructions below.

## 2022-03-19 NOTE — Assessment & Plan Note (Signed)
Successfully losing weight.  Continue semaglutide 1 mg weekly. Diet and nutrition discussed. Cardiovascular risk associated with uncontrolled diabetes and hypertension discussed.

## 2022-03-19 NOTE — Assessment & Plan Note (Signed)
Uncontrolled diabetes with hemoglobin A1c at 10.1. Continue Ozempic 1 mg weekly on Farxiga 10 mg daily. Loosing weight.  Diet and nutrition discussed. Start glipizide 5 mg twice a day Hypoglycemia precautions given. Follow-up in 3 months.

## 2022-03-20 ENCOUNTER — Other Ambulatory Visit: Payer: Self-pay | Admitting: *Deleted

## 2022-03-20 ENCOUNTER — Encounter: Payer: Self-pay | Admitting: Physician Assistant

## 2022-03-20 ENCOUNTER — Other Ambulatory Visit: Payer: Self-pay | Admitting: Emergency Medicine

## 2022-03-20 ENCOUNTER — Telehealth: Payer: Self-pay | Admitting: Emergency Medicine

## 2022-03-20 ENCOUNTER — Ambulatory Visit: Payer: 59 | Admitting: Physician Assistant

## 2022-03-20 VITALS — BP 132/82 | HR 87 | Ht 66.0 in | Wt 228.2 lb

## 2022-03-20 DIAGNOSIS — R079 Chest pain, unspecified: Secondary | ICD-10-CM

## 2022-03-20 DIAGNOSIS — E669 Obesity, unspecified: Secondary | ICD-10-CM

## 2022-03-20 DIAGNOSIS — I10 Essential (primary) hypertension: Secondary | ICD-10-CM | POA: Diagnosis not present

## 2022-03-20 DIAGNOSIS — E1165 Type 2 diabetes mellitus with hyperglycemia: Secondary | ICD-10-CM

## 2022-03-20 DIAGNOSIS — I6330 Cerebral infarction due to thrombosis of unspecified cerebral artery: Secondary | ICD-10-CM

## 2022-03-20 DIAGNOSIS — E785 Hyperlipidemia, unspecified: Secondary | ICD-10-CM | POA: Diagnosis not present

## 2022-03-20 DIAGNOSIS — E1169 Type 2 diabetes mellitus with other specified complication: Secondary | ICD-10-CM

## 2022-03-20 DIAGNOSIS — I152 Hypertension secondary to endocrine disorders: Secondary | ICD-10-CM

## 2022-03-20 DIAGNOSIS — E1159 Type 2 diabetes mellitus with other circulatory complications: Secondary | ICD-10-CM

## 2022-03-20 MED ORDER — GLIPIZIDE 5 MG PO TABS: 5.0000 mg | ORAL_TABLET | Freq: Two times a day (BID) | ORAL | 3 refills | Status: DC

## 2022-03-20 MED ORDER — BLOOD GLUCOSE METER KIT: PACK | 11 refills | Status: AC

## 2022-03-20 MED ORDER — PRAVASTATIN SODIUM 20 MG PO TABS: 20.0000 mg | ORAL_TABLET | Freq: Every evening | ORAL | 3 refills | Status: DC

## 2022-03-20 NOTE — Patient Instructions (Signed)
Medication Instructions:  Your physician has recommended you make the following change in your medication:   START: Pravastatin '20mg'$  daily  *If you need a refill on your cardiac medications before your next appointment, please call your pharmacy*   Lab Work: Your physician recommends that you return for a FASTING lipid profile on   If you have labs (blood work) drawn today and your tests are completely normal, you will receive your results only by: [place] (if you have MyChart) OR A paper copy in the mail If you have any lab test that is abnormal or we need to change your treatment, we will call you to review the results.   Testing/Procedures: Your physician has requested that you have an echocardiogram. Echocardiography is a painless test that uses sound waves to create images of your heart. It provides your doctor with information about the size and shape of your heart and how well your heart's chambers and valves are working. This procedure takes approximately one hour. There are no restrictions for this procedure.  Your physician has requested that you have a lexiscan myoview. For further information please visit HugeFiesta.[email or web address]. Please follow instruction sheet, as given.   Follow-Up: At [facility], you and your health needs are our priority.  As part of our continuing mission to provide you with exceptional heart care, we have created designated Provider Care Teams.  These Care Teams include your primary Cardiologist (physician) and Advanced Practice Providers (APPs -  Physician Assistants and Nurse Practitioners) who all work together to provide you with the care you need, when you need it.  We recommend signing up for the patient portal called "MyChart".  Sign up information is provided on this After Visit Summary.  MyChart is used to connect with patients for Virtual Visits (Telemedicine).  Patients are able to view lab/test results, encounter notes, upcoming  appointments, etc.  Non-urgent messages can be sent to your provider as well.   To learn more about what you can do with MyChart, go to [email or web address].    Your next appointment:   2 month(s)  The format for your next appointment:   In Person  Provider:   Jenkins Rouge, MD {

## 2022-03-20 NOTE — Progress Notes (Signed)
Cardiology Office Note    Date:  03/20/2022   ID:  [name], DOB 1960/09/22, MRN [id number]   PCP:  Horald Pollen, [place]  Cardiologist:  Jenkins Rouge, MD   Advanced Practice Provider:  No care team member to display Electrophysiologist:  None   [id number]}   Chief Complaint  Patient presents with   Chest Pain    History of Present Illness:  Lindsay Travis is a 61 y.o. female  with history of HTN, HLD, DM, CVA 2019.Admitted May 2019 with gait issues MRI with right basal ganglia/coronal radiata infarct due to small vessel disease Thought due to poorly controlled DM, HLD and HTN.  Started on statin, norvasc asa and plavix DAT for only 3 weeks then asa alone Echo done 01/11/18 normal EF 60-65% no PFO.  Patient saw Dr. Johnsie Cancel 10/2019 with atypical chest pain. NST was normal. Echo ordered but not done.  Patient added onto my schedule for recurrent chest pain referred by PCP for stress test and echo. A1C 10 yest. Patient says chest pain was like a pin prick and got better drinking. Yesterday she describes the pain as tightness at rest-doesn't last more than 5 min. 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 When she exercises on bike she feels tired.LDL 116 07/2021 on omega 3. Didn't tolerate a statin but doesn't remember which one. Foot pain.  Past Medical History:  Diagnosis Date   Anemia    Arthritis    bilateral kneed and lower legs   Clotting disorder ([place])    Diabetes mellitus    recent high blood sugar and has RX but not taking  med -b/c she doesn't think she  is diabetic, just had lots of sugar in her diet when dx'd.-on meds   Hx of adenomatous polyp of colon 07/2021   Repeat colonoscopy 2029   Hypertension    on meds   Shortness of breath    on exertion- has low hgb   Sickle cell trait [facility])     Past Surgical History:  Procedure Laterality Date   ABDOMINAL  HYSTERECTOMY  09/11/2011   Procedure: HYSTERECTOMY ABDOMINAL;  Surgeon: Frederico Hamman, MD;  Location: [facility];  Service: Gynecology;  Laterality: N/A;   COLONOSCOPY WITH PROPOFOL N/A 07/16/2021   Procedure: COLONOSCOPY WITH PROPOFOL;  Surgeon: Gatha Mayer, MD;  Location: [facility];  Service: Endoscopy;  Laterality: N/A;   POLYPECTOMY  07/16/2021   Procedure: POLYPECTOMY;  Surgeon: Gatha Mayer, MD;  Location: [facility];  Service: Endoscopy;;    Current Medications: Current Meds  Medication Sig   amLODipine (NORVASC) 5 MG tablet Take 1 tablet (5 mg total) by mouth daily.   Ascorbic Acid (VITAMIN C) 1000 MG tablet Take 1,000 mg by mouth 2 (two) times a week.   aspirin 81 MG EC tablet Take 1 tablet (81 mg total) by mouth daily.   blood glucose meter kit and supplies Per insurance preference. Check daily fasting blood glucose. (Dx. E11.9).   dapagliflozin propanediol (FARXIGA) 10 MG TABS tablet Take by mouth daily.   Omega-3 Fatty Acids (OMEGA-3 FISH OIL PO) Take 1 capsule by mouth in the morning.   Semaglutide, 1 MG/DOSE, (OZEMPIC, 1 MG/DOSE,) 4 MG/3ML SOPN Inject 1 mg into the skin once a week.     Allergies:   Lisinopril, Losartan, and Aspirin   Social History   Socioeconomic History  Marital status: Divorced    Spouse name: Not on file   Number of children: Not on file   Years of education: Not on file   Highest education level: Not on file  Occupational History   Not on file  Tobacco Use   Smoking status: Never   Smokeless tobacco: Never  Vaping Use   Vaping Use: Never used  Substance and Sexual Activity   Alcohol use: No   Drug use: No   Sexual activity: Never  Other Topics Concern   Not on file  Social History Narrative   Not on file   Social Determinants of Health   Financial Resource Strain: Not on file  Food Insecurity: Not on file  Transportation Needs: Not on file  Physical Activity: Not on file  Stress: Not on file  Social Connections: Not  on file     Family History:  The patient's  family history includes Hypertension in an other family member.   ROS:   Please see the history of present illness.    ROS All other systems reviewed and are negative.   PHYSICAL EXAM:   VS:  BP 132/82   Pulse 87   Ht '5\' 6"'  (1.676 m)   Wt 228 lb 3.2 oz (103.5 kg)   LMP 09/10/2011   SpO2 93%   BMI 36.83 kg/m   Physical Exam  GEN: Obese, in no acute distress  Neck: no JVD, carotid bruits, or masses Cardiac:RRR; no murmurs, rubs, or gallops  Respiratory:  clear to auscultation bilaterally, normal work of breathing GI: soft, nontender, nondistended, + BS Ext: without cyanosis, clubbing, or edema, Good distal pulses bilaterally Neuro:  Alert and Oriented x 3, Psych: euthymic mood, full affect  Wt Readings from Last 3 Encounters:  03/20/22 228 lb 3.2 oz (103.5 kg)  03/19/22 227 lb 2 oz (103 kg)  11/16/21 240 lb (108.9 kg)      Studies/Labs Reviewed:   EKG:  EKG is not ordered today.  The ekg done at PCP yest not viewable but report on PCP note says LA enlargement  Recent Labs: 08/01/2021: ALT 19; BUN 20; Creatinine, Ser 1.04; Potassium 4.0; Sodium 142   Lipid Panel    Component Value Date/Time   CHOL 212 (H) 08/01/2021 1500   CHOL 199 11/10/2019 1637   TRIG 192.0 (H) 08/01/2021 1500   HDL 57.40 08/01/2021 1500   HDL 64 11/10/2019 1637   CHOLHDL 4 08/01/2021 1500   VLDL 38.4 08/01/2021 1500   LDLCALC 116 (H) 08/01/2021 1500   LDLCALC 108 (H) 11/10/2019 1637    Additional studies/ records that were reviewed today include:  NST 11/05/19 Nuclear stress EF: 67%. The left ventricular ejection fraction is hyperdynamic (>65%). There was no ST segment deviation noted during stress. The study is normal. This is a low risk study.   Normal stress nuclear study with no ischemia or infarction; EF 67% with normal wall motion.   Echo 01/11/18 Study Conclusions   - Left ventricle: The cavity size was normal. Systolic function was     normal. The estimated ejection fraction was in the range of 60%    to 65%. Mild concentric and moderate focal basal septal    hypertrophy. Wall motion was normal; there were no regional wall    motion abnormalities. Doppler parameters are consistent with    abnormal left ventricular relaxation (grade 1 diastolic    dysfunction).  - Atrial septum: No defect or patent foramen ovale was identified.  -  Pulmonic valve: There was mild regurgitation.   Risk Assessment/Calculations:         ASSESSMENT:    1. Chest pain, unspecified type   2. Cerebral thrombosis with cerebral infarction   3. Essential hypertension   4. Dyslipidemia   5. Obesity, diabetes, and hypertension syndrome ([place])      PLAN:  In order of problems listed above:  Chest pain-normal lexiscan 10/2019. Typical and atypical chest pain-some exertional and multiple CV risk factors as listed below. Will order lexiscan.  History of CVA due to poorly controlled HTN, DM, HLD, small vessel on ASA  HTN controlled on amlodipine   HLD LDL 116 07/2021-stop statin due to foot cramps-doesn't know which one. Will try pravastatin  DM uncontrolled A1C>10  Obestiy-exercise and weight loss  Shared Decision Making/Informed Consent   Shared Decision Making/Informed Consent The risks [chest pain, shortness of breath, cardiac arrhythmias, dizziness, blood pressure fluctuations, myocardial infarction, stroke/transient ischemic attack, nausea, vomiting, allergic reaction, radiation exposure, metallic taste sensation and life-threatening complications (estimated to be 1 in 10,000)], benefits (risk stratification, diagnosing coronary artery disease, treatment guidance) and alternatives of a nuclear stress test were discussed in detail with Ms. Treadway and she agrees to proceed.    Medication Adjustments/Labs and Tests Ordered: Current medicines are reviewed at length with the patient today.  Concerns regarding medicines are outlined  above.  Medication changes, Labs and Tests ordered today are listed in the Patient Instructions below. There are no Patient Instructions on file for this visit.   Sumner Boast, PA-C  03/20/2022 2:01 PM    [facility] [place], [place], [place]  [address] Phone: [phone]; Fax: [phone]

## 2022-03-20 NOTE — Telephone Encounter (Signed)
New prescription for glipizide 5 mg twice a day sent to pharmacy of record.  Thanks.

## 2022-03-20 NOTE — Telephone Encounter (Signed)
PT calls today in regards to yesterday's visit. PT was not able to pick up her new blood sugar medication as well as her new equipment for testing blood sugar and wanted to check the status on both of these.    CB: [phone]

## 2022-03-21 NOTE — Telephone Encounter (Signed)
Patient informed of script being sent in and to pick it up and start.

## 2022-04-04 ENCOUNTER — Telehealth (HOSPITAL_COMMUNITY): Payer: Self-pay | Admitting: *Deleted

## 2022-04-04 NOTE — Telephone Encounter (Signed)
Patient given detailed instructions per Myocardial Perfusion Study Information Sheet for the test on 04/10/2022 at 10:15. Patient notified to arrive 15 minutes early and that it is imperative to arrive on time for appointment to keep from having the test rescheduled.  If you need to cancel or reschedule your appointment, please call the office within 24 hours of your appointment. . Patient verbalized understanding.Lindsay Travis

## 2022-04-10 ENCOUNTER — Ambulatory Visit (HOSPITAL_COMMUNITY): Payer: 59 | Attending: Cardiology

## 2022-04-10 ENCOUNTER — Ambulatory Visit (HOSPITAL_BASED_OUTPATIENT_CLINIC_OR_DEPARTMENT_OTHER): Payer: 59

## 2022-04-10 DIAGNOSIS — R079 Chest pain, unspecified: Secondary | ICD-10-CM | POA: Diagnosis not present

## 2022-04-10 LAB — ECHOCARDIOGRAM COMPLETE
Area-P 1/2: 3.31 cm2
S' Lateral: 2.8 cm

## 2022-04-10 LAB — MYOCARDIAL PERFUSION IMAGING
LV dias vol: 72 mL (ref 46–106)
LV sys vol: 26 mL
Nuc Stress EF: 64 %
Peak HR: 96 {beats}/min
Rest HR: 65 {beats}/min
Rest Nuclear Isotope Dose: 10.2 mCi
SDS: 0
SRS: 0
SSS: 0
ST Depression (mm): 0 mm
Stress Nuclear Isotope Dose: 30.3 mCi
TID: 1.07

## 2022-04-10 MED ORDER — TECHNETIUM TC 99M TETROFOSMIN IV KIT
30.7000 | PACK | Freq: Once | INTRAVENOUS | Status: AC | PRN
  Administered 2022-04-10: 30.7 via INTRAVENOUS

## 2022-04-10 MED ORDER — REGADENOSON 0.4 MG/5ML IV SOLN
0.4000 mg | Freq: Once | INTRAVENOUS | Status: AC
  Administered 2022-04-10: 0.4 mg via INTRAVENOUS

## 2022-04-10 MED ORDER — TECHNETIUM TC 99M TETROFOSMIN IV KIT
10.2000 | PACK | Freq: Once | INTRAVENOUS | Status: AC | PRN
  Administered 2022-04-10: 10.2 via INTRAVENOUS

## 2022-05-17 NOTE — Progress Notes (Signed)
Cardiology Office Note    Date:  05/30/2022   ID:  Lindsay Travis, DOB July 11, 1961, MRN [id number]   PCP:  Lindsay Travis, [place]  Cardiologist:  Lindsay Rouge, MD    Electrophysiologist:  None   1  History of Present Illness:  Lindsay Travis is a 61 y.o. female  with history of HTN, HLD, DM, CVA 2019.Admitted May 2019 with gait issues MRI with right basal ganglia/coronal radiata infarct due to small vessel disease Thought due to poorly controlled DM, HLD and HTN.  Started on statin, norvasc asa and plavix DAT for only 3 weeks then asa alone Echo done 01/11/18 normal EF 60-65% no PFO.  Patient saw me last in 10/2019 with atypical chest pain. Myovue was normal no ischemia EF 67% Echo ordered but not done.  Seen by PA 03/20/22 recurrent chest pain Diabetes poorly controlled . A1C 10  Repeat myovue done 04/10/22 also normal with EF 64% and no ischemia .LDL 116 07/2021 on omega 3. Didn't tolerate a statin but doesn't remember which one. .  She has worse BS control using Ozempic ? 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 Her daughter lives with her and helps out   Past Medical History:  Diagnosis Date   Anemia    Arthritis    bilateral kneed and lower legs   Clotting disorder ([place])    Diabetes mellitus    recent high blood sugar and has RX but not taking  med -b/c she doesn't think she  is diabetic, just had lots of sugar in her diet when dx'd.-on meds   Hx of adenomatous polyp of colon 07/2021   Repeat colonoscopy 2029   Hypertension    on meds   Shortness of breath    on exertion- has low hgb   Sickle cell trait [facility])     Past Surgical History:  Procedure Laterality Date   ABDOMINAL HYSTERECTOMY  09/11/2011   Procedure: HYSTERECTOMY ABDOMINAL;  Surgeon: Lindsay Hamman, MD;  Location: [facility];  Service: Gynecology;  Laterality: N/A;   COLONOSCOPY WITH  PROPOFOL N/A 07/16/2021   Procedure: COLONOSCOPY WITH PROPOFOL;  Surgeon: Lindsay Mayer, MD;  [facility];  Service: Endoscopy;  Laterality: N/A;   POLYPECTOMY  07/16/2021   Procedure: POLYPECTOMY;  Surgeon: Lindsay Mayer, MD;  [facility]: [facility];  Service: Endoscopy;;    Current Medications: Current Meds  Medication Sig   amLODipine (NORVASC) 5 MG tablet Take 1 tablet (5 mg total) by mouth daily.   Ascorbic Acid (VITAMIN C) 1000 MG tablet Take 1,000 mg by mouth 2 (two) times a week.   aspirin 81 MG EC tablet Take 1 tablet (81 mg total) by mouth daily.   blood glucose meter kit and supplies Per insurance preference. Check daily fasting blood glucose. (Dx. E11.9).   glipiZIDE (GLUCOTROL) 5 MG tablet Take 1 tablet (5 mg total) by mouth 2 (two) times daily before a meal.   Omega-3 Fatty Acids (OMEGA-3 FISH OIL PO) Take 1 capsule by mouth in the morning.   Semaglutide, 1 MG/DOSE, (OZEMPIC, 1 MG/DOSE,) 4 MG/3ML SOPN Inject 1 mg into the skin once a week.     Allergies:   Lisinopril, Losartan, and Aspirin   Social History   Socioeconomic History   Marital status: Divorced    Spouse name: Not on file   Number of  children: Not on file   Years of education: Not on file   Highest education level: Not on file  Occupational History   Not on file  Tobacco Use   Smoking status: Never   Smokeless tobacco: Never  Vaping Use   Vaping Use: Never used  Substance and Sexual Activity   Alcohol use: No   Drug use: No   Sexual activity: Never  Other Topics Concern   Not on file  Social History Narrative   Not on file   Social Determinants of Health   Financial Resource Strain: Not on file  Food Insecurity: Not on file  Transportation Needs: Not on file  Physical Activity: Not on file  Stress: Not on file  Social Connections: Not on file     Family History:  The patient's  family history includes Hypertension in an other family member.   ROS:   Please see the history  of present illness.    ROS All other systems reviewed and are negative.   PHYSICAL EXAM:   VS:  BP 122/72   Pulse 88   Ht '5\' 6"'  (1.676 m)   Wt 218 lb (98.9 kg)   LMP 09/10/2011   SpO2 98%   BMI 35.19 kg/m   Physical Exam  Obese female  Affect appropriate HEENT: normal Neck supple with no adenopathy JVP normal no bruits no thyromegaly Lungs clear with no wheezing and good diaphragmatic motion Heart:  S1/S2 no murmur, no rub, gallop or click PMI normal Abdomen: benighn, BS positve, no tenderness, no AAA no bruit.  No HSM or HJR Distal pulses intact with no bruits No edema Neuro non-focal Skin warm and dry No muscular weakness   Wt Readings from Last 3 Encounters:  05/30/22 218 lb (98.9 kg)  03/20/22 228 lb 3.2 oz (103.5 kg)  03/19/22 227 lb 2 oz (103 kg)      Studies/Labs Reviewed:   EKG:  SR rate 74 LAE otherwise normal 03/19/22   Recent Labs: 08/01/2021: ALT 19; BUN 20; Creatinine, Ser 1.04; Potassium 4.0; Sodium 142   Lipid Panel    Component Value Date/Time   CHOL 212 (H) 08/01/2021 1500   CHOL 199 11/10/2019 1637   TRIG 192.0 (H) 08/01/2021 1500   HDL 57.40 08/01/2021 1500   HDL 64 11/10/2019 1637   CHOLHDL 4 08/01/2021 1500   VLDL 38.4 08/01/2021 1500   LDLCALC 116 (H) 08/01/2021 1500   LDLCALC 108 (H) 11/10/2019 1637    Additional studies/ records that were reviewed today include:   Myovue 04/10/22    The study is normal. The study is low risk.   No ST deviation was noted.   LV perfusion is normal. There is no evidence of ischemia. There is no evidence of infarction.   Left ventricular function is normal. Nuclear stress EF: 64 %. The left ventricular ejection fraction is normal (55-65%). End diastolic cavity size is normal. End systolic cavity size is normal.   Prior study available for comparison from 11/05/2019.  Echo 04/10/22 IMPRESSIONS     1. Global longitiudinal strain is -18.9%. Left ventricular ejection  fraction, by estimation, is 65  to 70%. The left ventricle has normal  function. The left ventricle has no regional wall motion abnormalities.  There is mild left ventricular hypertrophy.  Left ventricular diastolic parameters were normal.   2. Right ventricular systolic function is normal. The right ventricular  size is normal.   3. The mitral valve is normal in structure. Trivial mitral  valve  regurgitation.   4. The aortic valve is normal in structure. Aortic valve regurgitation is  not visualized.   5. The inferior vena cava is dilated in size with <50% respiratory  variability, suggesting right atrial pressure of 15 mmHg   PLAN:  In order of problems listed above:  Chest pain-atypical normal myovue 2021 and 04/10/22   History of CVA due to poorly controlled HTN, DM, HLD, small vessel on ASA and statin   HTN controlled on amlodipine   HLD LDL 116 07/2021 tolerating pravastatin 03/20/22   DM uncontrolled A1C>10  Obestiy-exercise and weight loss  Lipid and Liver  F/U in a year   Signed, Lindsay Rouge, MD  05/30/2022 1:50 PM    [facility] [place], [place], [place]  [address] Phone: [phone]; Fax: [phone]

## 2022-05-21 ENCOUNTER — Other Ambulatory Visit: Payer: 59

## 2022-05-30 ENCOUNTER — Encounter: Payer: Self-pay | Admitting: Cardiovascular Disease

## 2022-05-30 ENCOUNTER — Ambulatory Visit: Payer: Commercial Managed Care - HMO | Attending: Cardiovascular Disease | Admitting: Cardiovascular Disease

## 2022-05-30 VITALS — BP 122/72 | HR 88 | Ht 66.0 in | Wt 218.0 lb

## 2022-05-30 DIAGNOSIS — E1159 Type 2 diabetes mellitus with other circulatory complications: Secondary | ICD-10-CM

## 2022-05-30 DIAGNOSIS — I152 Hypertension secondary to endocrine disorders: Secondary | ICD-10-CM

## 2022-05-30 DIAGNOSIS — E669 Obesity, unspecified: Secondary | ICD-10-CM

## 2022-05-30 DIAGNOSIS — E1169 Type 2 diabetes mellitus with other specified complication: Secondary | ICD-10-CM | POA: Diagnosis not present

## 2022-05-30 DIAGNOSIS — I10 Essential (primary) hypertension: Secondary | ICD-10-CM

## 2022-05-30 DIAGNOSIS — R079 Chest pain, unspecified: Secondary | ICD-10-CM | POA: Diagnosis not present

## 2022-05-30 NOTE — Patient Instructions (Signed)
Medication Instructions:  Your physician recommends that you continue on your current medications as directed. Please refer to the Current Medication list given to you today.  *If you need a refill on your cardiac medications before your next appointment, please call your pharmacy*  Lab Work: If you have labs (blood work) drawn today and your tests are completely normal, you will receive your results only by: MyChart Message (if you have MyChart) OR A paper copy in the mail If you have any lab test that is abnormal or we need to change your treatment, we will call you to review the results.  Testing/Procedures: None ordered today.  Follow-Up: At [facility], you and your health needs are our priority.  As part of our continuing mission to provide you with exceptional heart care, we have created designated Provider Care Teams.  These Care Teams include your primary Cardiologist (physician) and Advanced Practice Providers (APPs -  Physician Assistants and Nurse Practitioners) who all work together to provide you with the care you need, when you need it.  We recommend signing up for the patient portal called "MyChart".  Sign up information is provided on this After Visit Summary.  MyChart is used to connect with patients for Virtual Visits (Telemedicine).  Patients are able to view lab/test results, encounter notes, upcoming appointments, etc.  Non-urgent messages can be sent to your provider as well.   To learn more about what you can do with MyChart, go to [email or web address].    Your next appointment:   1 year(s)  The format for your next appointment:   In Person  Provider:   Peter Nishan, MD     Important Information About Sugar       

## 2022-06-12 ENCOUNTER — Telehealth: Payer: Self-pay | Admitting: *Deleted

## 2022-06-12 ENCOUNTER — Encounter: Payer: Self-pay | Admitting: *Deleted

## 2022-06-12 NOTE — Telephone Encounter (Signed)
PA for Ozempic approved. 

## 2022-06-12 NOTE — Telephone Encounter (Signed)
PA for Ozempic submitted, awaiting response Key: [id number]

## 2022-06-12 NOTE — Telephone Encounter (Signed)
This encounter was created in error - please disregard.

## 2022-06-19 ENCOUNTER — Encounter: Payer: Self-pay | Admitting: *Deleted

## 2022-06-19 ENCOUNTER — Ambulatory Visit (INDEPENDENT_AMBULATORY_CARE_PROVIDER_SITE_OTHER): Payer: Commercial Managed Care - HMO | Admitting: Emergency Medicine

## 2022-06-19 ENCOUNTER — Encounter: Payer: Self-pay | Admitting: Emergency Medicine

## 2022-06-19 VITALS — BP 136/84 | HR 76 | Temp 98.4°F | Ht 66.0 in | Wt 223.1 lb

## 2022-06-19 DIAGNOSIS — E1169 Type 2 diabetes mellitus with other specified complication: Secondary | ICD-10-CM | POA: Diagnosis not present

## 2022-06-19 DIAGNOSIS — T466X5A Adverse effect of antihyperlipidemic and antiarteriosclerotic drugs, initial encounter: Secondary | ICD-10-CM

## 2022-06-19 DIAGNOSIS — E119 Type 2 diabetes mellitus without complications: Secondary | ICD-10-CM

## 2022-06-19 DIAGNOSIS — E669 Obesity, unspecified: Secondary | ICD-10-CM

## 2022-06-19 DIAGNOSIS — E1159 Type 2 diabetes mellitus with other circulatory complications: Secondary | ICD-10-CM | POA: Diagnosis not present

## 2022-06-19 DIAGNOSIS — G720 Drug-induced myopathy: Secondary | ICD-10-CM | POA: Diagnosis not present

## 2022-06-19 DIAGNOSIS — E785 Hyperlipidemia, unspecified: Secondary | ICD-10-CM

## 2022-06-19 DIAGNOSIS — E1165 Type 2 diabetes mellitus with hyperglycemia: Secondary | ICD-10-CM | POA: Diagnosis not present

## 2022-06-19 DIAGNOSIS — I152 Hypertension secondary to endocrine disorders: Secondary | ICD-10-CM

## 2022-06-19 LAB — POCT GLYCOSYLATED HEMOGLOBIN (HGB A1C): Hemoglobin A1C: 7.8 % — AB (ref 4.0–5.6)

## 2022-06-19 MED ORDER — DAPAGLIFLOZIN PROPANEDIOL 10 MG PO TABS
10.0000 mg | ORAL_TABLET | Freq: Every day | ORAL | 3 refills | Status: DC
Start: 2022-06-19 — End: 2022-10-17

## 2022-06-19 NOTE — Progress Notes (Signed)
Lindsay Travis 61 y.o.   Chief Complaint  Patient presents with   Follow-up    1mth f/u appt, no concerns     HISTORY OF PRESENT ILLNESS: This is a 61y.o. female with history of diabetes and hypertension here for 312-monthollow-up. Was able to follow-up with cardiologist for chest pain and dyspnea on exertion. Normal stress test and unremarkable echocardiogram. Cardiologist evaluation assessment, plan, and results as follows:    ASSESSMENT:     1. Chest pain, unspecified type   2. Cerebral thrombosis with cerebral infarction   3. Essential hypertension   4. Dyslipidemia   5. Obesity, diabetes, and hypertension syndrome ([facility]        PLAN:  In order of problems listed above:   Chest pain-normal lexiscan 10/2019. Typical and atypical chest pain-some exertional and multiple CV risk factors as listed below. Will order lexiscan.   History of CVA due to poorly controlled HTN, DM, HLD, small vessel on ASA   HTN controlled on amlodipine    HLD LDL 116 07/2021-stop statin due to foot cramps-doesn't know which one. Will try pravastatin   DM uncontrolled A1C>10   Obestiy-exercise and weight loss Nuclear Study Quality Overall image quality is good. There are no nuclear artifacts present. Study was gated.  Isotope Administration Tc99 Tetrofosmin isotope was administered through IV injection. Rest SPECT images were obtained approximately 45-60 minutes post tracer injection. Rest Dose: 10.2 mCi. Stress dose: 30.3 mCi. Stress SPECT images were obtained approximately 60 minutes post tracer injection.   Stress Findings  Resting ECG ECG rhythm shows normal sinus rhythm.  Stress Findings A pharmacological stress test was performed using IV Lexiscan 0.32m20mver 10 seconds performed without concurrent submaximal exercise.  Maximum HR of 96 bpm.  The patient reported dyspnea during the stress test.  Stress ECG No ST deviation was noted. There were no arrhythmias during stress. There were  no arrhythmias during recovery. ECG was interpretable and non-conclusive. The ECG was not diagnostic due to pharmacologic protocol    The study is normal. The study is low risk.   No ST deviation was noted.   LV perfusion is normal. There is no evidence of ischemia. There is no evidence of infarction.   Left ventricular function is normal. Nuclear stress EF: 64 %. The left ventricular ejection fraction is normal (55-65%). End diastolic cavity size is normal. End systolic cavity size is normal.   Prior study available for comparison from 11/05/2019.IMPRESSIONS     1. Global longitiudinal strain is -18.9%. Left ventricular ejection  fraction, by estimation, is 65 to 70%. The left ventricle has normal  function. The left ventricle has no regional wall motion abnormalities.  There is mild left ventricular hypertrophy.  Left ventricular diastolic parameters were normal.   2. Right ventricular systolic function is normal. The right ventricular  size is normal.   3. The mitral valve is normal in structure. Trivial mitral valve  regurgitation.   4. The aortic valve is normal in structure. Aortic valve regurgitation is  not visualized.   5. The inferior vena cava is dilated in size with <50% respiratory  variability, suggesting right atrial pressure of 15 mmHg.             HPI   Prior to Admission medications   Medication Sig Start Date End Date Taking? Authorizing Provider  amLODipine (NORVASC) 5 MG tablet Take 1 tablet (5 mg total) by mouth daily. 03/19/22 03/14/23 Yes Qiara Minetti, MigInes BloomerD  Ascorbic Acid (  VITAMIN C) 1000 MG tablet Take 1,000 mg by mouth 2 (two) times a week.   Yes [provider]  aspirin 81 MG EC tablet Take 1 tablet (81 mg total) by mouth daily. 03/20/18  Yes Frann Rider, NP  blood glucose meter kit and supplies Per insurance preference. Check daily fasting blood glucose. (Dx. E11.9). 03/20/22  Yes Taimane Stimmel, Ines Bloomer, MD  glipiZIDE (GLUCOTROL) 5 MG  tablet Take 1 tablet (5 mg total) by mouth 2 (two) times daily before a meal. 03/20/22 03/15/23 Yes Avondre Richens, Ines Bloomer, MD  Omega-3 Fatty Acids (OMEGA-3 FISH OIL PO) Take 1 capsule by mouth in the morning.   Yes [provider]  pravastatin (PRAVACHOL) 20 MG tablet Take 1 tablet (20 mg total) by mouth every evening. 03/20/22  Yes Imogene Burn, PA-C    Allergies  Allergen Reactions   Lisinopril Other (See Comments)    Did not feel good   Losartan Other (See Comments)    headache   Aspirin Other (See Comments)    Abdominal pain only 325 mg     Patient Active Problem List   Diagnosis Date Noted   Chest pain 03/19/2022   Statin myopathy 08/01/2021   Obesity, diabetes, and hypertension syndrome ([place]) 01/09/2021   Morbid obesity ([place]) 09/18/2019   Cerebral thrombosis with cerebral infarction 01/10/2018   Dyslipidemia    Type 2 diabetes mellitus with hyperglycemia, without long-term current use of insulin ([place]) 08/07/2015   Varicose veins of lower extremities with other complications 73/41/9379   Fibroid, uterus 04/18/2011   Hypertension associated with diabetes ([place]) 04/18/2011    Past Medical History:  Diagnosis Date   Anemia    Arthritis    bilateral kneed and lower legs   Clotting disorder ([place])    Diabetes mellitus    recent high blood sugar and has RX but not taking  med -b/c she doesn't think she  is diabetic, just had lots of sugar in her diet when dx'd.-on meds   Hx of adenomatous polyp of colon 07/2021   Repeat colonoscopy 2029   Hypertension    on meds   Shortness of breath    on exertion- has low hgb   Sickle cell trait [facility])     Past Surgical History:  Procedure Laterality Date   ABDOMINAL HYSTERECTOMY  09/11/2011   Procedure: HYSTERECTOMY ABDOMINAL;  Surgeon: Frederico Hamman, MD;  Location: [facility];  Service: Gynecology;  Laterality: N/A;   COLONOSCOPY WITH PROPOFOL N/A 07/16/2021   Procedure: COLONOSCOPY WITH PROPOFOL;  Surgeon: Gatha Mayer,  MD;  Location: [facility];  Service: Endoscopy;  Laterality: N/A;   POLYPECTOMY  07/16/2021   Procedure: POLYPECTOMY;  Surgeon: Gatha Mayer, MD;  Location: [facility];  Service: Endoscopy;;    Social History   Socioeconomic History   Marital status: Divorced    Spouse name: Not on file   Number of children: Not on file   Years of education: Not on file   Highest education level: Not on file  Occupational History   Not on file  Tobacco Use   Smoking status: Never   Smokeless tobacco: Never  Vaping Use   Vaping Use: Never used  Substance and Sexual Activity   Alcohol use: No   Drug use: No   Sexual activity: Never  Other Topics Concern   Not on file  Social History Narrative   Not on file   Social Determinants of Health   Financial Resource Strain: Not on  file  Food Insecurity: Not on file  Transportation Needs: Not on file  Physical Activity: Not on file  Stress: Not on file  Social Connections: Not on file  Intimate Partner Violence: Not on file    Family History  Problem Relation Age of Onset   Hypertension Other    Colon polyps Neg Hx    Colon cancer Neg Hx    Heart disease Neg Hx    Esophageal cancer Neg Hx    Rectal cancer Neg Hx    Stomach cancer Neg Hx      Review of Systems  Constitutional: Negative.  Negative for chills and fever.  HENT: Negative.  Negative for congestion and sore throat.   Respiratory: Negative.  Negative for cough and shortness of breath.   Cardiovascular: Negative.  Negative for chest pain and palpitations.  Gastrointestinal:  Negative for abdominal pain, nausea and vomiting.  Genitourinary: Negative.  Negative for dysuria.  Skin: Negative.  Negative for rash.  Neurological:  Negative for dizziness and headaches.  All other systems reviewed and are negative.  Today's Vitals   06/19/22 1351  BP: 136/84  Pulse: 76  Temp: 98.4 F (36.9 C)  TempSrc: Oral  SpO2: 94%  Weight: 223 lb 2 oz (101.2 kg)  Height: '5\' 6"'   (1.676 m)   Body mass index is 36.01 kg/m. Wt Readings from Last 3 Encounters:  06/19/22 223 lb 2 oz (101.2 kg)  05/30/22 218 lb (98.9 kg)  03/20/22 228 lb 3.2 oz (103.5 kg)     Physical Exam Vitals reviewed.  Constitutional:      Appearance: Normal appearance.  HENT:     Head: Normocephalic.     Mouth/Throat:     Mouth: Mucous membranes are moist.     Pharynx: Oropharynx is clear.  Eyes:     Extraocular Movements: Extraocular movements intact.     Conjunctiva/sclera: Conjunctivae normal.     Pupils: Pupils are equal, round, and reactive to light.  Cardiovascular:     Rate and Rhythm: Normal rate and regular rhythm.     Pulses: Normal pulses.     Heart sounds: Normal heart sounds.  Pulmonary:     Effort: Pulmonary effort is normal.     Breath sounds: Normal breath sounds.  Musculoskeletal:     Cervical back: No tenderness.     Right lower leg: No edema.     Left lower leg: No edema.  Lymphadenopathy:     Cervical: No cervical adenopathy.  Skin:    General: Skin is warm and dry.  Neurological:     General: No focal deficit present.     Mental Status: She is alert and oriented to person, place, and time.  Psychiatric:        Mood and Affect: Mood normal.        Behavior: Behavior normal.    Results for orders placed or performed in visit on 06/19/22 (from the past 24 hour(s))  POCT HgB A1C     Status: Abnormal   Collection Time: 06/19/22  2:02 PM  Result Value Ref Range   Hemoglobin A1C 7.8 (A) 4.0 - 5.6 %   HbA1c POC (<> result, manual entry)     HbA1c, POC (prediabetic range)     HbA1c, POC (controlled diabetic range)       ASSESSMENT & PLAN: A total of 47 minutes was spent with the patient and counseling/coordination of care regarding preparing for this visit, review of most recent office visit notes,  review of most recent blood work results including interpretation of today's hemoglobin A1c, review of cardiologist's office visit notes, review of  cardiovascular reports including echocardiogram and stress test, review of multiple chronic medical problems and their management, review of all medications and changes made, cardiovascular risks associated with hypertension and diabetes, education on nutrition, prognosis, documentation, and need for follow-up in 3 months.  Problem List Items Addressed This Visit       Cardiovascular and Mediastinum   Hypertension associated with diabetes ([place])    Well-controlled hypertension. BP Readings from Last 3 Encounters:  06/19/22 136/84  05/30/22 122/72  03/20/22 132/82  Continue amlodipine 5 mg daily. Cardiovascular risks associated with hypertension discussed. Dietary approaches to stop hypertension discussed.       Relevant Medications   dapagliflozin propanediol (FARXIGA) 10 MG TABS tablet   Obesity, diabetes, and hypertension syndrome (HCC)   Relevant Medications   dapagliflozin propanediol (FARXIGA) 10 MG TABS tablet     Endocrine   Type 2 diabetes mellitus with hyperglycemia, without long-term current use of insulin (HCC) - Primary    Improved hemoglobin A1c at 7.8. Patient only taking glipizide 5 mg twice a day. Recommend to start Farxiga 10 mg daily. Patient did not like side effects from Ozempic. Intolerant to metformin. Diet and nutrition discussed. Follow-up in 3 months.      Relevant Medications   dapagliflozin propanediol (FARXIGA) 10 MG TABS tablet   Other Relevant Orders   POCT HgB A1C (Completed)   Dyslipidemia associated with type 2 diabetes mellitus ([place])    Stable.  Intolerant to statins. Continue omega-3's at night.      Relevant Medications   dapagliflozin propanediol (FARXIGA) 10 MG TABS tablet     Musculoskeletal and Integument   Statin myopathy   Patient Instructions  Continue glipizide 5 mg twice a day Start Farxiga 10 mg daily Follow-up in 3 months  Diabetes Mellitus and Nutrition, Adult When you have diabetes, or diabetes mellitus, it is  very important to have healthy eating habits because your blood sugar (glucose) levels are greatly affected by what you eat and drink. Eating healthy foods in the right amounts, at about the same times every day, can help you: Manage your blood glucose. Lower your risk of heart disease. Improve your blood pressure. Reach or maintain a healthy weight. What can affect my meal plan? Every person with diabetes is different, and each person has different needs for a meal plan. Your health care provider may recommend that you work with a dietitian to make a meal plan that is best for you. Your meal plan may vary depending on factors such as: The calories you need. The medicines you take. Your weight. Your blood glucose, blood pressure, and cholesterol levels. Your activity level. Other health conditions you have, such as heart or kidney disease. How do carbohydrates affect me? Carbohydrates, also called carbs, affect your blood glucose level more than any other type of food. Eating carbs raises the amount of glucose in your blood. It is important to know how many carbs you can safely have in each meal. This is different for every person. Your dietitian can help you calculate how many carbs you should have at each meal and for each snack. How does alcohol affect me? Alcohol can cause a decrease in blood glucose (hypoglycemia), especially if you use insulin or take certain diabetes medicines by mouth. Hypoglycemia can be a life-threatening condition. Symptoms of hypoglycemia, such as sleepiness, dizziness, and confusion, are similar  to symptoms of having too much alcohol. Do not drink alcohol if: Your health care provider tells you not to drink. You are pregnant, may be pregnant, or are planning to become pregnant. If you drink alcohol: Limit how much you have to: 0-1 drink a day for women. 0-2 drinks a day for men. Know how much alcohol is in your drink. In the [place] one drink equals one 12 oz  bottle of beer (355 mL), one 5 oz glass of wine (148 mL), or one 1 oz glass of hard liquor (44 mL). Keep yourself hydrated with water, diet soda, or unsweetened iced tea. Keep in mind that regular soda, juice, and other mixers may contain a lot of sugar and must be counted as carbs. What are tips for following this plan?  Reading food labels Start by checking the serving size on the Nutrition Facts label of packaged foods and drinks. The number of calories and the amount of carbs, fats, and other nutrients listed on the label are based on one serving of the item. Many items contain more than one serving per package. Check the total grams (g) of carbs in one serving. Check the number of grams of saturated fats and trans fats in one serving. Choose foods that have a low amount or none of these fats. Check the number of milligrams (mg) of salt (sodium) in one serving. Most people should limit total sodium intake to less than 2,300 mg per day. Always check the nutrition information of foods labeled as "low-fat" or "nonfat." These foods may be higher in added sugar or refined carbs and should be avoided. Talk to your dietitian to identify your daily goals for nutrients listed on the label. Shopping Avoid buying canned, pre-made, or processed foods. These foods tend to be high in fat, sodium, and added sugar. Shop around the outside edge of the grocery store. This is where you will most often find fresh fruits and vegetables, bulk grains, fresh meats, and fresh dairy products. Cooking Use low-heat cooking methods, such as baking, instead of high-heat cooking methods, such as deep frying. Cook using healthy oils, such as olive, canola, or sunflower oil. Avoid cooking with butter, cream, or high-fat meats. Meal planning Eat meals and snacks regularly, preferably at the same times every day. Avoid going long periods of time without eating. Eat foods that are high in fiber, such as fresh fruits,  vegetables, beans, and whole grains. Eat 4-6 oz (112-168 g) of lean protein each day, such as lean meat, chicken, fish, eggs, or tofu. One ounce (oz) (28 g) of lean protein is equal to: 1 oz (28 g) of meat, chicken, or fish. 1 egg.  cup (62 g) of tofu. Eat some foods each day that contain healthy fats, such as avocado, nuts, seeds, and fish. What foods should I eat? Fruits Berries. Apples. Oranges. Peaches. Apricots. Plums. Grapes. Mangoes. Papayas. Pomegranates. Kiwi. Cherries. Vegetables Leafy greens, including lettuce, spinach, kale, chard, collard greens, mustard greens, and cabbage. Beets. Cauliflower. Broccoli. Carrots. Green beans. Tomatoes. Peppers. Onions. Cucumbers. Brussels sprouts. Grains Whole grains, such as whole-wheat or whole-grain bread, crackers, tortillas, cereal, and pasta. Unsweetened oatmeal. Quinoa. Brown or wild rice. Meats and other proteins Seafood. Poultry without skin. Lean cuts of poultry and beef. Tofu. Nuts. Seeds. Dairy Low-fat or fat-free dairy products such as milk, yogurt, and cheese. The items listed above may not be a complete list of foods and beverages you can eat and drink. Contact a dietitian for more information.  What foods should I avoid? Fruits Fruits canned with syrup. Vegetables Canned vegetables. Frozen vegetables with butter or cream sauce. Grains Refined white flour and flour products such as bread, pasta, snack foods, and cereals. Avoid all processed foods. Meats and other proteins Fatty cuts of meat. Poultry with skin. Breaded or fried meats. Processed meat. Avoid saturated fats. Dairy Full-fat yogurt, cheese, or milk. Beverages Sweetened drinks, such as soda or iced tea. The items listed above may not be a complete list of foods and beverages you should avoid. Contact a dietitian for more information. Questions to ask a health care provider Do I need to meet with a certified diabetes care and education specialist? Do I need to  meet with a dietitian? What number can I call if I have questions? When are the best times to check my blood glucose? Where to find more information: American Diabetes Association: diabetes.org Academy of Nutrition and Dietetics: eatright.[employer] of Diabetes and Digestive and Kidney Diseases: [employer] Association of Diabetes Care & Education Specialists: diabeteseducator.org Summary It is important to have healthy eating habits because your blood sugar (glucose) levels are greatly affected by what you eat and drink. It is important to use alcohol carefully. A healthy meal plan will help you manage your blood glucose and lower your risk of heart disease. Your health care provider may recommend that you work with a dietitian to make a meal plan that is best for you. This information is not intended to replace advice given to you by your health care provider. Make sure you discuss any questions you have with your health care provider. Document Revised: 03/29/2020 Document Reviewed: 03/29/2020 Elsevier Patient Education  [place], MD [facility]

## 2022-06-19 NOTE — Assessment & Plan Note (Signed)
Improved hemoglobin A1c at 7.8. Patient only taking glipizide 5 mg twice a day. Recommend to start Farxiga 10 mg daily. Patient did not like side effects from Ozempic. Intolerant to metformin. Diet and nutrition discussed. Follow-up in 3 months.

## 2022-06-19 NOTE — Patient Instructions (Addendum)
Continue glipizide 5 mg twice a day Start Farxiga 10 mg daily Follow-up in 3 months  Diabetes Mellitus and Nutrition, Adult When you have diabetes, or diabetes mellitus, it is very important to have healthy eating habits because your blood sugar (glucose) levels are greatly affected by what you eat and drink. Eating healthy foods in the right amounts, at about the same times every day, can help you: Manage your blood glucose. Lower your risk of heart disease. Improve your blood pressure. Reach or maintain a healthy weight. What can affect my meal plan? Every person with diabetes is different, and each person has different needs for a meal plan. Your health care provider may recommend that you work with a dietitian to make a meal plan that is best for you. Your meal plan may vary depending on factors such as: The calories you need. The medicines you take. Your weight. Your blood glucose, blood pressure, and cholesterol levels. Your activity level. Other health conditions you have, such as heart or kidney disease. How do carbohydrates affect me? Carbohydrates, also called carbs, affect your blood glucose level more than any other type of food. Eating carbs raises the amount of glucose in your blood. It is important to know how many carbs you can safely have in each meal. This is different for every person. Your dietitian can help you calculate how many carbs you should have at each meal and for each snack. How does alcohol affect me? Alcohol can cause a decrease in blood glucose (hypoglycemia), especially if you use insulin or take certain diabetes medicines by mouth. Hypoglycemia can be a life-threatening condition. Symptoms of hypoglycemia, such as sleepiness, dizziness, and confusion, are similar to symptoms of having too much alcohol. Do not drink alcohol if: Your health care provider tells you not to drink. You are pregnant, may be pregnant, or are planning to become pregnant. If you  drink alcohol: Limit how much you have to: 0-1 drink a day for women. 0-2 drinks a day for men. Know how much alcohol is in your drink. In the [place] one drink equals one 12 oz bottle of beer (355 mL), one 5 oz glass of wine (148 mL), or one 1 oz glass of hard liquor (44 mL). Keep yourself hydrated with water, diet soda, or unsweetened iced tea. Keep in mind that regular soda, juice, and other mixers may contain a lot of sugar and must be counted as carbs. What are tips for following this plan?  Reading food labels Start by checking the serving size on the Nutrition Facts label of packaged foods and drinks. The number of calories and the amount of carbs, fats, and other nutrients listed on the label are based on one serving of the item. Many items contain more than one serving per package. Check the total grams (g) of carbs in one serving. Check the number of grams of saturated fats and trans fats in one serving. Choose foods that have a low amount or none of these fats. Check the number of milligrams (mg) of salt (sodium) in one serving. Most people should limit total sodium intake to less than 2,300 mg per day. Always check the nutrition information of foods labeled as "low-fat" or "nonfat." These foods may be higher in added sugar or refined carbs and should be avoided. Talk to your dietitian to identify your daily goals for nutrients listed on the label. Shopping Avoid buying canned, pre-made, or processed foods. These foods tend to be high in fat,  sodium, and added sugar. Shop around the outside edge of the grocery store. This is where you will most often find fresh fruits and vegetables, bulk grains, fresh meats, and fresh dairy products. Cooking Use low-heat cooking methods, such as baking, instead of high-heat cooking methods, such as deep frying. Cook using healthy oils, such as olive, canola, or sunflower oil. Avoid cooking with butter, cream, or high-fat meats. Meal planning Eat  meals and snacks regularly, preferably at the same times every day. Avoid going long periods of time without eating. Eat foods that are high in fiber, such as fresh fruits, vegetables, beans, and whole grains. Eat 4-6 oz (112-168 g) of lean protein each day, such as lean meat, chicken, fish, eggs, or tofu. One ounce (oz) (28 g) of lean protein is equal to: 1 oz (28 g) of meat, chicken, or fish. 1 egg.  cup (62 g) of tofu. Eat some foods each day that contain healthy fats, such as avocado, nuts, seeds, and fish. What foods should I eat? Fruits Berries. Apples. Oranges. Peaches. Apricots. Plums. Grapes. Mangoes. Papayas. Pomegranates. Kiwi. Cherries. Vegetables Leafy greens, including lettuce, spinach, kale, chard, collard greens, mustard greens, and cabbage. Beets. Cauliflower. Broccoli. Carrots. Green beans. Tomatoes. Peppers. Onions. Cucumbers. Brussels sprouts. Grains Whole grains, such as whole-wheat or whole-grain bread, crackers, tortillas, cereal, and pasta. Unsweetened oatmeal. Quinoa. Brown or wild rice. Meats and other proteins Seafood. Poultry without skin. Lean cuts of poultry and beef. Tofu. Nuts. Seeds. Dairy Low-fat or fat-free dairy products such as milk, yogurt, and cheese. The items listed above may not be a complete list of foods and beverages you can eat and drink. Contact a dietitian for more information. What foods should I avoid? Fruits Fruits canned with syrup. Vegetables Canned vegetables. Frozen vegetables with butter or cream sauce. Grains Refined white flour and flour products such as bread, pasta, snack foods, and cereals. Avoid all processed foods. Meats and other proteins Fatty cuts of meat. Poultry with skin. Breaded or fried meats. Processed meat. Avoid saturated fats. Dairy Full-fat yogurt, cheese, or milk. Beverages Sweetened drinks, such as soda or iced tea. The items listed above may not be a complete list of foods and beverages you should avoid.  Contact a dietitian for more information. Questions to ask a health care provider Do I need to meet with a certified diabetes care and education specialist? Do I need to meet with a dietitian? What number can I call if I have questions? When are the best times to check my blood glucose? Where to find more information: American Diabetes Association: diabetes.org Academy of Nutrition and Dietetics: eatright.[employer] of Diabetes and Digestive and Kidney Diseases: [employer] Association of Diabetes Care & Education Specialists: diabeteseducator.org Summary It is important to have healthy eating habits because your blood sugar (glucose) levels are greatly affected by what you eat and drink. It is important to use alcohol carefully. A healthy meal plan will help you manage your blood glucose and lower your risk of heart disease. Your health care provider may recommend that you work with a dietitian to make a meal plan that is best for you. This information is not intended to replace advice given to you by your health care provider. Make sure you discuss any questions you have with your health care provider. Document Revised: 03/29/2020 Document Reviewed: 03/29/2020 Elsevier Patient Education  [place].

## 2022-06-19 NOTE — Assessment & Plan Note (Signed)
Stable.  Intolerant to statins. Continue omega-3's at night.

## 2022-06-19 NOTE — Telephone Encounter (Signed)
This encounter was created in error - please disregard.

## 2022-06-19 NOTE — Assessment & Plan Note (Signed)
Well-controlled hypertension. BP Readings from Last 3 Encounters:  06/19/22 136/84  05/30/22 122/72  03/20/22 132/82  Continue amlodipine 5 mg daily. Cardiovascular risks associated with hypertension discussed. Dietary approaches to stop hypertension discussed.

## 2022-09-25 ENCOUNTER — Ambulatory Visit (INDEPENDENT_AMBULATORY_CARE_PROVIDER_SITE_OTHER): Payer: 59

## 2022-09-25 ENCOUNTER — Ambulatory Visit (INDEPENDENT_AMBULATORY_CARE_PROVIDER_SITE_OTHER): Payer: 59 | Admitting: Emergency Medicine

## 2022-09-25 ENCOUNTER — Encounter: Payer: Self-pay | Admitting: Emergency Medicine

## 2022-09-25 VITALS — BP 142/90 | HR 79 | Temp 98.3°F | Ht 66.0 in | Wt 226.4 lb

## 2022-09-25 DIAGNOSIS — E1159 Type 2 diabetes mellitus with other circulatory complications: Secondary | ICD-10-CM

## 2022-09-25 DIAGNOSIS — G720 Drug-induced myopathy: Secondary | ICD-10-CM | POA: Diagnosis not present

## 2022-09-25 DIAGNOSIS — M549 Dorsalgia, unspecified: Secondary | ICD-10-CM

## 2022-09-25 DIAGNOSIS — E1169 Type 2 diabetes mellitus with other specified complication: Secondary | ICD-10-CM | POA: Diagnosis not present

## 2022-09-25 DIAGNOSIS — I152 Hypertension secondary to endocrine disorders: Secondary | ICD-10-CM | POA: Diagnosis not present

## 2022-09-25 DIAGNOSIS — R21 Rash and other nonspecific skin eruption: Secondary | ICD-10-CM

## 2022-09-25 DIAGNOSIS — E785 Hyperlipidemia, unspecified: Secondary | ICD-10-CM

## 2022-09-25 DIAGNOSIS — T466X5A Adverse effect of antihyperlipidemic and antiarteriosclerotic drugs, initial encounter: Secondary | ICD-10-CM

## 2022-09-25 LAB — POCT GLYCOSYLATED HEMOGLOBIN (HGB A1C): Hemoglobin A1C: 6.9 % — AB (ref 4.0–5.6)

## 2022-09-25 MED ORDER — VALSARTAN 80 MG PO TABS: 80.0000 mg | ORAL_TABLET | Freq: Every day | ORAL | 3 refills | Status: DC

## 2022-09-25 NOTE — Assessment & Plan Note (Signed)
Mechanical in nature.  Mostly localized to right scapular area.  Pain management discussed.  No red flag signs or symptoms. Advised to use heat pad on and off during the day May use Tylenol and or Advil for pain management Chest x-ray done today

## 2022-09-25 NOTE — Assessment & Plan Note (Signed)
Stable.  Diet and nutrition discussed. Intolerant to statins. Lipid profile done today.

## 2022-09-25 NOTE — Assessment & Plan Note (Signed)
Elevated blood pressure reading in the office. Recommend to continue amlodipine 5 mg daily and will start valsartan 80 mg daily. Cardiovascular risk associated with hypertension and diabetes discussed Diet and nutrition discussed. Well-controlled diabetes with hemoglobin A1c of 6.9 Continue Farxiga 10 mg daily and glipizide 5 mg twice a day Follow-up in 3 months

## 2022-09-25 NOTE — Assessment & Plan Note (Signed)
Diet and nutrition discussed.  Advised to decrease amount of daily carbohydrate intake and daily calories and increase amount of plant-based protein in her diet. 

## 2022-09-25 NOTE — Patient Instructions (Signed)

## 2022-09-25 NOTE — Progress Notes (Signed)
Lindsay Travis 62 y.o.   Chief Complaint  Patient presents with   Follow-up    66mth f/u appt, cramping in her hands, pain under her shoulder blade    HISTORY OF PRESENT ILLNESS: This is a 62y.o. female here for 368-monthollow-up of hypertension diabetes and dyslipidemia. Also complaining of cramping in her hands send pain under shoulder blade Wt Readings from Last 3 Encounters:  09/25/22 226 lb 6 oz (102.7 kg)  06/19/22 223 lb 2 oz (101.2 kg)  05/30/22 218 lb (98.9 kg)   BP Readings from Last 3 Encounters:  09/25/22 (!) 150/92  06/19/22 136/84  05/30/22 122/72      HPI   Prior to Admission medications   Medication Sig Start Date End Date Taking? Authorizing Provider  amLODipine (NORVASC) 5 MG tablet Take 1 tablet (5 mg total) by mouth daily. 03/19/22 03/14/23 Yes Othman Masur, MiInes BloomerMD  Ascorbic Acid (VITAMIN C) 1000 MG tablet Take 1,000 mg by mouth 2 (two) times a week.   Yes [provider]  aspirin 81 MG EC tablet Take 1 tablet (81 mg total) by mouth daily. 03/20/18  Yes McFrann RiderNP  blood glucose meter kit and supplies Per insurance preference. Check daily fasting blood glucose. (Dx. E11.9). 03/20/22  Yes Soliyana Mcchristian, MiInes BloomerMD  dapagliflozin propanediol (FARXIGA) 10 MG TABS tablet Take 1 tablet (10 mg total) by mouth daily before breakfast. 06/19/22 06/14/23 Yes Omolara Carol, MiInes BloomerMD  glipiZIDE (GLUCOTROL) 5 MG tablet Take 1 tablet (5 mg total) by mouth 2 (two) times daily before a meal. 03/20/22 03/15/23 Yes Jayvian Escoe, MiInes BloomerMD  Omega-3 Fatty Acids (OMEGA-3 FISH OIL PO) Take 1 capsule by mouth in the morning.   Yes [provider]    Allergies  Allergen Reactions   Lisinopril Other (See Comments)    Did not feel good   Losartan Other (See Comments)    headache   Aspirin Other (See Comments)    Abdominal pain only 325 mg     Patient Active Problem List   Diagnosis Date Noted   Dyslipidemia associated with type 2 diabetes  mellitus ([facility]/07/2022   Statin myopathy 08/01/2021   Obesity, diabetes, and hypertension syndrome ([facility]05/11/2020   Morbid obesity ([facility]/05/2020   Dyslipidemia    Type 2 diabetes mellitus with hyperglycemia, without long-term current use of insulin ([facility]11/28/2016   Varicose veins of lower extremities with other complications 0680/11/4915 Fibroid, uterus 04/18/2011   Hypertension associated with diabetes ([facility]08/05/2011    Past Medical History:  Diagnosis Date   Anemia    Arthritis    bilateral kneed and lower legs   Clotting disorder ([facility]   Diabetes mellitus    recent high blood sugar and has RX but not taking  med -b/c she doesn't think she  is diabetic, just had lots of sugar in her diet when dx'd.-on meds   Hx of adenomatous polyp of colon 07/2021   Repeat colonoscopy 2029   Hypertension    on meds   Shortness of breath    on exertion- has low hgb   [facility]    Past Surgical History:  Procedure Laterality Date   ABDOMINAL HYSTERECTOMY  09/11/2011   Procedure: HYSTERECTOMY ABDOMINAL;  Surgeon: BeFrederico HammanMD;  [facility];  Service: Gynecology;  Laterality: N/A;   COLONOSCOPY WITH PROPOFOL N/A 07/16/2021   Procedure: COLONOSCOPY WITH PROPOFOL;  Surgeon: GeGatha MayerMD;  [facility];  Service: Endoscopy;  Laterality: N/A;   POLYPECTOMY  07/16/2021   Procedure: POLYPECTOMY;  Surgeon: Gatha Mayer, MD;  Location: [facility];  Service: Endoscopy;;    Social History   Socioeconomic History   Marital status: Divorced    Spouse name: Not on file   Number of children: Not on file   Years of education: Not on file   Highest education level: Not on file  Occupational History   Not on file  Tobacco Use   Smoking status: Never   Smokeless tobacco: Never  Vaping Use   Vaping Use: Never used  Substance and Sexual Activity   Alcohol use: No   Drug use: No   Sexual activity: Never  Other Topics Concern   Not on  file  Social History Narrative   Not on file   Social Determinants of Health   Financial Resource Strain: Not on file  Food Insecurity: Not on file  Transportation Needs: Not on file  Physical Activity: Not on file  Stress: Not on file  Social Connections: Not on file  Intimate Partner Violence: Not on file    Family History  Problem Relation Age of Onset   Hypertension Other    Colon polyps Neg Hx    Colon cancer Neg Hx    Heart disease Neg Hx    Esophageal cancer Neg Hx    Rectal cancer Neg Hx    Stomach cancer Neg Hx      Review of Systems  Constitutional: Negative.  Negative for chills and fever.  HENT: Negative.  Negative for congestion and sore throat.   Respiratory: Negative.  Negative for cough and shortness of breath.   Cardiovascular: Negative.  Negative for chest pain and palpitations.  Gastrointestinal:  Negative for abdominal pain, diarrhea, nausea and vomiting.  Genitourinary: Negative.   Skin:  Positive for rash (Face and neck).  Neurological: Negative.  Negative for dizziness and headaches.  All other systems reviewed and are negative.  Today's Vitals   09/25/22 1353  BP: (!) 150/92  Pulse: 79  Temp: 98.3 F (36.8 C)  TempSrc: Oral  SpO2: 97%  Weight: 226 lb 6 oz (102.7 kg)  Height: '5\' 6"'$  (1.676 m)   Body mass index is 36.54 kg/m.   Physical Exam Vitals reviewed.  Constitutional:      Appearance: Normal appearance.  HENT:     Head: Normocephalic.  Eyes:     Extraocular Movements: Extraocular movements intact.     Pupils: Pupils are equal, round, and reactive to light.  Cardiovascular:     Rate and Rhythm: Normal rate and regular rhythm.     Pulses: Normal pulses.     Heart sounds: Normal heart sounds.  Pulmonary:     Effort: Pulmonary effort is normal.     Breath sounds: Normal breath sounds.  Musculoskeletal:     Comments: Tenderness to right scapular area Hands: No deformities.  No inflammation.  Good grip.  No abnormal finding.   Skin:    General: Skin is warm and dry.     Findings: Rash present.     Comments: Nonspecific rash to face and neck  Neurological:     Mental Status: She is alert and oriented to person, place, and time.  Psychiatric:        Mood and Affect: Mood normal.        Behavior: Behavior normal.    DG Chest 2 View  Result Date: 09/25/2022 CLINICAL DATA:  Right-sided scapular pain. Anterior mid chest mass/lump. EXAM: CHEST - 2 VIEW COMPARISON:  Chest radiograph 07/16/2021 FINDINGS: The cardiomediastinal silhouette is normal. There is no focal consolidation or pulmonary edema. There is no pleural effusion or pneumothorax There is no acute osseous abnormality. No soft tissue mass lesion is identified. IMPRESSION: 1. No radiographic evidence of acute cardiopulmonary process. 2. No soft tissue mass lesion is identified. Consider CT for better evaluation as indicated. Electronically Signed   By: Valetta Mole M.D.   On: 09/25/2022 14:38     ASSESSMENT & PLAN: A total of 45 minutes was spent with the patient and counseling/coordination of care regarding preparing for this visit, review of most recent office visit notes, review of multiple chronic medical conditions under management, review of most recent blood work results including interpretation of today's hemoglobin A1c, review of all medications, education on nutrition, cardiovascular risks associated with hypertension and diabetes, need for blood work and chest x-ray today, prognosis, documentation, and need for follow-up.  Problem List Items Addressed This Visit       Cardiovascular and Mediastinum   Hypertension associated with diabetes ([place]) - Primary    Elevated blood pressure reading in the office. Recommend to continue amlodipine 5 mg daily and will start valsartan 80 mg daily. Cardiovascular risk associated with hypertension and diabetes discussed Diet and nutrition discussed. Well-controlled diabetes with hemoglobin A1c of 6.9 Continue  Farxiga 10 mg daily and glipizide 5 mg twice a day Follow-up in 3 months       Relevant Medications   valsartan (DIOVAN) 80 MG tablet   Other Relevant Orders   POCT glycosylated hemoglobin (Hb A1C) (Completed)   Urine Microalbumin w/creat. ratio   Comprehensive metabolic panel   Lipid panel   CBC with Differential/Platelet     Endocrine   Dyslipidemia associated with type 2 diabetes mellitus ([place])    Stable.  Diet and nutrition discussed. Intolerant to statins. Lipid profile done today.      Relevant Medications   valsartan (DIOVAN) 80 MG tablet     Musculoskeletal and Integument   Statin myopathy   Rash and nonspecific skin eruption   Relevant Orders   Ambulatory referral to Dermatology     Other   Morbid obesity ([place])    Diet and nutrition discussed. Advised to decrease amount of daily carbohydrate intake and daily calories and increase amount of plant based protein in her diet.      Musculoskeletal back pain    Mechanical in nature.  Mostly localized to right scapular area.  Pain management discussed.  No red flag signs or symptoms. Advised to use heat pad on and off during the day May use Tylenol and or Advil for pain management Chest x-ray done today      Relevant Orders   DG Chest 2 View   Patient Instructions  Diabetes Mellitus and Nutrition, Adult When you have diabetes, or diabetes mellitus, it is very important to have healthy eating habits because your blood sugar (glucose) levels are greatly affected by what you eat and drink. Eating healthy foods in the right amounts, at about the same times every day, can help you: Manage your blood glucose. Lower your risk of heart disease. Improve your blood pressure. Reach or maintain a healthy weight. What can affect my meal plan? Every person with diabetes is different, and each person has different needs for a meal plan. Your health care provider may recommend that you work with a dietitian to make a meal  plan  that is best for you. Your meal plan may vary depending on factors such as: The calories you need. The medicines you take. Your weight. Your blood glucose, blood pressure, and cholesterol levels. Your activity level. Other health conditions you have, such as heart or kidney disease. How do carbohydrates affect me? Carbohydrates, also called carbs, affect your blood glucose level more than any other type of food. Eating carbs raises the amount of glucose in your blood. It is important to know how many carbs you can safely have in each meal. This is different for every person. Your dietitian can help you calculate how many carbs you should have at each meal and for each snack. How does alcohol affect me? Alcohol can cause a decrease in blood glucose (hypoglycemia), especially if you use insulin or take certain diabetes medicines by mouth. Hypoglycemia can be a life-threatening condition. Symptoms of hypoglycemia, such as sleepiness, dizziness, and confusion, are similar to symptoms of having too much alcohol. Do not drink alcohol if: Your health care provider tells you not to drink. You are pregnant, may be pregnant, or are planning to become pregnant. If you drink alcohol: Limit how much you have to: 0-1 drink a day for women. 0-2 drinks a day for men. Know how much alcohol is in your drink. In the [place] one drink equals one 12 oz bottle of beer (355 mL), one 5 oz glass of wine (148 mL), or one 1 oz glass of hard liquor (44 mL). Keep yourself hydrated with water, diet soda, or unsweetened iced tea. Keep in mind that regular soda, juice, and other mixers may contain a lot of sugar and must be counted as carbs. What are tips for following this plan?  Reading food labels Start by checking the serving size on the Nutrition Facts label of packaged foods and drinks. The number of calories and the amount of carbs, fats, and other nutrients listed on the label are based on one serving of the item.  Many items contain more than one serving per package. Check the total grams (g) of carbs in one serving. Check the number of grams of saturated fats and trans fats in one serving. Choose foods that have a low amount or none of these fats. Check the number of milligrams (mg) of salt (sodium) in one serving. Most people should limit total sodium intake to less than 2,300 mg per day. Always check the nutrition information of foods labeled as "low-fat" or "nonfat." These foods may be higher in added sugar or refined carbs and should be avoided. Talk to your dietitian to identify your daily goals for nutrients listed on the label. Shopping Avoid buying canned, pre-made, or processed foods. These foods tend to be high in fat, sodium, and added sugar. Shop around the outside edge of the grocery store. This is where you will most often find fresh fruits and vegetables, bulk grains, fresh meats, and fresh dairy products. Cooking Use low-heat cooking methods, such as baking, instead of high-heat cooking methods, such as deep frying. Cook using healthy oils, such as olive, canola, or sunflower oil. Avoid cooking with butter, cream, or high-fat meats. Meal planning Eat meals and snacks regularly, preferably at the same times every day. Avoid going long periods of time without eating. Eat foods that are high in fiber, such as fresh fruits, vegetables, beans, and whole grains. Eat 4-6 oz (112-168 g) of lean protein each day, such as lean meat, chicken, fish, eggs, or tofu. One  ounce (oz) (28 g) of lean protein is equal to: 1 oz (28 g) of meat, chicken, or fish. 1 egg.  cup (62 g) of tofu. Eat some foods each day that contain healthy fats, such as avocado, nuts, seeds, and fish. What foods should I eat? Fruits Berries. Apples. Oranges. Peaches. Apricots. Plums. Grapes. Mangoes. Papayas. Pomegranates. Kiwi. Cherries. Vegetables Leafy greens, including lettuce, spinach, kale, chard, collard greens, mustard  greens, and cabbage. Beets. Cauliflower. Broccoli. Carrots. Green beans. Tomatoes. Peppers. Onions. Cucumbers. Brussels sprouts. Grains Whole grains, such as whole-wheat or whole-grain bread, crackers, tortillas, cereal, and pasta. Unsweetened oatmeal. Quinoa. Brown or wild rice. Meats and other proteins Seafood. Poultry without skin. Lean cuts of poultry and beef. Tofu. Nuts. Seeds. Dairy Low-fat or fat-free dairy products such as milk, yogurt, and cheese. The items listed above may not be a complete list of foods and beverages you can eat and drink. Contact a dietitian for more information. What foods should I avoid? Fruits Fruits canned with syrup. Vegetables Canned vegetables. Frozen vegetables with butter or cream sauce. Grains Refined white flour and flour products such as bread, pasta, snack foods, and cereals. Avoid all processed foods. Meats and other proteins Fatty cuts of meat. Poultry with skin. Breaded or fried meats. Processed meat. Avoid saturated fats. Dairy Full-fat yogurt, cheese, or milk. Beverages Sweetened drinks, such as soda or iced tea. The items listed above may not be a complete list of foods and beverages you should avoid. Contact a dietitian for more information. Questions to ask a health care provider Do I need to meet with a certified diabetes care and education specialist? Do I need to meet with a dietitian? What number can I call if I have questions? When are the best times to check my blood glucose? Where to find more information: American Diabetes Association: diabetes.org Academy of Nutrition and Dietetics: eatright.[employer] of Diabetes and Digestive and Kidney Diseases: Amen[employer].is Association of Diabetes Care & Education Specialists: diabeteseducator.org Summary It is important to have healthy eating habits because your blood sugar (glucose) levels are greatly affected by what you eat and drink. It is important to use alcohol  carefully. A healthy meal plan will help you manage your blood glucose and lower your risk of heart disease. Your health care provider may recommend that you work with a dietitian to make a meal plan that is best for you. This information is not intended to replace advice given to you by your health care provider. Make sure you discuss any questions you have with your health care provider. Document Revised: 03/29/2020 Document Reviewed: 03/29/2020 Elsevier Patient Education  [place], MD [facility]

## 2022-09-27 ENCOUNTER — Telehealth: Payer: Self-pay | Admitting: Emergency Medicine

## 2022-09-27 ENCOUNTER — Other Ambulatory Visit (HOSPITAL_COMMUNITY): Payer: Self-pay

## 2022-09-27 MED ORDER — VALSARTAN 80 MG PO TABS: 80.0000 mg | ORAL_TABLET | Freq: Every day | ORAL | 3 refills | Status: DC

## 2022-09-27 NOTE — Telephone Encounter (Signed)
Ran test claim, no PA needed. Called [facility] and they are not contracted with patient's insurance. Prescription will have to be filled at another pharmacy. Pharmacy staff thinks probably [facility].

## 2022-09-27 NOTE — Telephone Encounter (Signed)
Need PA for Valsartan.Marland KitchenJohny Chess

## 2022-09-27 NOTE — Telephone Encounter (Signed)
Called pt inform her prescription is not contracted w/ [facility]. [facility] [address].Marland KitchenVerl Dicker b

## 2022-09-27 NOTE — Telephone Encounter (Signed)
Pt's INS is requiring a PA for their valsartan (DIOVAN) 80 MG tablet medication. Please start PA asap

## 2022-10-01 ENCOUNTER — Telehealth: Payer: Self-pay | Admitting: Emergency Medicine

## 2022-10-01 DIAGNOSIS — I152 Hypertension secondary to endocrine disorders: Secondary | ICD-10-CM

## 2022-10-01 MED ORDER — AMLODIPINE BESYLATE 5 MG PO TABS: 5.0000 mg | ORAL_TABLET | Freq: Every day | ORAL | 3 refills | Status: DC

## 2022-10-01 NOTE — Telephone Encounter (Signed)
Patient would like her prescription for amlodipine '5mg'$  sent to the [facility] on [facility].

## 2022-10-01 NOTE — Telephone Encounter (Signed)
Prescription sent to requested pharmacy. 

## 2022-10-03 ENCOUNTER — Other Ambulatory Visit: Payer: Self-pay

## 2022-10-03 ENCOUNTER — Encounter (HOSPITAL_COMMUNITY): Payer: Self-pay | Admitting: Emergency Medicine

## 2022-10-03 ENCOUNTER — Ambulatory Visit (HOSPITAL_COMMUNITY)
Admission: EM | Admit: 2022-10-03 | Discharge: 2022-10-03 | Disposition: A | Discharge status: Home / Self Care | Payer: 59 | Attending: Family Medicine | Admitting: Family Medicine

## 2022-10-03 DIAGNOSIS — K047 Periapical abscess without sinus: Secondary | ICD-10-CM

## 2022-10-03 MED ORDER — AMOXICILLIN-POT CLAVULANATE 875-125 MG PO TABS: 1.0000 | ORAL_TABLET | Freq: Two times a day (BID) | ORAL | 0 refills | Status: AC

## 2022-10-03 MED ORDER — TRAMADOL HCL 50 MG PO TABS: 50.0000 mg | ORAL_TABLET | Freq: Four times a day (QID) | ORAL | 0 refills | Status: DC | PRN

## 2022-10-03 NOTE — ED Triage Notes (Addendum)
For 2 days has had dental pain.  Pain on top right of mouth.  Patient reports taking amoxicillin 500 mg taken x 2 -states this was left over from November.  Has not taken any tylenol or ibuprofen .  Right side of face looks swollen.  Complains of pain to right side of head.    Patient does not have a dentist-either dentist is out of network or they claim not to take insurance.

## 2022-10-03 NOTE — Discharge Instructions (Signed)
Take amoxicillin-clavulanate 875 mg--1 tab twice daily with food for 7 days  Take tramadol 50 mg-- 1 tablet every 6 hours as needed for pain.  This medication can make you sleepy or dizzy

## 2022-10-03 NOTE — ED Provider Notes (Signed)
[place]    CSN: [id number] Arrival date & time: 10/03/22  1159      History   Chief Complaint Chief Complaint  Patient presents with   Dental Pain    HPI Lindsay Travis is a 62 y.o. female.    Dental Pain  Here for pain and swelling of right upper lip and right cheek. Began about 3 days ago. No f/c/vomiting.  Last eGFR was 58 in November 2022    Past Medical History:  Diagnosis Date   Anemia    Arthritis    bilateral kneed and lower legs   Clotting disorder (HCC)    Diabetes mellitus    recent high blood sugar and has RX but not taking  med -b/c she doesn't think she  is diabetic, just had lots of sugar in her diet when dx'd.-on meds   Hx of adenomatous polyp of colon 07/2021   Repeat colonoscopy 2029   Hypertension    on meds   Shortness of breath    on exertion- has low hgb   Sickle cell trait [facility])     Patient Active Problem List   Diagnosis Date Noted   Musculoskeletal back pain 09/25/2022   Rash and nonspecific skin eruption 09/25/2022   Dyslipidemia associated with type 2 diabetes mellitus ([place]) 06/19/2022   Statin myopathy 08/01/2021   Obesity, diabetes, and hypertension syndrome ([place]) 01/09/2021   Morbid obesity ([place]) 09/18/2019   Dyslipidemia    Type 2 diabetes mellitus with hyperglycemia, without long-term current use of insulin ([place]) 08/07/2015   Varicose veins of lower extremities with other complications 97/10/6376   Fibroid, uterus 04/18/2011   Hypertension associated with diabetes ([place]) 04/18/2011    Past Surgical History:  Procedure Laterality Date   ABDOMINAL HYSTERECTOMY  09/11/2011   Procedure: HYSTERECTOMY ABDOMINAL;  Surgeon: Frederico Hamman, MD;  Location: [facility];  Service: Gynecology;  Laterality: N/A;   COLONOSCOPY WITH PROPOFOL N/A 07/16/2021   Procedure: COLONOSCOPY WITH PROPOFOL;  Surgeon: Gatha Mayer, MD;  Location: [facility];  Service: Endoscopy;  Laterality: N/A;   POLYPECTOMY  07/16/2021    Procedure: POLYPECTOMY;  Surgeon: Gatha Mayer, MD;  [facility]: [facility];  Service: Endoscopy;;    OB History     Gravida  5   Para  5   Term  5   Preterm  0   AB  0   Living  5      SAB  0   IAB  0   Ectopic  0   Multiple  0   Live Births               Home Medications    Prior to Admission medications   Medication Sig Start Date End Date Taking? Authorizing Provider  amoxicillin-clavulanate (AUGMENTIN) 875-125 MG tablet Take 1 tablet by mouth 2 (two) times daily for 7 days. 10/03/22 10/10/22 Yes Gelisa Tieken, Gwenlyn Perking, MD  traMADol (ULTRAM) 50 MG tablet Take 1 tablet (50 mg total) by mouth every 6 (six) hours as needed (pain). 10/03/22  Yes Barrett Henle, MD  amLODipine (NORVASC) 5 MG tablet Take 1 tablet (5 mg total) by mouth daily. 10/01/22 09/26/23  Horald Pollen, MD  Ascorbic Acid (VITAMIN C) 1000 MG tablet Take 1,000 mg by mouth 2 (two) times a week.    [provider]  aspirin 81 MG EC tablet Take 1 tablet (81 mg total) by mouth daily. 03/20/18   Frann Rider, NP  blood glucose  meter kit and supplies Per insurance preference. Check daily fasting blood glucose. (Dx. E11.9). 03/20/22   Horald Pollen, MD  dapagliflozin propanediol (FARXIGA) 10 MG TABS tablet Take 1 tablet (10 mg total) by mouth daily before breakfast. 06/19/22 06/14/23  Horald Pollen, MD  glipiZIDE (GLUCOTROL) 5 MG tablet Take 1 tablet (5 mg total) by mouth 2 (two) times daily before a meal. 03/20/22 03/15/23  Sagardia, Ines Bloomer, MD  Omega-3 Fatty Acids (OMEGA-3 FISH OIL PO) Take 1 capsule by mouth in the morning.    [provider]  valsartan (DIOVAN) 80 MG tablet Take 1 tablet (80 mg total) by mouth daily. 09/27/22   Horald Pollen, MD    Family History Family History  Problem Relation Age of Onset   Hypertension Other    Colon polyps Neg Hx    Colon cancer Neg Hx    Heart disease Neg Hx    Esophageal cancer Neg Hx    Rectal cancer Neg  Hx    Stomach cancer Neg Hx     Social History Social History   Tobacco Use   Smoking status: Never   Smokeless tobacco: Never  Vaping Use   Vaping Use: Never used  Substance Use Topics   Alcohol use: No   Drug use: No     Allergies   Lisinopril, Losartan, and Aspirin   Review of Systems Review of Systems   Physical Exam Triage Vital Signs ED Triage Vitals  Enc Vitals Group     BP 10/03/22 1405 (!) 141/86     Pulse Rate 10/03/22 1405 95     Resp 10/03/22 1405 (!) 22     Temp 10/03/22 1405 99.3 F (37.4 C)     Temp Source 10/03/22 1405 Oral     SpO2 10/03/22 1405 96 %     Weight --      Height --      Head Circumference --      Peak Flow --      Pain Score 10/03/22 1403 10     Pain Loc --      Pain Edu? --      Excl. in [place]? --    No data found.  Updated Vital Signs BP (!) 141/86 (BP Location: Left Arm) Comment (BP Location): large cuff  Pulse 95   Temp 99.3 F (37.4 C) (Oral)   Resp (!) 22   LMP 09/10/2011   SpO2 96%   Visual Acuity Right Eye Distance:   Left Eye Distance:   Bilateral Distance:    Right Eye Near:   Left Eye Near:    Bilateral Near:     Physical Exam Vitals reviewed.  Constitutional:      General: She is not in acute distress.    Appearance: She is normal weight. She is not ill-appearing, toxic-appearing or diaphoretic.  HENT:     Head:     Comments: There is swelling over right nasolabial fold and anterior right cheek    Mouth/Throat:     Mouth: Mucous membranes are moist.     Comments: There is swelling on the anterior aspect of the anterior dental ridge on right. Dental caries present Cardiovascular:     Rate and Rhythm: Normal rate and regular rhythm.  Pulmonary:     Effort: Pulmonary effort is normal.     Breath sounds: Normal breath sounds.  Skin:    Coloration: Skin is not pale.  Neurological:     Mental Status:  She is alert and oriented to person, place, and time.      UC Treatments / Results  Labs (all  labs ordered are listed, but only abnormal results are displayed) Labs Reviewed - No data to display  EKG   Radiology No results found.  Procedures Procedures (including critical care time)  Medications Ordered in UC Medications - No data to display  Initial Impression / Assessment and Plan / UC Course  I have reviewed the triage vital signs and the nursing notes.  Pertinent labs & imaging results that were available during my care of the patient were reviewed by me and considered in my medical decision making (see chart for details).        Augmentin and tramadol are sent in for her infection and for the symptoms.  List of low-cost dental providers provided at discharge Final Clinical Impressions(s) / UC Diagnoses   Final diagnoses:  Dental infection     Discharge Instructions      Take amoxicillin-clavulanate 875 mg--1 tab twice daily with food for 7 days  Take tramadol 50 mg-- 1 tablet every 6 hours as needed for pain.  This medication can make you sleepy or dizzy       ED Prescriptions     Medication Sig Dispense Auth. Provider   amoxicillin-clavulanate (AUGMENTIN) 875-125 MG tablet Take 1 tablet by mouth 2 (two) times daily for 7 days. 14 tablet Agness Sibrian, Gwenlyn Perking, MD   traMADol (ULTRAM) 50 MG tablet Take 1 tablet (50 mg total) by mouth every 6 (six) hours as needed (pain). 12 tablet Diya Gervasi, Gwenlyn Perking, MD      I have reviewed the PDMP during this encounter.   Barrett Henle, MD 10/03/22 [phone]

## 2022-10-17 ENCOUNTER — Other Ambulatory Visit: Payer: Self-pay | Admitting: Emergency Medicine

## 2022-10-17 ENCOUNTER — Telehealth: Payer: Self-pay | Admitting: Emergency Medicine

## 2022-10-17 DIAGNOSIS — E1159 Type 2 diabetes mellitus with other circulatory complications: Secondary | ICD-10-CM

## 2022-10-17 MED ORDER — AMLODIPINE BESYLATE 5 MG PO TABS: 5.0000 mg | ORAL_TABLET | Freq: Every day | ORAL | 3 refills | Status: DC

## 2022-10-17 MED ORDER — DAPAGLIFLOZIN PROPANEDIOL 10 MG PO TABS: 10.0000 mg | ORAL_TABLET | Freq: Every day | ORAL | 3 refills | Status: DC

## 2022-10-17 NOTE — Telephone Encounter (Signed)
New medication sent to patient requested pharmacy  

## 2022-10-17 NOTE — Telephone Encounter (Signed)
Find out what the alternatives are.  Thanks.

## 2022-10-17 NOTE — Telephone Encounter (Signed)
Caller & Relationship to patient: Self  Call back number: [phone]   Date of last office visit: 1.17.24  Date of next office visit: 4.16.24  Medication(s) to be refilled:  valsartan (DIOVAN) 80 MG tablet   dapagliflozin propanediol (FARXIGA) 10 MG TABS tablet   amLODipine (NORVASC) 5 MG tablet    Preferred Pharmacy:   [id number] Phone: [phone] Fax: [phone]

## 2022-10-18 ENCOUNTER — Telehealth: Payer: Self-pay | Admitting: *Deleted

## 2022-10-18 ENCOUNTER — Other Ambulatory Visit: Payer: Self-pay | Admitting: Emergency Medicine

## 2022-10-18 NOTE — Telephone Encounter (Signed)
PA for [facility] submitted, awaiting response Key: [id number]

## 2022-10-19 ENCOUNTER — Encounter (HOSPITAL_COMMUNITY): Payer: Self-pay

## 2022-10-19 ENCOUNTER — Ambulatory Visit (HOSPITAL_COMMUNITY)
Admission: EM | Admit: 2022-10-19 | Discharge: 2022-10-19 | Disposition: A | Discharge status: Home / Self Care | Payer: 59 | Attending: Family Medicine | Admitting: Family Medicine

## 2022-10-19 DIAGNOSIS — R0789 Other chest pain: Secondary | ICD-10-CM

## 2022-10-19 MED ORDER — PREDNISONE 20 MG PO TABS: 40.0000 mg | ORAL_TABLET | Freq: Every day | ORAL | 0 refills | Status: DC

## 2022-10-19 NOTE — Telephone Encounter (Signed)
Find alternatives.  Thanks.

## 2022-10-19 NOTE — Telephone Encounter (Signed)
Thanks.  Looks like [facility] was approved.

## 2022-10-19 NOTE — ED Triage Notes (Signed)
Pt is here for chest pain  x 2days . Pt states she fill pain in the middle of back

## 2022-10-19 NOTE — Discharge Instructions (Addendum)
Be aware, your blood sugar level may increase while taking prednisone. Please monitor closely.

## 2022-10-21 NOTE — ED Provider Notes (Signed)
[name]   [id number] 10/19/22 Arrival Time: [id number]  ASSESSMENT & PLAN:  1. Chest wall pain    No suspicion of cardiac etiology. Point tender to ant chest wall that reproduces her described pain. Trial of : Meds ordered this encounter  Medications   predniSONE (DELTASONE) 20 MG tablet    Sig: Take 2 tablets (40 mg total) by mouth daily.    Dispense:  10 tablet    Refill:  0  Wants to avoid NSAIDs.  Chest pain precautions given. Reviewed expectations re: course of current medical issues. Questions answered. Outlined signs and symptoms indicating need for more acute intervention. Patient verbalized understanding. After Visit Summary given.   SUBJECTIVE:  History from: patient. Lindsay Travis is a 62 y.o. female who presents with complaint of anterior upper non-radiating chest pain; gradual onset; past two days; no injury/trauma; no assoc SOB. Occas with back pain but is not related to current pain. Denies: dyspnea, exertional chest pressure/discomfort, fatigue, irregular heart beat, lower extremity edema, near-syncope, orthopnea, palpitations, paroxysmal nocturnal dyspnea, and syncope. Certain movements does exacerbate pain. Does not wake her at night. Tylenol with mild help.  Social History   Tobacco Use  Smoking Status Never  Smokeless Tobacco Never   Social History   Substance and Sexual Activity  Alcohol Use No    OBJECTIVE:  Vitals:   10/19/22 1834  BP: 137/85  Pulse: 75  Resp: 12  Temp: 98.3 F (36.8 C)  SpO2: 96%    General appearance: alert, oriented, no acute distress Eyes: PERRLA; EOMI; conjunctivae normal HENT: normocephalic; atraumatic Neck: supple with FROM Lungs: without labored respirations; speaks full sentences without difficulty; CTAB Heart: regular rate and rhythm Chest Wall: without tenderness to palpation Abdomen: soft, non-tender; no guarding or rebound tenderness Extremities: without edema; without calf swelling or  tenderness; symmetrical without gross deformities Skin: warm and dry; without rash or lesions Neuro: normal gait Psychological: alert and cooperative; normal mood and affect   Allergies  Allergen Reactions   Lisinopril Other (See Comments)    Did not feel good   Losartan Other (See Comments)    headache   Aspirin Other (See Comments)    Abdominal pain only 325 mg     Past Medical History:  Diagnosis Date   Anemia    Arthritis    bilateral kneed and lower legs   Clotting disorder ([place])    Diabetes mellitus    recent high blood sugar and has RX but not taking  med -b/c she doesn't think she  is diabetic, just had lots of sugar in her diet when dx'd.-on meds   Hx of adenomatous polyp of colon 07/2021   Repeat colonoscopy 2029   Hypertension    on meds   Shortness of breath    on exertion- has low hgb   Sickle cell trait ([place])    Social History   Socioeconomic History   Marital status: Divorced    Spouse name: Not on file   Number of children: Not on file   Years of education: Not on file   Highest education level: Not on file  Occupational History   Not on file  Tobacco Use   Smoking status: Never   Smokeless tobacco: Never  Vaping Use   Vaping Use: Never used  Substance and Sexual Activity   Alcohol use: No   Drug use: No   Sexual activity: Never  Other Topics Concern   Not on file  Social History Narrative  Not on file   Social Determinants of Health   Financial Resource Strain: Not on file  Food Insecurity: Not on file  Transportation Needs: Not on file  Physical Activity: Not on file  Stress: Not on file  Social Connections: Not on file  Intimate Partner Violence: Not on file   Family History  Problem Relation Age of Onset   Hypertension Other    Colon polyps Neg Hx    Colon cancer Neg Hx    Heart disease Neg Hx    Esophageal cancer Neg Hx    Rectal cancer Neg Hx    Stomach cancer Neg Hx    Past Surgical History:  Procedure Laterality  Date   ABDOMINAL HYSTERECTOMY  09/11/2011   Procedure: HYSTERECTOMY ABDOMINAL;  Surgeon: Frederico Hamman, MD;  Location: [facility];  Service: Gynecology;  Laterality: N/A;   COLONOSCOPY WITH PROPOFOL N/A 07/16/2021   Procedure: COLONOSCOPY WITH PROPOFOL;  Surgeon: Gatha Mayer, MD;  [facility];  Service: Endoscopy;  Laterality: N/A;   POLYPECTOMY  07/16/2021   Procedure: POLYPECTOMY;  Surgeon: Gatha Mayer, MD;  Location: [facility];  Service: Endoscopy;;      Vanessa Kick, MD 10/21/22 1000

## 2022-10-21 NOTE — Telephone Encounter (Signed)
PA for [place] denied, appeal submitted,  Key: [id number]

## 2022-10-22 NOTE — Telephone Encounter (Signed)
Appeal for [facility] approved

## 2022-11-05 ENCOUNTER — Emergency Department (HOSPITAL_COMMUNITY)
Admission: EM | Admit: 2022-11-05 | Discharge: 2022-11-05 | Disposition: A | Discharge status: Home / Self Care | Payer: 59 | Attending: Emergency Medicine | Admitting: Emergency Medicine

## 2022-11-05 ENCOUNTER — Other Ambulatory Visit: Payer: Self-pay

## 2022-11-05 ENCOUNTER — Encounter (HOSPITAL_COMMUNITY): Payer: Self-pay | Admitting: Emergency Medicine

## 2022-11-05 DIAGNOSIS — Z7984 Long term (current) use of oral hypoglycemic drugs: Secondary | ICD-10-CM | POA: Diagnosis not present

## 2022-11-05 DIAGNOSIS — K219 Gastro-esophageal reflux disease without esophagitis: Secondary | ICD-10-CM

## 2022-11-05 DIAGNOSIS — I10 Essential (primary) hypertension: Secondary | ICD-10-CM | POA: Diagnosis not present

## 2022-11-05 DIAGNOSIS — Z79899 Other long term (current) drug therapy: Secondary | ICD-10-CM | POA: Insufficient documentation

## 2022-11-05 DIAGNOSIS — R5383 Other fatigue: Secondary | ICD-10-CM | POA: Diagnosis present

## 2022-11-05 DIAGNOSIS — Z7982 Long term (current) use of aspirin: Secondary | ICD-10-CM | POA: Insufficient documentation

## 2022-11-05 DIAGNOSIS — E119 Type 2 diabetes mellitus without complications: Secondary | ICD-10-CM | POA: Insufficient documentation

## 2022-11-05 DIAGNOSIS — R0789 Other chest pain: Secondary | ICD-10-CM

## 2022-11-05 LAB — CBC
HCT: 43.7 % (ref 36.0–46.0)
Hemoglobin: 14.2 g/dL (ref 12.0–15.0)
MCH: 27.5 pg (ref 26.0–34.0)
MCHC: 32.5 g/dL (ref 30.0–36.0)
MCV: 84.7 fL (ref 80.0–100.0)
Platelets: 217 10*3/uL (ref 150–400)
RBC: 5.16 MIL/uL — ABNORMAL HIGH (ref 3.87–5.11)
RDW: 13.5 % (ref 11.5–15.5)
WBC: 5.3 10*3/uL (ref 4.0–10.5)
nRBC: 0 % (ref 0.0–0.2)

## 2022-11-05 LAB — BASIC METABOLIC PANEL
Anion gap: 14 (ref 5–15)
BUN: 11 mg/dL (ref 6–20)
CO2: 23 mmol/L (ref 22–32)
Calcium: 9.2 mg/dL (ref 8.9–10.3)
Chloride: 105 mmol/L (ref 98–111)
Creatinine, Ser: 0.83 mg/dL (ref 0.44–1.00)
GFR, Estimated: >60 mL/min (ref >60)
Glucose, Bld: 133 mg/dL — ABNORMAL HIGH (ref 70–99)
Potassium: 3.4 mmol/L — ABNORMAL LOW (ref 3.5–5.1)
Sodium: 142 mmol/L (ref 135–145)

## 2022-11-05 LAB — I-STAT BETA HCG BLOOD, ED (MC, WL, AP ONLY): I-stat hCG, quantitative: <5 m[IU]/mL (ref <5)

## 2022-11-05 LAB — TROPONIN I (HIGH SENSITIVITY): Troponin I (High Sensitivity): 5 ng/L (ref <18)

## 2022-11-05 MED ORDER — FAMOTIDINE IN NACL 20-0.9 MG/50ML-% IV SOLN
20.0000 mg | Freq: Once | INTRAVENOUS | Status: AC
  Administered 2022-11-05: 20 mg via INTRAVENOUS
  Filled 2022-11-05: qty 50

## 2022-11-05 MED ORDER — ONDANSETRON HCL 4 MG/2ML IJ SOLN
4.0000 mg | Freq: Once | INTRAMUSCULAR | Status: AC
  Administered 2022-11-05: 4 mg via INTRAVENOUS
  Filled 2022-11-05: qty 2

## 2022-11-05 MED ORDER — FAMOTIDINE 20 MG PO TABS: 20.0000 mg | ORAL_TABLET | Freq: Two times a day (BID) | ORAL | 0 refills | Status: AC

## 2022-11-05 NOTE — Discharge Instructions (Addendum)
You were seen in the emergency department today for chest pain.  As we discussed your lab work, EKG, chest x-ray all looked reassuring today.  I think that your symptoms could likely been related to acid reflux.   I recommend monitoring your stress levels.  Continue to monitor how you are doing overall, and return to the emergency department for any new or worsening symptoms such as: Worsening pain or pain with exertion, difficulty breathing, sweating, or pain or swelling in your legs.

## 2022-11-05 NOTE — ED Triage Notes (Signed)
Pt reports that after 6pm she started feeling tired after she ate something.  "I just want to go home and go to sleep."  No neuro deficits noted, negative NIH, good/equal strength. She states she feel like she has something "stuck in my chest".

## 2022-11-05 NOTE — ED Provider Notes (Signed)
[place] Provider Note   CSN: [id number] Arrival date & time: 11/05/22  2005     History  Chief Complaint  Patient presents with   Fatigue    Lindsay Travis is a 62 y.o. female with history of hypertension, anemia, diabetes, sickle cell trait who presents the emergency department complaining of fatigue and a sensation of burning in her chest.  Patient states the symptoms started around 6 PM this evening after she ate something.  She says that she feels nauseous, has not vomited.  States that she feels like something is "stuck in her chest".  Pain does not radiate.  No diarrhea.  No fever, chills, cough.  HPI     Home Medications Prior to Admission medications   Medication Sig Start Date End Date Taking? Authorizing Provider  famotidine (PEPCID) 20 MG tablet Take 1 tablet (20 mg total) by mouth 2 (two) times daily. 11/05/22  Yes Elia Nunley T, PA-C  amLODipine (NORVASC) 5 MG tablet Take 1 tablet (5 mg total) by mouth daily. 10/17/22 10/12/23  Horald Pollen, MD  Ascorbic Acid (VITAMIN C) 1000 MG tablet Take 1,000 mg by mouth 2 (two) times a week.    [provider]  aspirin 81 MG EC tablet Take 1 tablet (81 mg total) by mouth daily. 03/20/18   Frann Rider, NP  blood glucose meter kit and supplies Per insurance preference. Check daily fasting blood glucose. (Dx. E11.9). 03/20/22   Horald Pollen, MD  FARXIGA 10 MG TABS tablet TAKE 1 TABLET BY MOUTH DAILY BEFORE BREAKFAST. 10/18/22   Horald Pollen, MD  glipiZIDE (GLUCOTROL) 5 MG tablet Take 1 tablet (5 mg total) by mouth 2 (two) times daily before a meal. 03/20/22 03/15/23  Sagardia, Ines Bloomer, MD  Omega-3 Fatty Acids (OMEGA-3 FISH OIL PO) Take 1 capsule by mouth in the morning.    [provider]  predniSONE (DELTASONE) 20 MG tablet Take 2 tablets (40 mg total) by mouth daily. 10/19/22   Vanessa Kick, MD  traMADol (ULTRAM) 50 MG tablet Take 1 tablet  (50 mg total) by mouth every 6 (six) hours as needed (pain). 10/03/22   Barrett Henle, MD      Allergies    Lisinopril, Losartan, and Aspirin    Review of Systems   Review of Systems  Constitutional:  Positive for fatigue. Negative for chills and fever.  Respiratory:  Negative for shortness of breath.   Cardiovascular:  Positive for chest pain. Negative for palpitations and leg swelling.  Gastrointestinal:  Positive for nausea. Negative for abdominal pain, diarrhea and vomiting.  All other systems reviewed and are negative.   Physical Exam Updated Vital Signs BP (!) 173/98   Pulse 80   Temp 98.3 F (36.8 C)   Resp 18   LMP 09/10/2011   SpO2 98%  Physical Exam Vitals and nursing note reviewed.  Constitutional:      General: She is awake.     Appearance: Normal appearance.     Comments: Appears fatigued  HENT:     Head: Normocephalic and atraumatic.  Eyes:     Conjunctiva/sclera: Conjunctivae normal.  Cardiovascular:     Rate and Rhythm: Normal rate and regular rhythm.  Pulmonary:     Effort: Pulmonary effort is normal. No respiratory distress.     Breath sounds: Normal breath sounds.  Abdominal:     General: There is no distension.     Palpations: Abdomen is soft.  Tenderness: There is no abdominal tenderness.  Musculoskeletal:     Right lower leg: No edema.     Left lower leg: No edema.  Skin:    General: Skin is warm and dry.  Neurological:     General: No focal deficit present.     ED Results / Procedures / Treatments   Labs (all labs ordered are listed, but only abnormal results are displayed) Labs Reviewed  BASIC METABOLIC PANEL - Abnormal; Notable for the following components:      Result Value   Potassium 3.4 (*)    Glucose, Bld 133 (*)    All other components within normal limits  CBC - Abnormal; Notable for the following components:   RBC 5.16 (*)    All other components within normal limits  CBG MONITORING, ED  I-STAT BETA HCG BLOOD,  ED ([facility], [facility], [facility] ONLY)  TROPONIN I (HIGH SENSITIVITY)    EKG EKG Interpretation  Date/Time:  Tuesday November 05 2022 20:14:22 EST Ventricular Rate:  81 PR Interval:  192 QRS Duration: 78 QT Interval:  394 QTC Calculation: 457 R Axis:   41 Text Interpretation: Normal sinus rhythm Possible Left atrial enlargement Cannot rule out Anterior infarct , age undetermined Abnormal ECG When compared with ECG of 17-Nov-2018 17:07, PREVIOUS ECG IS PRESENT Confirmed by Lacretia Leigh ([id number]) on 11/05/2022 9:09:20 PM  Radiology No results found.  Procedures Procedures    Medications Ordered in ED Medications  ondansetron (ZOFRAN) injection 4 mg (4 mg Intravenous Given 11/05/22 2222)  famotidine (PEPCID) IVPB 20 mg premix (0 mg Intravenous Stopped 11/05/22 2255)    ED Course/ Medical Decision Making/ A&P             HEART Score: 2                Medical Decision Making Amount and/or Complexity of Data Reviewed Labs: ordered.  Risk Prescription drug management.   This patient is a 62 y.o. female  who presents to the ED for concern of upper abdominal/chest discomfort and fatigue.   Differential diagnoses prior to evaluation: The emergent differential diagnosis includes, but is not limited to,  GERD/PUD, ACS, PE, gastroenteritis, gastroparesis. This is not an exhaustive differential.   Past Medical History / Co-morbidities: hypertension, anemia, diabetes, sickle cell trait   Additional history: Chart reviewed. Pertinent results include: Went to UC on 2/10 for chest wall pain, discharged with steroids.   Physical Exam: Physical exam performed. The pertinent findings include: Appears fatigued, but awake and cooperative. Initially hypertensive, but improved after lying in exam bed. No respiratory distress, clear lung sounds, normal oxygen saturation.  Abdomen soft and nontender.  Lab Tests/Imaging studies: I personally interpreted labs/imaging and the pertinent results include: No  leukocytosis, normal hemoglobin.  BMP unremarkable.  Troponin 5.  Cardiac monitoring: EKG obtained and interpreted by my attending physician which shows: normal sinus rhythm   Medications: I ordered medication including zofran and pepcid.  I have reviewed the patients home medicines and have made adjustments as needed. Upon reevaluation, symptoms have resolved and patient is ready to go home.   Disposition: After consideration of the diagnostic results and the patients response to treatment, I feel that emergency department workup does not suggest an emergent condition requiring admission or immediate intervention beyond what has been performed at this time. Patient is to be discharged with recommendation to follow up with PCP in regards to today's hospital visit. Chest pain is not likely of cardiac or pulmonary etiology d/t  presentation, Well's criteria for PE low risk, VSS, no tracheal deviation, no JVD or new murmur, RRR, breath sounds equal bilaterally, EKG without acute abnormalities, negative troponin, and negative CXR. Heart score of 2. Suspect symptoms related to reflux as symptoms resolved after antiemetics and pepcid. Pt has been advised to return to the ED if CP becomes exertional, associated with diaphoresis or nausea, radiates to left jaw/arm, worsens or becomes concerning in any way. Pt appears reliable for follow up and is agreeable to discharge.   Final Clinical Impression(s) / ED Diagnoses Final diagnoses:  Gastroesophageal reflux disease, unspecified whether esophagitis present  Burning in the chest    Rx / DC Orders ED Discharge Orders          Ordered    famotidine (PEPCID) 20 MG tablet  2 times daily        11/05/22 2303           Portions of this report may have been transcribed using voice recognition software. Every effort was made to ensure accuracy; however, inadvertent computerized transcription errors may be present.    Estill Cotta 11/05/22  2304    Lacretia Leigh, MD 11/07/22 1521

## 2022-11-06 ENCOUNTER — Telehealth: Payer: Self-pay

## 2022-11-06 NOTE — Transitions of Care (Post Inpatient/ED Visit) (Signed)
   11/06/2022  Name: Lindsay Travis MRN: [id number] DOB: [birth year]  Today's TOC FU Call Status: Today's TOC FU Call Status:: Successful TOC FU Call Competed TOC FU Call Complete Date: 11/06/22  Transition Care Management Follow-up Telephone Call Date of Discharge: 11/05/22 Discharge Facility[facility]) Type of Discharge: Inpatient Admission Primary Inpatient Discharge Diagnosis:: gastro esophageal reflux How have you been since you were released from the hospital?: Better Any questions or concerns?: No  Items Reviewed: Did you receive and understand the discharge instructions provided?: Yes Medications obtained and verified?: Yes (Medications Reviewed) Any new allergies since your discharge?: No Dietary orders reviewed?: Yes Do you have support at home?: Yes People in Home: spouse  Home Care and Equipment/Supplies: [place] Ordered?: NA Any new equipment or medical supplies ordered?: NA  Functional Questionnaire: Do you need assistance with bathing/showering or dressing?: No Do you need assistance with meal preparation?: No Do you need assistance with eating?: No Do you have difficulty maintaining continence: No Do you have difficulty managing or taking your medications?: No  Folllow up appointments reviewed: PCP Follow-up appointment confirmed?: [facility] Follow-up appointment confirmed?: NA Do you need transportation to your follow-up appointment?: No Do you understand care options if your condition(s) worsen?: Yes-patient verbalized understanding    [place], [place] Direct Dial [phone]

## 2022-11-20 ENCOUNTER — Telehealth: Payer: Self-pay | Admitting: Emergency Medicine

## 2022-11-20 DIAGNOSIS — E1165 Type 2 diabetes mellitus with hyperglycemia: Secondary | ICD-10-CM

## 2022-11-20 MED ORDER — GLIPIZIDE 5 MG PO TABS: 5.0000 mg | ORAL_TABLET | Freq: Two times a day (BID) | ORAL | 2 refills | Status: DC

## 2022-11-20 NOTE — Telephone Encounter (Signed)
Prescription Request  11/20/2022  LOV: 09/25/2022  What is the name of the medication or equipment? glipiZIDE (GLUCOTROL) 5 MG tablet   Have you contacted your pharmacy to request a refill? Yes   Which pharmacy would you like this sent to?   [id number] [id number]: [phone]: [phone]   Patient notified that their request is being sent to the clinical staff for review and that they should receive a response within 2 business days.   Please advise at Mobile [phone](mobile)

## 2022-11-20 NOTE — Telephone Encounter (Signed)
[facility]

## 2022-12-15 ENCOUNTER — Other Ambulatory Visit: Payer: Self-pay | Admitting: Emergency Medicine

## 2022-12-24 ENCOUNTER — Encounter: Payer: Self-pay | Admitting: Emergency Medicine

## 2022-12-24 ENCOUNTER — Ambulatory Visit (INDEPENDENT_AMBULATORY_CARE_PROVIDER_SITE_OTHER): Payer: 59 | Admitting: Emergency Medicine

## 2022-12-24 ENCOUNTER — Encounter: Payer: Self-pay | Admitting: *Deleted

## 2022-12-24 VITALS — BP 136/82 | HR 92 | Temp 98.9°F | Ht 66.0 in | Wt 220.1 lb

## 2022-12-24 DIAGNOSIS — G720 Drug-induced myopathy: Secondary | ICD-10-CM

## 2022-12-24 DIAGNOSIS — T466X5A Adverse effect of antihyperlipidemic and antiarteriosclerotic drugs, initial encounter: Secondary | ICD-10-CM

## 2022-12-24 DIAGNOSIS — E1159 Type 2 diabetes mellitus with other circulatory complications: Secondary | ICD-10-CM | POA: Diagnosis not present

## 2022-12-24 DIAGNOSIS — E1169 Type 2 diabetes mellitus with other specified complication: Secondary | ICD-10-CM

## 2022-12-24 DIAGNOSIS — E785 Hyperlipidemia, unspecified: Secondary | ICD-10-CM

## 2022-12-24 DIAGNOSIS — I152 Hypertension secondary to endocrine disorders: Secondary | ICD-10-CM

## 2022-12-24 LAB — POCT GLYCOSYLATED HEMOGLOBIN (HGB A1C): Hemoglobin A1C: 6.9 % — AB (ref 4.0–5.6)

## 2022-12-24 MED ORDER — DAPAGLIFLOZIN PROPANEDIOL 10 MG PO TABS: 10.0000 mg | ORAL_TABLET | Freq: Every day | ORAL | 3 refills | Status: DC

## 2022-12-24 NOTE — Assessment & Plan Note (Signed)
Chronic stable condition Intolerant to statins Diet and nutrition discussed

## 2022-12-24 NOTE — Progress Notes (Signed)
Lindsay Travis 62 y.o.   Chief Complaint  Patient presents with   Medical Management of Chronic Issues    f/u appt, patient states she feel like something is in her left ear, lower back pain     HISTORY OF PRESENT ILLNESS: This is a 62 y.o. female A1A here for 70-month follow-up of chronic medical problems including diabetes Today also complaining of fullness to right ear and occasional low back pain Otherwise doing well. No other complaints or medical concerns today.  HPI   Prior to Admission medications   Medication Sig Start Date End Date Taking? Authorizing Provider  amLODipine (NORVASC) 5 MG tablet Take 1 tablet (5 mg total) by mouth daily. 10/17/22 10/12/23 Yes Lamont Tant, Eilleen Kempf, MD  Ascorbic Acid (VITAMIN C) 1000 MG tablet Take 1,000 mg by mouth 2 (two) times a week.   Yes [provider]  aspirin 81 MG EC tablet Take 1 tablet (81 mg total) by mouth daily. 03/20/18  Yes Ihor Austin, NP  blood glucose meter kit and supplies Per insurance preference. Check daily fasting blood glucose. (Dx. E11.9). 03/20/22  Yes Nala Kachel, Eilleen Kempf, MD  famotidine (PEPCID) 20 MG tablet Take 1 tablet (20 mg total) by mouth 2 (two) times daily. 11/05/22  Yes Roemhildt, Lorin T, PA-C  FARXIGA 10 MG TABS tablet TAKE 1 TABLET(10 MG) BY MOUTH DAILY BEFORE BREAKFAST 12/15/22  Yes Abdo Denault, Eilleen Kempf, MD  glipiZIDE (GLUCOTROL) 5 MG tablet Take 1 tablet (5 mg total) by mouth 2 (two) times daily before a meal. 11/20/22 11/15/23 Yes Carisma Troupe, Eilleen Kempf, MD  Omega-3 Fatty Acids (OMEGA-3 FISH OIL PO) Take 1 capsule by mouth in the morning.   Yes [provider]  traMADol (ULTRAM) 50 MG tablet Take 1 tablet (50 mg total) by mouth every 6 (six) hours as needed (pain). 10/03/22  Yes Zenia Resides, MD    Allergies  Allergen Reactions   Lisinopril Other (See Comments)    Did not feel good   Losartan Other (See Comments)    headache   Aspirin Other (See Comments)    Abdominal pain  only 325 mg     Patient Active Problem List   Diagnosis Date Noted   Musculoskeletal back pain 09/25/2022   Rash and nonspecific skin eruption 09/25/2022   Dyslipidemia associated with type 2 diabetes mellitus 06/19/2022   Statin myopathy 08/01/2021   Obesity, diabetes, and hypertension syndrome 01/09/2021   Morbid obesity 09/18/2019   Dyslipidemia    Type 2 diabetes mellitus with hyperglycemia, without long-term current use of insulin 08/07/2015   Varicose veins of lower extremities with other complications 02/24/2012   Fibroid, uterus 04/18/2011   Hypertension associated with diabetes 04/18/2011    Past Medical History:  Diagnosis Date   Anemia    Arthritis    bilateral kneed and lower legs   Clotting disorder (HCC)    Diabetes mellitus    recent high blood sugar and has RX but not taking  med -b/c she doesn't think she  is diabetic, just had lots of sugar in her diet when dx'd.-on meds   Hx of adenomatous polyp of colon 07/2021   Repeat colonoscopy 2029   Hypertension    on meds   Shortness of breath    on exertion- has low hgb   Sickle cell trait [facility])     Past Surgical History:  Procedure Laterality Date   ABDOMINAL HYSTERECTOMY  09/11/2011   Procedure: HYSTERECTOMY ABDOMINAL;  Surgeon: Kathreen Cosier,  MD;  Location: [facility];  Service: Gynecology;  Laterality: N/A;   COLONOSCOPY WITH PROPOFOL N/A 07/16/2021   Procedure: COLONOSCOPY WITH PROPOFOL;  Surgeon: Iva Boop, MD;  Location: [facility];  Service: Endoscopy;  Laterality: N/A;   POLYPECTOMY  07/16/2021   Procedure: POLYPECTOMY;  Surgeon: Iva Boop, MD;  Location: [facility];  Service: Endoscopy;;    Social History   Socioeconomic History   Marital status: Divorced    Spouse name: Not on file   Number of children: Not on file   Years of education: Not on file   Highest education level: Not on file  Occupational History   Not on file  Tobacco Use   Smoking status: Never   Smokeless  tobacco: Never  Vaping Use   Vaping Use: Never used  Substance and Sexual Activity   Alcohol use: No   Drug use: No   Sexual activity: Never  Other Topics Concern   Not on file  Social History Narrative   Not on file   Social Determinants of Health   Financial Resource Strain: Not on file  Food Insecurity: Not on file  Transportation Needs: Not on file  Physical Activity: Not on file  Stress: Not on file  Social Connections: Not on file  Intimate Partner Violence: Not on file    Family History  Problem Relation Age of Onset   Hypertension Other    Colon polyps Neg Hx    Colon cancer Neg Hx    Heart disease Neg Hx    Esophageal cancer Neg Hx    Rectal cancer Neg Hx    Stomach cancer Neg Hx      Review of Systems  Constitutional: Negative.  Negative for chills and fever.  HENT:  Negative for congestion and sore throat.   Respiratory: Negative.  Negative for cough and shortness of breath.   Cardiovascular: Negative.  Negative for chest pain and palpitations.  Gastrointestinal:  Negative for abdominal pain, nausea and vomiting.  Genitourinary:  Positive for frequency.  Musculoskeletal:  Positive for back pain.  Skin: Negative.  Negative for rash.  Neurological: Negative.  Negative for dizziness and headaches.  All other systems reviewed and are negative.   Vitals:   12/24/22 1407  BP: 136/82  Pulse: 92  Temp: 98.9 F (37.2 C)  SpO2: 95%    Physical Exam Vitals reviewed.  Constitutional:      Appearance: Normal appearance. She is obese.  HENT:     Head: Normocephalic.     Right Ear: Tympanic membrane, ear canal and external ear normal.     Left Ear: Tympanic membrane, ear canal and external ear normal.     Mouth/Throat:     Mouth: Mucous membranes are moist.     Pharynx: Oropharynx is clear.  Eyes:     Extraocular Movements: Extraocular movements intact.     Conjunctiva/sclera: Conjunctivae normal.     Pupils: Pupils are equal, round, and reactive to  light.  Cardiovascular:     Rate and Rhythm: Normal rate and regular rhythm.     Pulses: Normal pulses.     Heart sounds: Normal heart sounds.  Pulmonary:     Effort: Pulmonary effort is normal.     Breath sounds: Normal breath sounds.  Abdominal:     Palpations: Abdomen is soft.     Tenderness: There is no abdominal tenderness.  Musculoskeletal:     Cervical back: No tenderness.  Lymphadenopathy:     Cervical: No  cervical adenopathy.  Skin:    General: Skin is warm and dry.  Neurological:     General: No focal deficit present.     Mental Status: She is alert and oriented to person, place, and time.  Psychiatric:        Mood and Affect: Mood normal.        Behavior: Behavior normal.    Results for orders placed or performed in visit on 12/24/22 (from the past 24 hour(s))  POCT HgB A1C     Status: Abnormal   Collection Time: 12/24/22  2:42 PM  Result Value Ref Range   Hemoglobin A1C 6.9 (A) 4.0 - 5.6 %   HbA1c POC (<> result, manual entry)     HbA1c, POC (prediabetic range)     HbA1c, POC (controlled diabetic range)       ASSESSMENT & PLAN: A total of 43 minutes was spent with the patient and counseling/coordination of care regarding preparing for this visit, review of most recent office visit notes, review of multiple chronic medical conditions and their management, review of all medications, review of most recent blood work results including interpretation of today's hemoglobin A1c, cardiovascular risks associated with hypertension diabetes and dyslipidemia, education on nutrition, prognosis, documentation and need for follow-up.  Problem List Items Addressed This Visit       Cardiovascular and Mediastinum   Hypertension associated with diabetes - Primary    Well-controlled hypertension Continue amlodipine 5 mg daily Well-controlled diabetes with hemoglobin A1c of 6.9 Continue Farxiga 10 mg daily and glipizide 5 mg twice a day Cardiovascular risks associated with  hypertension and diabetes discussed Diet and nutrition discussed Follow-up in 6 months.      Relevant Medications   dapagliflozin propanediol (FARXIGA) 10 MG TABS tablet   Other Relevant Orders   POCT HgB A1C (Completed)     Endocrine   Dyslipidemia associated with type 2 diabetes mellitus    Chronic stable condition Intolerant to statins Diet and nutrition discussed        Relevant Medications   dapagliflozin propanediol (FARXIGA) 10 MG TABS tablet     Musculoskeletal and Integument   Statin myopathy   Patient Instructions  Diabetes Mellitus and Nutrition, Adult When you have diabetes, or diabetes mellitus, it is very important to have healthy eating habits because your blood sugar (glucose) levels are greatly affected by what you eat and drink. Eating healthy foods in the right amounts, at about the same times every day, can help you: Manage your blood glucose. Lower your risk of heart disease. Improve your blood pressure. Reach or maintain a healthy weight. What can affect my meal plan? Every person with diabetes is different, and each person has different needs for a meal plan. Your health care provider may recommend that you work with a dietitian to make a meal plan that is best for you. Your meal plan may vary depending on factors such as: The calories you need. The medicines you take. Your weight. Your blood glucose, blood pressure, and cholesterol levels. Your activity level. Other health conditions you have, such as heart or kidney disease. How do carbohydrates affect me? Carbohydrates, also called carbs, affect your blood glucose level more than any other type of food. Eating carbs raises the amount of glucose in your blood. It is important to know how many carbs you can safely have in each meal. This is different for every person. Your dietitian can help you calculate how many carbs you should have at  each meal and for each snack. How does alcohol affect  me? Alcohol can cause a decrease in blood glucose (hypoglycemia), especially if you use insulin or take certain diabetes medicines by mouth. Hypoglycemia can be a life-threatening condition. Symptoms of hypoglycemia, such as sleepiness, dizziness, and confusion, are similar to symptoms of having too much alcohol. Do not drink alcohol if: Your health care provider tells you not to drink. You are pregnant, may be pregnant, or are planning to become pregnant. If you drink alcohol: Limit how much you have to: 0-1 drink a day for women. 0-2 drinks a day for men. Know how much alcohol is in your drink. In the [place] one drink equals one 12 oz bottle of beer (355 mL), one 5 oz glass of wine (148 mL), or one 1 oz glass of hard liquor (44 mL). Keep yourself hydrated with water, diet soda, or unsweetened iced tea. Keep in mind that regular soda, juice, and other mixers may contain a lot of sugar and must be counted as carbs. What are tips for following this plan?  Reading food labels Start by checking the serving size on the Nutrition Facts label of packaged foods and drinks. The number of calories and the amount of carbs, fats, and other nutrients listed on the label are based on one serving of the item. Many items contain more than one serving per package. Check the total grams (g) of carbs in one serving. Check the number of grams of saturated fats and trans fats in one serving. Choose foods that have a low amount or none of these fats. Check the number of milligrams (mg) of salt (sodium) in one serving. Most people should limit total sodium intake to less than 2,300 mg per day. Always check the nutrition information of foods labeled as "low-fat" or "nonfat." These foods may be higher in added sugar or refined carbs and should be avoided. Talk to your dietitian to identify your daily goals for nutrients listed on the label. Shopping Avoid buying canned, pre-made, or processed foods. These foods tend to  be high in fat, sodium, and added sugar. Shop around the outside edge of the grocery store. This is where you will most often find fresh fruits and vegetables, bulk grains, fresh meats, and fresh dairy products. Cooking Use low-heat cooking methods, such as baking, instead of high-heat cooking methods, such as deep frying. Cook using healthy oils, such as olive, canola, or sunflower oil. Avoid cooking with butter, cream, or high-fat meats. Meal planning Eat meals and snacks regularly, preferably at the same times every day. Avoid going long periods of time without eating. Eat foods that are high in fiber, such as fresh fruits, vegetables, beans, and whole grains. Eat 4-6 oz (112-168 g) of lean protein each day, such as lean meat, chicken, fish, eggs, or tofu. One ounce (oz) (28 g) of lean protein is equal to: 1 oz (28 g) of meat, chicken, or fish. 1 egg.  cup (62 g) of tofu. Eat some foods each day that contain healthy fats, such as avocado, nuts, seeds, and fish. What foods should I eat? Fruits Berries. Apples. Oranges. Peaches. Apricots. Plums. Grapes. Mangoes. Papayas. Pomegranates. Kiwi. Cherries. Vegetables Leafy greens, including lettuce, spinach, kale, chard, collard greens, mustard greens, and cabbage. Beets. Cauliflower. Broccoli. Carrots. Green beans. Tomatoes. Peppers. Onions. Cucumbers. Brussels sprouts. Grains Whole grains, such as whole-wheat or whole-grain bread, crackers, tortillas, cereal, and pasta. Unsweetened oatmeal. Quinoa. Brown or wild rice. Meats and other proteins Seafood.  Poultry without skin. Lean cuts of poultry and beef. Tofu. Nuts. Seeds. Dairy Low-fat or fat-free dairy products such as milk, yogurt, and cheese. The items listed above may not be a complete list of foods and beverages you can eat and drink. Contact a dietitian for more information. What foods should I avoid? Fruits Fruits canned with syrup. Vegetables Canned vegetables. Frozen vegetables  with butter or cream sauce. Grains Refined white flour and flour products such as bread, pasta, snack foods, and cereals. Avoid all processed foods. Meats and other proteins Fatty cuts of meat. Poultry with skin. Breaded or fried meats. Processed meat. Avoid saturated fats. Dairy Full-fat yogurt, cheese, or milk. Beverages Sweetened drinks, such as soda or iced tea. The items listed above may not be a complete list of foods and beverages you should avoid. Contact a dietitian for more information. Questions to ask a health care provider Do I need to meet with a certified diabetes care and education specialist? Do I need to meet with a dietitian? What number can I call if I have questions? When are the best times to check my blood glucose? Where to find more information: American Diabetes Association: diabetes.org Academy of Nutrition and Dietetics: eatright.[employer] of Diabetes and Digestive and Kidney Diseases: StageSync.si Association of Diabetes Care & Education Specialists: diabeteseducator.org Summary It is important to have healthy eating habits because your blood sugar (glucose) levels are greatly affected by what you eat and drink. It is important to use alcohol carefully. A healthy meal plan will help you manage your blood glucose and lower your risk of heart disease. Your health care provider may recommend that you work with a dietitian to make a meal plan that is best for you. This information is not intended to replace advice given to you by your health care provider. Make sure you discuss any questions you have with your health care provider. Document Revised: 03/29/2020 Document Reviewed: 03/29/2020 Elsevier Patient Education  2023 Elsevier Inc.    Edwina Barth, MD [facility]

## 2022-12-24 NOTE — Patient Instructions (Signed)

## 2022-12-24 NOTE — Assessment & Plan Note (Signed)
Well-controlled hypertension Continue amlodipine 5 mg daily Well-controlled diabetes with hemoglobin A1c of 6.9 Continue Farxiga 10 mg daily and glipizide 5 mg twice a day Cardiovascular risks associated with hypertension and diabetes discussed Diet and nutrition discussed Follow-up in 6 months.

## 2023-03-27 ENCOUNTER — Other Ambulatory Visit: Payer: Self-pay | Admitting: *Deleted

## 2023-03-27 MED ORDER — ACCU-CHEK GUIDE VI STRP: ORAL_STRIP | 12 refills | Status: DC

## 2023-04-04 ENCOUNTER — Other Ambulatory Visit: Payer: Self-pay | Admitting: Emergency Medicine

## 2023-04-04 DIAGNOSIS — E1165 Type 2 diabetes mellitus with hyperglycemia: Secondary | ICD-10-CM

## 2023-06-11 ENCOUNTER — Telehealth: Payer: Self-pay | Admitting: Emergency Medicine

## 2023-06-11 ENCOUNTER — Ambulatory Visit: Payer: 59 | Admitting: Family Medicine

## 2023-06-11 NOTE — Telephone Encounter (Signed)
Patient is asking for a referral to sports medicine for her joint pain. Patient is scheduled to be seen tomorrow 06/12/23. She would like a call to discuss at [phone].

## 2023-06-11 NOTE — Telephone Encounter (Signed)
Called patient she has an appt with Sport medicine on 10/3

## 2023-06-11 NOTE — Progress Notes (Deleted)
   Rubin Payor, PhD, LAT, ATC acting as a scribe for Clementeen Graham, MD.  Lindsay Travis is a 62 y.o. female who presents to [facility] at [facility] today for polyarthralgia?     Pertinent review of systems: ***  Relevant historical information: ***   Exam:  LMP 09/10/2011  General: Well Developed, well nourished, and in no acute distress.   MSK: ***    Lab and Radiology Results No results found for this or any previous visit (from the past 72 hour(s)). No results found.     Assessment and Plan: 62 y.o. female with ***   PDMP not reviewed this encounter. No orders of the defined types were placed in this encounter.  No orders of the defined types were placed in this encounter.    Discussed warning signs or symptoms. Please see discharge instructions. Patient expresses understanding.   ***

## 2023-06-12 ENCOUNTER — Ambulatory Visit: Payer: 59 | Admitting: Emergency Medicine

## 2023-06-16 ENCOUNTER — Ambulatory Visit (INDEPENDENT_AMBULATORY_CARE_PROVIDER_SITE_OTHER): Payer: 59 | Admitting: Family Medicine

## 2023-06-16 ENCOUNTER — Ambulatory Visit (INDEPENDENT_AMBULATORY_CARE_PROVIDER_SITE_OTHER): Payer: 59

## 2023-06-16 VITALS — BP 132/88 | HR 71 | Ht 66.0 in | Wt 235.0 lb

## 2023-06-16 DIAGNOSIS — M7062 Trochanteric bursitis, left hip: Secondary | ICD-10-CM

## 2023-06-16 DIAGNOSIS — M7061 Trochanteric bursitis, right hip: Secondary | ICD-10-CM

## 2023-06-16 DIAGNOSIS — M545 Low back pain, unspecified: Secondary | ICD-10-CM

## 2023-06-16 DIAGNOSIS — G8929 Other chronic pain: Secondary | ICD-10-CM

## 2023-06-16 NOTE — Progress Notes (Signed)
I, Philbert Riser, PhD, LAT, ATC acting as a scribe for Terrilee Files, DO.I, Philbert Riser, PhD, LAT, ATC acting as a scribe for Terrilee Files, DO. I, Philbert Riser, PhD, LAT, ATC acting as a scribe for Clementeen Graham, MD.  Lindsay Travis is a 62 y.o. female who presents to [facility] at [facility] today for polyarthralgia.Pt locates pain to bilat hip, low back, bilat buttocks, w/ radiating pain along the posterior aspect of both of her legs to her posterior thigh.  Majority of pain is located in the posterior to lateral hips and buttocks bilaterally.  Pain is worse with activity.  LE numbness/tingling: no- but when flared feels "cramping" Aggravates: prolonged standing Treatments tried: OTC leg cramping pills, IBU, ointment  Pertinent review of systems: No fevers or chills  Relevant historical information: Hypertension and diabetes and obesity. History of statin myopathy not currently on statin.   Exam:  BP 132/88   Pulse 71   Ht 5\' 6"  (1.676 m)   Wt 235 lb (106.6 kg)   LMP 09/10/2011   SpO2 95%   BMI 37.93 kg/m  General: Well Developed, well nourished, and in no acute distress.   MSK: L-spine: Normal appearing Nontender to palpation spinal midline. Normal lumbar motion. Lower extremity strength is intact except noted below.  Hips bilaterally normal-appearing Normal hip motion. Tender palpation greater trochanter. Reduced hip abduction and external rotation strength 4/5 both with pain.    Lab and Radiology Results  Hip greater trochanteric injection: Right Consent obtained and timeout performed. Area of maximum tenderness palpated and identified. Skin cleaned with alcohol, cold spray applied. A 22-gauge needle was used to access the greater trochanteric bursa. 40mg  of Kenalog and 2 mL of Marcaine were used to inject the trochanteric bursa. Patient tolerated the procedure well.  Hip greater trochanteric injection: Left Consent obtained and timeout  performed. Area of maximum tenderness palpated and identified. Skin cleaned with alcohol, cold spray applied. A 22-gauge needle was used to access the greater trochanteric bursa. 40mg  of Kenalog and 2 mL of Marcaine were used to inject the trochanteric bursa. Patient tolerated the procedure well.  X-ray images lumbar spine obtained today personally and independently interpreted Mild multilevel DDD.  Facet DJD L5-S1. Await formal radiology review  EXAM: DG HIP (WITH OR WITHOUT PELVIS) 5+V BILAT   COMPARISON:  None.   FINDINGS: Frontal view of the pelvis as well as frontal and frogleg lateral views of both hips are obtained. No acute fracture, subluxation, or dislocation within either hip. Joint spaces are relatively well preserved. Sacroiliac joints are unremarkable. Prominent spondylosis at the lumbosacral junction.   IMPRESSION: 1. Unremarkable pelvis and bilateral hips. 2. Lower lumbar degenerative changes.     Electronically Signed   By: Sharlet Salina M.D.   On: 11/07/2021 15:58 I, Clementeen Graham, personally (independently) visualized and performed the interpretation of the images attached in this note.  Assessment and Plan: 62 y.o. female with chronic bilateral lateral hip pain associated with low back pain.  I think the majority of her pain is probably hip abductor tendinopathy and trochanteric bursitis.  Plan for bilateral greater trochanter injections and referral to physical therapy.  PT should help the hips and the low back.  Check back in about 6 weeks.  If not improving consider advanced imaging.   PDMP not reviewed this encounter. Orders Placed This Encounter  Procedures   DG Lumbar Spine 2-3 Views    Standing Status:   Future    Number of  Occurrences:   1    Standing Expiration Date:   06/15/2024    Order Specific Question:   Reason for Exam (SYMPTOM  OR DIAGNOSIS REQUIRED)    Answer:   eval lumbar pain    Order Specific Question:   Preferred imaging location?     Answer:   [facility]   Ambulatory referral to Physical Therapy    Referral Priority:   Routine    Referral Type:   Physical Medicine    Referral Reason:   Specialty Services Required    Requested Specialty:   Physical Therapy   No orders of the defined types were placed in this encounter.    Discussed warning signs or symptoms. Please see discharge instructions. Patient expresses understanding.   The above documentation has been reviewed and is accurate and complete Clementeen Graham, M.D.

## 2023-06-16 NOTE — Patient Instructions (Addendum)
Thank you for coming in today.   I've referred you to Physical Therapy.  Let us know if you don't hear from them in one week.   You received an injection today. Seek immediate medical attention if the joint becomes red, extremely painful, or is oozing fluid.   Please get an Xray today before you leave  Check back in 6 weeks

## 2023-06-17 ENCOUNTER — Ambulatory Visit: Payer: 59 | Admitting: Emergency Medicine

## 2023-06-25 ENCOUNTER — Ambulatory Visit: Payer: 59 | Admitting: Emergency Medicine

## 2023-06-25 ENCOUNTER — Encounter: Payer: Self-pay | Admitting: Emergency Medicine

## 2023-06-25 VITALS — BP 134/80 | HR 81 | Temp 98.4°F | Ht 66.0 in | Wt 232.0 lb

## 2023-06-25 DIAGNOSIS — G720 Drug-induced myopathy: Secondary | ICD-10-CM

## 2023-06-25 DIAGNOSIS — T466X5A Adverse effect of antihyperlipidemic and antiarteriosclerotic drugs, initial encounter: Secondary | ICD-10-CM

## 2023-06-25 DIAGNOSIS — Z7984 Long term (current) use of oral hypoglycemic drugs: Secondary | ICD-10-CM | POA: Diagnosis not present

## 2023-06-25 DIAGNOSIS — E1169 Type 2 diabetes mellitus with other specified complication: Secondary | ICD-10-CM

## 2023-06-25 DIAGNOSIS — I152 Hypertension secondary to endocrine disorders: Secondary | ICD-10-CM | POA: Diagnosis not present

## 2023-06-25 DIAGNOSIS — E1159 Type 2 diabetes mellitus with other circulatory complications: Secondary | ICD-10-CM

## 2023-06-25 DIAGNOSIS — Z1231 Encounter for screening mammogram for malignant neoplasm of breast: Secondary | ICD-10-CM

## 2023-06-25 DIAGNOSIS — Z23 Encounter for immunization: Secondary | ICD-10-CM

## 2023-06-25 DIAGNOSIS — N3281 Overactive bladder: Secondary | ICD-10-CM

## 2023-06-25 DIAGNOSIS — E785 Hyperlipidemia, unspecified: Secondary | ICD-10-CM

## 2023-06-25 LAB — POCT GLYCOSYLATED HEMOGLOBIN (HGB A1C): Hemoglobin A1C: 8.1 % — AB (ref 4.0–5.6)

## 2023-06-25 MED ORDER — OXYBUTYNIN CHLORIDE 5 MG PO TABS: 5.0000 mg | ORAL_TABLET | Freq: Three times a day (TID) | ORAL | 3 refills | Status: AC | PRN

## 2023-06-25 NOTE — Patient Instructions (Signed)
Continue Farxiga 10 mg daily Take glipizide 5 mg twice a day Exercise more frequently and pay more attention to your nutrition Follow-up in 3 months  Diabetes Mellitus and Nutrition, Adult When you have diabetes, or diabetes mellitus, it is very important to have healthy eating habits because your blood sugar (glucose) levels are greatly affected by what you eat and drink. Eating healthy foods in the right amounts, at about the same times every day, can help you: Manage your blood glucose. Lower your risk of heart disease. Improve your blood pressure. Reach or maintain a healthy weight. What can affect my meal plan? Every person with diabetes is different, and each person has different needs for a meal plan. Your health care provider may recommend that you work with a dietitian to make a meal plan that is best for you. Your meal plan may vary depending on factors such as: The calories you need. The medicines you take. Your weight. Your blood glucose, blood pressure, and cholesterol levels. Your activity level. Other health conditions you have, such as heart or kidney disease. How do carbohydrates affect me? Carbohydrates, also called carbs, affect your blood glucose level more than any other type of food. Eating carbs raises the amount of glucose in your blood. It is important to know how many carbs you can safely have in each meal. This is different for every person. Your dietitian can help you calculate how many carbs you should have at each meal and for each snack. How does alcohol affect me? Alcohol can cause a decrease in blood glucose (hypoglycemia), especially if you use insulin or take certain diabetes medicines by mouth. Hypoglycemia can be a life-threatening condition. Symptoms of hypoglycemia, such as sleepiness, dizziness, and confusion, are similar to symptoms of having too much alcohol. Do not drink alcohol if: Your health care provider tells you not to drink. You are  pregnant, may be pregnant, or are planning to become pregnant. If you drink alcohol: Limit how much you have to: 0-1 drink a day for women. 0-2 drinks a day for men. Know how much alcohol is in your drink. In the [place] one drink equals one 12 oz bottle of beer (355 mL), one 5 oz glass of wine (148 mL), or one 1 oz glass of hard liquor (44 mL). Keep yourself hydrated with water, diet soda, or unsweetened iced tea. Keep in mind that regular soda, juice, and other mixers may contain a lot of sugar and must be counted as carbs. What are tips for following this plan?  Reading food labels Start by checking the serving size on the Nutrition Facts label of packaged foods and drinks. The number of calories and the amount of carbs, fats, and other nutrients listed on the label are based on one serving of the item. Many items contain more than one serving per package. Check the total grams (g) of carbs in one serving. Check the number of grams of saturated fats and trans fats in one serving. Choose foods that have a low amount or none of these fats. Check the number of milligrams (mg) of salt (sodium) in one serving. Most people should limit total sodium intake to less than 2,300 mg per day. Always check the nutrition information of foods labeled as "low-fat" or "nonfat." These foods may be higher in added sugar or refined carbs and should be avoided. Talk to your dietitian to identify your daily goals for nutrients listed on the label. Shopping Avoid buying canned, pre-made, or  processed foods. These foods tend to be high in fat, sodium, and added sugar. Shop around the outside edge of the grocery store. This is where you will most often find fresh fruits and vegetables, bulk grains, fresh meats, and fresh dairy products. Cooking Use low-heat cooking methods, such as baking, instead of high-heat cooking methods, such as deep frying. Cook using healthy oils, such as olive, canola, or sunflower oil. Avoid  cooking with butter, cream, or high-fat meats. Meal planning Eat meals and snacks regularly, preferably at the same times every day. Avoid going long periods of time without eating. Eat foods that are high in fiber, such as fresh fruits, vegetables, beans, and whole grains. Eat 4-6 oz (112-168 g) of lean protein each day, such as lean meat, chicken, fish, eggs, or tofu. One ounce (oz) (28 g) of lean protein is equal to: 1 oz (28 g) of meat, chicken, or fish. 1 egg.  cup (62 g) of tofu. Eat some foods each day that contain healthy fats, such as avocado, nuts, seeds, and fish. What foods should I eat? Fruits Berries. Apples. Oranges. Peaches. Apricots. Plums. Grapes. Mangoes. Papayas. Pomegranates. Kiwi. Cherries. Vegetables Leafy greens, including lettuce, spinach, kale, chard, collard greens, mustard greens, and cabbage. Beets. Cauliflower. Broccoli. Carrots. Green beans. Tomatoes. Peppers. Onions. Cucumbers. Brussels sprouts. Grains Whole grains, such as whole-wheat or whole-grain bread, crackers, tortillas, cereal, and pasta. Unsweetened oatmeal. Quinoa. Brown or wild rice. Meats and other proteins Seafood. Poultry without skin. Lean cuts of poultry and beef. Tofu. Nuts. Seeds. Dairy Low-fat or fat-free dairy products such as milk, yogurt, and cheese. The items listed above may not be a complete list of foods and beverages you can eat and drink. Contact a dietitian for more information. What foods should I avoid? Fruits Fruits canned with syrup. Vegetables Canned vegetables. Frozen vegetables with butter or cream sauce. Grains Refined white flour and flour products such as bread, pasta, snack foods, and cereals. Avoid all processed foods. Meats and other proteins Fatty cuts of meat. Poultry with skin. Breaded or fried meats. Processed meat. Avoid saturated fats. Dairy Full-fat yogurt, cheese, or milk. Beverages Sweetened drinks, such as soda or iced tea. The items listed above  may not be a complete list of foods and beverages you should avoid. Contact a dietitian for more information. Questions to ask a health care provider Do I need to meet with a certified diabetes care and education specialist? Do I need to meet with a dietitian? What number can I call if I have questions? When are the best times to check my blood glucose? Where to find more information: American Diabetes Association: diabetes.org Academy of Nutrition and Dietetics: eatright.[employer] of Diabetes and Digestive and Kidney Diseases: [email or web address].si Association of Diabetes Care & Education Specialists: diabeteseducator.org Summary It is important to have healthy eating habits because your blood sugar (glucose) levels are greatly affected by what you eat and drink. It is important to use alcohol carefully. A healthy meal plan will help you manage your blood glucose and lower your risk of heart disease. Your health care provider may recommend that you work with a dietitian to make a meal plan that is best for you. This information is not intended to replace advice given to you by your health care provider. Make sure you discuss any questions you have with your health care provider. Document Revised: 03/29/2020 Document Reviewed: 03/29/2020 Elsevier Patient Education  2024 [employer].

## 2023-06-25 NOTE — Assessment & Plan Note (Addendum)
Active and affecting quality of life. Recommend to start Ditropan 5 mg every 8 hours as needed

## 2023-06-25 NOTE — Assessment & Plan Note (Signed)
Diet and nutrition discussed benefit Benefits of exercise discussed Advised to decrease amount of daily carbohydrate intake and daily calories and increase amount of plant-based protein in her diet Follow-up in 3 months

## 2023-06-25 NOTE — Progress Notes (Signed)
Burnis Medin 62 y.o.   Chief Complaint  Patient presents with   Medical Management of Chronic Issues    f/u appt, joint pain     HISTORY OF PRESENT ILLNESS: This is a 62 y.o. female here for 56-month follow-up of chronic medical conditions including diabetes and hypertension Overall feeling fine.  Occasional joint pains. Complaining of urinary frequency and urgency during the day.  At times feels like she will not be able to hold it in and has had occasional accidents. No other complaints or medical concerns today. Lab Results  Component Value Date   HGBA1C 6.9 (A) 12/24/2022   BP Readings from Last 3 Encounters:  06/16/23 132/88  12/24/22 136/82  11/05/22 (!) 137/104   Wt Readings from Last 3 Encounters:  06/25/23 232 lb (105.2 kg)  06/16/23 235 lb (106.6 kg)  12/24/22 220 lb 2 oz (99.8 kg)     HPI   Prior to Admission medications   Medication Sig Start Date End Date Taking? Authorizing Provider  amLODipine (NORVASC) 5 MG tablet Take 1 tablet (5 mg total) by mouth daily. 10/17/22 10/12/23 Yes Anaria Kroner, Eilleen Kempf, MD  Ascorbic Acid (VITAMIN C) 1000 MG tablet Take 1,000 mg by mouth 2 (two) times a week.   Yes [provider]  aspirin 81 MG EC tablet Take 1 tablet (81 mg total) by mouth daily. 03/20/18  Yes Ihor Austin, NP  blood glucose meter kit and supplies Per insurance preference. Check daily fasting blood glucose. (Dx. E11.9). 03/20/22  Yes Takyia Sindt, Eilleen Kempf, MD  dapagliflozin propanediol (FARXIGA) 10 MG TABS tablet Take 1 tablet (10 mg total) by mouth daily. 12/24/22 12/19/23 Yes Jshawn Hurta, Eilleen Kempf, MD  famotidine (PEPCID) 20 MG tablet Take 1 tablet (20 mg total) by mouth 2 (two) times daily. 11/05/22  Yes Roemhildt, Lorin T, PA-C  glipiZIDE (GLUCOTROL) 5 MG tablet TAKE 1 TABLET (5 MG TOTAL) BY MOUTH TWICE A DAY BEFORE MEALS 04/04/23  Yes Shamica Moree, Eilleen Kempf, MD  glucose blood (ACCU-CHEK GUIDE) test strip Use as instructed 03/27/23  Yes Cherylyn Sundby,  Eilleen Kempf, MD  Melatonin 10 MG CAPS Take by mouth.   Yes [provider]  Omega-3 Fatty Acids (OMEGA-3 FISH OIL PO) Take 1 capsule by mouth in the morning.   Yes [provider]  traMADol (ULTRAM) 50 MG tablet Take 1 tablet (50 mg total) by mouth every 6 (six) hours as needed (pain). Patient not taking: Reported on 06/25/2023 10/03/22   Zenia Resides, MD    Allergies  Allergen Reactions   Lisinopril Other (See Comments)    Did not feel good   Losartan Other (See Comments)    headache   Aspirin Other (See Comments)    Abdominal pain only 325 mg     Patient Active Problem List   Diagnosis Date Noted   Dyslipidemia associated with type 2 diabetes mellitus (HCC) 06/19/2022   Statin myopathy 08/01/2021   Obesity, diabetes, and hypertension syndrome (HCC) 01/09/2021   Morbid obesity (HCC) 09/18/2019   Dyslipidemia    Type 2 diabetes mellitus with hyperglycemia, without long-term current use of insulin (HCC) 08/07/2015   Varicose veins of bilateral lower extremities with other complications 02/24/2012   Uterine leiomyoma 04/18/2011   Hypertension associated with diabetes (HCC) 04/18/2011    Past Medical History:  Diagnosis Date   Anemia    Arthritis    bilateral kneed and lower legs   Clotting disorder (HCC)    Diabetes mellitus    recent  high blood sugar and has RX but not taking  med -b/c she doesn't think she  is diabetic, just had lots of sugar in her diet when dx'd.-on meds   Hx of adenomatous polyp of colon 07/2021   Repeat colonoscopy 2029   Hypertension    on meds   Shortness of breath    on exertion- has low hgb   Sickle cell trait [facility])     Past Surgical History:  Procedure Laterality Date   ABDOMINAL HYSTERECTOMY  09/11/2011   Procedure: HYSTERECTOMY ABDOMINAL;  Surgeon: Kathreen Cosier, MD;  Location: [facility];  Service: Gynecology;  Laterality: N/A;   COLONOSCOPY WITH PROPOFOL N/A 07/16/2021   Procedure: COLONOSCOPY WITH PROPOFOL;   Surgeon: Iva Boop, MD;  Location: [facility];  Service: Endoscopy;  Laterality: N/A;   POLYPECTOMY  07/16/2021   Procedure: POLYPECTOMY;  Surgeon: Iva Boop, MD;  Location: [facility];  Service: Endoscopy;;    Social History   Socioeconomic History   Marital status: Divorced    Spouse name: Not on file   Number of children: Not on file   Years of education: Not on file   Highest education level: Not on file  Occupational History   Not on file  Tobacco Use   Smoking status: Never   Smokeless tobacco: Never  Vaping Use   Vaping status: Never Used  Substance and Sexual Activity   Alcohol use: No   Drug use: No   Sexual activity: Never  Other Topics Concern   Not on file  Social History Narrative   Not on file   Social Determinants of Health   Financial Resource Strain: Not on file  Food Insecurity: Not on file  Transportation Needs: Not on file  Physical Activity: Not on file  Stress: Not on file  Social Connections: Not on file  Intimate Partner Violence: Not on file    Family History  Problem Relation Age of Onset   Hypertension Other    Colon polyps Neg Hx    Colon cancer Neg Hx    Heart disease Neg Hx    Esophageal cancer Neg Hx    Rectal cancer Neg Hx    Stomach cancer Neg Hx      Review of Systems  Constitutional: Negative.  Negative for chills and fever.  HENT: Negative.  Negative for congestion and sore throat.   Respiratory: Negative.  Negative for cough and shortness of breath.   Cardiovascular: Negative.  Negative for chest pain and palpitations.  Gastrointestinal:  Negative for abdominal pain, diarrhea, nausea and vomiting.  Genitourinary:  Positive for frequency and urgency. Negative for dysuria and hematuria.  Musculoskeletal:  Positive for joint pain.  Skin: Negative.  Negative for rash.  Neurological:  Negative for dizziness and headaches.  All other systems reviewed and are negative.   Today's Vitals   06/25/23 1033  06/25/23 1049  BP: (!) 148/84 134/80  Pulse: 81   Temp: 98.4 F (36.9 C)   TempSrc: Oral   SpO2: 96%   Weight: 232 lb (105.2 kg)   Height: 5\' 6"  (1.676 m)    Body mass index is 37.45 kg/m.   Physical Exam Vitals reviewed.  Constitutional:      Appearance: Normal appearance.  HENT:     Head: Normocephalic.     Mouth/Throat:     Mouth: Mucous membranes are moist.     Pharynx: Oropharynx is clear.  Eyes:     Extraocular Movements: Extraocular movements intact.  Pupils: Pupils are equal, round, and reactive to light.  Cardiovascular:     Rate and Rhythm: Normal rate and regular rhythm.     Pulses: Normal pulses.     Heart sounds: Normal heart sounds.  Pulmonary:     Effort: Pulmonary effort is normal.     Breath sounds: Normal breath sounds.  Skin:    General: Skin is warm and dry.     Capillary Refill: Capillary refill takes less than 2 seconds.  Neurological:     Mental Status: She is alert and oriented to person, place, and time.  Psychiatric:        Mood and Affect: Mood normal.        Behavior: Behavior normal.    Results for orders placed or performed in visit on 06/25/23 (from the past 24 hour(s))  POCT HgB A1C     Status: Abnormal   Collection Time: 06/25/23 11:25 AM  Result Value Ref Range   Hemoglobin A1C 8.1 (A) 4.0 - 5.6 %   HbA1c POC (<> result, manual entry)     HbA1c, POC (prediabetic range)     HbA1c, POC (controlled diabetic range)       ASSESSMENT & PLAN: A total of 44 minutes was spent with the patient and counseling/coordination of care regarding preparing for this visit, review of most recent office visit notes, review of multiple chronic medical conditions, review of most recent blood work results including interpretation of today's hemoglobin A1c, cardiovascular risks associated with uncontrolled diabetes, education on nutrition, review of all medications, prognosis, treatment of overactive bladder, documentation and need for  follow-up.  Problem List Items Addressed This Visit       Cardiovascular and Mediastinum   Hypertension associated with diabetes (HCC) - Primary    Elevated blood pressure reading in the office but normal readings at home Continue amlodipine 5 mg daily Elevated hemoglobin A1c at 8.1. Does not want to start new medications.  Has been taking glipizide only once a day.  Recommend to start glipizide 5 mg twice a day and continue Farxiga 10 mg daily Cardiovascular risks associated with uncontrolled diabetes and hypertension discussed Diet and nutrition discussed Follow-up in 3 months.      Relevant Orders   POCT HgB A1C (Completed)     Endocrine   Dyslipidemia associated with type 2 diabetes mellitus (HCC)    Uncontrolled diabetes with hemoglobin A1c at 8.1 Does not want to start new medication Continue glipizide 5 mg twice a day and Farxiga 10 mg daily Intolerant to statins        Musculoskeletal and Integument   Statin myopathy     Genitourinary   Overactive bladder    Active and affecting quality of life. Recommend to start Ditropan 5 mg every 8 hours as needed      Relevant Medications   oxybutynin (DITROPAN) 5 MG tablet     Other   Morbid obesity (HCC)    Diet and nutrition discussed benefit Benefits of exercise discussed Advised to decrease amount of daily carbohydrate intake and daily calories and increase amount of plant-based protein in her diet Follow-up in 3 months      Other Visit Diagnoses     Need for vaccination       Relevant Orders   Flu vaccine trivalent PF, 6mos and older(Flulaval,Afluria,Fluarix,Fluzone) (Completed)   Screening mammogram for breast cancer       Relevant Orders   MM Digital Screening      Patient Instructions  Continue Farxiga 10 mg daily Take glipizide 5 mg twice a day Exercise more frequently and pay more attention to your nutrition Follow-up in 3 months  Diabetes Mellitus and Nutrition, Adult When you have diabetes,  or diabetes mellitus, it is very important to have healthy eating habits because your blood sugar (glucose) levels are greatly affected by what you eat and drink. Eating healthy foods in the right amounts, at about the same times every day, can help you: Manage your blood glucose. Lower your risk of heart disease. Improve your blood pressure. Reach or maintain a healthy weight. What can affect my meal plan? Every person with diabetes is different, and each person has different needs for a meal plan. Your health care provider may recommend that you work with a dietitian to make a meal plan that is best for you. Your meal plan may vary depending on factors such as: The calories you need. The medicines you take. Your weight. Your blood glucose, blood pressure, and cholesterol levels. Your activity level. Other health conditions you have, such as heart or kidney disease. How do carbohydrates affect me? Carbohydrates, also called carbs, affect your blood glucose level more than any other type of food. Eating carbs raises the amount of glucose in your blood. It is important to know how many carbs you can safely have in each meal. This is different for every person. Your dietitian can help you calculate how many carbs you should have at each meal and for each snack. How does alcohol affect me? Alcohol can cause a decrease in blood glucose (hypoglycemia), especially if you use insulin or take certain diabetes medicines by mouth. Hypoglycemia can be a life-threatening condition. Symptoms of hypoglycemia, such as sleepiness, dizziness, and confusion, are similar to symptoms of having too much alcohol. Do not drink alcohol if: Your health care provider tells you not to drink. You are pregnant, may be pregnant, or are planning to become pregnant. If you drink alcohol: Limit how much you have to: 0-1 drink a day for women. 0-2 drinks a day for men. Know how much alcohol is in your drink. In the [place] one  drink equals one 12 oz bottle of beer (355 mL), one 5 oz glass of wine (148 mL), or one 1 oz glass of hard liquor (44 mL). Keep yourself hydrated with water, diet soda, or unsweetened iced tea. Keep in mind that regular soda, juice, and other mixers may contain a lot of sugar and must be counted as carbs. What are tips for following this plan?  Reading food labels Start by checking the serving size on the Nutrition Facts label of packaged foods and drinks. The number of calories and the amount of carbs, fats, and other nutrients listed on the label are based on one serving of the item. Many items contain more than one serving per package. Check the total grams (g) of carbs in one serving. Check the number of grams of saturated fats and trans fats in one serving. Choose foods that have a low amount or none of these fats. Check the number of milligrams (mg) of salt (sodium) in one serving. Most people should limit total sodium intake to less than 2,300 mg per day. Always check the nutrition information of foods labeled as "low-fat" or "nonfat." These foods may be higher in added sugar or refined carbs and should be avoided. Talk to your dietitian to identify your daily goals for nutrients listed on the label. Shopping Avoid buying canned, pre-made,  or processed foods. These foods tend to be high in fat, sodium, and added sugar. Shop around the outside edge of the grocery store. This is where you will most often find fresh fruits and vegetables, bulk grains, fresh meats, and fresh dairy products. Cooking Use low-heat cooking methods, such as baking, instead of high-heat cooking methods, such as deep frying. Cook using healthy oils, such as olive, canola, or sunflower oil. Avoid cooking with butter, cream, or high-fat meats. Meal planning Eat meals and snacks regularly, preferably at the same times every day. Avoid going long periods of time without eating. Eat foods that are high in fiber, such as  fresh fruits, vegetables, beans, and whole grains. Eat 4-6 oz (112-168 g) of lean protein each day, such as lean meat, chicken, fish, eggs, or tofu. One ounce (oz) (28 g) of lean protein is equal to: 1 oz (28 g) of meat, chicken, or fish. 1 egg.  cup (62 g) of tofu. Eat some foods each day that contain healthy fats, such as avocado, nuts, seeds, and fish. What foods should I eat? Fruits Berries. Apples. Oranges. Peaches. Apricots. Plums. Grapes. Mangoes. Papayas. Pomegranates. Kiwi. Cherries. Vegetables Leafy greens, including lettuce, spinach, kale, chard, collard greens, mustard greens, and cabbage. Beets. Cauliflower. Broccoli. Carrots. Green beans. Tomatoes. Peppers. Onions. Cucumbers. Brussels sprouts. Grains Whole grains, such as whole-wheat or whole-grain bread, crackers, tortillas, cereal, and pasta. Unsweetened oatmeal. Quinoa. Brown or wild rice. Meats and other proteins Seafood. Poultry without skin. Lean cuts of poultry and beef. Tofu. Nuts. Seeds. Dairy Low-fat or fat-free dairy products such as milk, yogurt, and cheese. The items listed above may not be a complete list of foods and beverages you can eat and drink. Contact a dietitian for more information. What foods should I avoid? Fruits Fruits canned with syrup. Vegetables Canned vegetables. Frozen vegetables with butter or cream sauce. Grains Refined white flour and flour products such as bread, pasta, snack foods, and cereals. Avoid all processed foods. Meats and other proteins Fatty cuts of meat. Poultry with skin. Breaded or fried meats. Processed meat. Avoid saturated fats. Dairy Full-fat yogurt, cheese, or milk. Beverages Sweetened drinks, such as soda or iced tea. The items listed above may not be a complete list of foods and beverages you should avoid. Contact a dietitian for more information. Questions to ask a health care provider Do I need to meet with a certified diabetes care and education  specialist? Do I need to meet with a dietitian? What number can I call if I have questions? When are the best times to check my blood glucose? Where to find more information: American Diabetes Association: diabetes.org Academy of Nutrition and Dietetics: eatright[employer] of Diabetes and Digestive and Kidney Diseases: [email or web address].si Association of Diabetes Care & Education Specialists: diabeteseducator.org Summary It is important to have healthy eating habits because your blood sugar (glucose) levels are greatly affected by what you eat and drink. It is important to use alcohol carefully. A healthy meal plan will help you manage your blood glucose and lower your risk of heart disease. Your health care provider may recommend that you work with a dietitian to make a meal plan that is best for you. This information is not intended to replace advice given to you by your health care provider. Make sure you discuss any questions you have with your health care provider. Document Revised: 03/29/2020 Document Reviewed: 03/29/2020 Elsevier Patient Education  2024 [employer] Inc.    Edwina Barth, MD [facility]

## 2023-06-25 NOTE — Assessment & Plan Note (Signed)
Uncontrolled diabetes with hemoglobin A1c at 8.1 Does not want to start new medication Continue glipizide 5 mg twice a day and Farxiga 10 mg daily Intolerant to statins

## 2023-06-25 NOTE — Assessment & Plan Note (Signed)
Elevated blood pressure reading in the office but normal readings at home Continue amlodipine 5 mg daily Elevated hemoglobin A1c at 8.1. Does not want to start new medications.  Has been taking glipizide only once a day.  Recommend to start glipizide 5 mg twice a day and continue Farxiga 10 mg daily Cardiovascular risks associated with uncontrolled diabetes and hypertension discussed Diet and nutrition discussed Follow-up in 3 months.

## 2023-07-07 NOTE — Therapy (Unsigned)
OUTPATIENT PHYSICAL THERAPY LOWER EXTREMITY EVALUATION   Patient Name: Lindsay Travis MRN: [id number] DOB:[birth year], 62 y.o., female Today's Date: 07/08/2023  END OF SESSION:  PT End of Session - 07/08/23 0940     Visit Number 1    Number of Visits 12    Date for PT Re-Evaluation 08/19/23    Authorization Type [employer] after 5 ?)    PT Start Time 0934    PT Stop Time 1012    PT Time Calculation (min) 38 min    Activity Tolerance Patient tolerated treatment well    Behavior During Therapy WFL for tasks assessed/performed             Past Medical History:  Diagnosis Date   Anemia    Arthritis    bilateral kneed and lower legs   Clotting disorder (HCC)    Diabetes mellitus    recent high blood sugar and has RX but not taking  med -b/c she doesn't think she  is diabetic, just had lots of sugar in her diet when dx'd.-on meds   Hx of adenomatous polyp of colon 07/2021   Repeat colonoscopy 2029   Hypertension    on meds   Shortness of breath    on exertion- has low hgb   Sickle cell trait [facility])    Past Surgical History:  Procedure Laterality Date   ABDOMINAL HYSTERECTOMY  09/11/2011   Procedure: HYSTERECTOMY ABDOMINAL;  Surgeon: Kathreen Cosier, MD;  Location: [facility];  Service: Gynecology;  Laterality: N/A;   COLONOSCOPY WITH PROPOFOL N/A 07/16/2021   Procedure: COLONOSCOPY WITH PROPOFOL;  Surgeon: Iva Boop, MD;  Location: [facility];  Service: Endoscopy;  Laterality: N/A;   POLYPECTOMY  07/16/2021   Procedure: POLYPECTOMY;  Surgeon: Iva Boop, MD;  Location: [facility];  Service: Endoscopy;;   Patient Active Problem List   Diagnosis Date Noted   Overactive bladder 06/25/2023   Dyslipidemia associated with type 2 diabetes mellitus (HCC) 06/19/2022   Statin myopathy 08/01/2021   Obesity, diabetes, and hypertension syndrome (HCC) 01/09/2021   Morbid obesity (HCC) 09/18/2019   Dyslipidemia    Type 2 diabetes mellitus with hyperglycemia, without  long-term current use of insulin (HCC) 08/07/2015   Varicose veins of bilateral lower extremities with other complications 02/24/2012   Uterine leiomyoma 04/18/2011   Hypertension associated with diabetes (HCC) 04/18/2011    PCP: Georgina Quint, MD   REFERRING PROVIDER: Rodolph Bong REFERRING DIAG: , MDM70.61,M70.62 (ICD-10-CM) - Trochanteric bursitis of both hips M54.50,G89.29 (ICD-10-CM) - Chronic bilateral low back pain without sciatica  THERAPY DIAG:  Pain of both hip joints  Cramp and spasm  Rationale for Evaluation and Treatment: Rehabilitation  ONSET DATE: acute on chronic   SUBJECTIVE:   SUBJECTIVE STATEMENT: Pt presents with chronic hip pain, back pain. Pt locates pain to bilat hip, low back, bilat buttocks, w/ radiating pain along the posterior aspect of both of her legs to her posterior thigh.  She has a new onset of leg cramping as well. Cramping mostly at night and afternoon.   She reports her legs are weak when standing > 10 min. She has recently added Potassium and has had an injections which seems to be helping.    PERTINENT HISTORY: Pt reports a stroke about 5 yrs ago with LE weakness  Diabetes HTN  PAIN:  Are you having pain? Yes: NPRS scale: 3/10 Pain location: hips  Pain description: aching  Aggravating factors: standing, walking  Relieving factors: Injection,  stop walking , rest  Cramping can occur whenever it wants  Can be 10/10 with cramping   PRECAUTIONS: None   RED FLAGS: None   WEIGHT BEARING RESTRICTIONS: No  FALLS:  Has patient fallen in last 6 months? No "My balance is not good"   LIVING ENVIRONMENT: Lives with: lives with their daughter and son  Lives in: House/apartment Stairs: Yes: External: none  steps; stays on the first floor Has following equipment at home: None  OCCUPATION: working self-employed, [employer]   PLOF: Independent with basic ADLs, Independent with household mobility without device, and  Independent with community mobility without device  PATIENT GOALS: I want to have less pain and feel good.     OBJECTIVE:  Note: Objective measures were completed at Evaluation unless otherwise noted.  DIAGNOSTIC FINDINGS: CLINICAL DATA:  Bilateral hip pain for 3 months   EXAM: DG HIP (WITH OR WITHOUT PELVIS) 5+V BILAT   COMPARISON:  None.   FINDINGS: Frontal view of the pelvis as well as frontal and frogleg lateral views of both hips are obtained. No acute fracture, subluxation, or dislocation within either hip. Joint spaces are relatively well preserved. Sacroiliac joints are unremarkable. Prominent spondylosis at the lumbosacral junction.   IMPRESSION: 1. Unremarkable pelvis and bilateral hips. 2. Lower lumbar degenerative changes.    PATIENT SURVEYS:  FOTO NT on eval   COGNITION: Overall cognitive status: Within functional limits for tasks assessed     SENSATION: None   EDEMA:  None   MUSCLE LENGTH: Hamstrings: WNL Thomas test: WNL   POSTURE: flexed trunk  and wide BOS   PALPATION: TTP along bilateral lateral hips, posterolateral hips and glutes  TRUNK: Limited in extension (pain both legs) All else WNL with pain (min ) in rotation    LOWER EXTREMITY ROM:  Passive ROM Right eval Left eval  Hip flexion 100 90  Hip extension        Hip abduction    Hip adduction    Hip internal rotation tight WNL  Hip external rotation Very tight , 30 deg Very tight,  30 deg   Knee flexion WNL WNL  Knee extension WNL WNL   Ankle dorsiflexion    Ankle plantarflexion    Ankle inversion    Ankle eversion     (Blank rows = not tested)  LOWER EXTREMITY MMT:  MMT Right eval Left eval  Hip flexion 4 4  Hip extension    Hip abduction    Hip adduction    Hip internal rotation    Hip external rotation    Knee flexion 5 5  Knee extension 5 4+  Ankle dorsiflexion    Ankle plantarflexion    Ankle inversion    Ankle eversion     (Blank rows = not  tested)  LOWER EXTREMITY SPECIAL TESTS:  Hip special tests: Luisa Hart (FABER) test: positive , Trendelenburg test: negative, and Hip scouring test: negativeR   FUNCTIONAL TESTS:  5 times sit to stand: 18.8 sec   GAIT: Distance walked: 100 Assistive device utilized: None Level of assistance: Modified independence Comments: slow pace    TODAY'S TREATMENT:  DATE: 1029/24  PT eval, HEP issued   PATIENT EDUCATION:  Education details: PT, cold vs heat, back vs hip pain , HEP  Person educated: Patient Education method: Explanation, Demonstration, Verbal cues, and Handouts Education comprehension: verbalized understanding and needs further education  HOME EXERCISE PROGRAM: Access Code: [email or web address] URL: [email or web address] Date: 07/08/2023 Prepared by: Karie Mainland  Exercises - Supine Hamstring Stretch with Strap  - 1 x daily - 7 x weekly - 1 sets - 3 reps - 30 hold - Hip Adductors and Hamstring Stretch with Strap  - 1 x daily - 7 x weekly - 1 sets - 3 reps - 30 hold - Supine ITB Stretch with Strap  - 1 x daily - 7 x weekly - 1 sets - 3 reps - 30 hold - Supine Bridge  - 1 x daily - 7 x weekly - 2 sets - 10 reps - 5 hold  ASSESSMENT:  CLINICAL IMPRESSION: Patient is a 62 y.o. female who was seen today for physical therapy evaluation and treatment for bilateral hip pain, radiating to bilateral LE with cramping.  Hip XR normal, spine spondylosis.  Sign and symptoms consistent with bursitis    OBJECTIVE IMPAIRMENTS: decreased activity tolerance, decreased balance, decreased endurance, decreased mobility, difficulty walking, decreased ROM, decreased strength, increased fascial restrictions, increased muscle spasms, impaired flexibility, obesity, and pain.   ACTIVITY LIMITATIONS: lifting, standing, squatting, and locomotion level  PARTICIPATION  LIMITATIONS: cleaning, interpersonal relationship, shopping, community activity, and occupation  PERSONAL FACTORS: Time since onset of injury/illness/exacerbation and 1-2 comorbidities: diabetes, obesity  are also affecting patient's functional outcome.   REHAB POTENTIAL: Excellent  CLINICAL DECISION MAKING: Stable/uncomplicated  EVALUATION COMPLEXITY: Low   GOALS: Goals reviewed with patient? Yes  SHORT TERM GOALS:  07/29/2023  Pt will be I with HEP  Baseline:given on eval  Goal status: INITIAL  2.  Pt will complete 2 min walk test and set goal  Baseline: NT on eval  Goal status: INITIAL  3.  Pt will improve FOTO score based on intake  Baseline: NT on eval  Goal status: INITIAL    LONG TERM GOALS Target date: 08/19/2023   Pt will be I with HEP  Baseline: unknown  Goal status: INITIAL  2.  Pt will complete 2 min walk TBA  Baseline: NT  Goal status: INITIAL  3.  Pt will improve FOTO score based on intake  Baseline: NT  Goal status: INITIAL  4.  Pt will be able to stand for 30 min with hip pain no more than moderate (5/10) for work and home tasks  Baseline: 5-10 min  Goal status: INITIAL  5.  Pt will be able to return to walking 3 x per week for 20-30 min for general health  Baseline:  Goal status: INITIAL  6.  Pt will be able to report LE cramping as min and occasional Baseline: severe, often  Goal status: INITIAL   PLAN:  PT FREQUENCY: 1-2x/week  PT DURATION: 6 weeks  PLANNED INTERVENTIONS: 97164- PT Re-evaluation, 97110-Therapeutic exercises, 97530- Therapeutic activity, 97112- Neuromuscular re-education, 97535- Self Care, 96295- Manual therapy, 97033- Ionotophoresis 4mg /ml Dexamethasone, Patient/Family education, Balance training, Taping, Dry Needling, Joint mobilization, Cryotherapy, and Moist heat  PLAN FOR NEXT SESSION: FOTO.  Check HEP. Begin strength.  2 min walk test    Christen Wardrop, PT 07/08/2023, 10:40 AM   Karie Mainland, PT 07/08/23  10:40 AM Phone: [phone] Fax: [phone]

## 2023-07-08 ENCOUNTER — Encounter: Payer: Self-pay | Admitting: Physical Therapy

## 2023-07-08 ENCOUNTER — Ambulatory Visit: Payer: 59 | Attending: Family Medicine | Admitting: Physical Therapy

## 2023-07-08 DIAGNOSIS — M545 Low back pain, unspecified: Secondary | ICD-10-CM | POA: Diagnosis not present

## 2023-07-08 DIAGNOSIS — G8929 Other chronic pain: Secondary | ICD-10-CM | POA: Insufficient documentation

## 2023-07-08 DIAGNOSIS — M7061 Trochanteric bursitis, right hip: Secondary | ICD-10-CM | POA: Diagnosis not present

## 2023-07-08 DIAGNOSIS — M7062 Trochanteric bursitis, left hip: Secondary | ICD-10-CM | POA: Diagnosis not present

## 2023-07-08 DIAGNOSIS — R252 Cramp and spasm: Secondary | ICD-10-CM | POA: Insufficient documentation

## 2023-07-08 DIAGNOSIS — M25552 Pain in left hip: Secondary | ICD-10-CM | POA: Diagnosis present

## 2023-07-08 DIAGNOSIS — M25551 Pain in right hip: Secondary | ICD-10-CM | POA: Diagnosis present

## 2023-07-09 NOTE — Progress Notes (Signed)
Lumbar spine x-ray shows some arthritis at the base of the spine.

## 2023-07-15 ENCOUNTER — Ambulatory Visit: Payer: 59 | Attending: Family Medicine

## 2023-07-15 DIAGNOSIS — R252 Cramp and spasm: Secondary | ICD-10-CM | POA: Insufficient documentation

## 2023-07-15 DIAGNOSIS — M25552 Pain in left hip: Secondary | ICD-10-CM | POA: Diagnosis present

## 2023-07-15 DIAGNOSIS — M25551 Pain in right hip: Secondary | ICD-10-CM | POA: Insufficient documentation

## 2023-07-15 NOTE — Therapy (Signed)
OUTPATIENT PHYSICAL THERAPY LOWER EXTREMITY EVALUATION   Patient Name: Lindsay Travis MRN: [id number] DOB:04-27-1961, 62 y.o., female Today's Date: 07/15/2023  END OF SESSION:  PT End of Session - 07/15/23 1040     Visit Number 2    Number of Visits 12    Date for PT Re-Evaluation 08/19/23    Authorization Type [employer] after 5 ?)    PT Start Time 1033    PT Stop Time 1105    PT Time Calculation (min) 32 min    Activity Tolerance Patient tolerated treatment well    Behavior During Therapy WFL for tasks assessed/performed              Past Medical History:  Diagnosis Date   Anemia    Arthritis    bilateral kneed and lower legs   Clotting disorder (HCC)    Diabetes mellitus    recent high blood sugar and has RX but not taking  med -b/c she doesn't think she  is diabetic, just had lots of sugar in her diet when dx'd.-on meds   Hx of adenomatous polyp of colon 07/2021   Repeat colonoscopy 2029   Hypertension    on meds   Shortness of breath    on exertion- has low hgb   Sickle cell trait [facility])    Past Surgical History:  Procedure Laterality Date   ABDOMINAL HYSTERECTOMY  09/11/2011   Procedure: HYSTERECTOMY ABDOMINAL;  Surgeon: Kathreen Cosier, MD;  Location: [facility];  Service: Gynecology;  Laterality: N/A;   COLONOSCOPY WITH PROPOFOL N/A 07/16/2021   Procedure: COLONOSCOPY WITH PROPOFOL;  Surgeon: Iva Boop, MD;  Location: [facility];  Service: Endoscopy;  Laterality: N/A;   POLYPECTOMY  07/16/2021   Procedure: POLYPECTOMY;  Surgeon: Iva Boop, MD;  Location: [facility];  Service: Endoscopy;;   Patient Active Problem List   Diagnosis Date Noted   Overactive bladder 06/25/2023   Dyslipidemia associated with type 2 diabetes mellitus (HCC) 06/19/2022   Statin myopathy 08/01/2021   Obesity, diabetes, and hypertension syndrome (HCC) 01/09/2021   Morbid obesity (HCC) 09/18/2019   Dyslipidemia    Type 2 diabetes mellitus with hyperglycemia, without  long-term current use of insulin (HCC) 08/07/2015   Varicose veins of bilateral lower extremities with other complications 02/24/2012   Uterine leiomyoma 04/18/2011   Hypertension associated with diabetes (HCC) 04/18/2011    PCP: Georgina Quint, MD   REFERRING PROVIDER: Rodolph Bong REFERRING DIAG: , MDM70.61,M70.62 (ICD-10-CM) - Trochanteric bursitis of both hips M54.50,G89.29 (ICD-10-CM) - Chronic bilateral low back pain without sciatica  THERAPY DIAG:  Pain of both hip joints  Cramp and spasm  Rationale for Evaluation and Treatment: Rehabilitation  ONSET DATE: acute on chronic   SUBJECTIVE:   SUBJECTIVE STATEMENT: Pt reports her hips are feeling a little better. Pt reports her thighs feel weak.  EVAL:Pt presents with chronic hip pain, back pain. Pt locates pain to bilat hip, low back, bilat buttocks, w/ radiating pain along the posterior aspect of both of her legs to her posterior thigh.  She has a new onset of leg cramping as well. Cramping mostly at night and afternoon.   She reports her legs are weak when standing > 10 min. She has recently added Potassium and has had an injections which seems to be helping.    PERTINENT HISTORY: Pt reports a stroke about 5 yrs ago with LE weakness  Diabetes HTN  PAIN:  Are you having pain? Yes: NPRS scale:  3/10 Pain location: hips  Pain description: aching  Aggravating factors: standing, walking  Relieving factors: Injection, stop walking , rest  Cramping can occur whenever it wants  Can be 10/10 with cramping   PRECAUTIONS: None   RED FLAGS: None   WEIGHT BEARING RESTRICTIONS: No  FALLS:  Has patient fallen in last 6 months? No "My balance is not good"   LIVING ENVIRONMENT: Lives with: lives with their daughter and son  Lives in: House/apartment Stairs: Yes: External: none  steps; stays on the first floor Has following equipment at home: None  OCCUPATION: working self-employed, [employer]   PLOF:  Independent with basic ADLs, Independent with household mobility without device, and Independent with community mobility without device  PATIENT GOALS: I want to have less pain and feel good.     OBJECTIVE:  Note: Objective measures were completed at Evaluation unless otherwise noted.  DIAGNOSTIC FINDINGS: CLINICAL DATA:  Bilateral hip pain for 3 months   EXAM: DG HIP (WITH OR WITHOUT PELVIS) 5+V BILAT   COMPARISON:  None.   FINDINGS: Frontal view of the pelvis as well as frontal and frogleg lateral views of both hips are obtained. No acute fracture, subluxation, or dislocation within either hip. Joint spaces are relatively well preserved. Sacroiliac joints are unremarkable. Prominent spondylosis at the lumbosacral junction.   IMPRESSION: 1. Unremarkable pelvis and bilateral hips. 2. Lower lumbar degenerative changes.    PATIENT SURVEYS:  FOTO NT on eval   COGNITION: Overall cognitive status: Within functional limits for tasks assessed     SENSATION: None   EDEMA:  None   MUSCLE LENGTH: Hamstrings: WNL Thomas test: WNL   POSTURE: flexed trunk  and wide BOS   PALPATION: TTP along bilateral lateral hips, posterolateral hips and glutes  TRUNK: Limited in extension (pain both legs) All else WNL with pain (min ) in rotation    LOWER EXTREMITY ROM:  Passive ROM Right eval Left eval  Hip flexion 100 90  Hip extension        Hip abduction    Hip adduction    Hip internal rotation tight WNL  Hip external rotation Very tight , 30 deg Very tight,  30 deg   Knee flexion WNL WNL  Knee extension WNL WNL   Ankle dorsiflexion    Ankle plantarflexion    Ankle inversion    Ankle eversion     (Blank rows = not tested)  LOWER EXTREMITY MMT:  MMT Right eval Left eval  Hip flexion 4 4  Hip extension    Hip abduction    Hip adduction    Hip internal rotation    Hip external rotation    Knee flexion 5 5  Knee extension 5 4+  Ankle dorsiflexion    Ankle  plantarflexion    Ankle inversion    Ankle eversion     (Blank rows = not tested)  LOWER EXTREMITY SPECIAL TESTS:  Hip special tests: Luisa Hart (FABER) test: positive , Trendelenburg test: negative, and Hip scouring test: negativeR   FUNCTIONAL TESTS:  5 times sit to stand: 18.8 sec   GAIT: Distance walked: 100 Assistive device utilized: None Level of assistance: Modified independence Comments: slow pace    TODAY'S TREATMENT: [facility] Adult PT Treatment:  DATE: 07/15/23 Therapeutic Exercise: LAQ 2x10 each 2.5# STS x10  SLR x10 each H/L clam 2x10 BluTB Bridge 2x10 S/L 2x10 Supine Hamstring Stretch with Strap 2 reps 30 hold Hip Adductors and Hamstring Stretch with Strap 3 reps 30 hold Supine ITB Stretch with Strap 2 reps 30 hold Updated HEP                                                                                                                              DATE: 1029/24  PT eval, HEP issued   PATIENT EDUCATION:  Education details: PT, cold vs heat, back vs hip pain , HEP  Person educated: Patient Education method: Explanation, Demonstration, Verbal cues, and Handouts Education comprehension: verbalized understanding and needs further education  HOME EXERCISE PROGRAM: Access Code: [email or web address] URL: [email or web address] Date: 07/15/2023 Prepared by: Joellyn Rued  Exercises - Supine Hamstring Stretch with Strap  - 1 x daily - 7 x weekly - 1 sets - 3 reps - 30 hold - Hip Adductors and Hamstring Stretch with Strap  - 1 x daily - 7 x weekly - 1 sets - 3 reps - 30 hold - Supine ITB Stretch with Strap  - 1 x daily - 7 x weekly - 1 sets - 3 reps - 30 hold - Supine Bridge  - 1 x daily - 7 x weekly - 2 sets - 10 reps - 5 hold - Seated Long Arc Quad  - 1 x daily - 7 x weekly - 2 sets - 10 reps - 3 hold - Sit to Stand Without Arm Support  - 1 x daily - 7 x weekly - 2 sets - 10 reps - 3 hold - Active Straight Leg Raise  with Quad Set  - 1 x daily - 7 x weekly - 2 sets - 10 reps - 3 hold - Hooklying Clamshell with Resistance  - 1 x daily - 7 x weekly - 2 sets - 10 reps - 3 hold - Sidelying Hip Abduction  - 1 x daily - 7 x weekly - 2 sets - 10 reps - 3 hold  ASSESSMENT:  CLINICAL IMPRESSION:  Pt reports consistent completion of her HEP. Pt states her hips feel better, but expressed concern about thigh weakness. PT focused on hip/LE strengthening. HEP was updated. Pt tolerated PT today without adverse effects. Pt will continue to benefit from skilled PT to address impairments for improved hip/LE function with minimized pain.    EVAL: Patient is a 62 y.o. female who was seen today for physical therapy evaluation and treatment for bilateral hip pain, radiating to bilateral LE with cramping.  Hip XR normal, spine spondylosis.  Sign and symptoms consistent with bursitis    OBJECTIVE IMPAIRMENTS: decreased activity tolerance, decreased balance, decreased endurance, decreased mobility, difficulty walking, decreased ROM, decreased strength, increased fascial restrictions, increased muscle spasms, impaired flexibility, obesity, and pain.   ACTIVITY LIMITATIONS: lifting, standing, squatting, and locomotion level  PARTICIPATION LIMITATIONS: cleaning,  interpersonal relationship, shopping, community activity, and occupation  PERSONAL FACTORS: Time since onset of injury/illness/exacerbation and 1-2 comorbidities: diabetes, obesity  are also affecting patient's functional outcome.   REHAB POTENTIAL: Excellent  CLINICAL DECISION MAKING: Stable/uncomplicated  EVALUATION COMPLEXITY: Low   GOALS: Goals reviewed with patient? Yes  SHORT TERM GOALS:  07/29/2023  Pt will be I with HEP  Baseline:given on eval  Goal status: ONGOING  2.  Pt will complete 2 min walk test and set goal  Baseline: NT on eval  Goal status: INITIAL  3.  Pt will improve FOTO score based on intake  Baseline: NT on eval  Goal status:  INITIAL    LONG TERM GOALS Target date: 08/19/2023   Pt will be I with HEP  Baseline: unknown  Goal status: INITIAL  2.  Pt will complete 2 min walk TBA  Baseline: NT  Goal status: INITIAL  3.  Pt will improve FOTO score based on intake  Baseline: NT  Goal status: INITIAL  4.  Pt will be able to stand for 30 min with hip pain no more than moderate (5/10) for work and home tasks  Baseline: 5-10 min  Goal status: INITIAL  5.  Pt will be able to return to walking 3 x per week for 20-30 min for general health  Baseline:  Goal status: INITIAL  6.  Pt will be able to report LE cramping as min and occasional Baseline: severe, often  Goal status: INITIAL   PLAN:  PT FREQUENCY: 1-2x/week  PT DURATION: 6 weeks  PLANNED INTERVENTIONS: 97164- PT Re-evaluation, 97110-Therapeutic exercises, 97530- Therapeutic activity, 97112- Neuromuscular re-education, 97535- Self Care, 16109- Manual therapy, 97033- Ionotophoresis 4mg /ml Dexamethasone, Patient/Family education, Balance training, Taping, Dry Needling, Joint mobilization, Cryotherapy, and Moist heat  PLAN FOR NEXT SESSION: FOTO.  Check HEP. Begin strength.  2 min walk test   [employer] MS, PT 07/15/23 11:27 AM

## 2023-07-21 NOTE — Therapy (Unsigned)
OUTPATIENT PHYSICAL THERAPY NOTE    Patient Name: Lindsay Travis MRN: [id number] DOB:05-Apr-1961, 62 y.o., female Today's Date: 07/22/2023  END OF SESSION:  PT End of Session - 07/22/23 1036     Visit Number 3    Number of Visits 12    Date for PT Re-Evaluation 08/19/23    Authorization Type [employer] after 5 ?)    PT Start Time 1035   20 min late   PT Stop Time 1100    PT Time Calculation (min) 25 min    Activity Tolerance Patient tolerated treatment well    Behavior During Therapy WFL for tasks assessed/performed               Past Medical History:  Diagnosis Date   Anemia    Arthritis    bilateral kneed and lower legs   Clotting disorder (HCC)    Diabetes mellitus    recent high blood sugar and has RX but not taking  med -b/c she doesn't think she  is diabetic, just had lots of sugar in her diet when dx'd.-on meds   Hx of adenomatous polyp of colon 07/2021   Repeat colonoscopy 2029   Hypertension    on meds   Shortness of breath    on exertion- has low hgb   Sickle cell trait [facility])    Past Surgical History:  Procedure Laterality Date   ABDOMINAL HYSTERECTOMY  09/11/2011   Procedure: HYSTERECTOMY ABDOMINAL;  Surgeon: Kathreen Cosier, MD;  Location: [facility];  Service: Gynecology;  Laterality: N/A;   COLONOSCOPY WITH PROPOFOL N/A 07/16/2021   Procedure: COLONOSCOPY WITH PROPOFOL;  Surgeon: Iva Boop, MD;  Location: [facility];  Service: Endoscopy;  Laterality: N/A;   POLYPECTOMY  07/16/2021   Procedure: POLYPECTOMY;  Surgeon: Iva Boop, MD;  Location: [facility];  Service: Endoscopy;;   Patient Active Problem List   Diagnosis Date Noted   Overactive bladder 06/25/2023   Dyslipidemia associated with type 2 diabetes mellitus (HCC) 06/19/2022   Statin myopathy 08/01/2021   Obesity, diabetes, and hypertension syndrome (HCC) 01/09/2021   Morbid obesity (HCC) 09/18/2019   Dyslipidemia    Type 2 diabetes mellitus with hyperglycemia, without  long-term current use of insulin (HCC) 08/07/2015   Varicose veins of bilateral lower extremities with other complications 02/24/2012   Uterine leiomyoma 04/18/2011   Hypertension associated with diabetes (HCC) 04/18/2011    PCP: Georgina Quint, MD   REFERRING PROVIDER: Rodolph Bong REFERRING DIAG: , MDM70.61,M70.62 (ICD-10-CM) - Trochanteric bursitis of both hips M54.50,G89.29 (ICD-10-CM) - Chronic bilateral low back pain without sciatica  THERAPY DIAG:  Pain of both hip joints  Cramp and spasm  Rationale for Evaluation and Treatment: Rehabilitation  ONSET DATE: acute on chronic   SUBJECTIVE:   SUBJECTIVE STATEMENT: Pain is 7/10   PERTINENT HISTORY: Pt reports a stroke about 5 yrs ago with LE weakness  Diabetes HTN  PAIN:  Are you having pain? Yes: NPRS scale: 3/10 Pain location: hips  Pain description: aching  Aggravating factors: standing, walking  Relieving factors: Injection, stop walking , rest  Cramping can occur whenever it wants  Can be 10/10 with cramping   PRECAUTIONS: None   RED FLAGS: None   WEIGHT BEARING RESTRICTIONS: No  FALLS:  Has patient fallen in last 6 months? No "My balance is not good"   LIVING ENVIRONMENT: Lives with: lives with their daughter and son  Lives in: House/apartment Stairs: Yes: External: none  steps; stays on  the first floor Has following equipment at home: None  OCCUPATION: working self-employed, Aeronautical engineer   PLOF: Independent with basic ADLs, Independent with household mobility without device, and Independent with community mobility without device  PATIENT GOALS: I want to have less pain and feel good.     OBJECTIVE:  Note: Objective measures were completed at Evaluation unless otherwise noted.  DIAGNOSTIC FINDINGS: CLINICAL DATA:  Bilateral hip pain for 3 months   EXAM: DG HIP (WITH OR WITHOUT PELVIS) 5+V BILAT   COMPARISON:  None.   FINDINGS: Frontal view of the pelvis as well as  frontal and frogleg lateral views of both hips are obtained. No acute fracture, subluxation, or dislocation within either hip. Joint spaces are relatively well preserved. Sacroiliac joints are unremarkable. Prominent spondylosis at the lumbosacral junction.   IMPRESSION: 1. Unremarkable pelvis and bilateral hips. 2. Lower lumbar degenerative changes.    PATIENT SURVEYS:  FOTO NT on eval   LEFS 44/80, 55% well   53/80 is GOAL  COGNITION: Overall cognitive status: Within functional limits for tasks assessed     SENSATION: None   EDEMA:  None   MUSCLE LENGTH: Hamstrings: WNL Thomas test: WNL   POSTURE: flexed trunk  and wide BOS   PALPATION: TTP along bilateral lateral hips, posterolateral hips and glutes  TRUNK: Limited in extension (pain both legs) All else WNL with pain (min ) in rotation    LOWER EXTREMITY ROM:  Passive ROM Right eval Left eval  Hip flexion 100 90  Hip extension        Hip abduction    Hip adduction    Hip internal rotation tight WNL  Hip external rotation Very tight , 30 deg Very tight,  30 deg   Knee flexion WNL WNL  Knee extension WNL WNL   Ankle dorsiflexion    Ankle plantarflexion    Ankle inversion    Ankle eversion     (Blank rows = not tested)  LOWER EXTREMITY MMT:  MMT Right eval Left eval  Hip flexion 4 4  Hip extension    Hip abduction    Hip adduction    Hip internal rotation    Hip external rotation    Knee flexion 5 5  Knee extension 5 4+  Ankle dorsiflexion    Ankle plantarflexion    Ankle inversion    Ankle eversion     (Blank rows = not tested)  LOWER EXTREMITY SPECIAL TESTS:  Hip special tests: Luisa Hart (FABER) test: positive , Trendelenburg test: negative, and Hip scouring test: negativeR   FUNCTIONAL TESTS:  5 times sit to stand: 18.8 sec   GAIT: Distance walked: 100 Assistive device utilized: None Level of assistance: Modified independence Comments: slow pace    TODAY'S TREATMENT:  [facility]  Adult PT Treatment:                                                DATE: 07/22/23 Therapeutic Exercise: Supine LTR SLR x 15 x 2 mod cues for this  Bridge with band x 15  Bridge with band /clam x 10  Hip abduction x 10 each side  LAQ hold 5 sec x 15 Wall sit x 5, 10 sec 2 min walk test 382 feet   [facility] Adult PT Treatment:  DATE: 07/15/23 Therapeutic Exercise: LAQ 2x10 each 2.5# STS x10  SLR x10 each H/L clam 2x10 BluTB Bridge 2x10 S/L 2x10 Supine Hamstring Stretch with Strap 2 reps 30 hold Hip Adductors and Hamstring Stretch with Strap 3 reps 30 hold Supine ITB Stretch with Strap 2 reps 30 hold Updated HEP                                                                                                                              DATE: 1029/24  PT eval, HEP issued   PATIENT EDUCATION:  Education details: PT, cold vs heat, back vs hip pain , HEP  Person educated: Patient Education method: Explanation, Demonstration, Verbal cues, and Handouts Education comprehension: verbalized understanding and needs further education  HOME EXERCISE PROGRAM: Access Code: [email or web address] URL: [email or web address] Date: 07/15/2023 Prepared by: Joellyn Rued  Exercises - Supine Hamstring Stretch with Strap  - 1 x daily - 7 x weekly - 1 sets - 3 reps - 30 hold - Hip Adductors and Hamstring Stretch with Strap  - 1 x daily - 7 x weekly - 1 sets - 3 reps - 30 hold - Supine ITB Stretch with Strap  - 1 x daily - 7 x weekly - 1 sets - 3 reps - 30 hold - Supine Bridge  - 1 x daily - 7 x weekly - 2 sets - 10 reps - 5 hold - Seated Long Arc Quad  - 1 x daily - 7 x weekly - 2 sets - 10 reps - 3 hold - Sit to Stand Without Arm Support  - 1 x daily - 7 x weekly - 2 sets - 10 reps - 3 hold - Active Straight Leg Raise with Quad Set  - 1 x daily - 7 x weekly - 2 sets - 10 reps - 3 hold - Hooklying Clamshell with Resistance  - 1 x daily - 7 x weekly - 2 sets  - 10 reps - 3 hold - Sidelying Hip Abduction  - 1 x daily - 7 x weekly - 2 sets - 10 reps - 3 hold  ASSESSMENT:  CLINICAL IMPRESSION:  Pt reports increased hip pain today.  She notices weakness with lifting her grandson or boxes at work.  She needed moderate cues for completing exercises today. She did arrive a bit late so session was abbreviated.  LEFS completed vs FOTO.  Goals set.    OBJECTIVE IMPAIRMENTS: decreased activity tolerance, decreased balance, decreased endurance, decreased mobility, difficulty walking, decreased ROM, decreased strength, increased fascial restrictions, increased muscle spasms, impaired flexibility, obesity, and pain.   ACTIVITY LIMITATIONS: lifting, standing, squatting, and locomotion level  PARTICIPATION LIMITATIONS: cleaning, interpersonal relationship, shopping, community activity, and occupation  PERSONAL FACTORS: Time since onset of injury/illness/exacerbation and 1-2 comorbidities: diabetes, obesity  are also affecting patient's functional outcome.   REHAB POTENTIAL: Excellent  CLINICAL DECISION MAKING: Stable/uncomplicated  EVALUATION COMPLEXITY: Low   GOALS: Goals reviewed with patient? Yes  SHORT TERM GOALS:  07/29/2023  Pt will be I with HEP  Baseline:given on eval  Goal status: ONGOING  2.  Pt will complete 2 min walk test and set goal  Baseline:362, pain 5/10  Goal status:MET   3.  Pt will improve FOTO/LEFS score based on intake  Baseline: NT on eval  Goal status: Deferred , mod to LEFS MET     LONG TERM GOALS Target date: 08/19/2023   Pt will be I with HEP  Baseline: unknown  Goal status: INITIAL  2.  Pt will complete 2 min walk , 400 feet with pain in hips < 3/10  Baseline: 362 feet Goal status: INITIAL  3.  Pt will improve LEFS score to 53/80  Baseline: 44/80 Goal status: INITIAL  4.  Pt will be able to stand for 30 min with hip pain no more than moderate (5/10) for work and home tasks  Baseline: 5-10 min  Goal  status: INITIAL  5.  Pt will be able to return to walking 3 x per week for 20-30 min for general health  Baseline:  Goal status: INITIAL  6.  Pt will be able to report LE cramping as min and occasional Baseline: severe, often  Goal status: INITIAL   PLAN:  PT FREQUENCY: 1-2x/week  PT DURATION: 6 weeks  PLANNED INTERVENTIONS: 97164- PT Re-evaluation, 97110-Therapeutic exercises, 97530- Therapeutic activity, 97112- Neuromuscular re-education, 97535- Self Care, 16109- Manual therapy, 97033- Ionotophoresis 4mg /ml Dexamethasone, Patient/Family education, Balance training, Taping, Dry Needling, Joint mobilization, Cryotherapy, and Moist heat  PLAN FOR NEXT SESSION: Check HEP. Begin strength.   Karie Mainland, PT 07/22/23 11:53 AM Phone: [phone] Fax: [phone]

## 2023-07-22 ENCOUNTER — Ambulatory Visit: Payer: 59 | Admitting: Physical Therapy

## 2023-07-22 ENCOUNTER — Encounter: Payer: Self-pay | Admitting: Physical Therapy

## 2023-07-22 DIAGNOSIS — R252 Cramp and spasm: Secondary | ICD-10-CM

## 2023-07-22 DIAGNOSIS — M25551 Pain in right hip: Secondary | ICD-10-CM

## 2023-07-24 ENCOUNTER — Ambulatory Visit (HOSPITAL_BASED_OUTPATIENT_CLINIC_OR_DEPARTMENT_OTHER)
Admission: RE | Admit: 2023-07-24 | Discharge: 2023-07-24 | Disposition: A | Discharge status: Home / Self Care | Payer: 59 | Source: Ambulatory Visit | Attending: Emergency Medicine | Admitting: Emergency Medicine

## 2023-07-24 ENCOUNTER — Encounter (HOSPITAL_BASED_OUTPATIENT_CLINIC_OR_DEPARTMENT_OTHER): Payer: Self-pay | Admitting: Radiology

## 2023-07-24 DIAGNOSIS — Z1231 Encounter for screening mammogram for malignant neoplasm of breast: Secondary | ICD-10-CM | POA: Insufficient documentation

## 2023-07-25 ENCOUNTER — Ambulatory Visit: Payer: 59

## 2023-07-25 ENCOUNTER — Telehealth: Payer: Self-pay

## 2023-07-25 NOTE — Telephone Encounter (Signed)
LVM re: no show appt 07/25/23. He pt has no additional appts scheduled, so she was reminded to call and schedule an appt if she wishes to continue with PT. Pt was also advised re: the attendance policy

## 2023-07-28 ENCOUNTER — Ambulatory Visit (INDEPENDENT_AMBULATORY_CARE_PROVIDER_SITE_OTHER): Payer: 59 | Admitting: Family Medicine

## 2023-07-28 VITALS — BP 128/84 | HR 83 | Wt 233.0 lb

## 2023-07-28 DIAGNOSIS — M5416 Radiculopathy, lumbar region: Secondary | ICD-10-CM

## 2023-07-28 NOTE — Patient Instructions (Signed)
Thank you for coming in today.   You should hear from MRI scheduling within 1 week. If you do not hear please let me know.    Recheck after we get the MRI results back.

## 2023-07-28 NOTE — Progress Notes (Signed)
   Rubin Payor, PhD, LAT, ATC acting as a scribe for Clementeen Graham, MD.  Lindsay Travis is a 62 y.o. female who presents to [facility] at [facility] today for f/u bilat hip and low back pain. Pt was last seen by Dr. Denyse Amass on 06/16/23 and was given bilat GT steroid injections and was referred to PT, completing 3 visits.   Today, pt reports her hips are feeling pretty good. She c/o cramping in both legs, esp w/ standing, all over, w/ a feeling of heaviness. No LBP.   Dx imaging: 06/16/23 L-spine XR  11/07/21 Bilat hip/pelvis  Pertinent review of systems: No fevers or chills  Relevant historical information: Diabetes   Exam:  BP 128/84   Pulse 83   Wt 233 lb (105.7 kg)   LMP 09/10/2011   SpO2 96%   BMI 37.61 kg/m  General: Well Developed, well nourished, and in no acute distress.   MSK: L-spine: Normal appearing Nontender palpation spinal midline. Lower extremity strength is intact. Reflexes are intact penis and sensation is mildly decreased.    Lab and Radiology Results  EXAM: LUMBAR SPINE - 2-3 VIEW   COMPARISON:  None Available.   FINDINGS: There are 5 non-rib-bearing lumbar vertebra. Slight dextroscoliotic curvature. Straightening of upper normal lumbar lordosis. No evidence of fracture or compression deformity. Anterior spurring at multiple levels with mild L1-L2 and L2-L3 disc space narrowing. There is mild lower lumbar facet hypertrophy. No evidence of pars defects or focal bone abnormalities. The sacroiliac joints are congruent.   IMPRESSION: Mild degenerative disc disease at L1-L2 and L2-L3. Mild lower lumbar facet hypertrophy.     Electronically Signed   By: Narda Rutherford M.D.   On: 07/08/2023 12:22 I, Clementeen Graham, personally (independently) visualized and performed the interpretation of the images attached in this note.      Assessment and Plan: 62 y.o. female with chronic low back pain with pain and weakness radiating down  her bilateral lower extremities in an L5 dermatomal pattern.  Her hip pain that she was seen for previously has improved but her back pain continues and now she has weakness and radiating pain down her legs.  She does have degenerative changes seen on x-ray that could contribute to spinal stenosis or lumbar neuroforaminal stenosis.  Plan on MRI to further characterize source of leg weakness.  Recheck after MRI likely.   PDMP not reviewed this encounter. Orders Placed This Encounter  Procedures   MR Lumbar Spine Wo Contrast    Standing Status:   Future    Standing Expiration Date:   07/27/2024    Order Specific Question:   What is the patient's sedation requirement?    Answer:   No Sedation    Order Specific Question:   Does the patient have a pacemaker or implanted devices?    Answer:   No    Order Specific Question:   Preferred imaging location?    Answer:   [facility] (table limit-550lbs)   No orders of the defined types were placed in this encounter.    Discussed warning signs or symptoms. Please see discharge instructions. Patient expresses understanding.   The above documentation has been reviewed and is accurate and complete Clementeen Graham, M.D.

## 2023-07-28 NOTE — Addendum Note (Signed)
Addended by: Evon Slack on: 07/28/2023 03:46 PM   Modules accepted: Orders

## 2023-07-29 ENCOUNTER — Ambulatory Visit: Payer: 59

## 2023-07-29 NOTE — Therapy (Signed)
OUTPATIENT PHYSICAL THERAPY NOTE    Patient Name: Lindsay Travis MRN: [id number] DOB:[birth year], 62 y.o., female Today's Date: 07/30/2023  END OF SESSION:  PT End of Session - 07/30/23 1335     Visit Number 4    Number of Visits 12    Date for PT Re-Evaluation 08/19/23    Authorization Type [employer] after 5 ?)    Authorization - Visit Number 3    Authorization - Number of Visits 5    PT Start Time 1334    PT Stop Time 1414    PT Time Calculation (min) 40 min    Activity Tolerance Patient tolerated treatment well    Behavior During Therapy WFL for tasks assessed/performed                Past Medical History:  Diagnosis Date   Anemia    Arthritis    bilateral kneed and lower legs   Clotting disorder (HCC)    Diabetes mellitus    recent high blood sugar and has RX but not taking  med -b/c she doesn't think she  is diabetic, just had lots of sugar in her diet when dx'd.-on meds   Hx of adenomatous polyp of colon 07/2021   Repeat colonoscopy 2029   Hypertension    on meds   Shortness of breath    on exertion- has low hgb   Sickle cell trait [facility])    Past Surgical History:  Procedure Laterality Date   ABDOMINAL HYSTERECTOMY  09/11/2011   Procedure: HYSTERECTOMY ABDOMINAL;  Surgeon: Kathreen Cosier, MD;  Location: [facility];  Service: Gynecology;  Laterality: N/A;   COLONOSCOPY WITH PROPOFOL N/A 07/16/2021   Procedure: COLONOSCOPY WITH PROPOFOL;  Surgeon: Iva Boop, MD;  Location: [facility];  Service: Endoscopy;  Laterality: N/A;   POLYPECTOMY  07/16/2021   Procedure: POLYPECTOMY;  Surgeon: Iva Boop, MD;  Location: [facility];  Service: Endoscopy;;   Patient Active Problem List   Diagnosis Date Noted   Overactive bladder 06/25/2023   Dyslipidemia associated with type 2 diabetes mellitus (HCC) 06/19/2022   Statin myopathy 08/01/2021   Obesity, diabetes, and hypertension syndrome (HCC) 01/09/2021   Morbid obesity (HCC) 09/18/2019   Dyslipidemia     Type 2 diabetes mellitus with hyperglycemia, without long-term current use of insulin (HCC) 08/07/2015   Varicose veins of bilateral lower extremities with other complications 02/24/2012   Uterine leiomyoma 04/18/2011   Hypertension associated with diabetes (HCC) 04/18/2011    PCP: Georgina Quint, MD   REFERRING PROVIDER: Rodolph Bong REFERRING DIAG: , MDM70.61,M70.62 (ICD-10-CM) - Trochanteric bursitis of both hips M54.50,G89.29 (ICD-10-CM) - Chronic bilateral low back pain without sciatica  THERAPY DIAG:  Pain of both hip joints  Cramp and spasm  Rationale for Evaluation and Treatment: Rehabilitation  ONSET DATE: acute on chronic   SUBJECTIVE:   SUBJECTIVE STATEMENT: Hip pain is better. She notes she is experiencing cramping of her thighs with lifting and prolonged walking or standing. Pt reports she is completing her HEP consistently.   PERTINENT HISTORY: Pt reports a stroke about 5 yrs ago with LE weakness  Diabetes HTN  PAIN:  Are you having pain? Yes: NPRS scale: 3/10 Pain location: hips  Pain description: aching  Aggravating factors: standing, walking  Relieving factors: Injection, stop walking , rest  Cramping can occur whenever it wants  Can be 10/10 with cramping   PRECAUTIONS: None   RED FLAGS: None   WEIGHT BEARING RESTRICTIONS: No  FALLS:  Has patient fallen in last 6 months? No "My balance is not good"   LIVING ENVIRONMENT: Lives with: lives with their daughter and son  Lives in: House/apartment Stairs: Yes: External: none  steps; stays on the first floor Has following equipment at home: None  OCCUPATION: working self-employed, [employer]   PLOF: Independent with basic ADLs, Independent with household mobility without device, and Independent with community mobility without device  PATIENT GOALS: I want to have less pain and feel good.     OBJECTIVE:  Note: Objective measures were completed at Evaluation unless  otherwise noted.  DIAGNOSTIC FINDINGS: CLINICAL DATA:  Bilateral hip pain for 3 months   EXAM: DG HIP (WITH OR WITHOUT PELVIS) 5+V BILAT   COMPARISON:  None.   FINDINGS: Frontal view of the pelvis as well as frontal and frogleg lateral views of both hips are obtained. No acute fracture, subluxation, or dislocation within either hip. Joint spaces are relatively well preserved. Sacroiliac joints are unremarkable. Prominent spondylosis at the lumbosacral junction.   IMPRESSION: 1. Unremarkable pelvis and bilateral hips. 2. Lower lumbar degenerative changes.    PATIENT SURVEYS:  FOTO NT on eval   LEFS 44/80, 55% well   53/80 is GOAL  COGNITION: Overall cognitive status: Within functional limits for tasks assessed     SENSATION: None   EDEMA:  None   MUSCLE LENGTH: Hamstrings: WNL Thomas test: WNL   POSTURE: flexed trunk  and wide BOS   PALPATION: TTP along bilateral lateral hips, posterolateral hips and glutes  TRUNK: Limited in extension (pain both legs) All else WNL with pain (min ) in rotation    LOWER EXTREMITY ROM:  Passive ROM Right eval Left eval  Hip flexion 100 90  Hip extension        Hip abduction    Hip adduction    Hip internal rotation tight WNL  Hip external rotation Very tight , 30 deg Very tight,  30 deg   Knee flexion WNL WNL  Knee extension WNL WNL   Ankle dorsiflexion    Ankle plantarflexion    Ankle inversion    Ankle eversion     (Blank rows = not tested)  LOWER EXTREMITY MMT:  MMT Right eval Left eval  Hip flexion 4 4  Hip extension    Hip abduction    Hip adduction    Hip internal rotation    Hip external rotation    Knee flexion 5 5  Knee extension 5 4+  Ankle dorsiflexion    Ankle plantarflexion    Ankle inversion    Ankle eversion     (Blank rows = not tested)  LOWER EXTREMITY SPECIAL TESTS:  Hip special tests: Luisa Hart (FABER) test: positive , Trendelenburg test: negative, and Hip scouring test: negativeR    FUNCTIONAL TESTS:  5 times sit to stand: 18.8 sec   GAIT: Distance walked: 100 Assistive device utilized: None Level of assistance: Modified independence Comments: slow pace    TODAY'S TREATMENT: [facility] Adult PT Treatment:                                                DATE: 07/30/23 Therapeutic Exercise: Nu Step 5 mins L5 UE/LE LAQ 2x10 each 3# STS x10  SLR x10 each H/L clam 2x10 BluTB Bridge 2x10 Hip add sets c ball squeeze x15 LTR  x10 Supine Hamstring Stretch with Strap 1 reps 30 hold Hip Adductors with Strap 1 reps 30 hold Supine ITB Stretch with Strap 1 reps 30 hold  [facility] Adult PT Treatment:                                                DATE: 07/22/23 Therapeutic Exercise: Supine LTR SLR x 15 x 2 mod cues for this  Bridge with band x 15  Bridge with band /clam x 10  Hip abduction x 10 each side  LAQ hold 5 sec x 15 Wall sit x 5, 10 sec 2 min walk test 382 feet   [facility] Adult PT Treatment:                                                DATE: 07/15/23 Therapeutic Exercise: LAQ 2x10 each 2.5# STS x10  SLR x10 each H/L clam 2x10 BluTB Bridge 2x10 S/L 2x10 Supine Hamstring Stretch with Strap 2 reps 30 hold Hip Adductors and Hamstring Stretch with Strap 3 reps 30 hold Supine ITB Stretch with Strap 2 reps 30 hold Updated HEP                                                                                                                              DATE: 1029/24  PT eval, HEP issued   PATIENT EDUCATION:  Education details: PT, cold vs heat, back vs hip pain , HEP  Person educated: Patient Education method: Explanation, Demonstration, Verbal cues, and Handouts Education comprehension: verbalized understanding and needs further education  HOME EXERCISE PROGRAM: Access Code: [email or web address] URL: [email or web address] Date: 07/15/2023 Prepared by: Joellyn Rued  Exercises - Supine Hamstring Stretch with Strap  - 1 x daily - 7 x weekly - 1 sets - 3 reps  - 30 hold - Hip Adductors and Hamstring Stretch with Strap  - 1 x daily - 7 x weekly - 1 sets - 3 reps - 30 hold - Supine ITB Stretch with Strap  - 1 x daily - 7 x weekly - 1 sets - 3 reps - 30 hold - Supine Bridge  - 1 x daily - 7 x weekly - 2 sets - 10 reps - 5 hold - Seated Long Arc Quad  - 1 x daily - 7 x weekly - 2 sets - 10 reps - 3 hold - Sit to Stand Without Arm Support  - 1 x daily - 7 x weekly - 2 sets - 10 reps - 3 hold - Active Straight Leg Raise with Quad Set  - 1 x daily - 7 x weekly - 2 sets - 10 reps - 3 hold - Hooklying Clamshell with Resistance  -  1 x daily - 7 x weekly - 2 sets - 10 reps - 3 hold - Sidelying Hip Abduction  - 1 x daily - 7 x weekly - 2 sets - 10 reps - 3 hold  ASSESSMENT:  CLINICAL IMPRESSION:  Hip pain is improved. Pt does report cramping of her thighs continues. PT was completed for hip/LE strengthening. Pt tolerated PT today without adverse effects. Pt will continue to benefit from skilled PT to address impairments for improved function. Will assess LTGs the next PT session.  OBJECTIVE IMPAIRMENTS: decreased activity tolerance, decreased balance, decreased endurance, decreased mobility, difficulty walking, decreased ROM, decreased strength, increased fascial restrictions, increased muscle spasms, impaired flexibility, obesity, and pain.   ACTIVITY LIMITATIONS: lifting, standing, squatting, and locomotion level  PARTICIPATION LIMITATIONS: cleaning, interpersonal relationship, shopping, community activity, and occupation  PERSONAL FACTORS: Time since onset of injury/illness/exacerbation and 1-2 comorbidities: diabetes, obesity  are also affecting patient's functional outcome.   REHAB POTENTIAL: Excellent  CLINICAL DECISION MAKING: Stable/uncomplicated  EVALUATION COMPLEXITY: Low   GOALS: Goals reviewed with patient? Yes  SHORT TERM GOALS:  07/29/2023  Pt will be I with HEP  Baseline:given on eval  Goal status: ONGOING  2.  Pt will complete 2  min walk test and set goal  Baseline:362, pain 5/10  Goal status:MET   3.  Pt will improve FOTO/LEFS score based on intake  Baseline: NT on eval  Goal status: Deferred , mod to LEFS MET     LONG TERM GOALS Target date: 08/19/2023   Pt will be I with HEP  Baseline: unknown  Goal status: ONGOING  2.  Pt will complete 2 min walk , 400 feet with pain in hips < 3/10  Baseline: 362 feet Goal status: INITIAL  3.  Pt will improve LEFS score to 53/80  Baseline: 44/80 Goal status: INITIAL  4.  Pt will be able to stand for 30 min with hip pain no more than moderate (5/10) for work and home tasks  Baseline: 5-10 min  Goal status: INITIAL  5.  Pt will be able to return to walking 3 x per week for 20-30 min for general health  Baseline:  Goal status: INITIAL  6.  Pt will be able to report LE cramping as min and occasional Baseline: severe, often  Goal status: INITIAL   PLAN:  PT FREQUENCY: 1-2x/week  PT DURATION: 6 weeks  PLANNED INTERVENTIONS: 97164- PT Re-evaluation, 97110-Therapeutic exercises, 97530- Therapeutic activity, 97112- Neuromuscular re-education, 97535- Self Care, 52841- Manual therapy, 97033- Ionotophoresis 4mg /ml Dexamethasone, Patient/Family education, Balance training, Taping, Dry Needling, Joint mobilization, Cryotherapy, and Moist heat  PLAN FOR NEXT SESSION: Check HEP. Begin strength.   Jonathin Heinicke MS, PT 07/30/23 2:17 PM

## 2023-07-30 ENCOUNTER — Telehealth: Payer: Self-pay

## 2023-07-30 ENCOUNTER — Ambulatory Visit: Payer: 59

## 2023-07-30 DIAGNOSIS — M25551 Pain in right hip: Secondary | ICD-10-CM | POA: Diagnosis not present

## 2023-07-30 DIAGNOSIS — R252 Cramp and spasm: Secondary | ICD-10-CM

## 2023-07-30 DIAGNOSIS — M25552 Pain in left hip: Secondary | ICD-10-CM

## 2023-07-30 NOTE — Telephone Encounter (Signed)
[facility] called informing us that patient wanted to go to a place in [place]. [facility] is not in network. I will work on trying to find a [place] site that is in [place]

## 2023-07-31 NOTE — Telephone Encounter (Signed)
Location changed to [facility] location

## 2023-07-31 NOTE — Addendum Note (Signed)
Addended by: Debbe Odea R on: 07/31/2023 10:25 AM   Modules accepted: Orders

## 2023-08-12 ENCOUNTER — Other Ambulatory Visit (HOSPITAL_BASED_OUTPATIENT_CLINIC_OR_DEPARTMENT_OTHER): Payer: Self-pay | Admitting: Family Medicine

## 2023-08-12 DIAGNOSIS — M5416 Radiculopathy, lumbar region: Secondary | ICD-10-CM

## 2023-08-19 NOTE — Therapy (Addendum)
OUTPATIENT PHYSICAL THERAPY NOTE/ Re-Cert/DC   Patient Name: Lindsay Travis MRN: [id number] DOB:Aug 23, 1961, 62 y.o., female Today's Date: 08/20/2023  END OF SESSION:  PT End of Session - 08/20/23 0938     Visit Number 5    Number of Visits 11    Date for PT Re-Evaluation 10/10/23    Authorization Type [employer] after 5th visit)    Authorization - Visit Number 4    Authorization - Number of Visits 5    PT Start Time [phone]    PT Stop Time 1015    PT Time Calculation (min) 41 min    Activity Tolerance Patient tolerated treatment well    Behavior During Therapy WFL for tasks assessed/performed                 Past Medical History:  Diagnosis Date   Anemia    Arthritis    bilateral kneed and lower legs   Clotting disorder (HCC)    Diabetes mellitus    recent high blood sugar and has RX but not taking  med -b/c she doesn't think she  is diabetic, just had lots of sugar in her diet when dx'd.-on meds   Hx of adenomatous polyp of colon 07/2021   Repeat colonoscopy 2029   Hypertension    on meds   Shortness of breath    on exertion- has low hgb   Sickle cell trait [facility])    Past Surgical History:  Procedure Laterality Date   ABDOMINAL HYSTERECTOMY  09/11/2011   Procedure: HYSTERECTOMY ABDOMINAL;  Surgeon: Kathreen Cosier, MD;  Location: [facility];  Service: Gynecology;  Laterality: N/A;   COLONOSCOPY WITH PROPOFOL N/A 07/16/2021   Procedure: COLONOSCOPY WITH PROPOFOL;  Surgeon: Iva Boop, MD;  [facility]: [facility];  Service: Endoscopy;  Laterality: N/A;   POLYPECTOMY  07/16/2021   Procedure: POLYPECTOMY;  Surgeon: Iva Boop, MD;  [facility]: [facility];  Service: Endoscopy;;   Patient Active Problem List   Diagnosis Date Noted   Overactive bladder 06/25/2023   Dyslipidemia associated with type 2 diabetes mellitus (HCC) 06/19/2022   Statin myopathy 08/01/2021   Obesity, diabetes, and hypertension syndrome (HCC) 01/09/2021   Morbid obesity (HCC)  09/18/2019   Dyslipidemia    Type 2 diabetes mellitus with hyperglycemia, without long-term current use of insulin (HCC) 08/07/2015   Varicose veins of bilateral lower extremities with other complications 02/24/2012   Uterine leiomyoma 04/18/2011   Hypertension associated with diabetes (HCC) 04/18/2011    PCP: Georgina Quint, MD   REFERRING PROVIDER: Rodolph Bong REFERRING DIAG: , MDM70.61,M70.62 (ICD-10-CM) - Trochanteric bursitis of both hips M54.50,G89.29 (ICD-10-CM) - Chronic bilateral low back pain without sciatica  THERAPY DIAG:  Pain of both hip joints  Cramp and spasm  Rationale for Evaluation and Treatment: Rehabilitation  ONSET DATE: acute on chronic   SUBJECTIVE:   SUBJECTIVE STATEMENT: Pt reports she is continuing to experience cramping of her thighs in AM or prolonged standing/walking. Pt reports she is not experiencing low back or hip pain. Pt reports she is taking magnesium.   PERTINENT HISTORY: Pt reports a stroke about 5 yrs ago with LE weakness  Diabetes HTN  PAIN:  Are you having pain? Yes: NPRS scale: 0/10 Pain location: hips  Pain description: aching  Aggravating factors: standing, walking  Relieving factors: Injection, stop walking , rest  Cramping can occur whenever it wants  Can be 10/10 with cramping   PRECAUTIONS: None   RED FLAGS: None  WEIGHT BEARING RESTRICTIONS: No  FALLS:  Has patient fallen in last 6 months? No "My balance is not good"   LIVING ENVIRONMENT: Lives with: lives with their daughter and son  Lives in: House/apartment Stairs: Yes: External: none  steps; stays on the first floor Has following equipment at home: None  OCCUPATION: working self-employed, [employer]   PLOF: Independent with basic ADLs, Independent with household mobility without device, and Independent with community mobility without device  PATIENT GOALS: I want to have less pain and feel good.     OBJECTIVE:  Note: Objective  measures were completed at Evaluation unless otherwise noted.  DIAGNOSTIC FINDINGS: CLINICAL DATA:  Bilateral hip pain for 3 months   EXAM: DG HIP (WITH OR WITHOUT PELVIS) 5+V BILAT   COMPARISON:  None.   FINDINGS: Frontal view of the pelvis as well as frontal and frogleg lateral views of both hips are obtained. No acute fracture, subluxation, or dislocation within either hip. Joint spaces are relatively well preserved. Sacroiliac joints are unremarkable. Prominent spondylosis at the lumbosacral junction.   IMPRESSION: 1. Unremarkable pelvis and bilateral hips. 2. Lower lumbar degenerative changes.    PATIENT SURVEYS:  FOTO NT on eval   LEFS 44/80, 55% well   53/80 is GOAL  COGNITION: Overall cognitive status: Within functional limits for tasks assessed     SENSATION: None   EDEMA:  None   MUSCLE LENGTH: Hamstrings: WNL Thomas test: WNL   POSTURE: flexed trunk  and wide BOS   PALPATION: TTP along bilateral lateral hips, posterolateral hips and glutes  TRUNK: Limited in extension (pain both legs) All else WNL with pain (min ) in rotation    LOWER EXTREMITY ROM:  Passive ROM Right eval Left eval  Hip flexion 100 90  Hip extension        Hip abduction    Hip adduction    Hip internal rotation tight WNL  Hip external rotation Very tight , 30 deg Very tight,  30 deg   Knee flexion WNL WNL  Knee extension WNL WNL   Ankle dorsiflexion    Ankle plantarflexion    Ankle inversion    Ankle eversion     (Blank rows = not tested)  LOWER EXTREMITY MMT:  MMT Right eval Left eval  Hip flexion 4 4  Hip extension    Hip abduction    Hip adduction    Hip internal rotation    Hip external rotation    Knee flexion 5 5  Knee extension 5 4+  Ankle dorsiflexion    Ankle plantarflexion    Ankle inversion    Ankle eversion     (Blank rows = not tested)  LOWER EXTREMITY SPECIAL TESTS:  Hip special tests: Luisa Hart (FABER) test: positive , Trendelenburg  test: negative, and Hip scouring test: negativeR   FUNCTIONAL TESTS:  5 times sit to stand: 18.8 sec   GAIT: Distance walked: 100 Assistive device utilized: None Level of assistance: Modified independence Comments: slow pace    TODAY'S TREATMENT: [facility] Adult PT Treatment:                                                DATE: 08/20/23 Therapeutic Exercise: Nu Step 5 mins L5 UE/LE LAQ 2x10 each 3# SLR 2x10 each H/L clam 2x10 BluTB Bridge 2x10 3" Hip add sets c ball  squeeze x15 LTR x10 STS x10  460'  [facility] Adult PT Treatment:                                                DATE: 07/30/23 Therapeutic Exercise: Nu Step 5 mins L5 UE/LE LAQ 2x10 each 3# STS x10  SLR x10 each H/L clam 2x10 BluTB Bridge 2x10 Hip add sets c ball squeeze x15 LTR x10 Supine Hamstring Stretch with Strap 1 reps 30 hold Hip Adductors with Strap 1 reps 30 hold Supine ITB Stretch with Strap 1 reps 30 hold  [facility] Adult PT Treatment:                                                DATE: 07/22/23 Therapeutic Exercise: Supine LTR SLR x 15 x 2 mod cues for this  Bridge with band x 15  Bridge with band /clam x 10  Hip abduction x 10 each side  LAQ hold 5 sec x 15 Wall sit x 5, 10 sec 2 min walk test 382 feet                                                                                                                              DATE: 1029/24  PT eval, HEP issued   PATIENT EDUCATION:  Education details: PT, cold vs heat, back vs hip pain , HEP  Person educated: Patient Education method: Explanation, Demonstration, Verbal cues, and Handouts Education comprehension: verbalized understanding and needs further education  HOME EXERCISE PROGRAM: Access Code: [email or web address] URL: [email or web address] Date: 07/15/2023 Prepared by: Joellyn Rued  Exercises - Supine Hamstring Stretch with Strap  - 1 x daily - 7 x weekly - 1 sets - 3 reps - 30 hold - Hip Adductors and Hamstring Stretch with  Strap  - 1 x daily - 7 x weekly - 1 sets - 3 reps - 30 hold - Supine ITB Stretch with Strap  - 1 x daily - 7 x weekly - 1 sets - 3 reps - 30 hold - Supine Bridge  - 1 x daily - 7 x weekly - 2 sets - 10 reps - 5 hold - Seated Long Arc Quad  - 1 x daily - 7 x weekly - 2 sets - 10 reps - 3 hold - Sit to Stand Without Arm Support  - 1 x daily - 7 x weekly - 2 sets - 10 reps - 3 hold - Active Straight Leg Raise with Quad Set  - 1 x daily - 7 x weekly - 2 sets - 10 reps - 3 hold - Hooklying Clamshell with Resistance  - 1 x daily - 7 x weekly - 2  sets - 10 reps - 3 hold - Sidelying Hip Abduction  - 1 x daily - 7 x weekly - 2 sets - 10 reps - 3 hold  ASSESSMENT:  CLINICAL IMPRESSION:  Bilat hip pain continues to be improved. Pt's primary complaint is of bilat thigh cramping and heaviness in the AM and with prolonged standing/walking. Reassessed 2 min walking test with pt's distance increased by the MCID. With therex today, pt consistently reported heaviness of her legs which seemed related to weakness. Pt was encouraged to return to walking 15-20 mins as tolerated 3x a week to address activity tolerance deficit. Pt is making subjective and objective gains. Pt will continue to benefit from skilled PT 1w6 to address impairments for improved lower body function with minimized pain.    OBJECTIVE IMPAIRMENTS: decreased activity tolerance, decreased balance, decreased endurance, decreased mobility, difficulty walking, decreased ROM, decreased strength, increased fascial restrictions, increased muscle spasms, impaired flexibility, obesity, and pain.   ACTIVITY LIMITATIONS: lifting, standing, squatting, and locomotion level  PARTICIPATION LIMITATIONS: cleaning, interpersonal relationship, shopping, community activity, and occupation  PERSONAL FACTORS: Time since onset of injury/illness/exacerbation and 1-2 comorbidities: diabetes, obesity  are also affecting patient's functional outcome.   REHAB POTENTIAL:  Excellent  CLINICAL DECISION MAKING: Stable/uncomplicated  EVALUATION COMPLEXITY: Low   GOALS: Goals reviewed with patient? Yes  SHORT TERM GOALS:  07/29/2023  Pt will be I with HEP  Baseline:given on eval  Goal status: MET  2.  Pt will complete 2 min walk test and set goal  Baseline:362, pain 5/10  Goal status:MET   3.  Pt will improve FOTO/LEFS score based on intake  Baseline: NT on eval  Goal status: Deferred , mod to LEFS MET     LONG TERM GOALS Target date: 10/10/23  Pt will be I with HEP  Baseline: unknown  Goal status: ONGOING  2.  Pt will complete 2 min walk , 400 feet with pain in hips < 3/10  Baseline: 362 feet 08/20/23: 460' no report of hip pain or cramping Goal status: MET  3.  Pt will improve LEFS score to 53/80  Baseline: 44/80 Goal status: ONGOING  4.  Pt will be able to stand for 30 min with hip pain no more than moderate (5/10) for work and home tasks  Baseline: 5-10 min  08/20/23: 15 mins Goal status: ONGOING  5.  Pt will be able to return to walking 3 x per week for 20-30 min for general health  Baseline:  08/20/23: Not walking for exercise Goal status: ONGOING  6.  Pt will be able to report LE cramping as min and occasional Baseline: severe, often  08/20/23:Happens in the morning and with prolonged standing/walking Goal status: ONGOING   PLAN:  PT FREQUENCY: 1 per week  PT DURATION: 6 weeks  PLANNED INTERVENTIONS: 97164- PT Re-evaluation, 97110-Therapeutic exercises, 97530- Therapeutic activity, 97112- Neuromuscular re-education, 97535- Self Care, 16109- Manual therapy, 97033- Ionotophoresis 4mg /ml Dexamethasone, Patient/Family education, Balance training, Taping, Dry Needling, Joint mobilization, Cryotherapy, and Moist heat  PLAN FOR NEXT SESSION: Check HEP. Begin strength.   Kamee Bobst MS, PT 08/20/23 10:37 AM   PHYSICAL THERAPY DISCHARGE SUMMARY  Visits from Start of Care: 5  Current functional level related to goals /  functional outcomes: See clinical impression and PT goals    Remaining deficits: See clinical impression and PT goals    Education / Equipment: HEP   Patient agrees to discharge. Patient goals were partially met. Patient is being discharged due to  financial reasons.  Dennard Vezina MS, PT 10/01/23 8:21 AM

## 2023-08-20 ENCOUNTER — Ambulatory Visit: Payer: 59 | Attending: Family Medicine

## 2023-08-20 DIAGNOSIS — M25551 Pain in right hip: Secondary | ICD-10-CM | POA: Diagnosis present

## 2023-08-20 DIAGNOSIS — M25552 Pain in left hip: Secondary | ICD-10-CM | POA: Insufficient documentation

## 2023-08-20 DIAGNOSIS — R252 Cramp and spasm: Secondary | ICD-10-CM | POA: Diagnosis present

## 2023-08-22 ENCOUNTER — Ambulatory Visit (HOSPITAL_BASED_OUTPATIENT_CLINIC_OR_DEPARTMENT_OTHER)
Admission: RE | Admit: 2023-08-22 | Discharge: 2023-08-22 | Disposition: A | Discharge status: Home / Self Care | Payer: 59 | Source: Ambulatory Visit | Attending: Family Medicine | Admitting: Family Medicine

## 2023-08-22 DIAGNOSIS — M4726 Other spondylosis with radiculopathy, lumbar region: Secondary | ICD-10-CM | POA: Insufficient documentation

## 2023-08-22 DIAGNOSIS — M5116 Intervertebral disc disorders with radiculopathy, lumbar region: Secondary | ICD-10-CM | POA: Insufficient documentation

## 2023-08-22 DIAGNOSIS — M48061 Spinal stenosis, lumbar region without neurogenic claudication: Secondary | ICD-10-CM | POA: Insufficient documentation

## 2023-08-22 DIAGNOSIS — M5416 Radiculopathy, lumbar region: Secondary | ICD-10-CM | POA: Diagnosis present

## 2023-08-23 ENCOUNTER — Emergency Department (HOSPITAL_BASED_OUTPATIENT_CLINIC_OR_DEPARTMENT_OTHER)
Admission: EM | Admit: 2023-08-23 | Discharge: 2023-08-23 | Disposition: A | Discharge status: Home / Self Care | Payer: 59 | Attending: Emergency Medicine | Admitting: Emergency Medicine

## 2023-08-23 ENCOUNTER — Emergency Department (HOSPITAL_BASED_OUTPATIENT_CLINIC_OR_DEPARTMENT_OTHER): Payer: 59 | Admitting: Radiology

## 2023-08-23 ENCOUNTER — Encounter (HOSPITAL_BASED_OUTPATIENT_CLINIC_OR_DEPARTMENT_OTHER): Payer: Self-pay

## 2023-08-23 ENCOUNTER — Other Ambulatory Visit: Payer: Self-pay

## 2023-08-23 DIAGNOSIS — S59912A Unspecified injury of left forearm, initial encounter: Secondary | ICD-10-CM | POA: Diagnosis present

## 2023-08-23 DIAGNOSIS — Z79899 Other long term (current) drug therapy: Secondary | ICD-10-CM | POA: Diagnosis not present

## 2023-08-23 DIAGNOSIS — Z7982 Long term (current) use of aspirin: Secondary | ICD-10-CM | POA: Insufficient documentation

## 2023-08-23 DIAGNOSIS — W01190A Fall on same level from slipping, tripping and stumbling with subsequent striking against furniture, initial encounter: Secondary | ICD-10-CM | POA: Diagnosis not present

## 2023-08-23 DIAGNOSIS — S51812A Laceration without foreign body of left forearm, initial encounter: Secondary | ICD-10-CM | POA: Insufficient documentation

## 2023-08-23 DIAGNOSIS — E119 Type 2 diabetes mellitus without complications: Secondary | ICD-10-CM | POA: Diagnosis not present

## 2023-08-23 DIAGNOSIS — M25511 Pain in right shoulder: Secondary | ICD-10-CM

## 2023-08-23 DIAGNOSIS — I10 Essential (primary) hypertension: Secondary | ICD-10-CM | POA: Diagnosis not present

## 2023-08-23 DIAGNOSIS — W19XXXA Unspecified fall, initial encounter: Secondary | ICD-10-CM

## 2023-08-23 DIAGNOSIS — Z7984 Long term (current) use of oral hypoglycemic drugs: Secondary | ICD-10-CM | POA: Insufficient documentation

## 2023-08-23 MED ORDER — ACETAMINOPHEN 325 MG PO TABS
650.0000 mg | ORAL_TABLET | Freq: Once | ORAL | Status: AC
  Administered 2023-08-23: 650 mg via ORAL
  Filled 2023-08-23: qty 2

## 2023-08-23 MED ORDER — LIDOCAINE-EPINEPHRINE (PF) 2 %-1:200000 IJ SOLN
20.0000 mL | Freq: Once | INTRAMUSCULAR | Status: AC
  Administered 2023-08-23: 20 mL
  Filled 2023-08-23: qty 20

## 2023-08-23 NOTE — ED Triage Notes (Signed)
She states that, while at work today she encountered a small slick object on the floor which cause her foot to "slide and I fell forward". She c/o pain at right shoulder area and also that she has a small wound on her left mid ant. Forearm "must've landed on something sharp". She is ambulatory and in no distress.

## 2023-08-23 NOTE — ED Notes (Signed)
Transport to xray

## 2023-08-23 NOTE — ED Notes (Signed)
Dc instructions reviewed with patient. Patient voiced understanding. Dc with belongings.  °

## 2023-08-23 NOTE — ED Notes (Signed)
Irrigated left forearm wound, red wound base with some yellow tissue.

## 2023-08-23 NOTE — ED Provider Notes (Signed)
[place] EMERGENCY DEPARTMENT AT [facility] Provider Note   CSN: [id number] Arrival date & time: 08/23/23  1519     History  Chief Complaint  Patient presents with   Lindsay Travis is a 62 y.o. female with a history of diabetes mellitus, hypertension, and sickle cell trait who presents the ED today after a fall.  Patient reports that she was at work and slipped on the floor about 2 hours ago.  She states that she landed on her hands and her knees.  She denies hitting her head or loss of consciousness.  She endorses right shoulder pain and has a laceration to the volar aspect of the left forearm.  Patient is able to ambulate independently.  No weakness, numbness, or tingling. Tetanus vaccine is up to date. No additional complaints or concerns at this time.    Home Medications Prior to Admission medications   Medication Sig Start Date End Date Taking? Authorizing Provider  amLODipine (NORVASC) 5 MG tablet Take 1 tablet (5 mg total) by mouth daily. 10/17/22 10/12/23  Georgina Quint, MD  Ascorbic Acid (VITAMIN C) 1000 MG tablet Take 1,000 mg by mouth 2 (two) times a week.    [provider]  aspirin 81 MG EC tablet Take 1 tablet (81 mg total) by mouth daily. 03/20/18   Ihor Austin, NP  blood glucose meter kit and supplies Per insurance preference. Check daily fasting blood glucose. (Dx. E11.9). 03/20/22   Georgina Quint, MD  dapagliflozin propanediol (FARXIGA) 10 MG TABS tablet Take 1 tablet (10 mg total) by mouth daily. 12/24/22 12/19/23  Georgina Quint, MD  famotidine (PEPCID) 20 MG tablet Take 1 tablet (20 mg total) by mouth 2 (two) times daily. 11/05/22   Roemhildt, Lorin T, PA-C  glipiZIDE (GLUCOTROL) 5 MG tablet TAKE 1 TABLET (5 MG TOTAL) BY MOUTH TWICE A DAY BEFORE MEALS 04/04/23   Georgina Quint, MD  glucose blood (ACCU-CHEK GUIDE) test strip Use as instructed 03/27/23   Georgina Quint, MD  Melatonin 10 MG CAPS Take by mouth.     [provider]  Omega-3 Fatty Acids (OMEGA-3 FISH OIL PO) Take 1 capsule by mouth in the morning.    [provider]  oxybutynin (DITROPAN) 5 MG tablet Take 1 tablet (5 mg total) by mouth every 8 (eight) hours as needed for bladder spasms. Patient not taking: Reported on 07/08/2023 06/25/23   Georgina Quint, MD  traMADol (ULTRAM) 50 MG tablet Take 1 tablet (50 mg total) by mouth every 6 (six) hours as needed (pain). Patient not taking: Reported on 07/08/2023 10/03/22   Zenia Resides, MD      Allergies    Lisinopril, Losartan, and Aspirin    Review of Systems   Review of Systems  Musculoskeletal:        Right shoulder pain    Physical Exam Updated Vital Signs BP 139/79   Pulse 67   Temp 98 F (36.7 C) (Oral)   Resp 18   LMP 09/10/2011   SpO2 99%  Physical Exam Vitals and nursing note reviewed.  Constitutional:      General: She is not in acute distress.    Appearance: Normal appearance.  HENT:     Head: Normocephalic and atraumatic.     Mouth/Throat:     Mouth: Mucous membranes are moist.  Eyes:     Conjunctiva/sclera: Conjunctivae normal.     Pupils: Pupils are equal, round, and reactive  to light.  Cardiovascular:     Rate and Rhythm: Normal rate and regular rhythm.     Pulses: Normal pulses.     Heart sounds: Normal heart sounds.  Pulmonary:     Effort: Pulmonary effort is normal.     Breath sounds: Normal breath sounds.  Abdominal:     Palpations: Abdomen is soft.     Tenderness: There is no abdominal tenderness.  Musculoskeletal:        General: Tenderness present. Normal range of motion.     Cervical back: Normal range of motion. No tenderness.     Comments: Tenderness to palpation of right shoulder without swelling or deformity. ROM, strength, and sensation intact of upper and lower extremities bilaterally.  Skin:    General: Skin is warm and dry.     Findings: No rash.     Comments: Laceration present at volar aspect of left  forearm  Neurological:     General: No focal deficit present.     Mental Status: She is alert.     Sensory: No sensory deficit.     Motor: No weakness.  Psychiatric:        Mood and Affect: Mood normal.        Behavior: Behavior normal.    ED Results / Procedures / Treatments   Labs (all labs ordered are listed, but only abnormal results are displayed) Labs Reviewed - No data to display  EKG None  Radiology DG Shoulder Right Result Date: 08/23/2023 CLINICAL DATA:  Larey Seat today.  Right shoulder pain. EXAM: RIGHT SHOULDER - 2+ VIEW COMPARISON:  None Available. FINDINGS: The glenohumeral and AC joints are maintained. No acute fracture is identified. The visualized right ribs are intact and the visualized right lung is clear. IMPRESSION: No acute bony findings. Electronically Signed   By: Rudie Meyer M.D.   On: 08/23/2023 18:12    Procedures .Laceration Repair  Date/Time: 08/23/2023 5:20 PM  Performed by: Maxwell Marion, PA-C Authorized by: Maxwell Marion, PA-C   Consent:    Consent obtained:  Verbal   Consent given by:  Patient   Risks discussed:  Infection and pain Universal protocol:    Patient identity confirmed:  Verbally with patient Anesthesia:    Anesthesia method:  Local infiltration   Local anesthetic:  Lidocaine 1% WITH epi Laceration details:    Location:  Shoulder/arm   Shoulder/arm location:  L lower arm   Length (cm):  6 Exploration:    Hemostasis achieved with:  Epinephrine Treatment:    Area cleansed with:  Saline   Amount of cleaning:  Standard   Irrigation solution:  Sterile saline   Irrigation method:  Pressure wash Skin repair:    Repair method:  Sutures   Suture size:  5-0   Suture material:  Prolene   Suture technique:  Simple interrupted   Number of sutures:  6 Approximation:    Approximation:  Close Repair type:    Repair type:  Simple Post-procedure details:    Dressing:  Antibiotic ointment and non-adherent dressing   Procedure  completion:  Tolerated well, no immediate complications     Medications Ordered in ED Medications  acetaminophen (TYLENOL) tablet 650 mg (650 mg Oral Given 08/23/23 1616)  lidocaine-EPINEPHrine (XYLOCAINE W/EPI) 2 %-1:200000 (PF) injection 20 mL (20 mLs Infiltration Given by Other 08/23/23 1621)    ED Course/ Medical Decision Making/ A&P  Medical Decision Making Amount and/or Complexity of Data Reviewed Radiology: ordered.  Risk OTC drugs. Prescription drug management.   This patient presents to the ED for concern of fall, this involves an extensive number of treatment options, and is a complaint that carries with it a high risk of complications and morbidity.   Differential diagnosis includes: fracture, dislocation, contusion, muscle strain, abrasion, laceration, etc.   Comorbidities  See HPI above   Additional History  Additional history obtained from prior records.   Imaging Studies  I ordered imaging studies including right shoulder x-ray  I independently visualized and interpreted imaging which showed:  No acute fracture identified.  No acute bony findings.  Joint spaces maintained. I agree with the radiologist interpretation   Problem List / ED Course / Critical Interventions / Medication Management  Right shoulder pain and left forearm laceration after fall I ordered medications including: Tylenol for pain  Lidocaine for laceration repair  analgesic  Reevaluation of the patient after these medicines showed that the patient improved I have reviewed the patients home medicines and have made adjustments as needed   Social Determinants of Health  Occupation   Test / Admission - Considered  Discussed findings with patient and family member at bedside. She is hemodynamically stable and safe for discharge home. Return precautions given.       Final Clinical Impression(s) / ED Diagnoses Final diagnoses:  Forearm  laceration, left, initial encounter  Acute pain of right shoulder  Fall, initial encounter    Rx / DC Orders ED Discharge Orders     None         Maxwell Marion, PA-C 08/23/23 2150    Loetta Rough, MD 08/24/23 [phone]

## 2023-08-23 NOTE — Discharge Instructions (Addendum)
Leave the dressing on your laceration for the next 24 hours. After that, you can leave the wound open to air. After the first 24 hours, you can begin to clean the wound site with soap and water to prevent crusting over the suture knots.  Your sutures need to be removed in 7 days. That can be done at your PCP's office, any urgent care, or ED.  Do not go into pools, lakes, or oceans until the sutures are removed in order to prevent infection.   The imaging of your right arm does not show any breaks or dislocations. You can ice your arm as needed and take Tylenol every 6-8 hours as needed.  Follow up with your PCP in 5-7 days for reevaluation of your symptoms.   Get help right away if: You have very bad swelling around the wound. Your pain suddenly gets worse and is very bad. You have painful lumps near the wound or on skin anywhere on your body. You have a red streak going away from your wound.

## 2023-08-28 ENCOUNTER — Encounter: Payer: Self-pay | Admitting: Emergency Medicine

## 2023-08-28 ENCOUNTER — Ambulatory Visit (INDEPENDENT_AMBULATORY_CARE_PROVIDER_SITE_OTHER): Payer: 59 | Admitting: Emergency Medicine

## 2023-08-28 VITALS — BP 148/90 | HR 67 | Temp 98.4°F | Ht 66.0 in | Wt 227.6 lb

## 2023-08-28 DIAGNOSIS — M25552 Pain in left hip: Secondary | ICD-10-CM | POA: Diagnosis not present

## 2023-08-28 DIAGNOSIS — M25551 Pain in right hip: Secondary | ICD-10-CM

## 2023-08-28 MED ORDER — TRAMADOL HCL 50 MG PO TABS: 50.0000 mg | ORAL_TABLET | Freq: Three times a day (TID) | ORAL | 1 refills | Status: AC | PRN

## 2023-08-28 NOTE — Progress Notes (Signed)
Lindsay Travis 63 y.o.   Chief Complaint  Patient presents with   Follow-up    Hospital f/u. Patient states she still feels bad and still have pain at her hips. She states even with injections at her hips the pain still present.     HISTORY OF PRESENT ILLNESS: This is a 62 y.o. female here for follow-up of emergency department visit on 08/23/2023 when she presented after a fall and sustained multiple contusion and laceration to left arm. Feeling better but still having some pain to her hips No other complaint or medical concerns today.   HPI   Prior to Admission medications   Medication Sig Start Date End Date Taking? Authorizing Provider  amLODipine (NORVASC) 5 MG tablet Take 1 tablet (5 mg total) by mouth daily. 10/17/22 10/12/23 Yes Aemon Koeller, Eilleen Kempf, MD  Ascorbic Acid (VITAMIN C) 1000 MG tablet Take 1,000 mg by mouth 2 (two) times a week.   Yes [provider]  aspirin 81 MG EC tablet Take 1 tablet (81 mg total) by mouth daily. 03/20/18  Yes Ihor Austin, NP  blood glucose meter kit and supplies Per insurance preference. Check daily fasting blood glucose. (Dx. E11.9). 03/20/22  Yes Galilea Quito, Eilleen Kempf, MD  dapagliflozin propanediol (FARXIGA) 10 MG TABS tablet Take 1 tablet (10 mg total) by mouth daily. 12/24/22 12/19/23 Yes Shavonn Convey, Eilleen Kempf, MD  famotidine (PEPCID) 20 MG tablet Take 1 tablet (20 mg total) by mouth 2 (two) times daily. 11/05/22  Yes Roemhildt, Lorin T, PA-C  glipiZIDE (GLUCOTROL) 5 MG tablet TAKE 1 TABLET (5 MG TOTAL) BY MOUTH TWICE A DAY BEFORE MEALS 04/04/23  Yes Imoni Kohen, Eilleen Kempf, MD  glucose blood (ACCU-CHEK GUIDE) test strip Use as instructed 03/27/23  Yes Alverna Fawley, Eilleen Kempf, MD  Melatonin 10 MG CAPS Take by mouth.   Yes [provider]  Omega-3 Fatty Acids (OMEGA-3 FISH OIL PO) Take 1 capsule by mouth in the morning.   Yes [provider]  oxybutynin (DITROPAN) 5 MG tablet Take 1 tablet (5 mg total) by mouth every 8 (eight)  hours as needed for bladder spasms. Patient not taking: Reported on 08/28/2023 06/25/23   Georgina Quint, MD  traMADol (ULTRAM) 50 MG tablet Take 1 tablet (50 mg total) by mouth every 6 (six) hours as needed (pain). Patient not taking: Reported on 08/28/2023 10/03/22   Zenia Resides, MD    Allergies  Allergen Reactions   Lisinopril Other (See Comments)    Did not feel good   Losartan Other (See Comments)    headache   Aspirin Other (See Comments)    Abdominal pain only 325 mg     Patient Active Problem List   Diagnosis Date Noted   Overactive bladder 06/25/2023   Dyslipidemia associated with type 2 diabetes mellitus (HCC) 06/19/2022   Statin myopathy 08/01/2021   Obesity, diabetes, and hypertension syndrome (HCC) 01/09/2021   Morbid obesity (HCC) 09/18/2019   Dyslipidemia    Type 2 diabetes mellitus with hyperglycemia, without long-term current use of insulin (HCC) 08/07/2015   Varicose veins of bilateral lower extremities with other complications 02/24/2012   Uterine leiomyoma 04/18/2011   Hypertension associated with diabetes (HCC) 04/18/2011    Past Medical History:  Diagnosis Date   Anemia    Arthritis    bilateral kneed and lower legs   Clotting disorder (HCC)    Diabetes mellitus    recent high blood sugar and has RX but not taking  med -b/c she doesn't  think she  is diabetic, just had lots of sugar in her diet when dx'd.-on meds   Hx of adenomatous polyp of colon 07/2021   Repeat colonoscopy 2029   Hypertension    on meds   Shortness of breath    on exertion- has low hgb   Sickle cell trait [facility])     Past Surgical History:  Procedure Laterality Date   ABDOMINAL HYSTERECTOMY  09/11/2011   Procedure: HYSTERECTOMY ABDOMINAL;  Surgeon: Kathreen Cosier, MD;  Location: [facility] OR[facility];  Service: Gynecology;  Laterality: N/A;   COLONOSCOPY WITH PROPOFOL N/A 07/16/2021   Procedure: COLONOSCOPY WITH PROPOFOL;  Surgeon: Iva Boop, MD;  Location: [facility] ENDOSCOPY;   Service: Endoscopy;  Laterality: N/A;   POLYPECTOMY  07/16/2021   Procedure: POLYPECTOMY;  Surgeon: Iva Boop, MD;  Location: [facility] ENDOSCOPY;  Service: Endoscopy;;    Social History   Socioeconomic History   Marital status: Divorced    Spouse name: Not on file   Number of children: Not on file   Years of education: Not on file   Highest education level: Not on file  Occupational History   Not on file  Tobacco Use   Smoking status: Never   Smokeless tobacco: Never  Vaping Use   Vaping status: Never Used  Substance and Sexual Activity   Alcohol use: No   Drug use: No   Sexual activity: Never  Other Topics Concern   Not on file  Social History Narrative   Not on file   Social Drivers of Health   Financial Resource Strain: Not on file  Food Insecurity: Not on file  Transportation Needs: Not on file  Physical Activity: Not on file  Stress: Not on file  Social Connections: Not on file  Intimate Partner Violence: Not on file    Family History  Problem Relation Age of Onset   Hypertension Other    Colon polyps Neg Hx    Colon cancer Neg Hx    Heart disease Neg Hx    Esophageal cancer Neg Hx    Rectal cancer Neg Hx    Stomach cancer Neg Hx      Review of Systems  Constitutional: Negative.  Negative for chills and fever.  HENT: Negative.  Negative for congestion and sore throat.   Respiratory: Negative.  Negative for cough and shortness of breath.   Cardiovascular: Negative.  Negative for chest pain and palpitations.  Gastrointestinal:  Negative for abdominal pain, diarrhea, nausea and vomiting.  Genitourinary: Negative.   Musculoskeletal:  Positive for back pain.  Skin: Negative.  Negative for rash.  Neurological: Negative.  Negative for dizziness and headaches.  All other systems reviewed and are negative.   Vitals:   08/28/23 1440  BP: (!) 148/90  Pulse: 67  Temp: 98.4 F (36.9 C)  SpO2: 96%    Physical Exam Vitals reviewed.  Constitutional:       Appearance: Normal appearance.  HENT:     Head: Normocephalic.  Eyes:     Extraocular Movements: Extraocular movements intact.  Cardiovascular:     Rate and Rhythm: Normal rate.  Pulmonary:     Effort: Pulmonary effort is normal.  Abdominal:     Palpations: Abdomen is soft.     Tenderness: There is no abdominal tenderness.  Musculoskeletal:     Comments: Mild bilateral hip tenderness  Skin:    General: Skin is warm and dry.     Capillary Refill: Capillary refill takes less than 2  seconds.  Neurological:     Mental Status: She is alert and oriented to person, place, and time.  Psychiatric:        Mood and Affect: Mood normal.        Behavior: Behavior normal.      ASSESSMENT & PLAN: A total of 32 minutes was spent with the patient and counseling/coordination of care regarding preparing for this visit, review of most recent office visit notes, review of chronic medical conditions under management, review of all medications, pain management, prognosis, documentation, need for follow-up  Problem List Items Addressed This Visit       Other   Bilateral hip pain - Primary   Stable.  Exacerbated by recent fall. Pain management discussed. May take Tylenol for mild to moderate pain and tramadol for moderate to severe pain May need to follow-up with orthopedist      Patient Instructions  Hip Pain The hip is the joint between the upper legs and the lower pelvis. The bones, cartilage, tendons, and muscles of your hip joint support your body and allow you to move around. Hip pain can range from a minor ache to severe pain in one or both of your hips. The pain may be felt on the inside of the hip joint near the groin, or on the outside near the buttocks and upper thigh. You may also have swelling or stiffness in your hip area. Follow these instructions at home: Managing pain, stiffness, and swelling     If told, put ice on the painful area. Put ice in a plastic bag. Place a  towel between your skin and the bag. Leave the ice on for 20 minutes, 2-3 times a day. If told, apply heat to the affected area as often as told by your health care provider. Use the heat source that your provider recommends, such as a moist heat pack or a heating pad. Place a towel between your skin and the heat source. Leave the heat on for 20-30 minutes. If your skin turns bright red, remove the ice or heat right away to prevent skin damage. The risk of damage is higher if you cannot feel pain, heat, or cold. Activity Do exercises as told by your provider. Avoid activities that cause pain. General instructions  Take over-the-counter and prescription medicines only as told by your provider. Keep a journal of your symptoms. Write down: How often you have hip pain. The location of your pain. What the pain feels like. What makes the pain worse. Sleep with a pillow between your legs on your most comfortable side. Keep all follow-up visits. Your provider will monitor your pain and activity. Contact a health care provider if: You cannot put weight on your leg. Your pain or swelling gets worse after a week. It gets harder to walk. You have a fever. Get help right away if: You fall. You have a sudden increase in pain and swelling in your hip. Your hip is red or swollen or very tender to touch. This information is not intended to replace advice given to you by your health care provider. Make sure you discuss any questions you have with your health care provider. Document Revised: 04/30/2022 Document Reviewed: 04/30/2022 Elsevier Patient Education  2024 Elsevier Inc.    Edwina Barth, MD [facility]

## 2023-08-28 NOTE — Patient Instructions (Signed)
Hip Pain The hip is the joint between the upper legs and the lower pelvis. The bones, cartilage, tendons, and muscles of your hip joint support your body and allow you to move around. Hip pain can range from a minor ache to severe pain in one or both of your hips. The pain may be felt on the inside of the hip joint near the groin, or on the outside near the buttocks and upper thigh. You may also have swelling or stiffness in your hip area. Follow these instructions at home: Managing pain, stiffness, and swelling     If told, put ice on the painful area. Put ice in a plastic bag. Place a towel between your skin and the bag. Leave the ice on for 20 minutes, 2-3 times a day. If told, apply heat to the affected area as often as told by your health care provider. Use the heat source that your provider recommends, such as a moist heat pack or a heating pad. Place a towel between your skin and the heat source. Leave the heat on for 20-30 minutes. If your skin turns bright red, remove the ice or heat right away to prevent skin damage. The risk of damage is higher if you cannot feel pain, heat, or cold. Activity Do exercises as told by your provider. Avoid activities that cause pain. General instructions  Take over-the-counter and prescription medicines only as told by your provider. Keep a journal of your symptoms. Write down: How often you have hip pain. The location of your pain. What the pain feels like. What makes the pain worse. Sleep with a pillow between your legs on your most comfortable side. Keep all follow-up visits. Your provider will monitor your pain and activity. Contact a health care provider if: You cannot put weight on your leg. Your pain or swelling gets worse after a week. It gets harder to walk. You have a fever. Get help right away if: You fall. You have a sudden increase in pain and swelling in your hip. Your hip is red or swollen or very tender to touch. This  information is not intended to replace advice given to you by your health care provider. Make sure you discuss any questions you have with your health care provider. Document Revised: 04/30/2022 Document Reviewed: 04/30/2022 Elsevier Patient Education  2024 Elsevier Inc.  

## 2023-08-28 NOTE — Assessment & Plan Note (Signed)
Stable.  Exacerbated by recent fall. Pain management discussed. May take Tylenol for mild to moderate pain and tramadol for moderate to severe pain May need to follow-up with orthopedist

## 2023-09-08 NOTE — Progress Notes (Signed)
Low back MRI shows areas of arthritis and bulging disc that look like they are pressing on nerves causing your leg pain.  Recommend return to clinic to go over the results in full detail and discussed treatment plan and options including potential back injections.

## 2023-09-16 ENCOUNTER — Telehealth: Payer: Self-pay

## 2023-09-16 ENCOUNTER — Ambulatory Visit: Payer: BLUE CROSS/BLUE SHIELD | Attending: Family Medicine

## 2023-09-16 NOTE — Telephone Encounter (Signed)
 Spoke to pt re: no show visit. Pt stated she missed because she was concerned about her insurance coverage. Pt was advised she has 1 more appt prior to needing to have ins auth. Pt was reminded of her up coming appt on 09/23/23.

## 2023-09-18 ENCOUNTER — Encounter: Payer: Self-pay | Admitting: Family Medicine

## 2023-09-18 ENCOUNTER — Ambulatory Visit (INDEPENDENT_AMBULATORY_CARE_PROVIDER_SITE_OTHER): Payer: Self-pay | Admitting: Family Medicine

## 2023-09-18 VITALS — BP 138/84 | HR 80 | Ht 66.0 in | Wt 229.0 lb

## 2023-09-18 DIAGNOSIS — M5441 Lumbago with sciatica, right side: Secondary | ICD-10-CM

## 2023-09-18 DIAGNOSIS — M5442 Lumbago with sciatica, left side: Secondary | ICD-10-CM

## 2023-09-18 DIAGNOSIS — G8929 Other chronic pain: Secondary | ICD-10-CM

## 2023-09-18 NOTE — Progress Notes (Signed)
 I, Leotis Batter, CMA acting as a scribe for Artist Lloyd, MD.  Lindsay Travis is a 63 y.o. female who presents to [facility] at [facility] today for f/u lumbar radiculopathy w/ MRI review. Pt was last seen by Dr. Lloyd on 07/28/23 and a lumbar MRI was ordered.  Today, pt reports no change in sx since last visit. Continued to have cramping and pain in the gluteal region, hips, and legs. Denies groin pain. Review MRI today.    Dx imaging: 08/22/23 L-spine MRI 06/16/23 L-spine XR             11/07/21 Bilat hip/pelvis  Pertinent review of systems: No fevers or chills  Relevant historical information: Hypertension and diabetes   Exam:  BP 138/84   Pulse 80   Ht 5' 6 (1.676 m)   Wt 229 lb (103.9 kg)   LMP 09/10/2011   SpO2 97%   BMI 36.96 kg/m  General: Well Developed, well nourished, and in no acute distress.   MSK: L-spine nontender palpation decreased lumbar motion lower lower strength is intact.    Lab and Radiology Results  EXAM: MRI LUMBAR SPINE WITHOUT CONTRAST   TECHNIQUE: Multiplanar, multisequence MR imaging of the lumbar spine was performed. No intravenous contrast was administered.   COMPARISON:  Radiography 06/16/2023   FINDINGS: Segmentation:  5 lumbar type vertebral bodies.   Alignment: Normal except for 1-2 mm of anterolisthesis at L4-5 and L5-S1.   Vertebrae: No fracture or focal bone lesion. No edematous endplate marrow changes. Mild edema associated with the lower lumbar facet joints.   Conus medullaris and cauda equina: Conus extends to the L1-2 level. Conus and cauda equina appear normal.   Paraspinal and other soft tissues: Negative   Disc levels:   Mild non-compressive disc bulges at L1-2 and above.   L2-3: Moderate bulging of the disc with shallow protrusion in the left posterolateral direction. This indents the thecal sac. Mild narrowing of the left lateral recess but no likely neural compression.   L3-4:  Moderate bulging of the disc with a shallow protrusion in the left foraminal to extraforaminal region. Facet and ligamentous hypertrophy. Stenosis of the lateral recesses left more than right. Some potential for neural compression, particularly on the left. Foraminal encroachment on the left could affect the exiting L3 nerve.   L4-5: Bilateral facet arthropathy with 1-2 mm of anterolisthesis. Bulging of the disc. No central canal stenosis. Mild narrowing of the lateral recesses. Bilateral foraminal stenosis that could affect either L4 nerve. The facet arthropathy could be painful.   L5-S1: Bilateral facet osteoarthritis with 1-2 mm of anterolisthesis. Mild bulging of the disc. No central canal or subarticular lateral recess stenosis. Mild foraminal narrowing on the right and moderate foraminal narrowing on the left. Some potential the left L5 nerve could be affected. The facet arthritis could be painful.   IMPRESSION: 1. L2-3: Moderate bulging of the disc with shallow protrusion in the left posterolateral direction. Mild narrowing of the left lateral recess but no likely neural compression. 2. L3-4: Moderate bulging of the disc with a shallow protrusion in the left foraminal to extraforaminal region. Facet and ligamentous hypertrophy. Stenosis of the lateral recesses left more than right. Some potential for neural compression, particularly on the left. Foraminal encroachment on the left could affect the exiting L3 nerve. 3. L4-5: Bilateral facet arthropathy with 1-2 mm of anterolisthesis. Bulging of the disc. Mild narrowing of the lateral recesses. Bilateral foraminal stenosis that could affect either L4  nerve. The facet arthropathy could be painful. 4. L5-S1: Bilateral facet osteoarthritis with 1-2 mm of anterolisthesis. Mild bulging of the disc. Mild foraminal narrowing on the right and moderate foraminal narrowing on the left. Some potential the left L5 nerve could be affected.  The facet arthritis could be painful.     Electronically Signed   By: Oneil Officer M.D.   On: 09/04/2023 14:10 I, Artist Lloyd, personally (independently) visualized and performed the interpretation of the images attached in this note.      Assessment and Plan: 63 y.o. female with chronic low back pain with pain radiating down her legs.  She has multifactorial back pain and leg pain primarily due to lumbar radiculopathy and facet arthritis.  She has multiple locations that could benefit from injection but her primary pain generators are most consistent with bilateral L4.  Plan for trial epidural steroid injection.  If this is not sufficient consider facet injection.  Reviewed MRI and treatment plan and options.  She will let me know how she feels after the upcoming epidural steroid injection.   PDMP not reviewed this encounter. Orders Placed This Encounter  Procedures   DG INJECT DIAG/THERA/INC NEEDLE/CATH/PLC EPI/LUMB/SAC W/IMG    Level and technique per radiology    Standing Status:   Future    Expiration Date:   10/19/2023    Reason for Exam (SYMPTOM  OR DIAGNOSIS REQUIRED):   Low back pain    Preferred Imaging Location?:   [facility]   No orders of the defined types were placed in this encounter.    Discussed warning signs or symptoms. Please see discharge instructions. Patient expresses understanding.   The above documentation has been reviewed and is accurate and complete Artist Lloyd, M.D. Total encounter time 30 minutes including face-to-face time with the patient and, reviewing past medical record, and charting on the date of service.

## 2023-09-18 NOTE — Patient Instructions (Addendum)
 Thank you for coming in today.  Please call [facility] ([facility]) at [phone] to schedule your spine injection.    Check back as needed

## 2023-09-23 ENCOUNTER — Ambulatory Visit: Payer: BLUE CROSS/BLUE SHIELD

## 2023-09-25 ENCOUNTER — Ambulatory Visit: Payer: Self-pay | Admitting: Emergency Medicine

## 2023-09-30 ENCOUNTER — Ambulatory Visit: Payer: BLUE CROSS/BLUE SHIELD

## 2023-10-13 ENCOUNTER — Ambulatory Visit (INDEPENDENT_AMBULATORY_CARE_PROVIDER_SITE_OTHER): Payer: BLUE CROSS/BLUE SHIELD | Admitting: Family Medicine

## 2023-10-13 VITALS — BP 138/88 | Ht 65.0 in | Wt 220.0 lb

## 2023-10-13 DIAGNOSIS — M5416 Radiculopathy, lumbar region: Secondary | ICD-10-CM

## 2023-10-13 MED ORDER — TRAMADOL HCL 50 MG PO TABS: 50.0000 mg | ORAL_TABLET | Freq: Four times a day (QID) | ORAL | 0 refills | Status: AC | PRN

## 2023-10-13 NOTE — Telephone Encounter (Signed)
Copied from CRM [id number]. Topic: Clinical - Medical Advice >> Oct 13, 2023  9:46 AM Lindsay Travis wrote: Reason for CRM: Patient states she still have pain in back and leg, wants to speak with nurse regarding medications allergies that was injected into her legs, is epidural good for her? Please advise and call back [phone].

## 2023-10-13 NOTE — Patient Instructions (Signed)
I would recommend you go ahead with the injection Dr. Denyse Amass referred you for. Take tylenol 500mg  1-2 tabs three times a day as needed for pain. Tramadol as needed for severe pain. This pain is coming from irritated nerves in your back into the glute area and down your legs.

## 2023-10-14 ENCOUNTER — Telehealth: Payer: Self-pay | Admitting: Family Medicine

## 2023-10-14 ENCOUNTER — Encounter: Payer: Self-pay | Admitting: Family Medicine

## 2023-10-14 DIAGNOSIS — G8929 Other chronic pain: Secondary | ICD-10-CM

## 2023-10-14 DIAGNOSIS — M5416 Radiculopathy, lumbar region: Secondary | ICD-10-CM

## 2023-10-14 NOTE — Progress Notes (Signed)
PCP: Georgina Quint, MD  Subjective:   HPI: Patient is a 63 y.o. female here for bilateral leg pain.  Patient reports for about 3 months she has had pain in the posterior aspect of her hips, buttocks going down her legs bilaterally to about the knee. She has been seen by Dr. Denyse Amass since October and did physical therapy, home exercises, had bilateral trochanteric bursa injections and then had a lumbar spine MRI after the injections did not help much which showed pathology at multiple levels with potential for nerve impingement of L3-L5 nerve roots especially on the left side. She has been set up to trial epidural injections though wanted to get a second opinion before proceeding with these. No bowel or bladder dysfunction.  Past Medical History:  Diagnosis Date   Anemia    Arthritis    bilateral kneed and lower legs   Clotting disorder (HCC)    Diabetes mellitus    recent high blood sugar and has RX but not taking  med -b/c she doesn't think she  is diabetic, just had lots of sugar in her diet when dx'd.-on meds   Hx of adenomatous polyp of colon 07/2021   Repeat colonoscopy 2029   Hypertension    on meds   Shortness of breath    on exertion- has low hgb   Sickle cell trait (HCC)     Current Outpatient Medications on File Prior to Visit  Medication Sig Dispense Refill   amLODipine (NORVASC) 5 MG tablet Take 1 tablet (5 mg total) by mouth daily. 90 tablet 3   Ascorbic Acid (VITAMIN C) 1000 MG tablet Take 1,000 mg by mouth 2 (two) times a week.     aspirin 81 MG EC tablet Take 1 tablet (81 mg total) by mouth daily. 90 tablet 3   blood glucose meter kit and supplies Per insurance preference. Check daily fasting blood glucose. (Dx. E11.9). 1 each 11   dapagliflozin propanediol (FARXIGA) 10 MG TABS tablet Take 1 tablet (10 mg total) by mouth daily. 90 tablet 3   famotidine (PEPCID) 20 MG tablet Take 1 tablet (20 mg total) by mouth 2 (two) times daily. 30 tablet 0   glipiZIDE  (GLUCOTROL) 5 MG tablet TAKE 1 TABLET (5 MG TOTAL) BY MOUTH TWICE A DAY BEFORE MEALS 180 tablet 1   glucose blood (ACCU-CHEK GUIDE) test strip Use as instructed 100 each 12   Melatonin 10 MG CAPS Take by mouth.     Omega-3 Fatty Acids (OMEGA-3 FISH OIL PO) Take 1 capsule by mouth in the morning.     oxybutynin (DITROPAN) 5 MG tablet Take 1 tablet (5 mg total) by mouth every 8 (eight) hours as needed for bladder spasms. 30 tablet 3   No current facility-administered medications on file prior to visit.    Past Surgical History:  Procedure Laterality Date   ABDOMINAL HYSTERECTOMY  09/11/2011   Procedure: HYSTERECTOMY ABDOMINAL;  Surgeon: Kathreen Cosier, MD;  Location: [facility];  Service: Gynecology;  Laterality: N/A;   COLONOSCOPY WITH PROPOFOL N/A 07/16/2021   Procedure: COLONOSCOPY WITH PROPOFOL;  Surgeon: Iva Boop, MD;  Location: [facility];  Service: Endoscopy;  Laterality: N/A;   POLYPECTOMY  07/16/2021   Procedure: POLYPECTOMY;  Surgeon: Iva Boop, MD;  Location: [facility];  Service: Endoscopy;;    Allergies  Allergen Reactions   Lisinopril Other (See Comments)    Did not feel good   Losartan Other (See Comments)    headache  Aspirin Other (See Comments)    Abdominal pain only 325 mg     BP 138/88   Ht 5\' 5"  (1.651 m)   Wt 220 lb (99.8 kg)   LMP 09/10/2011   BMI 36.61 kg/m       No data to display              No data to display              Objective:  Physical Exam:  Gen: NAD, comfortable in exam room  Back: No gross deformity, scoliosis. TTP bilateral low paraspinal lumbar regions, also inferolateral to PSIS.  No midline or bony TTP. FROM with pain on extension. Strength LEs 5/5 all muscle groups.   Trace MSRs in patellar and achilles tendons, equal bilaterally. Negative SLRs. Sensation intact to light touch bilaterally. Negative faber, fadir bilaterally.   Assessment & Plan:  1.  Low back pain with radiation into bilateral  lower extremities: MRI is reassuring that she does not have any spinal stenosis though there is narrowing from the L3 through the S1 levels that could account for her pain, especially at the L4-5 level given that her symptoms are bilateral.  Given that she has tried formal physical therapy, home exercises, bilateral trochanteric bursa injections and is not improving, I recommended she go ahead with the epidural injection as scheduled.  Tylenol, tramadol as needed for pain.  Advised to follow-up about a week after this with Dr. Denyse Amass about how she is doing.

## 2023-10-14 NOTE — Telephone Encounter (Signed)
 I called Lindsay Travis.  imaging is not within network for back injections.  Looks like [facility] will be.  Referral placed for referral to pain management at [facility] for back injection planning.  This is the message I received: Good Morning Dr Lindsay Travis , This Patients insurance is OON with our facility , she will need to [facility] at a [facility], or [facility]. The patient is aware . Can someone from your office send the order to one of their imaging facilities please .

## 2023-10-17 ENCOUNTER — Inpatient Hospital Stay: Admission: RE | Admit: 2023-10-17 | Payer: Self-pay | Source: Ambulatory Visit

## 2023-10-17 NOTE — Telephone Encounter (Signed)
 Patient called back to follow up. She has not heard from anyone to schedule. Can you confirm where this order was sent?

## 2023-10-20 ENCOUNTER — Telehealth: Payer: Self-pay

## 2023-10-20 NOTE — Telephone Encounter (Signed)
 error

## 2023-10-20 NOTE — Telephone Encounter (Addendum)
 Referral faxed to [facility]  via FAX machine.   Called and spoke with patient, she thinks that she may have an appointment with [facility] on [facility] in [place] this coming Friday.   [facility] Address [phone] [place], [place] [address]  Called [phone] and left VM to call with appointment information.

## 2023-10-21 NOTE — Telephone Encounter (Signed)
   Called pt and advised of appt info above. Pt verbalized understanding.

## 2023-10-21 NOTE — Telephone Encounter (Signed)
Called [facility] and left VM to call the office back in regards to pt referral.

## 2023-10-26 ENCOUNTER — Other Ambulatory Visit: Payer: Self-pay | Admitting: Emergency Medicine

## 2023-10-26 DIAGNOSIS — E1159 Type 2 diabetes mellitus with other circulatory complications: Secondary | ICD-10-CM

## 2023-11-10 ENCOUNTER — Other Ambulatory Visit: Payer: Self-pay | Admitting: Emergency Medicine

## 2023-11-10 DIAGNOSIS — E1159 Type 2 diabetes mellitus with other circulatory complications: Secondary | ICD-10-CM

## 2023-11-13 ENCOUNTER — Telehealth: Payer: Self-pay | Admitting: *Deleted

## 2023-11-13 DIAGNOSIS — M5416 Radiculopathy, lumbar region: Secondary | ICD-10-CM

## 2023-11-13 NOTE — Telephone Encounter (Signed)
 Pt called stating she got her back injections on 2/27 and on Sunday the pain came back. She would like to know what Dr. Denyse Amass recommends.

## 2023-11-14 NOTE — Telephone Encounter (Signed)
 If your primary pain is radiating down your legs we could asked the pain management doctors to do an injection using a different technique that might work differently or better.  Would you like me to try to get that set up?  If so we can place an order for evaluation for interlaminar injection.

## 2023-11-14 NOTE — Telephone Encounter (Signed)
 Called pt and left VM to call the office.

## 2023-11-14 NOTE — Telephone Encounter (Signed)
 Called and spoke with patient, she states that she had back injection 2/27 with [facility]. She got short-term improvement of sx but B LE sx have already returned.

## 2023-11-14 NOTE — Telephone Encounter (Signed)
 Next step is facet back injections.  Where your pain located currently?  There are multiple locations that I could arrange to have injected.  If your pain is on both sides of the base of your spine were going to do facet injections at the bottom of your spine.  Is that correct?

## 2023-11-17 NOTE — Telephone Encounter (Signed)
 Called and spoke with patient. She is agreeable to proceeding with interlaminar back injection. She would like to have this done at [facility], where previous injection was done.   Order pending. Forwarding to Dr. Denyse Amass to confirm correct order has been placed.

## 2023-11-17 NOTE — Addendum Note (Signed)
 Addended by: Dierdre Searles on: 11/17/2023 07:56 AM   Modules accepted: Orders

## 2023-11-17 NOTE — Telephone Encounter (Signed)
 Order pending   [facility]  [address]  [place], [place] [address]  [phone]

## 2023-11-18 ENCOUNTER — Ambulatory Visit: Payer: Self-pay | Admitting: Emergency Medicine

## 2023-11-18 NOTE — Telephone Encounter (Signed)
 Chief Complaint: leg pain Symptoms: bilateral leg pain Frequency: since oct 2024 Pertinent Negatives: Patient denies chest pain, fever, redness or swelling Disposition: [] ED /[] Urgent Care (no appt availability in office) / [x] Appointment(In office/virtual)/ []  [facility]/ [] Home Care/ [] Refused Recommended Disposition /[]  Mobile Bus/ []  Follow-up with PCP Additional Notes: Patient states that she has been having bilateral leg pain since starting her back injections in Oct 2024 that has gotten progressively worse after each injection. Patient states that she takes Tramadol for the pain but it doesn't really help.   Copied from CRM [id number]. Topic: Clinical - Red Word Triage >> Nov 18, 2023  9:11 AM Adele Barthel wrote: Red Word that prompted transfer to Nurse Triage:   Bilateral leg pain for an extended period of time 8/10 on pain scale. Was sent to [facility] for a back injection in 06/2023 Nerve pain and joint pain has been present since then. Reason for Disposition  [1] MODERATE pain (e.g., interferes with normal activities, limping) AND [2] present > 3 days  Answer Assessment - Initial Assessment Questions 1. ONSET: "When did the pain start?"     Since Oct 2024 2. LOCATION: "Where is the pain located?"      Bilateral legs 3. PAIN: "How bad is the pain?"    (Scale 1-10; or mild, moderate, severe)   -  MILD (1-3): doesn't interfere with normal activities    -  MODERATE (4-7): interferes with normal activities (e.g., work or school) or awakens from sleep, limping    -  SEVERE (8-10): excruciating pain, unable to do any normal activities, unable to walk     8 4. WORK OR EXERCISE: "Has there been any recent work or exercise that involved this part of the body?"      Has back injections done, last one was last Thursday 5. CAUSE: "What do you think is causing the leg pain?"     Back injections 6. OTHER SYMPTOMS: "Do you have any other symptoms?" (e.g., chest pain, back  pain, breathing difficulty, swelling, rash, fever, numbness, weakness)     no  Protocols used: Leg Pain-A-AH

## 2023-11-19 ENCOUNTER — Ambulatory Visit: Admitting: Emergency Medicine

## 2023-11-19 ENCOUNTER — Encounter: Payer: Self-pay | Admitting: Emergency Medicine

## 2023-11-19 VITALS — BP 122/82 | HR 80 | Temp 98.8°F | Ht 65.0 in | Wt 227.0 lb

## 2023-11-19 DIAGNOSIS — M5431 Sciatica, right side: Secondary | ICD-10-CM

## 2023-11-19 DIAGNOSIS — M5432 Sciatica, left side: Secondary | ICD-10-CM | POA: Diagnosis not present

## 2023-11-19 DIAGNOSIS — M4726 Other spondylosis with radiculopathy, lumbar region: Secondary | ICD-10-CM | POA: Diagnosis not present

## 2023-11-19 DIAGNOSIS — M792 Neuralgia and neuritis, unspecified: Secondary | ICD-10-CM | POA: Insufficient documentation

## 2023-11-19 MED ORDER — PREGABALIN 50 MG PO CAPS: 50.0000 mg | ORAL_CAPSULE | Freq: Two times a day (BID) | ORAL | 1 refills | Status: DC

## 2023-11-19 NOTE — Assessment & Plan Note (Signed)
 Significant degenerative lumbar spine and disc disease as seen on MRI done last December Was already evaluated by sports medicine Referred for spinal injections but not helping much Needs evaluation by spinal surgeon Referral placed today.

## 2023-11-19 NOTE — Patient Instructions (Signed)
 Sciatica  Sciatica is pain, weakness, tingling, or loss of feeling (numbness) along the sciatic nerve. The sciatic nerve starts in the lower back and goes down the back of each leg. Sciatica usually affects one side of the body. Sciatica usually goes away on its own or with treatment. Sometimes, sciatica may come back. What are the causes? This condition happens when the sciatic nerve is pinched or has pressure put on it. This may be caused by: A disk in between the bones of the spine bulging out too far (herniated disk). Changes in the spinal disks due to aging. A condition that affects a muscle in the butt. Extra bone growth near the sciatic nerve. A break (fracture) of the area between your hip bones (pelvis). Pregnancy. Tumor. This is rare. What increases the risk? You are more likely to develop this condition if you: Play sports that put pressure or stress on the spine. Have poor strength and ease of movement (flexibility). Have had a back injury or back surgery. Sit for long periods of time. Do activities that involve bending or lifting over and over again. Are very overweight (obese). What are the signs or symptoms? Symptoms can vary from mild to very bad. They may include: Any of these problems in the lower back, leg, hip, or butt: Mild tingling, loss of feeling, or dull aches. A burning feeling. Sharp pains. Loss of feeling in the back of the calf or the sole of the foot. Leg weakness. Very bad back pain that makes it hard to move. These symptoms may get worse when you cough, sneeze, or laugh. They may also get worse when you sit or stand for long periods of time. How is this treated? This condition often gets better without any treatment. However, treatment may include: Changing or cutting back on physical activity when you have pain. Exercising, including strengthening and stretching. Putting ice or heat on the affected area. Shots of medicines to relieve pain and  swelling or to relax your muscles. Surgery. Follow these instructions at home: Medicines Take over-the-counter and prescription medicines only as told by your doctor. Ask your doctor if you should avoid driving or using machines while you are taking your medicine. Managing pain     If told, put ice on the affected area. To do this: Put ice in a plastic bag. Place a towel between your skin and the bag. Leave the ice on for 20 minutes, 2-3 times a day. If your skin turns bright red, take off the ice right away to prevent skin damage. The risk of skin damage is higher if you cannot feel pain, heat, or cold. If told, put heat on the affected area. Do this as often as told by your doctor. Use the heat source that your doctor tells you to use, such as a moist heat pack or a heating pad. Place a towel between your skin and the heat source. Leave the heat on for 20-30 minutes. If your skin turns bright red, take off the heat right away to prevent burns. The risk of burns is higher if you cannot feel pain, heat, or cold. Activity  Return to your normal activities when your doctor says that it is safe. Avoid activities that make your symptoms worse. Take short rests during the day. When you rest for a long time, do some physical activity or stretching between periods of rest. Avoid sitting for a long time without moving. Get up and move around at least one time each  hour. Do exercises and stretches as told by your doctor. Do not lift anything that is heavier than 10 lb (4.5 kg). Avoid lifting heavy things even when you do not have symptoms. Avoid lifting heavy things over and over. When you lift objects, always lift in a way that is safe for your body. To do this, you should: Bend your knees. Keep the object close to your body. Avoid twisting. General instructions Stay at a healthy weight. Wear comfortable shoes that support your feet. Avoid wearing high heels. Avoid sleeping on a mattress  that is too soft or too hard. You might have less pain if you sleep on a mattress that is firm enough to support your back. Contact a doctor if: Your pain is not controlled by medicine. Your pain does not get better. Your pain gets worse. Your pain lasts longer than 4 weeks. You lose weight without trying. Get help right away if: You cannot control when you pee (urinate) or poop (have a bowel movement). You have weakness in any of these areas and it gets worse: Lower back. The area between your hip bones. Butt. Legs. You have redness or swelling of your back. You have a burning feeling when you pee. Summary Sciatica is pain, weakness, tingling, or loss of feeling (numbness) along the sciatic nerve. This may include the lower back, legs, hips, and butt. This condition happens when the sciatic nerve is pinched or has pressure put on it. Treatment often includes rest, exercise, medicines, and putting ice or heat on the affected area. This information is not intended to replace advice given to you by your health care provider. Make sure you discuss any questions you have with your health care provider. Document Revised: 12/03/2021 Document Reviewed: 12/03/2021 Elsevier Patient Education  2024 [employer].

## 2023-11-19 NOTE — Assessment & Plan Note (Signed)
 Active and affecting quality of life Tylenol and tramadol only minimally helping Recommend to start Lyrica 50 mg twice a day

## 2023-11-19 NOTE — Assessment & Plan Note (Signed)
 Active and affecting quality of life Spinal injections not helping Significant findings on lumbar spine MRI Evaluation by spinal surgeon Recommend to start Lyrica 50 mg twice a day May continue Tylenol and tramadol as needed

## 2023-11-19 NOTE — Progress Notes (Unsigned)
 Lindsay Travis 63 y.o.   Chief Complaint  Patient presents with  . Leg Pain    Bilateral leg pain. Pt states starts at her lower back then goes down her legs. She says it has gotten worse. No numbeness     HISTORY OF PRESENT ILLNESS: This is a 63 y.o. female complaining of persistent bilateral lumbar pain with radiation to posterior aspects of both legs MRI of lumbar spine done last December shows significant findings.  Patient was evaluated by sports medicine and referred for spinal injections but not helping much. Continues to have "nerve pain" down both legs daily.  Leg Pain     Prior to Admission medications   Medication Sig Start Date End Date Taking? Authorizing Provider  amLODipine (NORVASC) 5 MG tablet TAKE 1 TABLET BY MOUTH EVERY DAY 10/26/23  Yes Joleena Weisenburger, Eilleen Kempf, MD  Ascorbic Acid (VITAMIN C) 1000 MG tablet Take 1,000 mg by mouth 2 (two) times a week.   Yes [provider]  aspirin 81 MG EC tablet Take 1 tablet (81 mg total) by mouth daily. 03/20/18  Yes Ihor Austin, NP  blood glucose meter kit and supplies Per insurance preference. Check daily fasting blood glucose. (Dx. E11.9). 03/20/22  Yes Quaniya Damas, Eilleen Kempf, MD  famotidine (PEPCID) 20 MG tablet Take 1 tablet (20 mg total) by mouth 2 (two) times daily. 11/05/22  Yes Roemhildt, Lorin T, PA-C  FARXIGA 10 MG TABS tablet TAKE 1 TABLET BY MOUTH EVERY DAY BEFORE BREAKFAST 11/10/23  Yes Ovella Manygoats, Eilleen Kempf, MD  glipiZIDE (GLUCOTROL) 5 MG tablet TAKE 1 TABLET (5 MG TOTAL) BY MOUTH TWICE A DAY BEFORE MEALS 04/04/23  Yes Marvelous Bouwens, Eilleen Kempf, MD  glucose blood (ACCU-CHEK GUIDE) test strip Use as instructed 03/27/23  Yes Dayona Shaheen, Eilleen Kempf, MD  Melatonin 10 MG CAPS Take by mouth.   Yes [provider]  Omega-3 Fatty Acids (OMEGA-3 FISH OIL PO) Take 1 capsule by mouth in the morning.   Yes [provider]  oxybutynin (DITROPAN) 5 MG tablet Take 1 tablet (5 mg total) by mouth every 8 (eight) hours  as needed for bladder spasms. 06/25/23  Yes Muzamil Harker, Eilleen Kempf, MD  traMADol (ULTRAM) 50 MG tablet Take 1 tablet (50 mg total) by mouth every 6 (six) hours as needed (pain). 10/13/23  Yes Hudnall, Azucena Fallen, MD    Allergies  Allergen Reactions  . Lisinopril Other (See Comments)    Did not feel good  . Losartan Other (See Comments)    headache  . Aspirin Other (See Comments)    Abdominal pain only 325 mg     Patient Active Problem List   Diagnosis Date Noted  . Bilateral hip pain 08/28/2023  . Overactive bladder 06/25/2023  . Dyslipidemia associated with type 2 diabetes mellitus (HCC) 06/19/2022  . Statin myopathy 08/01/2021  . Obesity, diabetes, and hypertension syndrome (HCC) 01/09/2021  . Morbid obesity (HCC) 09/18/2019  . Dyslipidemia   . Type 2 diabetes mellitus with hyperglycemia, without long-term current use of insulin (HCC) 08/07/2015  . Varicose veins of bilateral lower extremities with other complications 02/24/2012  . Uterine leiomyoma 04/18/2011  . Hypertension associated with diabetes (HCC) 04/18/2011    Past Medical History:  Diagnosis Date  . Anemia   . Arthritis    bilateral kneed and lower legs  . Clotting disorder (HCC)   . Diabetes mellitus    recent high blood sugar and has RX but not taking  med -b/c she doesn't think she  is diabetic, just had lots of sugar in her diet when dx'd.-on meds  . Hx of adenomatous polyp of colon 07/2021   Repeat colonoscopy 2029  . Hypertension    on meds  . Shortness of breath    on exertion- has low hgb  . Sickle cell trait [facility])     Past Surgical History:  Procedure Laterality Date  . ABDOMINAL HYSTERECTOMY  09/11/2011   Procedure: HYSTERECTOMY ABDOMINAL;  Surgeon: Kathreen Cosier, MD;  Location: [facility];  Service: Gynecology;  Laterality: N/A;  . COLONOSCOPY WITH PROPOFOL N/A 07/16/2021   Procedure: COLONOSCOPY WITH PROPOFOL;  Surgeon: Iva Boop, MD;  Location: [facility];  Service: Endoscopy;  Laterality:  N/A;  . POLYPECTOMY  07/16/2021   Procedure: POLYPECTOMY;  Surgeon: Iva Boop, MD;  Location: [facility];  Service: Endoscopy;;    Social History   Socioeconomic History  . Marital status: Divorced    Spouse name: Not on file  . Number of children: Not on file  . Years of education: Not on file  . Highest education level: Not on file  Occupational History  . Not on file  Tobacco Use  . Smoking status: Never  . Smokeless tobacco: Never  Vaping Use  . Vaping status: Never Used  Substance and Sexual Activity  . Alcohol use: No  . Drug use: No  . Sexual activity: Never  Other Topics Concern  . Not on file  Social History Narrative  . Not on file   Social Drivers of Health   Financial Resource Strain: Not on file  Food Insecurity: Not on file  Transportation Needs: Not on file  Physical Activity: Not on file  Stress: Not on file  Social Connections: Not on file  Intimate Partner Violence: Not on file    Family History  Problem Relation Age of Onset  . Hypertension Other   . Colon polyps Neg Hx   . Colon cancer Neg Hx   . Heart disease Neg Hx   . Esophageal cancer Neg Hx   . Rectal cancer Neg Hx   . Stomach cancer Neg Hx      Review of Systems  Constitutional: Negative.   HENT: Negative.  Negative for congestion and sore throat.   Respiratory: Negative.  Negative for cough and shortness of breath.   Cardiovascular: Negative.  Negative for chest pain and palpitations.  Gastrointestinal:  Negative for abdominal pain, diarrhea, nausea and vomiting.  Genitourinary: Negative.  Negative for dysuria and hematuria.  Musculoskeletal:  Positive for back pain.  Skin: Negative.  Negative for rash.  Neurological: Negative.  Negative for dizziness and headaches.  All other systems reviewed and are negative.   Vitals:   11/19/23 1558  BP: 122/82  Pulse: 80  Temp: 98.8 F (37.1 C)  SpO2: 95%    Physical Exam Vitals reviewed.  Constitutional:       Appearance: Normal appearance.  HENT:     Head: Normocephalic.  Eyes:     Extraocular Movements: Extraocular movements intact.     Pupils: Pupils are equal, round, and reactive to light.  Cardiovascular:     Rate and Rhythm: Normal rate and regular rhythm.     Pulses: Normal pulses.     Heart sounds: Normal heart sounds.  Pulmonary:     Effort: Pulmonary effort is normal.     Breath sounds: Normal breath sounds.  Abdominal:     Palpations: Abdomen is soft.     Tenderness: There is  no abdominal tenderness.  Musculoskeletal:     Cervical back: No tenderness.  Lymphadenopathy:     Cervical: No cervical adenopathy.  Skin:    General: Skin is warm and dry.     Capillary Refill: Capillary refill takes less than 2 seconds.  Neurological:     General: No focal deficit present.     Mental Status: She is alert and oriented to person, place, and time.  Psychiatric:        Mood and Affect: Mood normal.        Behavior: Behavior normal.     ASSESSMENT & PLAN: A total of 32 minutes was spent with the patient and counseling/coordination of care regarding preparing for this visit, review of most recent office visit notes, review of multiple chronic medical conditions under management, review of all medications, review of lumbar spine MRI report from last December, pain management, need to follow-up with spinal surgeon, prognosis, documentation and need for follow-up  Problem List Items Addressed This Visit       Nervous and Auditory   Bilateral sciatica - Primary   Active and affecting quality of life Spinal injections not helping Significant findings on lumbar spine MRI Evaluation by spinal surgeon Recommend to start Lyrica 50 mg twice a day May continue Tylenol and tramadol as needed      Relevant Medications   pregabalin (LYRICA) 50 MG capsule   Other Relevant Orders   Ambulatory referral to Spine Surgery   Osteoarthritis of spine with radiculopathy, lumbar region    Significant degenerative lumbar spine and disc disease as seen on MRI done last December Was already evaluated by sports medicine Referred for spinal injections but not helping much Needs evaluation by spinal surgeon Referral placed today.      Relevant Medications   pregabalin (LYRICA) 50 MG capsule   Other Relevant Orders   Ambulatory referral to Spine Surgery     Other   Neuropathic pain   Active and affecting quality of life Tylenol and tramadol only minimally helping Recommend to start Lyrica 50 mg twice a day      Relevant Medications   pregabalin (LYRICA) 50 MG capsule   Other Relevant Orders   Ambulatory referral to Spine Surgery   Patient Instructions  Sciatica  Sciatica is pain, weakness, tingling, or loss of feeling (numbness) along the sciatic nerve. The sciatic nerve starts in the lower back and goes down the back of each leg. Sciatica usually affects one side of the body. Sciatica usually goes away on its own or with treatment. Sometimes, sciatica may come back. What are the causes? This condition happens when the sciatic nerve is pinched or has pressure put on it. This may be caused by: A disk in between the bones of the spine bulging out too far (herniated disk). Changes in the spinal disks due to aging. A condition that affects a muscle in the butt. Extra bone growth near the sciatic nerve. A break (fracture) of the area between your hip bones (pelvis). Pregnancy. Tumor. This is rare. What increases the risk? You are more likely to develop this condition if you: Play sports that put pressure or stress on the spine. Have poor strength and ease of movement (flexibility). Have had a back injury or back surgery. Sit for long periods of time. Do activities that involve bending or lifting over and over again. Are very overweight (obese). What are the signs or symptoms? Symptoms can vary from mild to very bad. They may  include: Any of these problems in the  lower back, leg, hip, or butt: Mild tingling, loss of feeling, or dull aches. A burning feeling. Sharp pains. Loss of feeling in the back of the calf or the sole of the foot. Leg weakness. Very bad back pain that makes it hard to move. These symptoms may get worse when you cough, sneeze, or laugh. They may also get worse when you sit or stand for long periods of time. How is this treated? This condition often gets better without any treatment. However, treatment may include: Changing or cutting back on physical activity when you have pain. Exercising, including strengthening and stretching. Putting ice or heat on the affected area. Shots of medicines to relieve pain and swelling or to relax your muscles. Surgery. Follow these instructions at home: Medicines Take over-the-counter and prescription medicines only as told by your doctor. Ask your doctor if you should avoid driving or using machines while you are taking your medicine. Managing pain     If told, put ice on the affected area. To do this: Put ice in a plastic bag. Place a towel between your skin and the bag. Leave the ice on for 20 minutes, 2-3 times a day. If your skin turns bright red, take off the ice right away to prevent skin damage. The risk of skin damage is higher if you cannot feel pain, heat, or cold. If told, put heat on the affected area. Do this as often as told by your doctor. Use the heat source that your doctor tells you to use, such as a moist heat pack or a heating pad. Place a towel between your skin and the heat source. Leave the heat on for 20-30 minutes. If your skin turns bright red, take off the heat right away to prevent burns. The risk of burns is higher if you cannot feel pain, heat, or cold. Activity  Return to your normal activities when your doctor says that it is safe. Avoid activities that make your symptoms worse. Take short rests during the day. When you rest for a long time, do some  physical activity or stretching between periods of rest. Avoid sitting for a long time without moving. Get up and move around at least one time each hour. Do exercises and stretches as told by your doctor. Do not lift anything that is heavier than 10 lb (4.5 kg). Avoid lifting heavy things even when you do not have symptoms. Avoid lifting heavy things over and over. When you lift objects, always lift in a way that is safe for your body. To do this, you should: Bend your knees. Keep the object close to your body. Avoid twisting. General instructions Stay at a healthy weight. Wear comfortable shoes that support your feet. Avoid wearing high heels. Avoid sleeping on a mattress that is too soft or too hard. You might have less pain if you sleep on a mattress that is firm enough to support your back. Contact a doctor if: Your pain is not controlled by medicine. Your pain does not get better. Your pain gets worse. Your pain lasts longer than 4 weeks. You lose weight without trying. Get help right away if: You cannot control when you pee (urinate) or poop (have a bowel movement). You have weakness in any of these areas and it gets worse: Lower back. The area between your hip bones. Butt. Legs. You have redness or swelling of your back. You have a burning feeling when you pee.  Summary Sciatica is pain, weakness, tingling, or loss of feeling (numbness) along the sciatic nerve. This may include the lower back, legs, hips, and butt. This condition happens when the sciatic nerve is pinched or has pressure put on it. Treatment often includes rest, exercise, medicines, and putting ice or heat on the affected area. This information is not intended to replace advice given to you by your health care provider. Make sure you discuss any questions you have with your health care provider. Document Revised: 12/03/2021 Document Reviewed: 12/03/2021 Elsevier Patient Education  2024 Elsevier  Inc.   Edwina Barth, MD [facility]

## 2023-11-20 NOTE — Telephone Encounter (Signed)
 Okay to proceed.

## 2023-11-21 ENCOUNTER — Telehealth: Payer: Self-pay | Admitting: Emergency Medicine

## 2023-11-21 ENCOUNTER — Other Ambulatory Visit: Payer: Self-pay | Admitting: Radiology

## 2023-11-21 DIAGNOSIS — E1159 Type 2 diabetes mellitus with other circulatory complications: Secondary | ICD-10-CM

## 2023-11-21 MED ORDER — DAPAGLIFLOZIN PROPANEDIOL 10 MG PO TABS: 10.0000 mg | ORAL_TABLET | Freq: Every day | ORAL | 2 refills | Status: DC

## 2023-11-21 MED ORDER — AMLODIPINE BESYLATE 5 MG PO TABS: 5.0000 mg | ORAL_TABLET | Freq: Every day | ORAL | 3 refills | Status: DC

## 2023-11-21 NOTE — Addendum Note (Signed)
 Addended by: Dierdre Searles on: 11/21/2023 10:04 AM   Modules accepted: Orders

## 2023-11-21 NOTE — Telephone Encounter (Signed)
 Order faxed to [facility] at [phone]

## 2023-11-21 NOTE — Telephone Encounter (Signed)
 Copied from CRM [id number]. Topic: Clinical - Prescription Issue >> Nov 21, 2023  1:07 PM Fredrich Romans wrote: Reason for CRM: Patient would like to have medications amLODipine (NORVASC) 5 MG tablet and FARXIGA 10 MG TABS tablet sent to   [facility] #[id number] [place] Otto, [place] - [address] AT [facility] OF [address]  Phone: [phone] Fax: [phone]

## 2023-11-27 ENCOUNTER — Telehealth: Payer: Self-pay

## 2023-11-27 NOTE — Telephone Encounter (Signed)
 Copied from CRM [id number]. Topic: Clinical - Prescription Issue >> Nov 21, 2023  1:07 PM Fredrich Romans wrote: Reason for CRM: Patient would like to have medications amLODipine (NORVASC) 5 MG tablet and FARXIGA 10 MG TABS tablet sent to   [facility] #[id number] [place]in[place] Otto, [place] - [address] AT [facility] OF [address]  Phone: [phone] Fax: [phone] >> Nov 27, 2023  8:59 AM Truddie Crumble wrote: Patient called stating that her pharmacy does not have her amlodipine medication there and she is out of it

## 2023-12-22 ENCOUNTER — Telehealth: Payer: Self-pay | Admitting: Emergency Medicine

## 2023-12-22 NOTE — Telephone Encounter (Deleted)
 Copied from CRM [id number]. Topic: Clinical - Medication Refill >> Dec 22, 2023  4:49 PM [name] D wrote: Most Recent Primary Care Visit:  Provider: Elvira Hammersmith  Department[facility]  Visit Type: ACUTE  Date: 11/19/2023  Medication: Gabapentin  Has the patient contacted their pharmacy? No (Agent: If no, request that the patient contact the pharmacy for the refill. If patient does not wish to contact the pharmacy document the reason why and proceed with request.) (Agent: If yes, when and what did the pharmacy advise?)  Is this the correct pharmacy for this prescription? Yes If no, delete pharmacy and type the correct one.  This is the patient's preferred pharmacy:   [facility] #[id number] [name], [place] - [address] AT [facility] OF [facility] [name] [place] [address] Phone: [phone] Fax: [phone]   Has the prescription been filled recently? No  Is the patient out of the medication? Yes  Has the patient been seen for an appointment in the last year OR does the patient have an upcoming appointment? Yes  Can we respond through MyChart? No  Agent: Please be advised that Rx refills may take up to 3 business days. We ask that you follow-up with your pharmacy.

## 2023-12-22 NOTE — Telephone Encounter (Unsigned)
 Copied from CRM [id number]. Topic: Clinical - Medication Refill >> Dec 22, 2023  5:26 PM [name] D wrote: Most Recent Primary Care Visit:  Provider: Elvira Hammersmith  Department: [facility]  Visit Type: ACUTE  Date: 11/19/2023  Medication: Gabapentin  Has the patient contacted their pharmacy? No (Agent: If no, request that the patient contact the pharmacy for the refill. If patient does not wish to contact the pharmacy document the reason why and proceed with request.) (Agent: If yes, when and what did the pharmacy advise?)  Is this the correct pharmacy for this prescription? Yes If no, delete pharmacy and type the correct one.  This is the patient's preferred pharmacy:   [facility] #[id number] [name], [place] - [address] AT [facility] OF [facility] [name] [place] [address] Phone: [phone] Fax: [phone]   Has the prescription been filled recently? No  Is the patient out of the medication? Yes  Has the patient been seen for an appointment in the last year OR does the patient have an upcoming appointment? Yes  Can we respond through MyChart? Yes  Agent: Please be advised that Rx refills may take up to 3 business days. We ask that you follow-up with your pharmacy.

## 2023-12-23 ENCOUNTER — Telehealth: Payer: Self-pay | Admitting: Emergency Medicine

## 2023-12-23 NOTE — Telephone Encounter (Signed)
 Copied from CRM [id number]. Topic: Referral - Question >> Dec 22, 2023  5:21 PM [name] D wrote: Reason for CRM: Pt stated that the referral from Dr. Sagardia for surgery is not accepted by her insurance [employer], she is requesting a referral to a different DR that is in network with her coverage.

## 2023-12-25 NOTE — Telephone Encounter (Signed)
 Patient has applied for new insurance with [employer], will provide us  with the new insurance information

## 2023-12-25 NOTE — Telephone Encounter (Signed)
 Spoke with patient, she seems very confused on her medications. States she is taking Lyrica BID, she is unsure why Gabapenitn was D/C. Explained to patient that Lyrica is for her pain also, asked if patient has followed up with [facility]. She states they do not take her [employer], she has switched to [employer]. That plan will be effective May 1st. Advised patient to provide us  with her new insurance information at her earliest convenience so we can update her chart and place a new referral.   Dr.Sagardia, just an FYI.

## 2023-12-25 NOTE — Telephone Encounter (Signed)
Acknowledged. Thanks

## 2024-01-19 ENCOUNTER — Ambulatory Visit: Admitting: Emergency Medicine

## 2024-01-20 ENCOUNTER — Encounter: Payer: Self-pay | Admitting: Emergency Medicine

## 2024-01-20 ENCOUNTER — Ambulatory Visit: Admitting: Emergency Medicine

## 2024-01-20 VITALS — BP 144/84 | HR 86 | Temp 99.4°F | Ht 65.0 in | Wt 228.0 lb

## 2024-01-20 DIAGNOSIS — E785 Hyperlipidemia, unspecified: Secondary | ICD-10-CM

## 2024-01-20 DIAGNOSIS — E1159 Type 2 diabetes mellitus with other circulatory complications: Secondary | ICD-10-CM

## 2024-01-20 DIAGNOSIS — M4726 Other spondylosis with radiculopathy, lumbar region: Secondary | ICD-10-CM | POA: Diagnosis not present

## 2024-01-20 DIAGNOSIS — M5431 Sciatica, right side: Secondary | ICD-10-CM

## 2024-01-20 DIAGNOSIS — I152 Hypertension secondary to endocrine disorders: Secondary | ICD-10-CM

## 2024-01-20 DIAGNOSIS — M5432 Sciatica, left side: Secondary | ICD-10-CM

## 2024-01-20 DIAGNOSIS — E1169 Type 2 diabetes mellitus with other specified complication: Secondary | ICD-10-CM | POA: Diagnosis not present

## 2024-01-20 DIAGNOSIS — Z7984 Long term (current) use of oral hypoglycemic drugs: Secondary | ICD-10-CM

## 2024-01-20 DIAGNOSIS — M792 Neuralgia and neuritis, unspecified: Secondary | ICD-10-CM

## 2024-01-20 LAB — POCT GLYCOSYLATED HEMOGLOBIN (HGB A1C): Hemoglobin A1C: 7.7 % — AB (ref 4.0–5.6)

## 2024-01-20 MED ORDER — DAPAGLIFLOZIN PROPANEDIOL 10 MG PO TABS: 10.0000 mg | ORAL_TABLET | Freq: Every day | ORAL | 2 refills | Status: DC

## 2024-01-20 NOTE — Assessment & Plan Note (Signed)
 Active and affecting quality of life Spinal injections not helping Significant findings on lumbar spine MRI Evaluation by spinal surgeon recommended Referral placed today. Continue Lyrica  50 mg twice a day May continue Tylenol  and tramadol  as needed

## 2024-01-20 NOTE — Assessment & Plan Note (Signed)
 Significant degenerative lumbar spine and disc disease as seen on MRI done last December Was already evaluated by sports medicine Referred for spinal injections but not helping much Needs evaluation by spinal surgeon Referral placed today.

## 2024-01-20 NOTE — Patient Instructions (Signed)

## 2024-01-20 NOTE — Assessment & Plan Note (Signed)
 Improved hemoglobin A1c at 7.7 Continue glipizide  5 mg twice a day and Farxiga  10 mg daily Intolerant to statins

## 2024-01-20 NOTE — Assessment & Plan Note (Signed)
 Active and affecting quality of life Tylenol and tramadol only minimally helping Recommend to start Lyrica 50 mg twice a day

## 2024-01-20 NOTE — Progress Notes (Signed)
 Lindsay Travis 63 y.o.   Chief Complaint  Patient presents with   Leg Pain    Patient here for both leg/ and feet pain. She states theres no pain when she is sitting but when is walking she feels the pain. She wants a referral to a specialist.     HISTORY OF PRESENT ILLNESS: This is a 63 y.o. female with history of degenerative lumbar spine severe disease with radiculopathy and chronic low back pain, today complaining of similar pains for the past several months.  Requesting referral to spine surgeon. Also has history of diabetes.  Here for follow-up. No other complaints or medical concerns today.  Leg Pain      Prior to Admission medications   Medication Sig Start Date End Date Taking? Authorizing Provider  amLODipine  (NORVASC ) 5 MG tablet Take 1 tablet (5 mg total) by mouth daily. 11/21/23  Yes Emersen Mascari, Isidro Margo, MD  Ascorbic Acid (VITAMIN C) 1000 MG tablet Take 1,000 mg by mouth 2 (two) times a week.   Yes [provider]  aspirin  81 MG EC tablet Take 1 tablet (81 mg total) by mouth daily. 03/20/18  Yes Johny Nap, NP  blood glucose meter kit and supplies Per insurance preference. Check daily fasting blood glucose. (Dx. E11.9). 03/20/22  Yes Lillie Portner, Isidro Margo, MD  famotidine  (PEPCID ) 20 MG tablet Take 1 tablet (20 mg total) by mouth 2 (two) times daily. 11/05/22  Yes Roemhildt, Lorin T, PA-C  glipiZIDE  (GLUCOTROL ) 5 MG tablet TAKE 1 TABLET (5 MG TOTAL) BY MOUTH TWICE A DAY BEFORE MEALS 04/04/23  Yes Lauria Depoy, Isidro Margo, MD  glucose blood (ACCU-CHEK GUIDE) test strip Use as instructed 03/27/23  Yes Carmisha Larusso, Isidro Margo, MD  Melatonin 10 MG CAPS Take by mouth.   Yes [provider]  Omega-3 Fatty Acids (OMEGA-3 FISH OIL PO) Take 1 capsule by mouth in the morning.   Yes [provider]  pregabalin  (LYRICA ) 50 MG capsule Take 1 capsule (50 mg total) by mouth 2 (two) times daily. 11/19/23  Yes Demetreus Lothamer, Isidro Margo, MD  traMADol  (ULTRAM ) 50 MG tablet  Take 1 tablet (50 mg total) by mouth every 6 (six) hours as needed (pain). 10/13/23  Yes Hudnall, Fabienne Holter, MD  dapagliflozin  propanediol (FARXIGA ) 10 MG TABS tablet Take 1 tablet (10 mg total) by mouth daily. 01/20/24   Elvira Hammersmith, MD  oxybutynin  (DITROPAN ) 5 MG tablet Take 1 tablet (5 mg total) by mouth every 8 (eight) hours as needed for bladder spasms. Patient not taking: Reported on 01/20/2024 06/25/23   Elvira Hammersmith, MD    Allergies  Allergen Reactions   Lisinopril Other (See Comments)    Did not feel good   Losartan  Other (See Comments)    headache   Aspirin  Other (See Comments)    Abdominal pain only 325 mg     Patient Active Problem List   Diagnosis Date Noted   Neuropathic pain 11/19/2023   Bilateral sciatica 11/19/2023   Osteoarthritis of spine with radiculopathy, lumbar region 11/19/2023   Bilateral hip pain 08/28/2023   Overactive bladder 06/25/2023   Dyslipidemia associated with type 2 diabetes mellitus (HCC) 06/19/2022   Statin myopathy 08/01/2021   Obesity, diabetes, and hypertension syndrome (HCC) 01/09/2021   Morbid obesity (HCC) 09/18/2019   Dyslipidemia    Type 2 diabetes mellitus with hyperglycemia, without long-term current use of insulin  (HCC) 08/07/2015   Varicose veins of bilateral lower extremities with other complications 02/24/2012   Uterine leiomyoma 04/18/2011  Hypertension associated with diabetes (HCC) 04/18/2011    Past Medical History:  Diagnosis Date   Anemia    Arthritis    bilateral kneed and lower legs   Clotting disorder (HCC)    Diabetes mellitus    recent high blood sugar and has RX but not taking  med -b/c she doesn't think she  is diabetic, just had lots of sugar in her diet when dx'd.-on meds   Hx of adenomatous polyp of colon 07/2021   Repeat colonoscopy 2029   Hypertension    on meds   Shortness of breath    on exertion- has low hgb   Sickle cell trait [facility])     Past Surgical History:  Procedure  Laterality Date   ABDOMINAL HYSTERECTOMY  09/11/2011   Procedure: HYSTERECTOMY ABDOMINAL;  Surgeon: Heide Livings, MD;  Location: [facility];  Service: Gynecology;  Laterality: N/A;   COLONOSCOPY WITH PROPOFOL  N/A 07/16/2021   Procedure: COLONOSCOPY WITH PROPOFOL ;  Surgeon: Kenney Peacemaker, MD;  Location: [facility];  Service: Endoscopy;  Laterality: N/A;   POLYPECTOMY  07/16/2021   Procedure: POLYPECTOMY;  Surgeon: Kenney Peacemaker, MD;  Location: [facility];  Service: Endoscopy;;    Social History   Socioeconomic History   Marital status: Divorced    Spouse name: Not on file   Number of children: Not on file   Years of education: Not on file   Highest education level: Not on file  Occupational History   Not on file  Tobacco Use   Smoking status: Never   Smokeless tobacco: Never  Vaping Use   Vaping status: Never Used  Substance and Sexual Activity   Alcohol use: No   Drug use: No   Sexual activity: Never  Other Topics Concern   Not on file  Social History Narrative   Not on file   Social Drivers of Health   Financial Resource Strain: Not on file  Food Insecurity: Not on file  Transportation Needs: Not on file  Physical Activity: Not on file  Stress: Not on file  Social Connections: Not on file  Intimate Partner Violence: Not on file    Family History  Problem Relation Age of Onset   Hypertension Other    Colon polyps Neg Hx    Colon cancer Neg Hx    Heart disease Neg Hx    Esophageal cancer Neg Hx    Rectal cancer Neg Hx    Stomach cancer Neg Hx      Review of Systems  Constitutional: Negative.  Negative for chills and fever.  HENT: Negative.  Negative for congestion and sore throat.   Respiratory: Negative.  Negative for cough and shortness of breath.   Cardiovascular: Negative.  Negative for chest pain and palpitations.  Gastrointestinal:  Negative for abdominal pain, nausea and vomiting.  Musculoskeletal:  Positive for back pain.  Skin: Negative.   Negative for rash.  Neurological: Negative.  Negative for dizziness and headaches.  All other systems reviewed and are negative.   Vitals:   01/20/24 1529  BP: (!) 144/84  Pulse: 86  Temp: 99.4 F (37.4 C)  SpO2: 95%    Physical Exam Vitals reviewed.  Constitutional:      Appearance: Normal appearance.  HENT:     Head: Normocephalic.  Eyes:     Extraocular Movements: Extraocular movements intact.  Cardiovascular:     Rate and Rhythm: Normal rate and regular rhythm.  Pulmonary:     Effort: Pulmonary effort  is normal.     Breath sounds: Normal breath sounds.  Skin:    General: Skin is warm and dry.  Neurological:     Mental Status: She is alert and oriented to person, place, and time.  Psychiatric:        Mood and Affect: Mood normal.        Behavior: Behavior normal.    Results for orders placed or performed in visit on 01/20/24 (from the past 24 hours)  POCT HgB A1C     Status: Abnormal   Collection Time: 01/20/24  3:49 PM  Result Value Ref Range   Hemoglobin A1C 7.7 (A) 4.0 - 5.6 %   HbA1c POC (<> result, manual entry)     HbA1c, POC (prediabetic range)     HbA1c, POC (controlled diabetic range)       ASSESSMENT & PLAN: A total of 44 minutes was spent with the patient and counseling/coordination of care regarding preparing for this visit, review of most recent office visit notes, review of multiple chronic medical conditions and their management, cardiovascular risks associated with diabetes and hypertension, review of all medications, review of most recent bloodwork results including interpretation of today's hemoglobin A1c, review of health maintenance items, education on nutrition, prognosis, documentation, and need for follow up.   Problem List Items Addressed This Visit       Cardiovascular and Mediastinum   Hypertension associated with diabetes (HCC) - Primary   BP Readings from Last 3 Encounters:  01/20/24 (!) 144/84  11/19/23 122/82  10/13/23 138/88   Elevated blood pressure reading in the office but normal readings at home Continue amlodipine  5 mg daily Hemoglobin A1c better than before 7.7. Continue taking glipizide  5 mg twice a day and continue Farxiga  10 mg daily Cardiovascular risks associated with uncontrolled diabetes and hypertension discussed Diet and nutrition discussed Follow-up in 3 months.      Relevant Medications   dapagliflozin  propanediol (FARXIGA ) 10 MG TABS tablet     Endocrine   Dyslipidemia associated with type 2 diabetes mellitus (HCC)   Improved hemoglobin A1c at 7.7 Continue glipizide  5 mg twice a day and Farxiga  10 mg daily Intolerant to statins      Relevant Medications   dapagliflozin  propanediol (FARXIGA ) 10 MG TABS tablet   Other Relevant Orders   POCT HgB A1C (Completed)     Nervous and Auditory   Bilateral sciatica   Active and affecting quality of life Spinal injections not helping Significant findings on lumbar spine MRI Evaluation by spinal surgeon recommended Referral placed today. Continue Lyrica  50 mg twice a day May continue Tylenol  and tramadol  as needed      Relevant Orders   Ambulatory referral to Spine Surgery   Osteoarthritis of spine with radiculopathy, lumbar region   Significant degenerative lumbar spine and disc disease as seen on MRI done last December Was already evaluated by sports medicine Referred for spinal injections but not helping much Needs evaluation by spinal surgeon Referral placed today.      Relevant Orders   Ambulatory referral to Spine Surgery     Other   Morbid obesity (HCC)   Diet and nutrition discussed benefit Benefits of exercise discussed Advised to decrease amount of daily carbohydrate intake and daily calories and increase amount of plant-based protein in her diet Follow-up in 6 months      Relevant Medications   dapagliflozin  propanediol (FARXIGA ) 10 MG TABS tablet   Neuropathic pain   Active and affecting quality of  life Tylenol   and tramadol  only minimally helping Recommend to start Lyrica  50 mg twice a day      Patient Instructions  Diabetes Mellitus and Nutrition, Adult When you have diabetes, or diabetes mellitus, it is very important to have healthy eating habits because your blood sugar (glucose) levels are greatly affected by what you eat and drink. Eating healthy foods in the right amounts, at about the same times every day, can help you: Manage your blood glucose. Lower your risk of heart disease. Improve your blood pressure. Reach or maintain a healthy weight. What can affect my meal plan? Every person with diabetes is different, and each person has different needs for a meal plan. Your health care provider may recommend that you work with a dietitian to make a meal plan that is best for you. Your meal plan may vary depending on factors such as: The calories you need. The medicines you take. Your weight. Your blood glucose, blood pressure, and cholesterol levels. Your activity level. Other health conditions you have, such as heart or kidney disease. How do carbohydrates affect me? Carbohydrates, also called carbs, affect your blood glucose level more than any other type of food. Eating carbs raises the amount of glucose in your blood. It is important to know how many carbs you can safely have in each meal. This is different for every person. Your dietitian can help you calculate how many carbs you should have at each meal and for each snack. How does alcohol affect me? Alcohol can cause a decrease in blood glucose (hypoglycemia), especially if you use insulin  or take certain diabetes medicines by mouth. Hypoglycemia can be a life-threatening condition. Symptoms of hypoglycemia, such as sleepiness, dizziness, and confusion, are similar to symptoms of having too much alcohol. Do not drink alcohol if: Your health care provider tells you not to drink. You are pregnant, may be pregnant, or are planning to  become pregnant. If you drink alcohol: Limit how much you have to: 0-1 drink a day for women. 0-2 drinks a day for men. Know how much alcohol is in your drink. In the [place] one drink equals one 12 oz bottle of beer (355 mL), one 5 oz glass of wine (148 mL), or one 1 oz glass of hard liquor (44 mL). Keep yourself hydrated with water, diet soda, or unsweetened iced tea. Keep in mind that regular soda, juice, and other mixers may contain a lot of sugar and must be counted as carbs. What are tips for following this plan?  Reading food labels Start by checking the serving size on the Nutrition Facts label of packaged foods and drinks. The number of calories and the amount of carbs, fats, and other nutrients listed on the label are based on one serving of the item. Many items contain more than one serving per package. Check the total grams (g) of carbs in one serving. Check the number of grams of saturated fats and trans fats in one serving. Choose foods that have a low amount or none of these fats. Check the number of milligrams (mg) of salt (sodium) in one serving. Most people should limit total sodium intake to less than 2,300 mg per day. Always check the nutrition information of foods labeled as "low-fat" or "nonfat." These foods may be higher in added sugar or refined carbs and should be avoided. Talk to your dietitian to identify your daily goals for nutrients listed on the label. Shopping Avoid buying canned, pre-made, or processed foods.  These foods tend to be high in fat, sodium, and added sugar. Shop around the outside edge of the grocery store. This is where you will most often find fresh fruits and vegetables, bulk grains, fresh meats, and fresh dairy products. Cooking Use low-heat cooking methods, such as baking, instead of high-heat cooking methods, such as deep frying. Cook using healthy oils, such as olive, canola, or sunflower oil. Avoid cooking with butter, cream, or high-fat  meats. Meal planning Eat meals and snacks regularly, preferably at the same times every day. Avoid going long periods of time without eating. Eat foods that are high in fiber, such as fresh fruits, vegetables, beans, and whole grains. Eat 4-6 oz (112-168 g) of lean protein each day, such as lean meat, chicken, fish, eggs, or tofu. One ounce (oz) (28 g) of lean protein is equal to: 1 oz (28 g) of meat, chicken, or fish. 1 egg.  cup (62 g) of tofu. Eat some foods each day that contain healthy fats, such as avocado, nuts, seeds, and fish. What foods should I eat? Fruits Berries. Apples. Oranges. Peaches. Apricots. Plums. Grapes. Mangoes. Papayas. Pomegranates. Kiwi. Cherries. Vegetables Leafy greens, including lettuce, spinach, kale, chard, collard greens, mustard greens, and cabbage. Beets. Cauliflower. Broccoli. Carrots. Green beans. Tomatoes. Peppers. Onions. Cucumbers. Brussels sprouts. Grains Whole grains, such as whole-wheat or whole-grain bread, crackers, tortillas, cereal, and pasta. Unsweetened oatmeal. Quinoa. Brown or wild rice. Meats and other proteins Seafood. Poultry without skin. Lean cuts of poultry and beef. Tofu. Nuts. Seeds. Dairy Low-fat or fat-free dairy products such as milk, yogurt, and cheese. The items listed above may not be a complete list of foods and beverages you can eat and drink. Contact a dietitian for more information. What foods should I avoid? Fruits Fruits canned with syrup. Vegetables Canned vegetables. Frozen vegetables with butter or cream sauce. Grains Refined white flour and flour products such as bread, pasta, snack foods, and cereals. Avoid all processed foods. Meats and other proteins Fatty cuts of meat. Poultry with skin. Breaded or fried meats. Processed meat. Avoid saturated fats. Dairy Full-fat yogurt, cheese, or milk. Beverages Sweetened drinks, such as soda or iced tea. The items listed above may not be a complete list of foods and  beverages you should avoid. Contact a dietitian for more information. Questions to ask a health care provider Do I need to meet with a certified diabetes care and education specialist? Do I need to meet with a dietitian? What number can I call if I have questions? When are the best times to check my blood glucose? Where to find more information: American Diabetes Association: diabetes.org Academy of Nutrition and Dietetics: eatright.[employer] of Diabetes and Digestive and Kidney Diseases: StageSync.si Association of Diabetes Care & Education Specialists: diabeteseducator.org Summary It is important to have healthy eating habits because your blood sugar (glucose) levels are greatly affected by what you eat and drink. It is important to use alcohol carefully. A healthy meal plan will help you manage your blood glucose and lower your risk of heart disease. Your health care provider may recommend that you work with a dietitian to make a meal plan that is best for you. This information is not intended to replace advice given to you by your health care provider. Make sure you discuss any questions you have with your health care provider. Document Revised: 03/28/2020 Document Reviewed: 03/29/2020 Elsevier Patient Education  2024 Elsevier Inc.      Maryagnes Small, MD [facility]  at [facility]

## 2024-01-20 NOTE — Assessment & Plan Note (Addendum)
 Diet and nutrition discussed benefit Benefits of exercise discussed Advised to decrease amount of daily carbohydrate intake and daily calories and increase amount of plant-based protein in her diet Follow-up in 6 months

## 2024-01-20 NOTE — Assessment & Plan Note (Signed)
 BP Readings from Last 3 Encounters:  01/20/24 (!) 144/84  11/19/23 122/82  10/13/23 138/88  Elevated blood pressure reading in the office but normal readings at home Continue amlodipine  5 mg daily Hemoglobin A1c better than before 7.7. Continue taking glipizide  5 mg twice a day and continue Farxiga  10 mg daily Cardiovascular risks associated with uncontrolled diabetes and hypertension discussed Diet and nutrition discussed Follow-up in 3 months.

## 2024-02-04 ENCOUNTER — Telehealth: Payer: Self-pay | Admitting: Emergency Medicine

## 2024-02-04 ENCOUNTER — Other Ambulatory Visit: Payer: Self-pay | Admitting: Emergency Medicine

## 2024-02-04 ENCOUNTER — Other Ambulatory Visit: Payer: Self-pay | Admitting: Radiology

## 2024-02-04 DIAGNOSIS — E1165 Type 2 diabetes mellitus with hyperglycemia: Secondary | ICD-10-CM

## 2024-02-04 MED ORDER — GLIPIZIDE 5 MG PO TABS
5.0000 mg | ORAL_TABLET | Freq: Two times a day (BID) | ORAL | 1 refills | Status: DC
Start: 2024-02-04 — End: 2024-02-16

## 2024-02-04 NOTE — Telephone Encounter (Unsigned)
 Copied from CRM [id number]. Topic: Clinical - Medication Refill >> Feb 04, 2024 11:42 AM Juleen Oakland F wrote: Medication: glipiZIDE  (GLUCOTROL ) 5 MG tablet  Has the patient contacted their pharmacy? Yes (Agent: If no, request that the patient contact the pharmacy for the refill. If patient does not wish to contact the pharmacy document the reason why and proceed with request.) (Agent: If yes, when and what did the pharmacy advise?)  This is the patient's preferred pharmacy:   [id number] [name], [place] - [address] AT [facility] [name] [place] [address] Phone: [phone] Fax: [phone]  Is this the correct pharmacy for this prescription? Yes If no, delete pharmacy and type the correct one.   Has the prescription been filled recently? Yes  Is the patient out of the medication? Yes  Has the patient been seen for an appointment in the last year OR does the patient have an upcoming appointment? Yes  Can we respond through MyChart? No  Agent: Please be advised that Rx refills may take up to 3 business days. We ask that you follow-up with your pharmacy.

## 2024-02-04 NOTE — Telephone Encounter (Signed)
 Copied from CRM [id number]. Topic: Clinical - Medication Question >> Feb 04, 2024 11:44 AM Lindsay Travis wrote: Reason for CRM: Patient says her copay for the dapagliflozin  propanediol (FARXIGA ) 10 MG TABS tablet is too expensive and would like a call back regarding an alternative that will be covered by insurance.

## 2024-02-04 NOTE — Telephone Encounter (Signed)
 Referral for patient assistants has been placed to help patient with Farxiga 

## 2024-02-05 ENCOUNTER — Telehealth: Payer: Self-pay | Admitting: *Deleted

## 2024-02-05 NOTE — Progress Notes (Signed)
 Care Guide Pharmacy Note  02/05/2024 Name: Lindsay Travis MRN: [id number] DOB: [birth year]  Referred By: Lindsay Hammersmith, MD Reason for referral: Complex Care Management (Outreach to schedule referral with pharmacist )   Lindsay Travis is a 63 y.o. year old female who is a primary care patient of Lindsay Travis, Lindsay Jose, MD.  Lindsay Travis was referred to the pharmacist for assistance related to: DMII  Successful contact was made with the patient to discuss pharmacy services including being ready for the pharmacist to call at least 5 minutes before the scheduled appointment time and to have medication bottles and any blood pressure readings ready for review. The patient agreed to meet with the pharmacist via telephone visit on 02/13/2024  Lindsay Travis, CMA [facility], [facility] Guide Direct Dial: [phone]  Fax: [phone] Website: [email or web address]

## 2024-02-10 ENCOUNTER — Telehealth: Payer: Self-pay | Admitting: Emergency Medicine

## 2024-02-10 ENCOUNTER — Other Ambulatory Visit: Payer: Self-pay | Admitting: Emergency Medicine

## 2024-02-10 DIAGNOSIS — M792 Neuralgia and neuritis, unspecified: Secondary | ICD-10-CM

## 2024-02-10 NOTE — Telephone Encounter (Unsigned)
 Copied from CRM [id number]. Topic: Referral - Question >> Feb 10, 2024 11:43 AM Lindsay Travis wrote: Reason for CRM: Patient called in stated the neurologist  office that the referral was sent to is not in network with her insurance would like for it to be sent to another neurologist office

## 2024-02-13 ENCOUNTER — Other Ambulatory Visit: Admitting: Pharmacist

## 2024-02-13 DIAGNOSIS — E1165 Type 2 diabetes mellitus with hyperglycemia: Secondary | ICD-10-CM

## 2024-02-13 MED ORDER — METFORMIN HCL ER 500 MG PO TB24: 1000.0000 mg | ORAL_TABLET | Freq: Every day | ORAL | 1 refills | Status: AC

## 2024-02-13 MED ORDER — BLOOD GLUCOSE TEST VI STRP: 1.0000 | ORAL_STRIP | Freq: Every day | 11 refills | Status: AC

## 2024-02-13 MED ORDER — LANCET DEVICE MISC: 1.0000 | Freq: Every day | 0 refills | Status: AC

## 2024-02-13 MED ORDER — LANCETS MISC. MISC: 1.0000 | Freq: Every day | 0 refills | Status: AC

## 2024-02-13 MED ORDER — BLOOD GLUCOSE MONITORING SUPPL DEVI: 1.0000 | Freq: Every day | 0 refills | Status: AC

## 2024-02-13 NOTE — Progress Notes (Signed)
 02/13/2024 Name: Lindsay Travis MRN: [id number] DOB: [birth year]  Chief Complaint  Patient presents with    Medication access    Lindsay Travis is a 63 y.o. year old female who presented for a telephone visit.   They were referred to the pharmacist by their PCP for assistance in managing medication access.   Subjective:  Care Team: Primary Care Provider: Elvira Hammersmith, MD ; Next Scheduled Visit: 02/17/24   Medication Access/Adherence  Current Pharmacy:  [id number] [name], [place] - [address] [place] [address] Phone: [phone] Fax: [phone]  [id number] [name], [place] - [address] AT [facility] [address] [facility] [name] [address] Phone: [phone] Fax: [phone]   Patient reports affordability concerns with their medications: Yes  Patient reports access/transportation concerns to their pharmacy: No  Patient reports adherence concerns with their medications:  No     Diabetes:  Current medications: glipizide  5 mg BID Medications tried in the past: metformin  ER  Pt notes metformin  caused dry mouth? Willing to try it again.  Farxiga  was $800 at the pharmacy with her insurance    Objective:  Lab Results  Component Value Date   HGBA1C 7.7 (A) 01/20/2024    Lab Results  Component Value Date   CREATININE 0.83 11/05/2022   BUN 11 11/05/2022   NA 142 11/05/2022   K 3.4 (L) 11/05/2022   CL 105 11/05/2022   CO2 23 11/05/2022    Lab Results  Component Value Date   CHOL 212 (H) 08/01/2021   HDL 57.40 08/01/2021   LDLCALC 116 (H) 08/01/2021   TRIG 192.0 (H) 08/01/2021   CHOLHDL 4 08/01/2021    Medications Reviewed Today     Reviewed by Dion Frankel, RPH (Pharmacist) on 02/13/24 at 1328  Med List Status: <None>   Medication Order Taking? Sig Documenting Provider Last Dose Status Informant  amLODipine  (NORVASC ) 5  MG tablet [id number]  Take 1 tablet (5 mg total) by mouth daily. Sagardia, Miguel Jose, MD  Active   Ascorbic Acid (VITAMIN C) 1000 MG tablet [id number]  Take 1,000 mg by mouth 2 (two) times a week. [provider]  Active Self  aspirin  81 MG EC tablet [id number]  Take 1 tablet (81 mg total) by mouth daily. Johny Nap, NP  Active Self  blood glucose meter kit and supplies [id number]  Per insurance preference. Check daily fasting blood glucose. (Dx. E11.9). Sagardia, Miguel Jose, MD  Active   dapagliflozin  propanediol (FARXIGA ) 10 MG TABS tablet [id number] No Take 1 tablet (10 mg total) by mouth daily.  Patient not taking: Reported on 02/13/2024   Elvira Hammersmith, MD Not Taking Active   famotidine  (PEPCID ) 20 MG tablet [id number]  Take 1 tablet (20 mg total) by mouth 2 (two) times daily. Roemhildt, Lorin T, PA-C  Active   glipiZIDE  (GLUCOTROL ) 5 MG tablet [id number] Yes Take 1 tablet (5 mg total) by mouth 2 (two) times daily before a meal. Elvira Hammersmith, MD Taking Active   glucose blood (ACCU-CHEK GUIDE) test strip [id number]  Use as instructed Elvira Hammersmith, MD  Active   Melatonin 10 MG CAPS [id number]  Take by mouth. [provider]  Active   Omega-3 Fatty Acids (OMEGA-3 FISH OIL PO) [id number]  Take 1 capsule by mouth in the morning. [provider]  Active Self  oxybutynin  (DITROPAN ) 5 MG tablet [id number]  Take 1 tablet (5 mg total) by mouth every 8 (eight) hours as needed for bladder spasms.  Patient not taking: Reported on 01/20/2024   Sagardia, Miguel Jose, MD  Active   pregabalin  (LYRICA ) 50 MG capsule [id number]  TAKE 1 CAPSULE BY MOUTH 2 TIMES DAILY. Sagardia, Miguel Jose, MD  Active   traMADol  (ULTRAM ) 50 MG tablet [id number]  Take 1 tablet (50 mg total) by mouth every 6 (six) hours as needed (pain). Salina Craver, MD  Active               Assessment/Plan:   Diabetes: - Currently uncontrolled, A1c goal <7% - Her insurance only pays 50%  on drugs Tier 2 or higher. Checked with her pharmacy and even with a coupon, Farxiga  is $400 for 30 DS. No brand name medications will be an option for her due to cost. She does not qualify for PAP since she has Nurse, learning disability. - Recommend re-try metformin  ER 500 mg 2 tablets daily - Sent Rx for glucometer supplies   Follow Up Plan: PCP f/u 6/10  Rainelle Bur, PharmD, BCPS, CPP Clinical Pharmacist Practitioner [facility] [phone]

## 2024-02-13 NOTE — Patient Instructions (Signed)
 It was a pleasure speaking with you today!  Start metformin  ER 500 mg 2 tablets daily.  I have sent prescriptions for a new blood sugar monitor and supplies.  Feel free to call with any questions or concerns!  Rainelle Bur, PharmD, BCPS, CPP Clinical Pharmacist Practitioner [facility] at [facility] [phone]

## 2024-02-13 NOTE — Telephone Encounter (Signed)
 Spoke with patient regarding her referral request, informed her she needs to call her insurance and inquire on what locations are covered by her insurance then to let us  know. She did not understand what was being asking and thinks I was asking her to "change" her insurance. She states repetitively that has premium insurance and does not know why its not covered

## 2024-02-16 ENCOUNTER — Other Ambulatory Visit: Payer: Self-pay | Admitting: Emergency Medicine

## 2024-02-16 DIAGNOSIS — E1165 Type 2 diabetes mellitus with hyperglycemia: Secondary | ICD-10-CM

## 2024-02-17 ENCOUNTER — Ambulatory Visit: Admitting: Emergency Medicine

## 2024-03-03 ENCOUNTER — Telehealth: Payer: Self-pay | Admitting: Emergency Medicine

## 2024-03-03 NOTE — Telephone Encounter (Unsigned)
 Copied from CRM [id number]. Topic: Clinical - Medication Question >> Mar 03, 2024  2:17 PM Ernestene P wrote: Reason for CRM: Pt has a question about glipiZIDE  (GLUCOTROL ) 5 MG tablet , pt can be reached [phone]

## 2024-03-04 ENCOUNTER — Encounter: Payer: Self-pay | Admitting: Emergency Medicine

## 2024-03-04 ENCOUNTER — Ambulatory Visit (INDEPENDENT_AMBULATORY_CARE_PROVIDER_SITE_OTHER): Admitting: Emergency Medicine

## 2024-03-04 VITALS — BP 160/102 | HR 83 | Temp 98.9°F | Ht 65.0 in | Wt 231.0 lb

## 2024-03-04 DIAGNOSIS — Z130 Encounter for screening for diseases of the blood and blood-forming organs and certain disorders involving the immune mechanism: Secondary | ICD-10-CM | POA: Diagnosis not present

## 2024-03-04 DIAGNOSIS — Z0001 Encounter for general adult medical examination with abnormal findings: Secondary | ICD-10-CM | POA: Diagnosis not present

## 2024-03-04 DIAGNOSIS — E785 Hyperlipidemia, unspecified: Secondary | ICD-10-CM

## 2024-03-04 DIAGNOSIS — Z13228 Encounter for screening for other metabolic disorders: Secondary | ICD-10-CM | POA: Diagnosis not present

## 2024-03-04 DIAGNOSIS — E1169 Type 2 diabetes mellitus with other specified complication: Secondary | ICD-10-CM

## 2024-03-04 DIAGNOSIS — E1159 Type 2 diabetes mellitus with other circulatory complications: Secondary | ICD-10-CM | POA: Diagnosis not present

## 2024-03-04 DIAGNOSIS — M4726 Other spondylosis with radiculopathy, lumbar region: Secondary | ICD-10-CM

## 2024-03-04 DIAGNOSIS — Z1329 Encounter for screening for other suspected endocrine disorder: Secondary | ICD-10-CM

## 2024-03-04 DIAGNOSIS — I152 Hypertension secondary to endocrine disorders: Secondary | ICD-10-CM | POA: Diagnosis not present

## 2024-03-04 DIAGNOSIS — Z7984 Long term (current) use of oral hypoglycemic drugs: Secondary | ICD-10-CM

## 2024-03-04 LAB — COMPREHENSIVE METABOLIC PANEL WITH GFR
ALT: 24 U/L (ref 0–35)
AST: 17 U/L (ref 0–37)
Albumin: 4.4 g/dL (ref 3.5–5.2)
Alkaline Phosphatase: 65 U/L (ref 39–117)
BUN: 13 mg/dL (ref 6–23)
CO2: 33 meq/L — ABNORMAL HIGH (ref 19–32)
Calcium: 10.1 mg/dL (ref 8.4–10.5)
Chloride: 101 meq/L (ref 96–112)
Creatinine, Ser: 0.68 mg/dL (ref 0.40–1.20)
GFR: 93.65 mL/min (ref >60.00)
Glucose, Bld: 124 mg/dL — ABNORMAL HIGH (ref 70–99)
Potassium: 3.9 meq/L (ref 3.5–5.1)
Sodium: 140 meq/L (ref 135–145)
Total Bilirubin: 0.3 mg/dL (ref 0.2–1.2)
Total Protein: 7.5 g/dL (ref 6.0–8.3)

## 2024-03-04 LAB — CBC WITH DIFFERENTIAL/PLATELET
Basophils Absolute: 0 10*3/uL (ref 0.0–0.1)
Basophils Relative: 0.6 % (ref 0.0–3.0)
Eosinophils Absolute: 0 10*3/uL (ref 0.0–0.7)
Eosinophils Relative: 0.5 % (ref 0.0–5.0)
HCT: 40.7 % (ref 36.0–46.0)
Hemoglobin: 13.5 g/dL (ref 12.0–15.0)
Lymphocytes Relative: 38.1 % (ref 12.0–46.0)
Lymphs Abs: 2.5 10*3/uL (ref 0.7–4.0)
MCHC: 33.1 g/dL (ref 30.0–36.0)
MCV: 84 fl (ref 78.0–100.0)
Monocytes Absolute: 0.4 10*3/uL (ref 0.1–1.0)
Monocytes Relative: 5.8 % (ref 3.0–12.0)
Neutro Abs: 3.5 10*3/uL (ref 1.4–7.7)
Neutrophils Relative %: 55 % (ref 43.0–77.0)
Platelets: 237 10*3/uL (ref 150.0–400.0)
RBC: 4.85 Mil/uL (ref 3.87–5.11)
RDW: 13.8 % (ref 11.5–15.5)
WBC: 6.4 10*3/uL (ref 4.0–10.5)

## 2024-03-04 LAB — TSH: TSH: 1.13 u[IU]/mL (ref 0.35–5.50)

## 2024-03-04 LAB — MICROALBUMIN / CREATININE URINE RATIO
Creatinine,U: 27.5 mg/dL
Microalb Creat Ratio: UNDETERMINED mg/g (ref 0.0–30.0)
Microalb, Ur: <0.7 mg/dL

## 2024-03-04 LAB — LIPID PANEL
Cholesterol: 206 mg/dL — ABNORMAL HIGH (ref 0–200)
HDL: 69.5 mg/dL (ref >39.00)
LDL Cholesterol: 121 mg/dL — ABNORMAL HIGH (ref 0–99)
NonHDL: 136.42
Total CHOL/HDL Ratio: 3
Triglycerides: 77 mg/dL (ref 0.0–149.0)
VLDL: 15.4 mg/dL (ref 0.0–40.0)

## 2024-03-04 LAB — VITAMIN B12: Vitamin B-12: >1500 pg/mL — ABNORMAL HIGH (ref 211–911)

## 2024-03-04 LAB — VITAMIN D 25 HYDROXY (VIT D DEFICIENCY, FRACTURES): VITD: 36.53 ng/mL (ref 30.00–100.00)

## 2024-03-04 MED ORDER — HYDROCHLOROTHIAZIDE 12.5 MG PO TABS: 12.5000 mg | ORAL_TABLET | Freq: Every day | ORAL | 3 refills | Status: AC

## 2024-03-04 MED ORDER — AMLODIPINE BESYLATE 5 MG PO TABS
5.0000 mg | ORAL_TABLET | Freq: Every day | ORAL | 3 refills | Status: DC
Start: 2024-03-04 — End: 2024-07-22

## 2024-03-04 NOTE — Assessment & Plan Note (Signed)
 BP Readings from Last 3 Encounters:  03/04/24 (!) 160/102  01/20/24 (!) 144/84  11/19/23 122/82  Persistently elevated blood pressure readings Continue amlodipine  5 mg and start hydrochlorothiazide  12.5 mg daily Cardiovascular risks associated with uncontrolled hypertension discussed Hemoglobin A1c better than before 7.7. Continue taking glipizide  5 mg twice a day and continue Farxiga  10 mg daily Diet and nutrition discussed Follow-up in 3 months.

## 2024-03-04 NOTE — Assessment & Plan Note (Signed)
 Significant degenerative lumbar spine and disc disease as seen on MRI done last December Was already evaluated by sports medicine Referred for spinal injections but not helping much Needs evaluation by spinal surgeon Referral placed today.

## 2024-03-04 NOTE — Assessment & Plan Note (Signed)
 Diet and nutrition discussed benefit Benefits of exercise discussed Advised to decrease amount of daily carbohydrate intake and daily calories and increase amount of plant-based protein in her diet Follow-up in 6 months

## 2024-03-04 NOTE — Assessment & Plan Note (Signed)
 Improved hemoglobin A1c at 7.7 Continue glipizide  5 mg twice a day and Farxiga  10 mg daily Intolerant to statins

## 2024-03-04 NOTE — Patient Instructions (Signed)

## 2024-03-04 NOTE — Progress Notes (Signed)
 Lindsay Travis 63 y.o.   Chief Complaint  Patient presents with   Annual Exam    Patient here for physical. Patient states that the referral that was last sent is not covered by her insurace but there is two doctors at that facility that takes her insurance and wants to know who provider suggest, Kyle L. Gillie, MD or Serita CANDIE Brasil, MD.    HISTORY OF PRESENT ILLNESS: This is a 63 y.o. female here for annual exam and follow-up of other chronic medical conditions including hypertension. Continues to struggle with chronic lumbar pain radiating to both legs.  Requesting referral to physical therapy. Has noted some edema to ankles and feet. No other complaints or medical concerns today. BP Readings from Last 3 Encounters:  01/20/24 (!) 144/84  11/19/23 122/82  10/13/23 138/88     HPI   Prior to Admission medications   Medication Sig Start Date End Date Taking? Authorizing Provider  amLODipine  (NORVASC ) 5 MG tablet Take 1 tablet (5 mg total) by mouth daily. 11/21/23   Purcell Emil Schanz, MD  Ascorbic Acid (VITAMIN C) 1000 MG tablet Take 1,000 mg by mouth 2 (two) times a week.    [provider]  aspirin  81 MG EC tablet Take 1 tablet (81 mg total) by mouth daily. 03/20/18   Whitfield Raisin, NP  blood glucose meter kit and supplies Per insurance preference. Check daily fasting blood glucose. (Dx. E11.9). 03/20/22   Tyresa Prindiville Jose, MD  Blood Glucose Monitoring Suppl DEVI 1 each by Does not apply route daily. May substitute to any manufacturer covered by patient's insurance. 02/13/24   Keller Bounds Jose, MD  famotidine  (PEPCID ) 20 MG tablet Take 1 tablet (20 mg total) by mouth 2 (two) times daily. 11/05/22   Roemhildt, Lorin T, PA-C  glipiZIDE  (GLUCOTROL ) 5 MG tablet TAKE 1 TABLET (5 MG TOTAL) BY MOUTH TWICE A DAY BEFORE MEALS 02/16/24   Skylyn Slezak, Emil Schanz, MD  Glucose Blood (BLOOD GLUCOSE TEST STRIPS) STRP 1 each by In Vitro route daily at 6 (six) AM. May substitute to any  manufacturer covered by patient's insurance. 02/13/24   Purcell Emil Schanz, MD  Lancet Device MISC 1 each by Does not apply route daily at 6 (six) AM. May substitute to any manufacturer covered by patient's insurance. 02/13/24 03/14/24  Purcell Emil Schanz, MD  Lancets Misc. MISC 1 each by Does not apply route daily at 6 (six) AM. May substitute to any manufacturer covered by patient's insurance. 02/13/24   Jimmye Wisnieski Jose, MD  Melatonin 10 MG CAPS Take by mouth.    [provider]  metFORMIN  (GLUCOPHAGE -XR) 500 MG 24 hr tablet Take 2 tablets (1,000 mg total) by mouth daily with breakfast. 02/13/24   Kryslyn Helbig, Emil Schanz, MD  Omega-3 Fatty Acids (OMEGA-3 FISH OIL PO) Take 1 capsule by mouth in the morning.    [provider]  oxybutynin  (DITROPAN ) 5 MG tablet Take 1 tablet (5 mg total) by mouth every 8 (eight) hours as needed for bladder spasms. Patient not taking: Reported on 03/04/2024 06/25/23   Purcell Emil Schanz, MD  pregabalin  (LYRICA ) 50 MG capsule TAKE 1 CAPSULE BY MOUTH 2 TIMES DAILY. 02/10/24   Purcell Emil Schanz, MD  traMADol  (ULTRAM ) 50 MG tablet Take 1 tablet (50 mg total) by mouth every 6 (six) hours as needed (pain). 10/13/23   Cleatrice Ludie SAUNDERS, MD    Allergies  Allergen Reactions   Lisinopril Other (See Comments)    Did not feel  good   Losartan  Other (See Comments)    headache   Aspirin  Other (See Comments)    Abdominal pain only 325 mg     Patient Active Problem List   Diagnosis Date Noted   Neuropathic pain 11/19/2023   Bilateral sciatica 11/19/2023   Osteoarthritis of spine with radiculopathy, lumbar region 11/19/2023   Bilateral hip pain 08/28/2023   Overactive bladder 06/25/2023   Dyslipidemia associated with type 2 diabetes mellitus (HCC) 06/19/2022   Statin myopathy 08/01/2021   Obesity, diabetes, and hypertension syndrome (HCC) 01/09/2021   Morbid obesity (HCC) 09/18/2019   Dyslipidemia    Type 2 diabetes mellitus with hyperglycemia,  without long-term current use of insulin  (HCC) 08/07/2015   Varicose veins of bilateral lower extremities with other complications 02/24/2012   Uterine leiomyoma 04/18/2011   Hypertension associated with diabetes (HCC) 04/18/2011    Past Medical History:  Diagnosis Date   Anemia    Arthritis    bilateral kneed and lower legs   Clotting disorder (HCC)    Diabetes mellitus    recent high blood sugar and has RX but not taking  med -b/c she doesn't think she  is diabetic, just had lots of sugar in her diet when dx'd.-on meds   Hx of adenomatous polyp of colon 07/2021   Repeat colonoscopy 2029   Hypertension    on meds   Shortness of breath    on exertion- has low hgb   Sickle cell trait [facility])     Past Surgical History:  Procedure Laterality Date   ABDOMINAL HYSTERECTOMY  09/11/2011   Procedure: HYSTERECTOMY ABDOMINAL;  Surgeon: Aida DELENA Na, MD;  Location: [facility];  Service: Gynecology;  Laterality: N/A;   COLONOSCOPY WITH PROPOFOL  N/A 07/16/2021   Procedure: COLONOSCOPY WITH PROPOFOL ;  Surgeon: Avram Lupita BRAVO, MD;  Location: [facility];  Service: Endoscopy;  Laterality: N/A;   POLYPECTOMY  07/16/2021   Procedure: POLYPECTOMY;  Surgeon: Avram Lupita BRAVO, MD;  Location: [facility];  Service: Endoscopy;;    Social History   Socioeconomic History   Marital status: Divorced    Spouse name: Not on file   Number of children: Not on file   Years of education: Not on file   Highest education level: Not on file  Occupational History   Not on file  Tobacco Use   Smoking status: Never   Smokeless tobacco: Never  Vaping Use   Vaping status: Never Used  Substance and Sexual Activity   Alcohol use: No   Drug use: No   Sexual activity: Never  Other Topics Concern   Not on file  Social History Narrative   Not on file   Social Drivers of Health   Financial Resource Strain: Not on file  Food Insecurity: Not on file  Transportation Needs: Not on file  Physical Activity:  Not on file  Stress: Not on file  Social Connections: Not on file  Intimate Partner Violence: Not on file    Family History  Problem Relation Age of Onset   Hypertension Other    Colon polyps Neg Hx    Colon cancer Neg Hx    Heart disease Neg Hx    Esophageal cancer Neg Hx    Rectal cancer Neg Hx    Stomach cancer Neg Hx      Review of Systems  Constitutional: Negative.  Negative for chills and fever.  HENT: Negative.  Negative for congestion and sore throat.   Respiratory: Negative.  Negative for  cough and shortness of breath.   Cardiovascular: Negative.  Negative for chest pain and palpitations.  Gastrointestinal:  Negative for abdominal pain, diarrhea, nausea and vomiting.  Genitourinary: Negative.  Negative for dysuria and hematuria.  Musculoskeletal:  Positive for back pain.  Skin: Negative.  Negative for rash.  Neurological: Negative.  Negative for dizziness and headaches.  All other systems reviewed and are negative.   Today's Vitals   03/04/24 1412  BP: (!) 160/102  Pulse: 83  Temp: 98.9 F (37.2 C)  TempSrc: Oral  SpO2: 94%  Weight: 231 lb (104.8 kg)  Height: 5' 5 (1.651 m)   Body mass index is 38.44 kg/m.   Physical Exam Vitals reviewed.  Constitutional:      Appearance: Normal appearance. She is obese.  HENT:     Head: Normocephalic.     Mouth/Throat:     Mouth: Mucous membranes are moist.     Pharynx: Oropharynx is clear.   Eyes:     Extraocular Movements: Extraocular movements intact.     Conjunctiva/sclera: Conjunctivae normal.     Pupils: Pupils are equal, round, and reactive to light.    Cardiovascular:     Rate and Rhythm: Normal rate and regular rhythm.     Pulses: Normal pulses.     Heart sounds: Normal heart sounds.  Pulmonary:     Effort: Pulmonary effort is normal.     Breath sounds: Normal breath sounds.  Abdominal:     Palpations: Abdomen is soft.     Tenderness: There is no abdominal tenderness.   Musculoskeletal:      Cervical back: No tenderness.     Comments: Edema of ankles and feet  Lymphadenopathy:     Cervical: No cervical adenopathy.   Skin:    General: Skin is warm and dry.     Capillary Refill: Capillary refill takes less than 2 seconds.   Neurological:     General: No focal deficit present.     Mental Status: She is alert and oriented to person, place, and time.   Psychiatric:        Mood and Affect: Mood normal.        Behavior: Behavior normal.      ASSESSMENT & PLAN: Problem List Items Addressed This Visit       Cardiovascular and Mediastinum   Hypertension associated with diabetes (HCC)   BP Readings from Last 3 Encounters:  03/04/24 (!) 160/102  01/20/24 (!) 144/84  11/19/23 122/82  Persistently elevated blood pressure readings Continue amlodipine  5 mg and start hydrochlorothiazide  12.5 mg daily Cardiovascular risks associated with uncontrolled hypertension discussed Hemoglobin A1c better than before 7.7. Continue taking glipizide  5 mg twice a day and continue Farxiga  10 mg daily Diet and nutrition discussed Follow-up in 3 months.       Relevant Medications   amLODipine  (NORVASC ) 5 MG tablet   hydrochlorothiazide  (HYDRODIURIL ) 12.5 MG tablet   Other Relevant Orders   Comprehensive metabolic panel with GFR   Microalbumin / creatinine urine ratio     Endocrine   Dyslipidemia associated with type 2 diabetes mellitus (HCC)   Improved hemoglobin A1c at 7.7 Continue glipizide  5 mg twice a day and Farxiga  10 mg daily Intolerant to statins      Relevant Orders   Comprehensive metabolic panel with GFR   Lipid panel   Microalbumin / creatinine urine ratio     Nervous and Auditory   Osteoarthritis of spine with radiculopathy, lumbar region   Significant degenerative  lumbar spine and disc disease as seen on MRI done last December Was already evaluated by sports medicine Referred for spinal injections but not helping much Needs evaluation by spinal  surgeon Referral placed today.      Relevant Orders   Ambulatory referral to Physical Therapy     Other   Morbid obesity (HCC)   Diet and nutrition discussed benefit Benefits of exercise discussed Advised to decrease amount of daily carbohydrate intake and daily calories and increase amount of plant-based protein in her diet Follow-up in 6 months      Relevant Orders   Comprehensive metabolic panel with GFR   Lipid panel   TSH   VITAMIN D 25 Hydroxy (Vit-D Deficiency, Fractures)   Vitamin B12   Other Visit Diagnoses       Encounter for general adult medical examination with abnormal findings    -  Primary   Relevant Orders   CBC with Differential/Platelet   Comprehensive metabolic panel with GFR   Lipid panel   TSH   VITAMIN D 25 Hydroxy (Vit-D Deficiency, Fractures)   Vitamin B12   Microalbumin / creatinine urine ratio     Screening for deficiency anemia       Relevant Orders   CBC with Differential/Platelet     Screening for endocrine, metabolic and immunity disorder       Relevant Orders   Comprehensive metabolic panel with GFR   TSH   VITAMIN D 25 Hydroxy (Vit-D Deficiency, Fractures)   Vitamin B12      Modifiable risk factors discussed with patient. Anticipatory guidance according to age provided. The following topics were also discussed: Social Determinants of Health Smoking.  Non-smoker Diet and nutrition Benefits of exercise Cancer screening and review of most recent mammogram and colonoscopy reports Vaccinations review and recommendations Cardiovascular risk assessment Review of multiple chronic medical conditions and their management Review of all medications and changes made Mental health including depression and anxiety Fall and accident prevention  Patient Instructions  Health Maintenance, Female Adopting a healthy lifestyle and getting preventive care are important in promoting health and wellness. Ask your health care provider about: The  right schedule for you to have regular tests and exams. Things you can do on your own to prevent diseases and keep yourself healthy. What should I know about diet, weight, and exercise? Eat a healthy diet  Eat a diet that includes plenty of vegetables, fruits, low-fat dairy products, and lean protein. Do not eat a lot of foods that are high in solid fats, added sugars, or sodium. Maintain a healthy weight Body mass index (BMI) is used to identify weight problems. It estimates body fat based on height and weight. Your health care provider can help determine your BMI and help you achieve or maintain a healthy weight. Get regular exercise Get regular exercise. This is one of the most important things you can do for your health. Most adults should: Exercise for at least 150 minutes each week. The exercise should increase your heart rate and make you sweat (moderate-intensity exercise). Do strengthening exercises at least twice a week. This is in addition to the moderate-intensity exercise. Spend less time sitting. Even light physical activity can be beneficial. Watch cholesterol and blood lipids Have your blood tested for lipids and cholesterol at 62 years of age, then have this test every 5 years. Have your cholesterol levels checked more often if: Your lipid or cholesterol levels are high. You are older than 62 years of age.  You are at high risk for heart disease. What should I know about cancer screening? Depending on your health history and family history, you may need to have cancer screening at various ages. This may include screening for: Breast cancer. Cervical cancer. Colorectal cancer. Skin cancer. Lung cancer. What should I know about heart disease, diabetes, and high blood pressure? Blood pressure and heart disease High blood pressure causes heart disease and increases the risk of stroke. This is more likely to develop in people who have high blood pressure readings or are  overweight. Have your blood pressure checked: Every 3-5 years if you are 70-17 years of age. Every year if you are 63 years old or older. Diabetes Have regular diabetes screenings. This checks your fasting blood sugar level. Have the screening done: Once every three years after age 2 if you are at a normal weight and have a low risk for diabetes. More often and at a younger age if you are overweight or have a high risk for diabetes. What should I know about preventing infection? Hepatitis B If you have a higher risk for hepatitis B, you should be screened for this virus. Talk with your health care provider to find out if you are at risk for hepatitis B infection. Hepatitis C Testing is recommended for: Everyone born from [birth year] through 1965. Anyone with known risk factors for hepatitis C. Sexually transmitted infections (STIs) Get screened for STIs, including gonorrhea and chlamydia, if: You are sexually active and are younger than 62 years of age. You are older than 62 years of age and your health care provider tells you that you are at risk for this type of infection. Your sexual activity has changed since you were last screened, and you are at increased risk for chlamydia or gonorrhea. Ask your health care provider if you are at risk. Ask your health care provider about whether you are at high risk for HIV. Your health care provider may recommend a prescription medicine to help prevent HIV infection. If you choose to take medicine to prevent HIV, you should first get tested for HIV. You should then be tested every 3 months for as long as you are taking the medicine. Pregnancy If you are about to stop having your period (premenopausal) and you may become pregnant, seek counseling before you get pregnant. Take 400 to 800 micrograms (mcg) of folic acid  every day if you become pregnant. Ask for birth control (contraception) if you want to prevent pregnancy. Osteoporosis and  menopause Osteoporosis is a disease in which the bones lose minerals and strength with aging. This can result in bone fractures. If you are 59 years old or older, or if you are at risk for osteoporosis and fractures, ask your health care provider if you should: Be screened for bone loss. Take a calcium  or vitamin D supplement to lower your risk of fractures. Be given hormone replacement therapy (HRT) to treat symptoms of menopause. Follow these instructions at home: Alcohol use Do not drink alcohol if: Your health care provider tells you not to drink. You are pregnant, may be pregnant, or are planning to become pregnant. If you drink alcohol: Limit how much you have to: 0-1 drink a day. Know how much alcohol is in your drink. In the [place] one drink equals one 12 oz bottle of beer (355 mL), one 5 oz glass of wine (148 mL), or one 1 oz glass of hard liquor (44 mL). Lifestyle Do not use any products that  contain nicotine or tobacco. These products include cigarettes, chewing tobacco, and vaping devices, such as e-cigarettes. If you need help quitting, ask your health care provider. Do not use street drugs. Do not share needles. Ask your health care provider for help if you need support or information about quitting drugs. General instructions Schedule regular health, dental, and eye exams. Stay current with your vaccines. Tell your health care provider if: You often feel depressed. You have ever been abused or do not feel safe at home. Summary Adopting a healthy lifestyle and getting preventive care are important in promoting health and wellness. Follow your health care provider's instructions about healthy diet, exercising, and getting tested or screened for diseases. Follow your health care provider's instructions on monitoring your cholesterol and blood pressure. This information is not intended to replace advice given to you by your health care provider. Make sure you discuss any  questions you have with your health care provider. Document Revised: 01/15/2021 Document Reviewed: 01/15/2021 Elsevier Patient Education  2024 Elsevier Inc.      Emil Schaumann, MD [facility]

## 2024-03-05 ENCOUNTER — Ambulatory Visit: Payer: Self-pay | Admitting: Emergency Medicine

## 2024-03-05 ENCOUNTER — Other Ambulatory Visit: Payer: Self-pay | Admitting: Emergency Medicine

## 2024-03-05 DIAGNOSIS — E1169 Type 2 diabetes mellitus with other specified complication: Secondary | ICD-10-CM

## 2024-03-05 MED ORDER — ROSUVASTATIN CALCIUM 10 MG PO TABS: 10.0000 mg | ORAL_TABLET | Freq: Every day | ORAL | 3 refills | Status: AC

## 2024-03-08 ENCOUNTER — Other Ambulatory Visit: Payer: Self-pay | Admitting: Emergency Medicine

## 2024-03-08 DIAGNOSIS — E1169 Type 2 diabetes mellitus with other specified complication: Secondary | ICD-10-CM

## 2024-03-08 MED ORDER — EZETIMIBE 10 MG PO TABS: 10.0000 mg | ORAL_TABLET | Freq: Every day | ORAL | 3 refills | Status: AC

## 2024-03-08 NOTE — Progress Notes (Signed)
 Not on her list of allergies or medication contraindication.  Please make a note of that for future references. New prescription for Zetia  10 mg daily sent to pharmacy of record today.

## 2024-03-09 ENCOUNTER — Encounter: Payer: Self-pay | Admitting: Radiology

## 2024-03-26 ENCOUNTER — Other Ambulatory Visit: Payer: Self-pay

## 2024-03-26 ENCOUNTER — Emergency Department (HOSPITAL_COMMUNITY)
Admission: EM | Admit: 2024-03-26 | Discharge: 2024-03-26 | Disposition: A | Discharge status: Home / Self Care | Attending: Emergency Medicine | Admitting: Emergency Medicine

## 2024-03-26 ENCOUNTER — Ambulatory Visit: Payer: Self-pay

## 2024-03-26 ENCOUNTER — Emergency Department (HOSPITAL_COMMUNITY)

## 2024-03-26 ENCOUNTER — Encounter (HOSPITAL_COMMUNITY): Payer: Self-pay | Admitting: *Deleted

## 2024-03-26 DIAGNOSIS — Z79899 Other long term (current) drug therapy: Secondary | ICD-10-CM | POA: Insufficient documentation

## 2024-03-26 DIAGNOSIS — E1165 Type 2 diabetes mellitus with hyperglycemia: Secondary | ICD-10-CM | POA: Diagnosis not present

## 2024-03-26 DIAGNOSIS — Z7982 Long term (current) use of aspirin: Secondary | ICD-10-CM | POA: Diagnosis not present

## 2024-03-26 DIAGNOSIS — Z794 Long term (current) use of insulin: Secondary | ICD-10-CM | POA: Insufficient documentation

## 2024-03-26 DIAGNOSIS — M79662 Pain in left lower leg: Secondary | ICD-10-CM | POA: Diagnosis present

## 2024-03-26 DIAGNOSIS — L03116 Cellulitis of left lower limb: Secondary | ICD-10-CM | POA: Diagnosis not present

## 2024-03-26 DIAGNOSIS — Z7984 Long term (current) use of oral hypoglycemic drugs: Secondary | ICD-10-CM | POA: Diagnosis not present

## 2024-03-26 DIAGNOSIS — I10 Essential (primary) hypertension: Secondary | ICD-10-CM | POA: Diagnosis not present

## 2024-03-26 DIAGNOSIS — D72829 Elevated white blood cell count, unspecified: Secondary | ICD-10-CM | POA: Insufficient documentation

## 2024-03-26 LAB — CBC WITH DIFFERENTIAL/PLATELET
Abs Immature Granulocytes: 0.07 K/uL (ref 0.00–0.07)
Basophils Absolute: 0 K/uL (ref 0.0–0.1)
Basophils Relative: 0 %
Eosinophils Absolute: 0 K/uL (ref 0.0–0.5)
Eosinophils Relative: 0 %
HCT: 38.1 % (ref 36.0–46.0)
Hemoglobin: 12.8 g/dL (ref 12.0–15.0)
Immature Granulocytes: 1 %
Lymphocytes Relative: 12 %
Lymphs Abs: 1.4 K/uL (ref 0.7–4.0)
MCH: 28.5 pg (ref 26.0–34.0)
MCHC: 33.6 g/dL (ref 30.0–36.0)
MCV: 84.9 fL (ref 80.0–100.0)
Monocytes Absolute: 0.6 K/uL (ref 0.1–1.0)
Monocytes Relative: 5 %
Neutro Abs: 10.2 K/uL — ABNORMAL HIGH (ref 1.7–7.7)
Neutrophils Relative %: 82 %
Platelets: 167 K/uL (ref 150–400)
RBC: 4.49 MIL/uL (ref 3.87–5.11)
RDW: 13.4 % (ref 11.5–15.5)
WBC: 12.3 K/uL — ABNORMAL HIGH (ref 4.0–10.5)
nRBC: 0 % (ref 0.0–0.2)

## 2024-03-26 LAB — COMPREHENSIVE METABOLIC PANEL WITH GFR
ALT: 21 U/L (ref 0–44)
AST: 16 U/L (ref 15–41)
Albumin: 3.4 g/dL — ABNORMAL LOW (ref 3.5–5.0)
Alkaline Phosphatase: 56 U/L (ref 38–126)
Anion gap: 10 (ref 5–15)
BUN: 10 mg/dL (ref 8–23)
CO2: 21 mmol/L — ABNORMAL LOW (ref 22–32)
Calcium: 9.2 mg/dL (ref 8.9–10.3)
Chloride: 101 mmol/L (ref 98–111)
Creatinine, Ser: 0.87 mg/dL (ref 0.44–1.00)
GFR, Estimated: >60 mL/min (ref >60)
Glucose, Bld: 363 mg/dL — ABNORMAL HIGH (ref 70–99)
Potassium: 3.6 mmol/L (ref 3.5–5.1)
Sodium: 132 mmol/L — ABNORMAL LOW (ref 135–145)
Total Bilirubin: 0.9 mg/dL (ref 0.0–1.2)
Total Protein: 6.5 g/dL (ref 6.5–8.1)

## 2024-03-26 MED ORDER — CEPHALEXIN 500 MG PO CAPS: 500.0000 mg | ORAL_CAPSULE | Freq: Four times a day (QID) | ORAL | 0 refills | Status: AC

## 2024-03-26 NOTE — ED Provider Triage Note (Signed)
 Emergency Medicine Provider Triage Evaluation Note  Lindsay Travis , a 63 y.o. female  was evaluated in triage.  Pt complains of leg erythema/pain.  Review of Systems  Positive:  Negative:   Physical Exam  LMP 09/10/2011  Gen:   Awake, no distress   Resp:  Normal effort  MSK:   Moves extremities without difficulty  Other:    Medical Decision Making  Medically screening exam initiated at 2:00 PM.  Appropriate orders placed.  Lindsay Travis was informed that the remainder of the evaluation will be completed by another provider, this initial triage assessment does not replace that evaluation, and the importance of remaining in the ED until their evaluation is complete.  Patient noticing erythematous, swollen, and painful left lower leg this morning.  Also endorses some nausea.  Denies pruritus.  Also states that she is pain in her left groin.  Denies sunburn.  Denies fever, chest pain, SOB, vomiting, diarrhea.   Lindsay Travis, [place] 03/26/24 1401

## 2024-03-26 NOTE — ED Triage Notes (Signed)
 Pt coming in with swelling of left leg pt has redness at left knee. Pt also reporting pain in left groin.

## 2024-03-26 NOTE — ED Provider Notes (Signed)
 [facility] Provider Note   CSN: [id number] Arrival date & time: 03/26/24  1346   Patient presents with: left leg swelling  Lindsay Travis is a 63 y.o. female with PMHx of T2DM, bilateral hip pain/sciatica, HLD, and overactive bladder who presents for evaluation of left lower leg redness, warmth, pain, and swelling that began last night. She states she was at her baseline level of health all day yesterday but around the evening began to feel generally malaised and noticed her LLE was red and tender. As the evening and today progressed, the symptoms worsened and her LLE became more swollen compared to the right. She also noticed a tender swelling in her left groin, but no skin lesion or any atypical vaginal discharge or abdominal pain. She denies any recent insect bites, injuries to the LLE, or prior DVT/PE.  Patient also states that she checked her CBG earlier today and it was 380, states it is normally between 140 and 180 at baseline, is on only metformin  currently and has never used insulin  previously.   Prior to Admission medications   Medication Sig Start Date End Date Taking? Authorizing Provider  cephALEXin  (KEFLEX ) 500 MG capsule Take 1 capsule (500 mg total) by mouth 4 (four) times daily for 7 days. 03/26/24 04/02/24 Yes Raoul Rake, MD  amLODipine  (NORVASC ) 5 MG tablet Take 1 tablet (5 mg total) by mouth daily. 03/04/24   Sagardia, Miguel Jose, MD  Ascorbic Acid (VITAMIN C) 1000 MG tablet Take 1,000 mg by mouth 2 (two) times a week.    [provider]  aspirin  81 MG EC tablet Take 1 tablet (81 mg total) by mouth daily. 03/20/18   Whitfield Raisin, NP  blood glucose meter kit and supplies Per insurance preference. Check daily fasting blood glucose. (Dx. E11.9). 03/20/22   Sagardia, Miguel Jose, MD  Blood Glucose Monitoring Suppl DEVI 1 each by Does not apply route daily. May substitute to any manufacturer covered by patient's  insurance. 02/13/24   Sagardia, Miguel Jose, MD  ezetimibe  (ZETIA ) 10 MG tablet Take 1 tablet (10 mg total) by mouth daily. 03/08/24   Sagardia, Miguel Jose, MD  famotidine  (PEPCID ) 20 MG tablet Take 1 tablet (20 mg total) by mouth 2 (two) times daily. 11/05/22   Roemhildt, Lorin T, PA-C  glipiZIDE  (GLUCOTROL ) 5 MG tablet TAKE 1 TABLET (5 MG TOTAL) BY MOUTH TWICE A DAY BEFORE MEALS 02/16/24   Sagardia, Emil Schanz, MD  Glucose Blood (BLOOD GLUCOSE TEST STRIPS) STRP 1 each by In Vitro route daily at 6 (six) AM. May substitute to any manufacturer covered by patient's insurance. 02/13/24   Purcell Emil Schanz, MD  hydrochlorothiazide  (HYDRODIURIL ) 12.5 MG tablet Take 1 tablet (12.5 mg total) by mouth daily. 03/04/24   Purcell Emil Schanz, MD  Lancets Misc. MISC 1 each by Does not apply route daily at 6 (six) AM. May substitute to any manufacturer covered by patient's insurance. 02/13/24   Sagardia, Miguel Jose, MD  Melatonin 10 MG CAPS Take by mouth.    [provider]  metFORMIN  (GLUCOPHAGE -XR) 500 MG 24 hr tablet Take 2 tablets (1,000 mg total) by mouth daily with breakfast. 02/13/24   Sagardia, Emil Schanz, MD  Omega-3 Fatty Acids (OMEGA-3 FISH OIL PO) Take 1 capsule by mouth in the morning.    [provider]  oxybutynin  (DITROPAN ) 5 MG tablet Take 1 tablet (5 mg total) by mouth every 8 (eight) hours as needed for bladder spasms. Patient not  taking: Reported on 03/04/2024 06/25/23   Purcell Emil Schanz, MD  pregabalin  (LYRICA ) 50 MG capsule TAKE 1 CAPSULE BY MOUTH 2 TIMES DAILY. 02/10/24   Purcell Emil Schanz, MD  rosuvastatin  (CRESTOR ) 10 MG tablet Take 1 tablet (10 mg total) by mouth daily. 03/05/24   Sagardia, Miguel Jose, MD  traMADol  (ULTRAM ) 50 MG tablet Take 1 tablet (50 mg total) by mouth every 6 (six) hours as needed (pain). 10/13/23   Cleatrice Ludie SAUNDERS, MD    Allergies: Lisinopril, Losartan , Rosuvastatin , and Aspirin     Review of Systems  Updated Vital Signs BP 130/69 (BP  Location: Right Arm)   Pulse 85   Temp 99.3 F (37.4 C) (Oral)   Resp 16   Ht 5' 6 (1.676 m)   Wt 113.4 kg   LMP 09/10/2011   SpO2 99%   BMI 40.35 kg/m   Physical Exam Vitals reviewed.  Constitutional:      General: She is not in acute distress.    Appearance: She is not toxic-appearing or diaphoretic.  HENT:     Head: Normocephalic and atraumatic.     Nose: Nose normal.     Mouth/Throat:     Mouth: Mucous membranes are moist.     Pharynx: Oropharynx is clear. No oropharyngeal exudate.  Eyes:     Extraocular Movements: Extraocular movements intact.     Conjunctiva/sclera: Conjunctivae normal.  Cardiovascular:     Rate and Rhythm: Normal rate and regular rhythm.     Pulses:          Radial pulses are 2+ on the right side and 2+ on the left side.       Dorsalis pedis pulses are 2+ on the right side and 2+ on the left side.     Heart sounds: No murmur heard.    No gallop.  Pulmonary:     Effort: Pulmonary effort is normal. No respiratory distress.     Breath sounds: Normal breath sounds.  Abdominal:     General: Abdomen is flat.     Palpations: Abdomen is soft.     Tenderness: There is no abdominal tenderness. There is no guarding.     Hernia: No hernia is present.  Genitourinary:    General: Normal vulva.  Musculoskeletal:        General: Normal range of motion.     Cervical back: Normal range of motion.     Comments: Red discoloration of the LLE with clear demarcated borders below the L knee extending down to the L ankle, area is tender and warm to the touch with trace pitting edema. RLE is normal in appearance and non-edematous.  Lymphadenopathy:     Lower Body: No right inguinal adenopathy. Left inguinal adenopathy (shotty) present.  Skin:    General: Skin is warm and dry.     Capillary Refill: Capillary refill takes less than 2 seconds.  Neurological:     Mental Status: She is alert and oriented to person, place, and time. Mental status is at baseline.      Sensory: No sensory deficit.     Motor: No weakness.     (all labs ordered are listed, but only abnormal results are displayed) Labs Reviewed  CBC WITH DIFFERENTIAL/PLATELET - Abnormal; Notable for the following components:      Result Value   WBC 12.3 (*)    Neutro Abs 10.2 (*)    All other components within normal limits  COMPREHENSIVE METABOLIC PANEL WITH GFR - Abnormal; Notable  for the following components:   Sodium 132 (*)    CO2 21 (*)    Glucose, Bld 363 (*)    Albumin 3.4 (*)    All other components within normal limits    EKG: None  Radiology: VAS US  LOWER EXTREMITY VENOUS (DVT) (7a-7p) Result Date: 03/26/2024  Lower Venous DVT Study Patient Name:  ADELITA HONE  Date of Exam:   03/26/2024 Medical Rec #: [id number]        Accession #:    [id number] Date of Birth: [birth year]        Patient Gender: F Patient Age:   42 years Exam Location:  [facility] Procedure:      VAS US  LOWER EXTREMITY VENOUS (DVT) Referring Phys: [facility] --------------------------------------------------------------------------------  Indications: Pain, and Erythema.  Limitations: Poor ultrasound/tissue interface. Comparison Study: No previous exams on file Performing Technologist: Jody Hill RVT, RDMS  Examination Guidelines: A complete evaluation includes B-mode imaging, spectral Doppler, color Doppler, and power Doppler as needed of all accessible portions of each vessel. Bilateral testing is considered an integral part of a complete examination. Limited examinations for reoccurring indications may be performed as noted. The reflux portion of the exam is performed with the patient in reverse Trendelenburg.  +-----+---------------+---------+-----------+----------+--------------+ RIGHTCompressibilityPhasicitySpontaneityPropertiesThrombus Aging +-----+---------------+---------+-----------+----------+--------------+ CFV  Full           Yes      Yes                                  +-----+---------------+---------+-----------+----------+--------------+   +---------+---------------+---------+-----------+----------+--------------+ LEFT     CompressibilityPhasicitySpontaneityPropertiesThrombus Aging +---------+---------------+---------+-----------+----------+--------------+ CFV      Full           Yes      Yes                                 +---------+---------------+---------+-----------+----------+--------------+ SFJ      Full                                                        +---------+---------------+---------+-----------+----------+--------------+ FV Prox  Full           Yes      Yes                                 +---------+---------------+---------+-----------+----------+--------------+ FV Mid   Full           Yes      Yes                                 +---------+---------------+---------+-----------+----------+--------------+ FV DistalFull           Yes      Yes                                 +---------+---------------+---------+-----------+----------+--------------+ PFV      Full                                                        +---------+---------------+---------+-----------+----------+--------------+  POP      Full           Yes      Yes                                 +---------+---------------+---------+-----------+----------+--------------+ PTV      Full                                                        +---------+---------------+---------+-----------+----------+--------------+ PERO     Full                                                        +---------+---------------+---------+-----------+----------+--------------+     Summary: RIGHT: - No evidence of common femoral vein obstruction.   LEFT: - There is no evidence of deep vein thrombosis in the lower extremity.  - No cystic structure found in the popliteal fossa.  *See table(s) above for measurements and observations.    Preliminary      Medications Ordered in the ED - No data to display  Clinical Course as of 03/27/24 0052  Fri Mar 26, 2024  1739 WBC(!): 12.3 Mild leukocytosis overall but notable increase from 6.4 three weeks ago. Otherwise unremarkable CBC [AD]  1739 Comprehensive metabolic panel(!) Glucose elevated to 363 from her usual baseline 140-180s as non-insulin   dependent DM2. Otherwise unremarkable. [AD]  1740 VAS US  LOWER EXTREMITY VENOUS (DVT) (7a-7p) No acute DVT [AD]    Clinical Course User Index [AD] Raoul Rake, MD    Medical Decision Making Patient with the above history is presenting with 1 day of LLE redness, warmth, pain, and swelling with mild malaise, shotty left inguinal LAD, and hyperglycemia to 380 which is atypical for her baseline. She has no prior history of DVT/PE. Her LLE is neurovascularly intact distally.   Her presentation is most concerning for acute cellulitis of the LLE given the concomitant mild left inguinal LAD and elevated BG from her usual normal baseline and systemic symptoms of mild malaise. Lower extremity U/S negative today for acute DVT. Workup otherwise remarkable for leukocytosis to 12.3 from recent prior of ~6 a few weeks ago, and elevated BG to 360s. Will plan to discharge with keflex  for outpatient management of acute cellulitis. Patient updated on results above and plan and was discharged in stable condition.   Amount and/or Complexity of Data Reviewed Labs: ordered. Decision-making details documented in ED Course. Radiology: ordered. Decision-making details documented in ED Course.  Risk Prescription drug management.     Final diagnoses:  Cellulitis of left lower extremity  Type 2 diabetes mellitus with hyperglycemia, without long-term current use of insulin  [facility])    ED Discharge Orders          Ordered    cephALEXin  (KEFLEX ) 500 MG capsule  4 times daily        03/26/24 1747               Raoul Rake, MD 03/27/24 0057     Patt Alm Macho, MD 03/27/24 [phone]

## 2024-03-26 NOTE — Discharge Instructions (Signed)
 You were seen today for redness and swelling of your left leg. While you were here we monitored your vitals, performed a physical exam, checked labs, and got an ultrasound of the leg. These were all reassuring and there is no indication for any further testing or intervention in the emergency department at this time.   Things to do:  - Follow up with your primary care provider within the next 1-2 weeks - Take keflex antibiotic 4 times daily for 1 week  Return to the emergency department if you have any new or worsening symptoms, or if you have any other serious medical concerns.

## 2024-03-26 NOTE — ED Triage Notes (Incomplete)
 Went to bed last pm was fine woke up this am with swelling to her left leg  with burning.

## 2024-03-26 NOTE — Telephone Encounter (Signed)
 FYI Only or Action Required?: Action required by provider: update on patient condition.  Patient was last seen in primary care on 03/04/2024 by Lindsay Emil Schanz, MD.  Called Nurse Triage reporting Leg Swelling.  Symptoms began yesterday.  Interventions attempted: Prescription medications: Metformin .  Symptoms are: gradually worsening.  Triage Disposition: Go to ED Now (or PCP Triage)  Patient/caregiver understands and will follow disposition?: UnsureCopied from CRM [id number]. Topic: Clinical - Red Word Triage >> Mar 26, 2024 12:44 PM Lindsay Travis wrote: Red Word that prompted transfer to Nurse Triage: swelling and burning sensation in her left leg started last night Reason for Disposition  [1] Blood glucose > 240 mg/dL (86.6 mmol/L) AND [7] vomiting AND [3] unable to check for ketones (in blood or urine)  Answer Assessment - Initial Assessment Questions 1. BLOOD GLUCOSE: What is your blood glucose level?      382 2. ONSET: When did you check the blood glucose?     This morning 3. USUAL RANGE: What is your glucose level usually? (e.g., usual fasting morning value, usual evening value)     140 4. KETONES: Do you check for ketones (urine or blood test strips)? If Yes, ask: What does the test show now?      Na  5. TYPE 1 or 2:  Do you know what type of diabetes you have?  (e.g., Type 1, Type 2, Gestational; doesn't know)      Type 2 6. INSULIN : Do you take insulin ? What type of insulin (s) do you use? What is the mode of delivery? (syringe, pen; injection or pump)?      na 7. DIABETES PILLS: Do you take any pills for your diabetes? If Yes, ask: Have you missed taking any pills recently?     metformin  8. OTHER SYMPTOMS: Do you have any symptoms? (e.g., fever, frequent urination, difficulty breathing, dizziness, weakness, vomiting)    Mouth is very dry    Pt only takes metformin . Blood sugar was 370 yesterday. Pt also has  Left leg swelling and burning. Hurts to  touch. RN advised ED. Pt stated she will get her daughter to take her.  Protocols used: Diabetes - High Blood Sugar-A-AH

## 2024-03-26 NOTE — Progress Notes (Signed)
 LLE venous duplex has been completed.  Preliminary results given to [facility] PA-C.   Results can be found under chart review under CV PROC. 03/26/2024 4:39 PM Pauleen Goleman RVT, RDMS

## 2024-03-31 ENCOUNTER — Encounter: Payer: Self-pay | Admitting: Emergency Medicine

## 2024-03-31 ENCOUNTER — Telehealth: Payer: Self-pay | Admitting: Emergency Medicine

## 2024-03-31 ENCOUNTER — Ambulatory Visit: Admitting: Emergency Medicine

## 2024-03-31 VITALS — BP 138/90 | HR 72 | Temp 98.5°F | Ht 66.0 in | Wt 230.0 lb

## 2024-03-31 DIAGNOSIS — E1159 Type 2 diabetes mellitus with other circulatory complications: Secondary | ICD-10-CM | POA: Diagnosis not present

## 2024-03-31 DIAGNOSIS — E785 Hyperlipidemia, unspecified: Secondary | ICD-10-CM

## 2024-03-31 DIAGNOSIS — Z7984 Long term (current) use of oral hypoglycemic drugs: Secondary | ICD-10-CM

## 2024-03-31 DIAGNOSIS — E1169 Type 2 diabetes mellitus with other specified complication: Secondary | ICD-10-CM | POA: Diagnosis not present

## 2024-03-31 DIAGNOSIS — I152 Hypertension secondary to endocrine disorders: Secondary | ICD-10-CM

## 2024-03-31 DIAGNOSIS — L03116 Cellulitis of left lower limb: Secondary | ICD-10-CM | POA: Insufficient documentation

## 2024-03-31 NOTE — Assessment & Plan Note (Signed)
 BP Readings from Last 3 Encounters:  03/31/24 (!) 138/90  03/26/24 130/69  03/04/24 (!) 160/102  Blood pressure better than before Continue amlodipine  5 mg and hydrochlorothiazide  12.5 mg daily Last hemoglobin A1c was 7.7 Continues glipizide  5 mg twice a day.  Does not like taking metformin  States Farxiga  works when she takes it but is too costly Diet and nutrition discussed Will have clinical pharmacist assist with medication cost

## 2024-03-31 NOTE — Assessment & Plan Note (Signed)
 Diet and nutrition discussed benefit Benefits of exercise discussed Advised to decrease amount of daily carbohydrate intake and daily calories and increase amount of plant-based protein in her diet Follow-up in 6 months

## 2024-03-31 NOTE — Progress Notes (Signed)
 Lindsay Travis 63 y.o.   Chief Complaint  Patient presents with   Hospitalization Follow-up    Patient here for HFU. Patient states she was traveling to [place] when she was experiencing extreme thirst. Was in the ED 03/26/2024. States her sugar level was elevated also. She is still having right leg swelling, pain that comes and goes. Patient mentions the metformin  makes her mouth feel bad     HISTORY OF PRESENT ILLNESS: This is a 63 y.o. female here for follow-up of emergency department visit on 03/25/2024 when she presented with redness and swelling of left lower leg was diagnosed with cellulitis and started on Keflex  500 mg 4 times a day.  Much better today. Was also found to have hyperglycemia. No other complaints or medical concerns today. Lab Results  Component Value Date   HGBA1C 7.7 (A) 01/20/2024     HPI   Prior to Admission medications   Medication Sig Start Date End Date Taking? Authorizing Provider  amLODipine  (NORVASC ) 5 MG tablet Take 1 tablet (5 mg total) by mouth daily. 03/04/24  Yes Patty Leitzke, Emil Schanz, MD  Ascorbic Acid (VITAMIN C) 1000 MG tablet Take 1,000 mg by mouth 2 (two) times a week.   Yes [provider]  aspirin  81 MG EC tablet Take 1 tablet (81 mg total) by mouth daily. 03/20/18  Yes Whitfield Raisin, NP  blood glucose meter kit and supplies Per insurance preference. Check daily fasting blood glucose. (Dx. E11.9). 03/20/22  Yes SagardiaEmil Schanz, MD  Blood Glucose Monitoring Suppl DEVI 1 each by Does not apply route daily. May substitute to any manufacturer covered by patient's insurance. 02/13/24  Yes Jacere Pangborn, Emil Schanz, MD  cephALEXin  (KEFLEX ) 500 MG capsule Take 1 capsule (500 mg total) by mouth 4 (four) times daily for 7 days. 03/26/24 04/02/24 Yes Raoul Rake, MD  ezetimibe  (ZETIA ) 10 MG tablet Take 1 tablet (10 mg total) by mouth daily. 03/08/24  Yes Mihika Surrette, Emil Schanz, MD  famotidine  (PEPCID ) 20 MG tablet Take 1 tablet (20 mg total) by  mouth 2 (two) times daily. 11/05/22  Yes Roemhildt, Lorin T, PA-C  glipiZIDE  (GLUCOTROL ) 5 MG tablet TAKE 1 TABLET (5 MG TOTAL) BY MOUTH TWICE A DAY BEFORE MEALS 02/16/24  Yes Chenel Wernli, Emil Schanz, MD  Glucose Blood (BLOOD GLUCOSE TEST STRIPS) STRP 1 each by In Vitro route daily at 6 (six) AM. May substitute to any manufacturer covered by patient's insurance. 02/13/24  Yes Lillyn Wieczorek, Emil Schanz, MD  hydrochlorothiazide  (HYDRODIURIL ) 12.5 MG tablet Take 1 tablet (12.5 mg total) by mouth daily. 03/04/24  Yes Amulya Quintin, Emil Schanz, MD  Lancets Misc. MISC 1 each by Does not apply route daily at 6 (six) AM. May substitute to any manufacturer covered by patient's insurance. 02/13/24  Yes SagardiaEmil Schanz, MD  Melatonin 10 MG CAPS Take by mouth.   Yes [provider]  metFORMIN  (GLUCOPHAGE -XR) 500 MG 24 hr tablet Take 2 tablets (1,000 mg total) by mouth daily with breakfast. 02/13/24  Yes Herman Mell, Emil Schanz, MD  Omega-3 Fatty Acids (OMEGA-3 FISH OIL PO) Take 1 capsule by mouth in the morning.   Yes [provider]  pregabalin  (LYRICA ) 50 MG capsule TAKE 1 CAPSULE BY MOUTH 2 TIMES DAILY. 02/10/24  Yes Maryah Marinaro, Emil Schanz, MD  oxybutynin  (DITROPAN ) 5 MG tablet Take 1 tablet (5 mg total) by mouth every 8 (eight) hours as needed for bladder spasms. Patient not taking: Reported on 03/31/2024 06/25/23   Purcell Emil Schanz, MD  rosuvastatin  (  CRESTOR ) 10 MG tablet Take 1 tablet (10 mg total) by mouth daily. Patient not taking: Reported on 03/31/2024 03/05/24   Talayia Hjort Jose, MD  traMADol  (ULTRAM ) 50 MG tablet Take 1 tablet (50 mg total) by mouth every 6 (six) hours as needed (pain). Patient not taking: Reported on 03/31/2024 10/13/23   Cleatrice Ludie SAUNDERS, MD    Allergies  Allergen Reactions   Lisinopril Other (See Comments)    Did not feel good   Losartan  Other (See Comments)    headache   Rosuvastatin  Other (See Comments)    Leg pain     Aspirin  Other (See Comments)    Abdominal pain  only 325 mg     Patient Active Problem List   Diagnosis Date Noted   Neuropathic pain 11/19/2023   Bilateral sciatica 11/19/2023   Osteoarthritis of spine with radiculopathy, lumbar region 11/19/2023   Bilateral hip pain 08/28/2023   Overactive bladder 06/25/2023   Dyslipidemia associated with type 2 diabetes mellitus (HCC) 06/19/2022   Statin myopathy 08/01/2021   Obesity, diabetes, and hypertension syndrome (HCC) 01/09/2021   Morbid obesity (HCC) 09/18/2019   Dyslipidemia    Type 2 diabetes mellitus with hyperglycemia, without long-term current use of insulin  (HCC) 08/07/2015   Varicose veins of bilateral lower extremities with other complications 02/24/2012   Uterine leiomyoma 04/18/2011   Hypertension associated with diabetes (HCC) 04/18/2011    Past Medical History:  Diagnosis Date   Anemia    Arthritis    bilateral kneed and lower legs   Clotting disorder (HCC)    Diabetes mellitus    recent high blood sugar and has RX but not taking  med -b/c she doesn't think she  is diabetic, just had lots of sugar in her diet when dx'd.-on meds   Hx of adenomatous polyp of colon 07/2021   Repeat colonoscopy 2029   Hypertension    on meds   Shortness of breath    on exertion- has low hgb   Sickle cell trait [facility])     Past Surgical History:  Procedure Laterality Date   ABDOMINAL HYSTERECTOMY  09/11/2011   Procedure: HYSTERECTOMY ABDOMINAL;  Surgeon: Aida DELENA Na, MD;  Location: [facility];  Service: Gynecology;  Laterality: N/A;   COLONOSCOPY WITH PROPOFOL  N/A 07/16/2021   Procedure: COLONOSCOPY WITH PROPOFOL ;  Surgeon: Avram Lupita BRAVO, MD;  Location: [facility];  Service: Endoscopy;  Laterality: N/A;   POLYPECTOMY  07/16/2021   Procedure: POLYPECTOMY;  Surgeon: Avram Lupita BRAVO, MD;  Location: [facility];  Service: Endoscopy;;    Social History   Socioeconomic History   Marital status: Divorced    Spouse name: Not on file   Number of children: Not on file   Years of  education: Not on file   Highest education level: Not on file  Occupational History   Not on file  Tobacco Use   Smoking status: Never   Smokeless tobacco: Never  Vaping Use   Vaping status: Never Used  Substance and Sexual Activity   Alcohol use: No   Drug use: No   Sexual activity: Never  Other Topics Concern   Not on file  Social History Narrative   Not on file   Social Drivers of Health   Financial Resource Strain: Not on file  Food Insecurity: Not on file  Transportation Needs: Not on file  Physical Activity: Not on file  Stress: Not on file  Social Connections: Not on file  Intimate Partner Violence: Not on  file    Family History  Problem Relation Age of Onset   Hypertension Other    Colon polyps Neg Hx    Colon cancer Neg Hx    Heart disease Neg Hx    Esophageal cancer Neg Hx    Rectal cancer Neg Hx    Stomach cancer Neg Hx      Review of Systems  Constitutional: Negative.   HENT: Negative.  Negative for congestion and sore throat.   Respiratory: Negative.  Negative for cough and shortness of breath.   Cardiovascular: Negative.  Negative for chest pain and palpitations.  Gastrointestinal:  Negative for abdominal pain, diarrhea, nausea and vomiting.  Genitourinary: Negative.  Negative for dysuria and hematuria.  Skin: Negative.  Negative for rash.  Neurological: Negative.  Negative for dizziness and headaches.  All other systems reviewed and are negative.   Vitals:   03/31/24 1526  BP: (!) 138/90  Pulse: 72  Temp: 98.5 F (36.9 C)  SpO2: 97%    Physical Exam Vitals reviewed.  Constitutional:      Appearance: Normal appearance.  HENT:     Head: Normocephalic.     Mouth/Throat:     Mouth: Mucous membranes are moist.     Pharynx: Oropharynx is clear.  Eyes:     Extraocular Movements: Extraocular movements intact.     Pupils: Pupils are equal, round, and reactive to light.  Cardiovascular:     Rate and Rhythm: Normal rate and regular  rhythm.     Pulses: Normal pulses.     Heart sounds: Normal heart sounds.  Pulmonary:     Effort: Pulmonary effort is normal.     Breath sounds: Normal breath sounds.  Musculoskeletal:     Cervical back: No tenderness.     Comments: Left lower extremity: Mild edema.  No redness.  No tenderness  Lymphadenopathy:     Cervical: No cervical adenopathy.  Skin:    General: Skin is warm and dry.  Neurological:     General: No focal deficit present.     Mental Status: She is alert and oriented to person, place, and time.  Psychiatric:        Mood and Affect: Mood normal.        Behavior: Behavior normal.      ASSESSMENT & PLAN: A total of 42 minutes was spent with the patient and counseling/coordination of care regarding preparing for this visit, review of most recent office visit notes, review of multiple chronic medical conditions and their management, review of all medications, review of most recent bloodwork results, review of health maintenance items, education on nutrition, prognosis, documentation, and need for follow up.   Problem List Items Addressed This Visit       Cardiovascular and Mediastinum   Hypertension associated with diabetes (HCC)   BP Readings from Last 3 Encounters:  03/31/24 (!) 138/90  03/26/24 130/69  03/04/24 (!) 160/102  Blood pressure better than before Continue amlodipine  5 mg and hydrochlorothiazide  12.5 mg daily Last hemoglobin A1c was 7.7 Continues glipizide  5 mg twice a day.  Does not like taking metformin  States Farxiga  works when she takes it but is too costly Diet and nutrition discussed Will have clinical pharmacist assist with medication cost       Relevant Orders   AMB Referral [facility] Care Management     Endocrine   Dyslipidemia associated with type 2 diabetes mellitus (HCC)   Improved hemoglobin A1c at 7.7 Continue glipizide  5 mg twice a  day and Farxiga  10 mg daily Intolerant to statins      Relevant Orders   AMB Referral [facility]I  Care Management     Other   Morbid obesity (HCC)   Diet and nutrition discussed benefit Benefits of exercise discussed Advised to decrease amount of daily carbohydrate intake and daily calories and increase amount of plant-based protein in her diet Follow-up in 6 months      Cellulitis of left lower extremity - Primary   Much improved and responding very well to antibiotic Continue and finish Keflex  500 mg 4 times a day Contact the office if no better or worse during the next several days      Patient Instructions  Diabetes Mellitus and Nutrition, Adult When you have diabetes, or diabetes mellitus, it is very important to have healthy eating habits because your blood sugar (glucose) levels are greatly affected by what you eat and drink. Eating healthy foods in the right amounts, at about the same times every day, can help you: Manage your blood glucose. Lower your risk of heart disease. Improve your blood pressure. Reach or maintain a healthy weight. What can affect my meal plan? Every person with diabetes is different, and each person has different needs for a meal plan. Your health care provider may recommend that you work with a dietitian to make a meal plan that is best for you. Your meal plan may vary depending on factors such as: The calories you need. The medicines you take. Your weight. Your blood glucose, blood pressure, and cholesterol levels. Your activity level. Other health conditions you have, such as heart or kidney disease. How do carbohydrates affect me? Carbohydrates, also called carbs, affect your blood glucose level more than any other type of food. Eating carbs raises the amount of glucose in your blood. It is important to know how many carbs you can safely have in each meal. This is different for every person. Your dietitian can help you calculate how many carbs you should have at each meal and for each snack. How does alcohol affect me? Alcohol can cause a  decrease in blood glucose (hypoglycemia), especially if you use insulin  or take certain diabetes medicines by mouth. Hypoglycemia can be a life-threatening condition. Symptoms of hypoglycemia, such as sleepiness, dizziness, and confusion, are similar to symptoms of having too much alcohol. Do not drink alcohol if: Your health care provider tells you not to drink. You are pregnant, may be pregnant, or are planning to become pregnant. If you drink alcohol: Limit how much you have to: 0-1 drink a day for women. 0-2 drinks a day for men. Know how much alcohol is in your drink. In the [place] one drink equals one 12 oz bottle of beer (355 mL), one 5 oz glass of wine (148 mL), or one 1 oz glass of hard liquor (44 mL). Keep yourself hydrated with water, diet soda, or unsweetened iced tea. Keep in mind that regular soda, juice, and other mixers may contain a lot of sugar and must be counted as carbs. What are tips for following this plan?  Reading food labels Start by checking the serving size on the Nutrition Facts label of packaged foods and drinks. The number of calories and the amount of carbs, fats, and other nutrients listed on the label are based on one serving of the item. Many items contain more than one serving per package. Check the total grams (g) of carbs in one serving. Check the number of grams  of saturated fats and trans fats in one serving. Choose foods that have a low amount or none of these fats. Check the number of milligrams (mg) of salt (sodium) in one serving. Most people should limit total sodium intake to less than 2,300 mg per day. Always check the nutrition information of foods labeled as low-fat or nonfat. These foods may be higher in added sugar or refined carbs and should be avoided. Talk to your dietitian to identify your daily goals for nutrients listed on the label. Shopping Avoid buying canned, pre-made, or processed foods. These foods tend to be high in fat, sodium,  and added sugar. Shop around the outside edge of the grocery store. This is where you will most often find fresh fruits and vegetables, bulk grains, fresh meats, and fresh dairy products. Cooking Use low-heat cooking methods, such as baking, instead of high-heat cooking methods, such as deep frying. Cook using healthy oils, such as olive, canola, or sunflower oil. Avoid cooking with butter, cream, or high-fat meats. Meal planning Eat meals and snacks regularly, preferably at the same times every day. Avoid going long periods of time without eating. Eat foods that are high in fiber, such as fresh fruits, vegetables, beans, and whole grains. Eat 4-6 oz (112-168 g) of lean protein each day, such as lean meat, chicken, fish, eggs, or tofu. One ounce (oz) (28 g) of lean protein is equal to: 1 oz (28 g) of meat, chicken, or fish. 1 egg.  cup (62 g) of tofu. Eat some foods each day that contain healthy fats, such as avocado, nuts, seeds, and fish. What foods should I eat? Fruits Berries. Apples. Oranges. Peaches. Apricots. Plums. Grapes. Mangoes. Papayas. Pomegranates. Kiwi. Cherries. Vegetables Leafy greens, including lettuce, spinach, kale, chard, collard greens, mustard greens, and cabbage. Beets. Cauliflower. Broccoli. Carrots. Green beans. Tomatoes. Peppers. Onions. Cucumbers. Brussels sprouts. Grains Whole grains, such as whole-wheat or whole-grain bread, crackers, tortillas, cereal, and pasta. Unsweetened oatmeal. Quinoa. Brown or wild rice. Meats and other proteins Seafood. Poultry without skin. Lean cuts of poultry and beef. Tofu. Nuts. Seeds. Dairy Low-fat or fat-free dairy products such as milk, yogurt, and cheese. The items listed above may not be a complete list of foods and beverages you can eat and drink. Contact a dietitian for more information. What foods should I avoid? Fruits Fruits canned with syrup. Vegetables Canned vegetables. Frozen vegetables with butter or cream  sauce. Grains Refined white flour and flour products such as bread, pasta, snack foods, and cereals. Avoid all processed foods. Meats and other proteins Fatty cuts of meat. Poultry with skin. Breaded or fried meats. Processed meat. Avoid saturated fats. Dairy Full-fat yogurt, cheese, or milk. Beverages Sweetened drinks, such as soda or iced tea. The items listed above may not be a complete list of foods and beverages you should avoid. Contact a dietitian for more information. Questions to ask a health care provider Do I need to meet with a certified diabetes care and education specialist? Do I need to meet with a dietitian? What number can I call if I have questions? When are the best times to check my blood glucose? Where to find more information: American Diabetes Association: diabetes.org Academy of Nutrition and Dietetics: eatright.[employer] of Diabetes and Digestive and Kidney Diseases: StageSync.si Association of Diabetes Care & Education Specialists: diabeteseducator.org Summary It is important to have healthy eating habits because your blood sugar (glucose) levels are greatly affected by what you eat and drink. It is important  to use alcohol carefully. A healthy meal plan will help you manage your blood glucose and lower your risk of heart disease. Your health care provider may recommend that you work with a dietitian to make a meal plan that is best for you. This information is not intended to replace advice given to you by your health care provider. Make sure you discuss any questions you have with your health care provider. Document Revised: 03/28/2020 Document Reviewed: 03/29/2020 Elsevier Patient Education  2024 Elsevier Inc.    Emil Schaumann, MD [facility]

## 2024-03-31 NOTE — Patient Instructions (Signed)

## 2024-03-31 NOTE — Assessment & Plan Note (Signed)
 Much improved and responding very well to antibiotic Continue and finish Keflex  500 mg 4 times a day Contact the office if no better or worse during the next several days

## 2024-03-31 NOTE — Telephone Encounter (Unsigned)
 Copied from CRM [id number]. Topic: Clinical - Medication Question >> Mar 31, 2024 10:05 AM Henretta I wrote: Reason for CRM: Reason for CRM: Patient would like to know if the farxiga  for diabetes can be prescribed and be sent to [id number] [place], [place] - [address] AT [facility] OF [facility] & [address] [place] [address] Phone: [phone] Fax: [phone] Hours: Not open 24 hours

## 2024-03-31 NOTE — Assessment & Plan Note (Signed)
 Improved hemoglobin A1c at 7.7 Continue glipizide  5 mg twice a day and Farxiga  10 mg daily Intolerant to statins

## 2024-04-01 NOTE — Telephone Encounter (Signed)
 LVM for patient regarding her farxiga  request. Patient was informed during visit that patient assistance will be reaching out to her to help her with this.

## 2024-04-09 ENCOUNTER — Telehealth: Payer: Self-pay | Admitting: *Deleted

## 2024-04-09 NOTE — Progress Notes (Signed)
 Care Guide Pharmacy Note  04/09/2024 Name: Lindsay Travis MRN: [id number] DOB: March 27, 1961  Referred By: Purcell Emil Schanz, MD Reason for referral: Complex Care Management (Outreach to schedule referral with pharmacist )   Lindsay Travis is a 63 y.o. year old female who is a primary care patient of Sagardia, Miguel Jose, MD.  Lindsay Travis was referred to the pharmacist for assistance related to: DMII  Successful contact was made with the patient to discuss pharmacy services including being ready for the pharmacist to call at least 5 minutes before the scheduled appointment time and to have medication bottles and any blood pressure readings ready for review. The patient agreed to meet with the pharmacist via telephone visit on 04/15/2024  Thedford Franks, CMA [place]  [facility], [facility] Guide Direct Dial: [phone]  Fax: [phone] Website: [email or web address]

## 2024-04-15 ENCOUNTER — Other Ambulatory Visit (INDEPENDENT_AMBULATORY_CARE_PROVIDER_SITE_OTHER): Admitting: Pharmacist

## 2024-04-15 ENCOUNTER — Telehealth: Payer: Self-pay

## 2024-04-15 DIAGNOSIS — Z7984 Long term (current) use of oral hypoglycemic drugs: Secondary | ICD-10-CM

## 2024-04-15 DIAGNOSIS — E1165 Type 2 diabetes mellitus with hyperglycemia: Secondary | ICD-10-CM

## 2024-04-15 NOTE — Progress Notes (Signed)
   04/15/2024 Name: Lindsay Travis MRN: [id number] DOB: 04/22/1961  Chief Complaint  Patient presents with   Medication Management   Diabetes    Lindsay Travis is a 63 y.o. year old female who presented for a telephone visit.   They were referred to the pharmacist by their PCP for assistance in managing medication access.   Subjective:  Care Team: Primary Care Provider: Purcell Emil Schanz, MD ; Next Scheduled Visit: 07/22/24   Medication Access/Adherence  Current Pharmacy:  [id number] [place], [place] - [address] [place] [address] Phone: [phone] Fax: [phone]  [id number] [place], [place] - [address] AT [facility] OF [facility] & [address] [place] [address] Phone: [phone] Fax: [phone]  [id number] - [place], [place] - [address] AT [facility] OF [address] & [address] [place] [address] Phone: [phone] Fax: [phone]   Patient reports affordability concerns with their medications: Yes  Patient reports access/transportation concerns to their pharmacy: No  Patient reports adherence concerns with their medications:  No     Diabetes:  Current medications: glipizide  5 mg BID Medications tried in the past: metformin  ER  Pt stated that metformin  causes her blood sugars to go up. She declines to take metformin .   Farxiga  was $800 at the pharmacy with her insurance - Patient stated that she is single and makes $32,000 per year   Objective:  Lab Results  Component Value Date   HGBA1C 7.7 (A) 01/20/2024    Lab Results  Component Value Date   CREATININE 0.87 03/26/2024   BUN 10 03/26/2024   NA 132 (L) 03/26/2024   K 3.6 03/26/2024   CL 101 03/26/2024   CO2 21 (L) 03/26/2024    Lab Results  Component Value Date   CHOL 206 (H) 03/04/2024   HDL 69.50 03/04/2024   LDLCALC 121  (H) 03/04/2024   TRIG 77.0 03/04/2024   CHOLHDL 3 03/04/2024    Medications Reviewed Today   Medications were not reviewed in this encounter       Assessment/Plan:   Diabetes: - Currently uncontrolled, A1c goal <7% - Her insurance only pays 50% on drugs Tier 2 or higher. Checked with her pharmacy and even with a coupon, Farxiga  is $400 for 30 DS. No brand name medications will be an option for her due to cost.  - She meets income criteria for PAP, will try to apply for AZ&Me   Lum Ricks, PharmD Candidate  [facility], [employer]    Darrelyn Drum, PharmD, [employer], CPP Clinical Pharmacist Practitioner [facility] at [facility] [phone]

## 2024-04-15 NOTE — Telephone Encounter (Signed)
 Fill and email PAP AZ&ME Farxiga  to [facility] Cristy pt will come in to provider office to sign.

## 2024-04-15 NOTE — Patient Instructions (Addendum)
 It was a pleasure speaking with you today!  I will leave paperwork at the front at the front office for you to sign regarding the patient assistance program.   Feel free to call with any questions or concerns!  Lum Ricks, PharmD Candidate  [employer], [employer]    Darrelyn Drum, PharmD, [employer], CPP Clinical Pharmacist Practitioner [facility] at [facility] [phone]

## 2024-05-04 NOTE — Telephone Encounter (Addendum)
 Noticed patient now seems to have Medicaid. [facility] to [facility] to [facility] the prescription for 30 DS and it went through [facility] for $4. PAP no longer needed. Notified [facility] that [facility] will be [facility].  Curlee Drum, PharmD, BCPS, CPP Clinical Pharmacist Practitioner [facility] at [facility] [phone]

## 2024-05-13 ENCOUNTER — Other Ambulatory Visit: Payer: Self-pay | Admitting: Neurosurgery

## 2024-05-13 DIAGNOSIS — R29818 Other symptoms and signs involving the nervous system: Secondary | ICD-10-CM

## 2024-05-19 NOTE — Discharge Instructions (Signed)

## 2024-05-20 ENCOUNTER — Ambulatory Visit
Admission: RE | Admit: 2024-05-20 | Discharge: 2024-05-20 | Disposition: A | Discharge status: Home / Self Care | Source: Ambulatory Visit | Attending: Neurosurgery | Admitting: Neurosurgery

## 2024-05-20 ENCOUNTER — Inpatient Hospital Stay
Admission: RE | Admit: 2024-05-20 | Discharge: 2024-05-20 | Disposition: A | Discharge status: Home / Self Care | Source: Ambulatory Visit | Attending: Neurosurgery | Admitting: Neurosurgery

## 2024-05-20 DIAGNOSIS — R29818 Other symptoms and signs involving the nervous system: Secondary | ICD-10-CM

## 2024-05-20 MED ORDER — MEPERIDINE HCL 50 MG/ML IJ SOLN
50.0000 mg | Freq: Once | INTRAMUSCULAR | Status: AC | PRN
  Administered 2024-05-20: 50 mg via INTRAMUSCULAR

## 2024-05-20 MED ORDER — ONDANSETRON HCL 4 MG/2ML IJ SOLN
4.0000 mg | Freq: Once | INTRAMUSCULAR | Status: AC | PRN
  Administered 2024-05-20: 4 mg via INTRAMUSCULAR

## 2024-05-20 MED ORDER — IOPAMIDOL (ISOVUE-M 200) INJECTION 41%
10.0000 mL | Freq: Once | INTRAMUSCULAR | Status: AC
  Administered 2024-05-20: 18 mL via INTRATHECAL

## 2024-05-20 MED ORDER — DIAZEPAM 5 MG PO TABS: 10.0000 mg | ORAL_TABLET | Freq: Once | ORAL | Status: DC

## 2024-05-20 NOTE — Discharge Instr - Other Info (Signed)
 Pt c/o 8/10 pain radiating through hips. See [facility]

## 2024-06-28 NOTE — Therapy (Incomplete)
 OUTPATIENT PHYSICAL THERAPY THORACOLUMBAR EVALUATION  Patient Name: Lindsay Travis MRN: [id number] DOB:[birth year], 62 y.o., female Today's Date: 06/28/2024    Past Medical History:  Diagnosis Date   Anemia    Arthritis    bilateral kneed and lower legs   Clotting disorder    Diabetes mellitus    recent high blood sugar and has RX but not taking  med -b/c she doesn't think she  is diabetic, just had lots of sugar in her diet when dx'd.-on meds   Hx of adenomatous polyp of colon 07/2021   Repeat colonoscopy 2029   Hypertension    on meds   Shortness of breath    on exertion- has low hgb   Sickle cell trait    Past Surgical History:  Procedure Laterality Date   ABDOMINAL HYSTERECTOMY  09/11/2011   Procedure: HYSTERECTOMY ABDOMINAL;  Surgeon: Lindsay DELENA Na, MD;  Location: [facility];  Service: Gynecology;  Laterality: N/A;   COLONOSCOPY WITH PROPOFOL  N/A 07/16/2021   Procedure: COLONOSCOPY WITH PROPOFOL ;  Surgeon: Lindsay Lupita BRAVO, MD;  Location: [facility];  Service: Endoscopy;  Laterality: N/A;   POLYPECTOMY  07/16/2021   Procedure: POLYPECTOMY;  Surgeon: Lindsay Lupita BRAVO, MD;  Location: [facility];  Service: Endoscopy;;   Patient Active Problem List   Diagnosis Date Noted   Cellulitis of left lower extremity 03/31/2024   Neuropathic pain 11/19/2023   Bilateral sciatica 11/19/2023   Osteoarthritis of spine with radiculopathy, lumbar region 11/19/2023   Bilateral hip pain 08/28/2023   Overactive bladder 06/25/2023   Dyslipidemia associated with type 2 diabetes mellitus (HCC) 06/19/2022   Statin myopathy 08/01/2021   Obesity, diabetes, and hypertension syndrome (HCC) 01/09/2021   Morbid obesity (HCC) 09/18/2019   Dyslipidemia    Type 2 diabetes mellitus with hyperglycemia, without long-term current use of insulin  (HCC) 08/07/2015   Varicose veins of bilateral lower extremities with other complications 02/24/2012   Uterine leiomyoma 04/18/2011   Hypertension associated  with diabetes (HCC) 04/18/2011    PCP: Lindsay Emil Schanz, MD  REFERRING PROVIDER: Mavis Purchase, MD  THERAPY DIAG:  No diagnosis found.  REFERRING DIAG: Lumbar stenosis with neurogenic claudication [M48.062]   Rationale for Evaluation and Treatment:  Rehabilitation  SUBJECTIVE:  PERTINENT PAST HISTORY:  DM TII, spondy      PRECAUTIONS: L4-L5 spondy  WEIGHT BEARING RESTRICTIONS {Yes ***/No:24003}  FALLS:  Has patient fallen in last 6 months? {yes/no:20286}, Number of falls: ***  MOI/History of condition:  Onset date: ***  SUBJECTIVE STATEMENT  Pt is a 63 y.o. female who presents to clinic with chief complaint of ***.  ***   Red flags:  {has/denies:26543} {kerredflag:26542}  Pain:  Are you having pain? {yes/no:20286} Pain location: *** NPRS scale:  Best: {NUMBERS; 0-10:5044}/10, Worst: {NUMBERS; 0-10:5044}/10 Aggravating factors: *** Relieving factors: *** Pain description: {PAIN DESCRIPTION:21022940}  Occupation: ***  Assistive Device: ***  Hand Dominance: ***  Patient Goals/Specific Activities: ***   OBJECTIVE:   DIAGNOSTIC FINDINGS:    GENERAL OBSERVATION/GAIT: ***  SENSATION: Light touch: {intact/deficits:24005}  LUMBAR AROM  AROM AROM  (Eval)  Flexion {kerromlxflex:28296}  Extension {kerromcxlx:26716}  Right lateral flexion {kerromcxlx:26716}  Left lateral flexion {kerromcxlx:26716}  Right rotation {kerromcxlx:26716}  Left rotation {kerromcxlx:26716}    (Blank rows = not tested)   LE MMT:  MMT Right (Eval) Left (Eval)  Hip flexion (L2, L3) *** ***  Knee extension (L3) *** ***  Knee flexion    Hip abduction *** ***  Hip extension *** ***  Hip external  rotation *** ***  Hip internal rotation *** ***  Hip adduction    Ankle dorsiflexion (L4)    Ankle plantarflexion (S1)    Ankle inversion    Ankle eversion    Great Toe ext (L5)    Grossly     (Blank rows = not tested, score listed is out of 5 possible points.  N  = WNL, D = diminished, C = clear for gross weakness with myotome testing, * = concordant pain with testing)  SPECIAL TESTS:  Straight leg raise: L (***), R (***) Slump: L (***), R (***)  MUSCLE LENGTH: Hamstrings: Right {kerminsig:27227} restriction; Left {kerminsig:27227} restriction Hip flexors: Right {kerminsig:27227} restriction; Left {kerminsig:27227} restriction  LE ROM:  ROM Right (Eval) Left (Eval)  Hip flexion    Hip extension    Hip abduction    Hip adduction    Hip internal rotation    Hip external rotation    Knee flexion    Knee extension    Ankle dorsiflexion    Ankle plantarflexion    Ankle inversion    Ankle eversion      (Blank rows = not tested, N = WNL, * = concordant pain with testing)  Functional Tests  Eval                                                                PALPATION:   ***  PATIENT SURVEYS:  ODI: ***  TODAY'S TREATMENT:  Therapeutic Exercise: Creating, reviewing, and completing below HEP  PATIENT EDUCATION ([place]/HM):  POC, diagnosis, prognosis, HEP, and outcome measures.  Pt educated via explanation, demonstration, and handout (HEP).  Pt confirms understanding verbally.   HOME EXERCISE PROGRAM: ***  Treatment priorities   Eval                                                  ASSESSMENT:  CLINICAL IMPRESSION: Lindsay Travis is a 63 y.o. female who presents to clinic with signs and sxs consistent with ***.   ***.   Lindsay Travis will benefit from skilled PT to address relevant deficits and improve ***.   OBJECTIVE IMPAIRMENTS: Pain, ***  ACTIVITY LIMITATIONS: ***  PERSONAL FACTORS: See medical history and pertinent history   REHAB POTENTIAL: Good  CLINICAL DECISION MAKING: Evolving/moderate complexity  EVALUATION COMPLEXITY: Moderate   GOALS:   SHORT TERM GOALS: Target date: ***  Lindsay Travis will be >75% HEP compliant to improve carryover between sessions and facilitate independent management of  condition  Evaluation: ongoing Goal status: INITIAL   LONG TERM GOALS: Target date: ***  Lindsay Travis will self report >/= 50% decrease in pain from evaluation to improve function in daily tasks  Evaluation/Baseline: ***/10 max pain Goal status: INITIAL   2.  Lindsay Travis will show a >/= *** pt improvement in their ODI score (MCID is 12% or 6/50 pts) as a proxy for functional improvement   Evaluation/Baseline: *** pts Goal status: INITIAL   3.  Chrysa will be able to ***, not limited by pain  Evaluation/Baseline: limited Goal status: INITIAL   4.  ***   5.  ***   6.  ***  PLAN: PT FREQUENCY: 1-2x/week  PT DURATION: 8 weeks  PLANNED INTERVENTIONS:  97164- PT Re-evaluation, 97110-Therapeutic exercises, 97530- Therapeutic activity, [id number]- Neuromuscular re-education, 97535- Self Care, 02859- Manual therapy, [id number]- Gait training, [id number]- Aquatic Therapy, [phone]- Electrical stimulation (manual), [id number]- Vasopneumatic device, [id number]- Traction (mechanical), [id number]- Ionotophoresis 4mg /ml Dexamethasone , Taping, Dry Needling, Joint manipulation, and Spinal manipulation.   Rastus Borton PT, DPT 06/28/2024, 1:35 PM

## 2024-06-29 ENCOUNTER — Ambulatory Visit: Attending: Physical Therapy | Admitting: Physical Therapy

## 2024-07-07 ENCOUNTER — Ambulatory Visit

## 2024-07-07 NOTE — Therapy (Incomplete)
 OUTPATIENT PHYSICAL THERAPY THORACOLUMBAR EVALUATION   Patient Name: Lindsay Travis MRN: [id number] DOB:October 21, 1960, 63 y.o., female Today's Date: 07/07/2024  END OF SESSION:   Past Medical History:  Diagnosis Date   Anemia    Arthritis    bilateral kneed and lower legs   Clotting disorder    Diabetes mellitus    recent high blood sugar and has RX but not taking  med -b/c she doesn't think she  is diabetic, just had lots of sugar in her diet when dx'd.-on meds   Hx of adenomatous polyp of colon 07/2021   Repeat colonoscopy 2029   Hypertension    on meds   Shortness of breath    on exertion- has low hgb   Sickle cell trait    Past Surgical History:  Procedure Laterality Date   ABDOMINAL HYSTERECTOMY  09/11/2011   Procedure: HYSTERECTOMY ABDOMINAL;  Surgeon: Aida DELENA Na, MD;  Location: [facility];  Service: Gynecology;  Laterality: N/A;   COLONOSCOPY WITH PROPOFOL  N/A 07/16/2021   Procedure: COLONOSCOPY WITH PROPOFOL ;  Surgeon: Avram Lupita BRAVO, MD;  Location[facility];  Service: Endoscopy;  Laterality: N/A;   POLYPECTOMY  07/16/2021   Procedure: POLYPECTOMY;  Surgeon: Avram Lupita BRAVO, MD;  Location: [facility];  Service: Endoscopy;;   Patient Active Problem List   Diagnosis Date Noted   Cellulitis of left lower extremity 03/31/2024   Neuropathic pain 11/19/2023   Bilateral sciatica 11/19/2023   Osteoarthritis of spine with radiculopathy, lumbar region 11/19/2023   Bilateral hip pain 08/28/2023   Overactive bladder 06/25/2023   Dyslipidemia associated with type 2 diabetes mellitus (HCC) 06/19/2022   Statin myopathy 08/01/2021   Obesity, diabetes, and hypertension syndrome (HCC) 01/09/2021   Morbid obesity (HCC) 09/18/2019   Dyslipidemia    Type 2 diabetes mellitus with hyperglycemia, without long-term current use of insulin  (HCC) 08/07/2015   Varicose veins of bilateral lower extremities with other complications 02/24/2012   Uterine leiomyoma 04/18/2011    Hypertension associated with diabetes (HCC) 04/18/2011    PCP: Purcell Emil Schanz, MD   REFERRING PROVIDER:  Mavis Purchase, MD      REFERRING DIAG: [phone] (ICD-10-CM) - Lumbar stenosis with neurogenic claudication   Rationale for Evaluation and Treatment: Rehabilitation  THERAPY DIAG:  No diagnosis found.  ONSET DATE: ***  SUBJECTIVE:                                                                                                                                                                                           SUBJECTIVE STATEMENT: ***  PERTINENT HISTORY:  ***  PAIN:  Are you having pain? {OPRCPAIN:27236}  PRECAUTIONS: {Therapy precautions:24002}  RED FLAGS: {PT Red Flags:29287}   WEIGHT BEARING RESTRICTIONS: {Yes ***/No:24003}  FALLS:  Has patient fallen in last 6 months? {fallsyesno:27318}  LIVING ENVIRONMENT: Lives with: {OPRC lives with:25569::lives with their family} Lives in: {Lives in:25570} Stairs: {opstairs:27293} Has following equipment at home: {Assistive devices:23999}  OCCUPATION: ***  PLOF: {PLOF:24004}  PATIENT GOALS: ***  NEXT MD VISIT: ***  OBJECTIVE:  Note: Objective measures were completed at Evaluation unless otherwise noted.  DIAGNOSTIC FINDINGS:  ***  PATIENT SURVEYS:  {rehab surveys:24030}  COGNITION: Overall cognitive status: {cognition:24006}     SENSATION: {sensation:27233}  MUSCLE LENGTH: Hamstrings: Right *** deg; Left *** deg Debby test: Right *** deg; Left *** deg  POSTURE: {posture:25561}  PALPATION: ***  LUMBAR ROM:   AROM eval  Flexion   Extension   Right lateral flexion   Left lateral flexion   Right rotation   Left rotation    (Blank rows = not tested)  LOWER EXTREMITY ROM:     {AROM/PROM:27142}  Right eval Left eval  Hip flexion    Hip extension    Hip abduction    Hip adduction    Hip internal rotation    Hip external rotation    Knee flexion    Knee extension     Ankle dorsiflexion    Ankle plantarflexion    Ankle inversion    Ankle eversion     (Blank rows = not tested)  LOWER EXTREMITY MMT:    MMT Right eval Left eval  Hip flexion    Hip extension    Hip abduction    Hip adduction    Hip internal rotation    Hip external rotation    Knee flexion    Knee extension    Ankle dorsiflexion    Ankle plantarflexion    Ankle inversion    Ankle eversion     (Blank rows = not tested)  LUMBAR SPECIAL TESTS:  {lumbar special test:25242}  FUNCTIONAL TESTS:  {Functional tests:24029}  GAIT: Distance walked: *** Assistive device utilized: {Assistive devices:23999} Level of assistance: {Levels of assistance:24026} Comments: ***  TREATMENT DATE: ***                                                                                                                                 PATIENT EDUCATION:  Education details: *** Person educated: {Person educated:25204} Education method: {Education Method:25205} Education comprehension: {Education Comprehension:25206}  HOME EXERCISE PROGRAM: ***  ASSESSMENT:  CLINICAL IMPRESSION: Patient is a *** y.o. *** who was seen today for physical therapy evaluation and treatment for ***.   OBJECTIVE IMPAIRMENTS: {opptimpairments:25111}.   ACTIVITY LIMITATIONS: {activitylimitations:27494}  PARTICIPATION LIMITATIONS: {participationrestrictions:25113}  PERSONAL FACTORS: {Personal factors:25162} are also affecting patient's functional outcome.   REHAB POTENTIAL: {rehabpotential:25112}  CLINICAL DECISION MAKING: {clinical decision making:25114}  EVALUATION COMPLEXITY: {Evaluation complexity:25115}   GOALS: Goals reviewed with patient? {yes/no:20286}  SHORT TERM GOALS: Target date: ***  *** Baseline: Goal  status: INITIAL  2.  *** Baseline:  Goal status: INITIAL  3.  *** Baseline:  Goal status: INITIAL  4.  *** Baseline:  Goal status: INITIAL  5.  *** Baseline:  Goal status:  INITIAL  6.  *** Baseline:  Goal status: INITIAL  LONG TERM GOALS: Target date: ***  *** Baseline:  Goal status: INITIAL  2.  *** Baseline:  Goal status: INITIAL  3.  *** Baseline:  Goal status: INITIAL  4.  *** Baseline:  Goal status: INITIAL  5.  *** Baseline:  Goal status: INITIAL  6.  *** Baseline:  Goal status: INITIAL  PLAN:  PT FREQUENCY: {rehab frequency:25116}  PT DURATION: {rehab duration:25117}  PLANNED INTERVENTIONS: {rehab planned interventions:25118::97110-Therapeutic exercises,97530- Therapeutic [phone]- Neuromuscular re-education,97535- Self Rjmz,02859- Manual therapy,Patient/Family education}.  PLAN FOR NEXT SESSION: PIERRETTE Marko Molt, PT 07/07/2024, 8:28 AM

## 2024-07-21 ENCOUNTER — Other Ambulatory Visit: Payer: Self-pay

## 2024-07-21 ENCOUNTER — Ambulatory Visit: Attending: Emergency Medicine | Admitting: Physical Therapy

## 2024-07-21 ENCOUNTER — Encounter: Payer: Self-pay | Admitting: Physical Therapy

## 2024-07-21 DIAGNOSIS — M5416 Radiculopathy, lumbar region: Secondary | ICD-10-CM | POA: Diagnosis present

## 2024-07-21 DIAGNOSIS — M6281 Muscle weakness (generalized): Secondary | ICD-10-CM | POA: Insufficient documentation

## 2024-07-21 DIAGNOSIS — M5459 Other low back pain: Secondary | ICD-10-CM | POA: Diagnosis present

## 2024-07-21 DIAGNOSIS — R2681 Unsteadiness on feet: Secondary | ICD-10-CM | POA: Insufficient documentation

## 2024-07-21 NOTE — Therapy (Signed)
 OUTPATIENT PHYSICAL THERAPY THORACOLUMBAR EVALUATION  Patient Name: Lindsay Travis MRN: [id number] DOB:1961-07-28, 63 y.o., female Today's Date: 07/21/2024   PT End of Session - 07/21/24 1425     Visit Number 1    Number of Visits --   1-2x/week   Date for Recertification  09/15/24    Authorization Type [employer] MCD    PT Start Time 1055    PT Stop Time 1145    PT Time Calculation (min) 50 min          Past Medical History:  Diagnosis Date   Anemia    Arthritis    bilateral kneed and lower legs   Clotting disorder    Diabetes mellitus    recent high blood sugar and has RX but not taking  med -b/c she doesn't think she  is diabetic, just had lots of sugar in her diet when dx'd.-on meds   Hx of adenomatous polyp of colon 07/2021   Repeat colonoscopy 2029   Hypertension    on meds   Shortness of breath    on exertion- has low hgb   Sickle cell trait    Past Surgical History:  Procedure Laterality Date   ABDOMINAL HYSTERECTOMY  09/11/2011   Procedure: HYSTERECTOMY ABDOMINAL;  Surgeon: Aida DELENA Na, MD;  Location: [facility];  Service: Gynecology;  Laterality: N/A;   COLONOSCOPY WITH PROPOFOL  N/A 07/16/2021   Procedure: COLONOSCOPY WITH PROPOFOL ;  Surgeon: Avram Lupita BRAVO, MD;  Location: [facility];  Service: Endoscopy;  Laterality: N/A;   POLYPECTOMY  07/16/2021   Procedure: POLYPECTOMY;  Surgeon: Avram Lupita BRAVO, MD;  Location: [facility];  Service: Endoscopy;;   Patient Active Problem List   Diagnosis Date Noted   Cellulitis of left lower extremity 03/31/2024   Neuropathic pain 11/19/2023   Bilateral sciatica 11/19/2023   Osteoarthritis of spine with radiculopathy, lumbar region 11/19/2023   Bilateral hip pain 08/28/2023   Overactive bladder 06/25/2023   Dyslipidemia associated with type 2 diabetes mellitus (HCC) 06/19/2022   Statin myopathy 08/01/2021   Obesity, diabetes, and hypertension syndrome (HCC) 01/09/2021   Morbid obesity (HCC) 09/18/2019   Dyslipidemia     Type 2 diabetes mellitus with hyperglycemia, without long-term current use of insulin  (HCC) 08/07/2015   Varicose veins of bilateral lower extremities with other complications 02/24/2012   Uterine leiomyoma 04/18/2011   Hypertension associated with diabetes (HCC) 04/18/2011    PCP: Purcell Emil Schanz, MD  REFERRING PROVIDER: Purcell Emil Schanz, *  THERAPY DIAG:  Other low back pain - Plan: PT plan of care cert/re-cert  Lumbar radiculopathy - Plan: MR LUMBAR SPINE WO CONTRAST, PT plan of care cert/re-cert  Muscle weakness - Plan: PT plan of care cert/re-cert  Unsteadiness on feet - Plan: PT plan of care cert/re-cert  REFERRING DIAG:  Lumbar radiculopathy [M54.16]   Rationale for Evaluation and Treatment:  Rehabilitation  SUBJECTIVE:  PERTINENT PAST HISTORY:  DM II, HTN      PRECAUTIONS: Fall, spondy  WEIGHT BEARING RESTRICTIONS No  FALLS:  Has patient fallen in last 6 months? Yes, Number of falls: 1  MOI/History of condition:  Onset date: 2 months ago   SUBJECTIVE STATEMENT  Pt is a 63 y.o. female who presents to clinic with chief complaint of: 5 years of back and hip pain; comes and goes; 2 months for this episode 5-15 minutes of standing before needing to sit  Locates pain in posterior hips and down legs  Legs feel heavy and weak Difficulty cleaning, cooking,  walking, stairs, lifting Performs previous HEP - helps some  Leans on cart at store  From referring provider:     Red flags:  denies BB changes  Pain:  Are you having pain? Yes Pain location: B hips and legs (posterior) NPRS scale:  Worst: 9/10 Aggravating factors: standing, cooking, cleaning, walking, stairs Relieving factors: sitting Pain description: sharp and aching  Occupation: NA  Assistive Device: none  Hand Dominance: NA  Patient Goals/Specific Activities: reduce pain   OBJECTIVE:   DIAGNOSTIC FINDINGS:    GENERAL OBSERVATION/GAIT: Slow antalgic  gait  SENSATION: Light touch: Appears intact  LUMBAR AROM  AROM AROM  (Eval)  Flexion Fingertips to toes  Extension limited by 75% increases pain  Right lateral flexion limited by 25% increases pain  Left lateral flexion limited by 25% increases pain  Right rotation limited by 25%  Left rotation limited by 25%    (Blank rows = not tested)   LE MMT:  MMT Right (Eval) Left (Eval)  Hip flexion (L2, L3) 4/5 4/5  Knee extension (L3) 3+/5 3+/5  Knee flexion    Hip abduction 3+/5 3-/5  Hip extension    Hip external rotation    Hip internal rotation    Hip adduction    Ankle dorsiflexion (L4) C C  Ankle plantarflexion (S1) C C  Ankle inversion    Ankle eversion    Great Toe ext (L5) C C  Grossly     (Blank rows = not tested, score listed is out of 5 possible points.  N = WNL, D = diminished, C = clear for gross weakness with myotome testing, * = concordant pain with testing)  SPECIAL TESTS:  Straight leg raise: L (+), R (-) Slump: L (+), R (-)  MUSCLE LENGTH: Hamstrings: Right no restriction; Left no restriction Hip flexors: Right significant restriction; Left significant restriction  LE ROM:  ROM Right (Eval) Left (Eval)  Hip flexion    Hip extension    Hip abduction    Hip adduction    Hip internal rotation    Hip external rotation    Knee flexion    Knee extension    Ankle dorsiflexion    Ankle plantarflexion    Ankle inversion    Ankle eversion      (Blank rows = not tested, N = WNL, * = concordant pain with testing)  Functional Tests  Eval    30 second sit to stand 6x w/ light UE support                                                             PALPATION:   Tenderness over L lateral hip   PATIENT SURVEYS:  ODI: 24/50  HOME EXERCISE PROGRAM: Access Code: [email or web address] URL: [email or web address] Date: 07/21/2024 Prepared by: Lindsay Travis  Exercises - Supine Lower Trunk Rotation  - 1 x daily - 7 x weekly - 1  sets - 20 reps - 3 hold - Supine Hip Adduction Isometric with Ball  - 1 x daily - 7 x weekly - 2 sets - 10 reps - 10'' hold - Hooklying Isometric Clamshell  - 1 x daily - 7 x weekly - 2 sets - 10 reps - Bridge  - 1 x daily - 7 x weekly - 3 sets -  5 reps - 5-10'' hold  Treatment priorities   Eval        Hip flexor stretching        Flexion based strengthening        aquatics                         TODAY'S TREATMENT:  Therapeutic Exercise: Creating, reviewing, and completing HEP   PATIENT EDUCATION ([place]/HM):  POC, diagnosis, prognosis, HEP, and outcome measures.  Pt educated via explanation, demonstration, and handout (HEP).  Pt confirms understanding verbally.   ASSESSMENT:  CLINICAL IMPRESSION: Lindsay Travis is a 63 y.o. female who presents to clinic with signs and sxs consistent with low back pain with radicular pain secondary to spinal stenosis.   No significant lower quarter weakness with myotome weakness or sensory deficits.   ODI shows severe disability.  Lindsay Travis will benefit from skilled PT to address relevant deficits and improve comfort and safety with daily functional mobility .   OBJECTIVE IMPAIRMENTS: Pain, LE/hip/core strength, gait, balance  ACTIVITY LIMITATIONS: walking, standing, shopping, housework, self care, recreation, social life  PERSONAL FACTORS: See medical history and pertinent history   REHAB POTENTIAL: Good  CLINICAL DECISION MAKING: Evolving/moderate complexity  EVALUATION COMPLEXITY: Moderate   GOALS:   SHORT TERM GOALS: Target date: 08/18/2024   Lindsay Travis will be >75% HEP compliant to improve carryover between sessions and facilitate independent management of condition  Evaluation: ongoing Goal status: INITIAL   LONG TERM GOALS: Target date: 09/15/2024   Lindsay Travis will self report >/= 33% decrease in pain from evaluation to improve function in daily tasks  Evaluation/Baseline: 9/10 max pain Goal status: INITIAL   2.  Lindsay Travis will  show a >/= 8 pt improvement in their ODI score (MCID is 12% or 6/50 pts) as a proxy for functional improvement   Evaluation/Baseline: 24/50 pts Goal status: INITIAL   3.  Lindsay Travis will be able to consistently walk for up to 25 min, not limited by pain, to enable shopping and daily activities  Evaluation/Baseline: 5-15 min Goal status: INITIAL   4.  Lindsay Travis will improve 30'' STS (MCID 2) to >/= 8x (w/ UE?: y) to show improved LE strength and improved transfers   Evaluation/Baseline: 6x  w/ UE? y Goal status: INITIAL     PLAN: PT FREQUENCY: 1-2x/week  PT DURATION: 8 weeks  PLANNED INTERVENTIONS:  97164- PT Re-evaluation, 97110-Therapeutic exercises, 97530- Therapeutic activity, [id number]- Neuromuscular re-education, 97535- Self Care, 02859- Manual therapy, [id number]- Gait training, [id number]- Aquatic Therapy, [id number]- Electrical stimulation (manual), [id number]- Vasopneumatic device, [id number]- Traction (mechanical), [id number]- Ionotophoresis 4mg /ml Dexamethasone , Taping, Dry Needling, Joint manipulation, and Spinal manipulation.   Lindsay BRAVO Lindsay Travis PT 07/21/2024, 2:48 PM

## 2024-07-22 ENCOUNTER — Other Ambulatory Visit: Payer: Self-pay | Admitting: Emergency Medicine

## 2024-07-22 ENCOUNTER — Ambulatory Visit: Admitting: Emergency Medicine

## 2024-07-22 DIAGNOSIS — E1159 Type 2 diabetes mellitus with other circulatory complications: Secondary | ICD-10-CM

## 2024-07-22 MED ORDER — AMLODIPINE BESYLATE 5 MG PO TABS: 5.0000 mg | ORAL_TABLET | Freq: Every day | ORAL | 1 refills | Status: DC

## 2024-07-22 NOTE — Telephone Encounter (Signed)
 Copied from CRM #[id number]. Topic: Clinical - Medication Refill >> Jul 22, 2024 10:57 AM Berneda FALCON wrote: Medication:  amLODipine  (NORVASC ) 5 MG tablet gabapentin  (NEURONTIN ) 300 MG capsule (This is showing as discontinued on her med list, but she states she wants it  Has the patient contacted their pharmacy? Yes (Agent: If no, request that the patient contact the pharmacy for the refill. If patient does not wish to contact the pharmacy document the reason why and proceed with request.) (Agent: If yes, when and what did the pharmacy advise?)  This is the patient's preferred pharmacy:   [id number] - [place], [place] - [address] AT [facility] OF [address] [place] [address] Phone: [phone] Fax: [phone]  Is this the correct pharmacy for this prescription? Yes If no, delete pharmacy and type the correct one.   Has the prescription been filled recently? No  Is the patient out of the medication? No  Has the patient been seen for an appointment in the last year OR does the patient have an upcoming appointment? Yes  Can we respond through MyChart? No  Agent: Please be advised that Rx refills may take up to 3 business days. We ask that you follow-up with your pharmacy.

## 2024-07-27 ENCOUNTER — Other Ambulatory Visit (HOSPITAL_COMMUNITY): Payer: Self-pay

## 2024-07-28 ENCOUNTER — Encounter: Payer: Self-pay | Admitting: Physical Therapy

## 2024-07-28 ENCOUNTER — Ambulatory Visit: Admitting: Physical Therapy

## 2024-07-28 DIAGNOSIS — M6281 Muscle weakness (generalized): Secondary | ICD-10-CM

## 2024-07-28 DIAGNOSIS — M5459 Other low back pain: Secondary | ICD-10-CM

## 2024-07-28 DIAGNOSIS — M5416 Radiculopathy, lumbar region: Secondary | ICD-10-CM

## 2024-07-28 NOTE — Therapy (Signed)
 OUTPATIENT PHYSICAL THERAPY THORACOLUMBAR EVALUATION  Patient Name: Lindsay Travis MRN: [id number] DOB:14-Jun-1961, 63 y.o., female Today's Date: 07/28/2024   PT End of Session - 07/28/24 1409     Visit Number 2    Number of Visits --   1-2x/week   Date for Recertification  09/15/24    Authorization Type [employer] MCD    PT Start Time 0210    PT Stop Time 0250    PT Time Calculation (min) 40 min          Past Medical History:  Diagnosis Date   Anemia    Arthritis    bilateral kneed and lower legs   Clotting disorder    Diabetes mellitus    recent high blood sugar and has RX but not taking  med -b/c she doesn't think she  is diabetic, just had lots of sugar in her diet when dx'd.-on meds   Hx of adenomatous polyp of colon 07/2021   Repeat colonoscopy 2029   Hypertension    on meds   Shortness of breath    on exertion- has low hgb   Sickle cell trait    Past Surgical History:  Procedure Laterality Date   ABDOMINAL HYSTERECTOMY  09/11/2011   Procedure: HYSTERECTOMY ABDOMINAL;  Surgeon: Aida DELENA Na, MD;  Location: [facility];  Service: Gynecology;  Laterality: N/A;   COLONOSCOPY WITH PROPOFOL  N/A 07/16/2021   Procedure: COLONOSCOPY WITH PROPOFOL ;  Surgeon: Avram Lupita BRAVO, MD;  Location: [facility];  Service: Endoscopy;  Laterality: N/A;   POLYPECTOMY  07/16/2021   Procedure: POLYPECTOMY;  Surgeon: Avram Lupita BRAVO, MD;  Location: [facility];  Service: Endoscopy;;   Patient Active Problem List   Diagnosis Date Noted   Cellulitis of left lower extremity 03/31/2024   Neuropathic pain 11/19/2023   Bilateral sciatica 11/19/2023   Osteoarthritis of spine with radiculopathy, lumbar region 11/19/2023   Bilateral hip pain 08/28/2023   Overactive bladder 06/25/2023   Dyslipidemia associated with type 2 diabetes mellitus (HCC) 06/19/2022   Statin myopathy 08/01/2021   Obesity, diabetes, and hypertension syndrome (HCC) 01/09/2021   Morbid obesity (HCC) 09/18/2019   Dyslipidemia     Type 2 diabetes mellitus with hyperglycemia, without long-term current use of insulin  (HCC) 08/07/2015   Varicose veins of bilateral lower extremities with other complications 02/24/2012   Uterine leiomyoma 04/18/2011   Hypertension associated with diabetes (HCC) 04/18/2011    PCP: Purcell Emil Schanz, MD  REFERRING PROVIDER: Purcell Emil Schanz, *  THERAPY DIAG:  Other low back pain  Lumbar radiculopathy  Muscle weakness  REFERRING DIAG:  Lumbar radiculopathy [M54.16]   Rationale for Evaluation and Treatment:  Rehabilitation  SUBJECTIVE:  PERTINENT PAST HISTORY:  DM II, HTN      PRECAUTIONS: Fall, spondy  WEIGHT BEARING RESTRICTIONS No  FALLS:  Has patient fallen in last 6 months? Yes, Number of falls: 1  MOI/History of condition:  Onset date: 2 months ago   SUBJECTIVE STATEMENT  Pt reports some slight improvement in pain.  HEP compliant.  From referring provider:     Red flags:  denies BB changes  Pain:  Are you having pain? Yes Pain location: B hips and legs (posterior) NPRS scale:  Worst: 9/10 Aggravating factors: standing, cooking, cleaning, walking, stairs Relieving factors: sitting Pain description: sharp and aching  Occupation: NA  Assistive Device: none  Hand Dominance: NA  Patient Goals/Specific Activities: reduce pain   OBJECTIVE:   DIAGNOSTIC FINDINGS:    GENERAL OBSERVATION/GAIT: Slow antalgic gait  SENSATION: Light touch: Appears intact  LUMBAR AROM  AROM AROM  (Eval)  Flexion Fingertips to toes  Extension limited by 75% increases pain  Right lateral flexion limited by 25% increases pain  Left lateral flexion limited by 25% increases pain  Right rotation limited by 25%  Left rotation limited by 25%    (Blank rows = not tested)   LE MMT:  MMT Right (Eval) Left (Eval)  Hip flexion (L2, L3) 4/5 4/5  Knee extension (L3) 3+/5 3+/5  Knee flexion    Hip abduction 3+/5 3-/5  Hip extension    Hip external  rotation    Hip internal rotation    Hip adduction    Ankle dorsiflexion (L4) C C  Ankle plantarflexion (S1) C C  Ankle inversion    Ankle eversion    Great Toe ext (L5) C C  Grossly     (Blank rows = not tested, score listed is out of 5 possible points.  N = WNL, D = diminished, C = clear for gross weakness with myotome testing, * = concordant pain with testing)  SPECIAL TESTS:  Straight leg raise: L (+), R (-) Slump: L (+), R (-)  MUSCLE LENGTH: Hamstrings: Right no restriction; Left no restriction Hip flexors: Right significant restriction; Left significant restriction  LE ROM:  ROM Right (Eval) Left (Eval)  Hip flexion    Hip extension    Hip abduction    Hip adduction    Hip internal rotation    Hip external rotation    Knee flexion    Knee extension    Ankle dorsiflexion    Ankle plantarflexion    Ankle inversion    Ankle eversion      (Blank rows = not tested, N = WNL, * = concordant pain with testing)  Functional Tests  Eval    30 second sit to stand 6x w/ light UE support                                                             PALPATION:   Tenderness over L lateral hip   PATIENT SURVEYS:  ODI: 24/50  HOME EXERCISE PROGRAM: Access Code: [email or web address] URL: [email or web address] Date: 07/21/2024 Prepared by: Helene Gasmen  Exercises - Supine Lower Trunk Rotation  - 1 x daily - 7 x weekly - 1 sets - 20 reps - 3 hold - Supine Hip Adduction Isometric with Ball  - 1 x daily - 7 x weekly - 2 sets - 10 reps - 10'' hold - Hooklying Isometric Clamshell  - 1 x daily - 7 x weekly - 2 sets - 10 reps - Bridge  - 1 x daily - 7 x weekly - 3 sets - 5 reps - 5-10'' hold  Treatment priorities   Eval        Hip flexor stretching        Flexion based strengthening        aquatics                         TODAY'S TREATMENT:  TREATMENT 07/28/24:  Aquatic therapy at [facility]- [address] - therapeutic pool temp 90-92  degrees   Aquatic Therapy:  Water walking for warm up fwd/lat/bkwds  Hip abd  Hip flexor stretch Step ups fwd and lat L stretch at pool wall Lateral walking with dumbell adduction Bil shoulder ext with YDB Alternating Squats Hip circles CW/CC Heel raises  Yellow noodle stomps  Pt requires the buoyancy of water for active assisted exercises with buoyancy supported for strengthening and AROM exercises. Hydrostatic pressure also supports joints by unweighting joint load by at least 50 % in 3-4 feet depth water. 80% in chest to neck deep water. Water will provide assistance with movement using the current and laminar flow while the buoyancy reduces weight bearing. Pt requires the viscosity of the water for resistance with strengthening exercises.   ASSESSMENT:  CLINICAL IMPRESSION:  07/28/2024:  Session today focused on hip and core strengthening in the aquatic environment for use of buoyancy to offload joints and the viscosity of water as resistance during therapeutic exercise.  Pt reports significant reduction in pain and increase in activity tolerance in water compared to land.  Pt requires curing throughout for pacing and form.  Patient was able to tolerate all prescribed exercises in the aquatic environment with no adverse effects and reports slight reduction in pain at the end of the session. Patient continues to benefit from skilled PT services on land and aquatic based and should be progressed as able to improve functional independence.   EVAL:  Frieda is a 63 y.o. female who presents to clinic with signs and sxs consistent with low back pain with radicular pain secondary to spinal stenosis.   No significant lower quarter weakness with myotome weakness or sensory deficits.   ODI shows severe disability.  Jilene will benefit from skilled PT to address relevant deficits and improve comfort and safety with daily functional mobility .   OBJECTIVE IMPAIRMENTS: Pain, LE/hip/core  strength, gait, balance  ACTIVITY LIMITATIONS: walking, standing, shopping, housework, self care, recreation, social life  PERSONAL FACTORS: See medical history and pertinent history   REHAB POTENTIAL: Good  CLINICAL DECISION MAKING: Evolving/moderate complexity  EVALUATION COMPLEXITY: Moderate   GOALS:   SHORT TERM GOALS: Target date: 08/18/2024   Derisha will be >75% HEP compliant to improve carryover between sessions and facilitate independent management of condition  Evaluation: ongoing Goal status: INITIAL   LONG TERM GOALS: Target date: 09/15/2024   Chani will self report >/= 33% decrease in pain from evaluation to improve function in daily tasks  Evaluation/Baseline: 9/10 max pain Goal status: INITIAL   2.  Riata will show a >/= 8 pt improvement in their ODI score (MCID is 12% or 6/50 pts) as a proxy for functional improvement   Evaluation/Baseline: 24/50 pts Goal status: INITIAL   3.  Sharmain will be able to consistently walk for up to 25 min, not limited by pain, to enable shopping and daily activities  Evaluation/Baseline: 5-15 min Goal status: INITIAL   4.  Destin will improve 30'' STS (MCID 2) to >/= 8x (w/ UE?: y) to show improved LE strength and improved transfers   Evaluation/Baseline: 6x  w/ UE? y Goal status: INITIAL     PLAN: PT FREQUENCY: 1-2x/week  PT DURATION: 8 weeks  PLANNED INTERVENTIONS:  [id number]- PT Re-evaluation, 97110-Therapeutic exercises, 97530- Therapeutic activity, [id number]- Neuromuscular re-education, 97535- Self Care, [id number]- Manual therapy, [id number]- Gait training, [id number]- Aquatic Therapy, [id number]- Electrical stimulation (manual), [id number]- Vasopneumatic device, [id number]- Traction (mechanical), [id number]- Ionotophoresis 4mg /ml Dexamethasone , Taping, Dry Needling, Joint manipulation, and Spinal manipulation.   Helene BRAVO Sindhu Nguyen PT 07/28/2024, 3:01 PM

## 2024-08-04 ENCOUNTER — Ambulatory Visit: Admitting: Physical Therapy

## 2024-08-04 ENCOUNTER — Telehealth: Payer: Self-pay

## 2024-08-04 ENCOUNTER — Encounter: Payer: Self-pay | Admitting: Physical Therapy

## 2024-08-04 DIAGNOSIS — M5459 Other low back pain: Secondary | ICD-10-CM | POA: Diagnosis not present

## 2024-08-04 DIAGNOSIS — M6281 Muscle weakness (generalized): Secondary | ICD-10-CM

## 2024-08-04 DIAGNOSIS — M5416 Radiculopathy, lumbar region: Secondary | ICD-10-CM

## 2024-08-04 NOTE — Telephone Encounter (Signed)
 Copied from CRM [id number]. Topic: Clinical - Medication Question >> Aug 04, 2024 10:34 AM Alexandria E wrote: Reason for CRM: Patient called in regarding her Farxiga  (agent could not locate this medication on med list). Patient just left the pharmacy and she was not able to pick this up as they stated that her insurance company sent a message over the PCP's office and they are awaiting a reply.

## 2024-08-04 NOTE — Therapy (Signed)
 OUTPATIENT PHYSICAL THERAPY THORACOLUMBAR EVALUATION  Patient Name: Colleen Kotlarz MRN: [id number] DOB:[birth year], 63 y.o., female Today's Date: 08/04/2024   PT End of Session - 08/04/24 1431     Visit Number 3    Number of Visits --   1-2x/week   Date for Recertification  09/15/24    Authorization Type [employer] MCD    PT Start Time 1430   pt arrived late   PT Stop Time 1457    PT Time Calculation (min) 27 min           Past Medical History:  Diagnosis Date   Anemia    Arthritis    bilateral kneed and lower legs   Clotting disorder    Diabetes mellitus    recent high blood sugar and has RX but not taking  med -b/c she doesn't think she  is diabetic, just had lots of sugar in her diet when dx'd.-on meds   Hx of adenomatous polyp of colon 07/2021   Repeat colonoscopy 2029   Hypertension    on meds   Shortness of breath    on exertion- has low hgb   Sickle cell trait    Past Surgical History:  Procedure Laterality Date   ABDOMINAL HYSTERECTOMY  09/11/2011   Procedure: HYSTERECTOMY ABDOMINAL;  Surgeon: Aida DELENA Na, MD;  Location: [facility];  Service: Gynecology;  Laterality: N/A;   COLONOSCOPY WITH PROPOFOL  N/A 07/16/2021   Procedure: COLONOSCOPY WITH PROPOFOL ;  Surgeon: Avram Lupita BRAVO, MD;  Location: [facility];  Service: Endoscopy;  Laterality: N/A;   POLYPECTOMY  07/16/2021   Procedure: POLYPECTOMY;  Surgeon: Avram Lupita BRAVO, MD;  Location: [facility];  Service: Endoscopy;;   Patient Active Problem List   Diagnosis Date Noted   Cellulitis of left lower extremity 03/31/2024   Neuropathic pain 11/19/2023   Bilateral sciatica 11/19/2023   Osteoarthritis of spine with radiculopathy, lumbar region 11/19/2023   Bilateral hip pain 08/28/2023   Overactive bladder 06/25/2023   Dyslipidemia associated with type 2 diabetes mellitus (HCC) 06/19/2022   Statin myopathy 08/01/2021   Obesity, diabetes, and hypertension syndrome (HCC) 01/09/2021   Morbid obesity (HCC)  09/18/2019   Dyslipidemia    Type 2 diabetes mellitus with hyperglycemia, without long-term current use of insulin  (HCC) 08/07/2015   Varicose veins of bilateral lower extremities with other complications 02/24/2012   Uterine leiomyoma 04/18/2011   Hypertension associated with diabetes (HCC) 04/18/2011    PCP: Purcell Emil Schanz, MD  REFERRING PROVIDER: Purcell Emil Schanz, *  THERAPY DIAG:  Other low back pain  Lumbar radiculopathy  Muscle weakness  REFERRING DIAG:  Lumbar radiculopathy [M54.16]   Rationale for Evaluation and Treatment:  Rehabilitation  SUBJECTIVE:  PERTINENT PAST HISTORY:  DM II, HTN      PRECAUTIONS: Fall, spondy  WEIGHT BEARING RESTRICTIONS No  FALLS:  Has patient fallen in last 6 months? Yes, Number of falls: 1  MOI/History of condition:  Onset date: 2 months ago   SUBJECTIVE STATEMENT  Pt reports that she felt good after the pool last visit.  From referring provider:     Red flags:  denies BB changes  Pain:  Are you having pain? Yes Pain location: B hips and legs (posterior) NPRS scale:  Worst: 9/10 Aggravating factors: standing, cooking, cleaning, walking, stairs Relieving factors: sitting Pain description: sharp and aching  Occupation: NA  Assistive Device: none  Hand Dominance: NA  Patient Goals/Specific Activities: reduce pain   OBJECTIVE:   DIAGNOSTIC FINDINGS:  GENERAL OBSERVATION/GAIT: Slow antalgic gait  SENSATION: Light touch: Appears intact  LUMBAR AROM  AROM AROM  (Eval)  Flexion Fingertips to toes  Extension limited by 75% increases pain  Right lateral flexion limited by 25% increases pain  Left lateral flexion limited by 25% increases pain  Right rotation limited by 25%  Left rotation limited by 25%    (Blank rows = not tested)   LE MMT:  MMT Right (Eval) Left (Eval)  Hip flexion (L2, L3) 4/5 4/5  Knee extension (L3) 3+/5 3+/5  Knee flexion    Hip abduction 3+/5 3-/5  Hip  extension    Hip external rotation    Hip internal rotation    Hip adduction    Ankle dorsiflexion (L4) C C  Ankle plantarflexion (S1) C C  Ankle inversion    Ankle eversion    Great Toe ext (L5) C C  Grossly     (Blank rows = not tested, score listed is out of 5 possible points.  N = WNL, D = diminished, C = clear for gross weakness with myotome testing, * = concordant pain with testing)  SPECIAL TESTS:  Straight leg raise: L (+), R (-) Slump: L (+), R (-)  MUSCLE LENGTH: Hamstrings: Right no restriction; Left no restriction Hip flexors: Right significant restriction; Left significant restriction  LE ROM:  ROM Right (Eval) Left (Eval)  Hip flexion    Hip extension    Hip abduction    Hip adduction    Hip internal rotation    Hip external rotation    Knee flexion    Knee extension    Ankle dorsiflexion    Ankle plantarflexion    Ankle inversion    Ankle eversion      (Blank rows = not tested, N = WNL, * = concordant pain with testing)  Functional Tests  Eval    30 second sit to stand 6x w/ light UE support                                                             PALPATION:   Tenderness over L lateral hip   PATIENT SURVEYS:  ODI: 24/50  HOME EXERCISE PROGRAM: Access Code: [email or web address] URL: [email or web address] Date: 07/21/2024 Prepared by: Helene Gasmen  Exercises - Supine Lower Trunk Rotation  - 1 x daily - 7 x weekly - 1 sets - 20 reps - 3 hold - Supine Hip Adduction Isometric with Ball  - 1 x daily - 7 x weekly - 2 sets - 10 reps - 10'' hold - Hooklying Isometric Clamshell  - 1 x daily - 7 x weekly - 2 sets - 10 reps - Bridge  - 1 x daily - 7 x weekly - 3 sets - 5 reps - 5-10'' hold  Treatment priorities   Eval        Hip flexor stretching        Flexion based strengthening        aquatics                         TODAY'S TREATMENT:  TREATMENT 08/04/24:  Aquatic therapy at [facility]- [address] -  therapeutic pool temp 90-92 degrees   Aquatic Therapy:  Water walking for  warm up fwd/lat/bkwds  Hip abd Lunge stepping lat EOP Hip flexor stretch Step ups fwd and lat L stretch at pool wall Hip flexion knee straight Yellow noodle push down Squats Hip circles CW/CC Heel raises  Yellow noodle stomps Noodle rotations  Pt requires the buoyancy of water for active assisted exercises with buoyancy supported for strengthening and AROM exercises. Hydrostatic pressure also supports joints by unweighting joint load by at least 50 % in 3-4 feet depth water. 80% in chest to neck deep water. Water will provide assistance with movement using the current and laminar flow while the buoyancy reduces weight bearing. Pt requires the viscosity of the water for resistance with strengthening exercises.   ASSESSMENT:  CLINICAL IMPRESSION:  08/04/2024:  Session today focused on hip and core strengthening in the aquatic environment for use of buoyancy to offload joints and the viscosity of water as resistance during therapeutic exercise.  Pt with lower pain levels today and shows improved hip ROM and velocity during exercise.  Continues to require verbal and tactile cuing for form throughout.  Patient was able to tolerate all prescribed exercises in the aquatic environment with no adverse effects and reports slight reduction in pain at the end of the session. Patient continues to benefit from skilled PT services on land and aquatic based and should be progressed as able to improve functional independence.   EVAL:  Corynn is a 63 y.o. female who presents to clinic with signs and sxs consistent with low back pain with radicular pain secondary to spinal stenosis.   No significant lower quarter weakness with myotome weakness or sensory deficits.   ODI shows severe disability.  Aleah will benefit from skilled PT to address relevant deficits and improve comfort and safety with daily functional mobility .    OBJECTIVE IMPAIRMENTS: Pain, LE/hip/core strength, gait, balance  ACTIVITY LIMITATIONS: walking, standing, shopping, housework, self care, recreation, social life  PERSONAL FACTORS: See medical history and pertinent history   REHAB POTENTIAL: Good  CLINICAL DECISION MAKING: Evolving/moderate complexity  EVALUATION COMPLEXITY: Moderate   GOALS:   SHORT TERM GOALS: Target date: 08/18/2024   Nadea will be >75% HEP compliant to improve carryover between sessions and facilitate independent management of condition  Evaluation: ongoing Goal status: MET   LONG TERM GOALS: Target date: 09/15/2024   Tacha will self report >/= 33% decrease in pain from evaluation to improve function in daily tasks  Evaluation/Baseline: 9/10 max pain Goal status: INITIAL   2.  Maelyn will show a >/= 8 pt improvement in their ODI score (MCID is 12% or 6/50 pts) as a proxy for functional improvement   Evaluation/Baseline: 24/50 pts Goal status: INITIAL   3.  Malorie will be able to consistently walk for up to 25 min, not limited by pain, to enable shopping and daily activities  Evaluation/Baseline: 5-15 min Goal status: INITIAL   4.  Judyth will improve 30'' STS (MCID 2) to >/= 8x (w/ UE?: y) to show improved LE strength and improved transfers   Evaluation/Baseline: 6x  w/ UE? y Goal status: INITIAL     PLAN: PT FREQUENCY: 1-2x/week  PT DURATION: 8 weeks  PLANNED INTERVENTIONS:  [id number]- PT Re-evaluation, 97110-Therapeutic exercises, 97530- Therapeutic activity, [id number]- Neuromuscular re-education, 97535- Self Care, 02859- Manual therapy, [id number]- Gait training, [id number]- Aquatic Therapy, [id number]- Electrical stimulation (manual), [id number]- Vasopneumatic device, [id number]- Traction (mechanical), [id number]- Ionotophoresis 4mg /ml Dexamethasone , Taping, Dry Needling, Joint manipulation, and Spinal manipulation.   Helene BRAVO Ayaan Ringle PT 08/04/2024, 3:05 PM

## 2024-08-09 ENCOUNTER — Other Ambulatory Visit: Payer: Self-pay

## 2024-08-09 NOTE — Telephone Encounter (Signed)
 Patient comes in on the. She is requesting gabapentin  or something for pain. Also is ok to refill her farxiga 

## 2024-08-10 ENCOUNTER — Other Ambulatory Visit: Payer: Self-pay

## 2024-08-10 ENCOUNTER — Ambulatory Visit: Admitting: Physical Therapy

## 2024-08-10 DIAGNOSIS — M5416 Radiculopathy, lumbar region: Secondary | ICD-10-CM | POA: Insufficient documentation

## 2024-08-10 DIAGNOSIS — M6281 Muscle weakness (generalized): Secondary | ICD-10-CM | POA: Insufficient documentation

## 2024-08-10 DIAGNOSIS — M5459 Other low back pain: Secondary | ICD-10-CM | POA: Insufficient documentation

## 2024-08-10 MED ORDER — FARXIGA 10 MG PO TABS: 10.0000 mg | ORAL_TABLET | Freq: Every day | ORAL | 0 refills | Status: DC

## 2024-08-10 NOTE — Telephone Encounter (Signed)
 Call patient and find out more of what she is referring to.  It is okay to refill Farxiga  10 mg.  It may need PA.  Requesting gabapentin  for what kind of pain?

## 2024-08-11 ENCOUNTER — Ambulatory Visit: Admitting: Physical Therapy

## 2024-08-11 ENCOUNTER — Other Ambulatory Visit: Payer: Self-pay | Admitting: Emergency Medicine

## 2024-08-11 ENCOUNTER — Encounter: Payer: Self-pay | Admitting: Physical Therapy

## 2024-08-11 DIAGNOSIS — M6281 Muscle weakness (generalized): Secondary | ICD-10-CM | POA: Diagnosis present

## 2024-08-11 DIAGNOSIS — M5416 Radiculopathy, lumbar region: Secondary | ICD-10-CM | POA: Diagnosis present

## 2024-08-11 DIAGNOSIS — M5459 Other low back pain: Secondary | ICD-10-CM

## 2024-08-11 MED ORDER — GABAPENTIN 300 MG PO CAPS: 300.0000 mg | ORAL_CAPSULE | Freq: Two times a day (BID) | ORAL | 1 refills | Status: AC

## 2024-08-11 NOTE — Telephone Encounter (Signed)
 New prescription for gabapentin  sent to pharmacy of record today

## 2024-08-11 NOTE — Therapy (Signed)
 OUTPATIENT PHYSICAL THERAPY THORACOLUMBAR EVALUATION  Patient Name: Lindsay Travis MRN: [id number] DOB:[birth year], 63 y.o., female Today's Date: 08/11/2024   PT End of Session - 08/11/24 1401     Visit Number 4    Number of Visits --   1-2x/week   Date for Recertification  09/15/24    Authorization Type [employer] MCD    PT Start Time 1400    PT Stop Time 1441    PT Time Calculation (min) 41 min            Past Medical History:  Diagnosis Date   Anemia    Arthritis    bilateral kneed and lower legs   Clotting disorder    Diabetes mellitus    recent high blood sugar and has RX but not taking  med -b/c she doesn't think she  is diabetic, just had lots of sugar in her diet when dx'd.-on meds   Hx of adenomatous polyp of colon 07/2021   Repeat colonoscopy 2029   Hypertension    on meds   Shortness of breath    on exertion- has low hgb   Sickle cell trait    Past Surgical History:  Procedure Laterality Date   ABDOMINAL HYSTERECTOMY  09/11/2011   Procedure: HYSTERECTOMY ABDOMINAL;  Surgeon: Aida DELENA Na, MD;  Location: [facility];  Service: Gynecology;  Laterality: N/A;   COLONOSCOPY WITH PROPOFOL  N/A 07/16/2021   Procedure: COLONOSCOPY WITH PROPOFOL ;  Surgeon: Avram Lupita BRAVO, MD;  Location: [facility];  Service: Endoscopy;  Laterality: N/A;   POLYPECTOMY  07/16/2021   Procedure: POLYPECTOMY;  Surgeon: Avram Lupita BRAVO, MD;  Location: [facility];  Service: Endoscopy;;   Patient Active Problem List   Diagnosis Date Noted   Cellulitis of left lower extremity 03/31/2024   Neuropathic pain 11/19/2023   Bilateral sciatica 11/19/2023   Osteoarthritis of spine with radiculopathy, lumbar region 11/19/2023   Bilateral hip pain 08/28/2023   Overactive bladder 06/25/2023   Dyslipidemia associated with type 2 diabetes mellitus (HCC) 06/19/2022   Statin myopathy 08/01/2021   Obesity, diabetes, and hypertension syndrome (HCC) 01/09/2021   Morbid obesity (HCC) 09/18/2019    Dyslipidemia    Type 2 diabetes mellitus with hyperglycemia, without long-term current use of insulin  (HCC) 08/07/2015   Varicose veins of bilateral lower extremities with other complications 02/24/2012   Uterine leiomyoma 04/18/2011   Hypertension associated with diabetes (HCC) 04/18/2011    PCP: Purcell Emil Schanz, MD  REFERRING PROVIDER: Purcell Emil Schanz, *  THERAPY DIAG:  Other low back pain  Lumbar radiculopathy  Muscle weakness  REFERRING DIAG:  Lumbar radiculopathy [M54.16]   Rationale for Evaluation and Treatment:  Rehabilitation  SUBJECTIVE:  PERTINENT PAST HISTORY:  DM II, HTN      PRECAUTIONS: Fall, spondy  WEIGHT BEARING RESTRICTIONS No  FALLS:  Has patient fallen in last 6 months? Yes, Number of falls: 1  MOI/History of condition:  Onset date: 2 months ago   SUBJECTIVE STATEMENT  Pt reports continued benefit from pool but has been having more widespread pain recently, even in her shoulders  From referring provider:     Red flags:  denies BB changes  Pain:  Are you having pain? Yes Pain location: B hips and legs (posterior) NPRS scale:  Worst: 9/10 Aggravating factors: standing, cooking, cleaning, walking, stairs Relieving factors: sitting Pain description: sharp and aching  Occupation: NA  Assistive Device: none  Hand Dominance: NA  Patient Goals/Specific Activities: reduce pain   OBJECTIVE:   DIAGNOSTIC  FINDINGS:    GENERAL OBSERVATION/GAIT: Slow antalgic gait  SENSATION: Light touch: Appears intact  LUMBAR AROM  AROM AROM  (Eval)  Flexion Fingertips to toes  Extension limited by 75% increases pain  Right lateral flexion limited by 25% increases pain  Left lateral flexion limited by 25% increases pain  Right rotation limited by 25%  Left rotation limited by 25%    (Blank rows = not tested)   LE MMT:  MMT Right (Eval) Left (Eval)  Hip flexion (L2, L3) 4/5 4/5  Knee extension (L3) 3+/5 3+/5  Knee  flexion    Hip abduction 3+/5 3-/5  Hip extension    Hip external rotation    Hip internal rotation    Hip adduction    Ankle dorsiflexion (L4) C C  Ankle plantarflexion (S1) C C  Ankle inversion    Ankle eversion    Great Toe ext (L5) C C  Grossly     (Blank rows = not tested, score listed is out of 5 possible points.  N = WNL, D = diminished, C = clear for gross weakness with myotome testing, * = concordant pain with testing)  SPECIAL TESTS:  Straight leg raise: L (+), R (-) Slump: L (+), R (-)  MUSCLE LENGTH: Hamstrings: Right no restriction; Left no restriction Hip flexors: Right significant restriction; Left significant restriction  LE ROM:  ROM Right (Eval) Left (Eval)  Hip flexion    Hip extension    Hip abduction    Hip adduction    Hip internal rotation    Hip external rotation    Knee flexion    Knee extension    Ankle dorsiflexion    Ankle plantarflexion    Ankle inversion    Ankle eversion      (Blank rows = not tested, N = WNL, * = concordant pain with testing)  Functional Tests  Eval    30 second sit to stand 6x w/ light UE support                                                             PALPATION:   Tenderness over L lateral hip   PATIENT SURVEYS:  ODI: 24/50  HOME EXERCISE PROGRAM: Access Code: [email or web address] URL: [email or web address] Date: 07/21/2024 Prepared by: Helene Gasmen  Exercises - Supine Lower Trunk Rotation  - 1 x daily - 7 x weekly - 1 sets - 20 reps - 3 hold - Supine Hip Adduction Isometric with Ball  - 1 x daily - 7 x weekly - 2 sets - 10 reps - 10'' hold - Hooklying Isometric Clamshell  - 1 x daily - 7 x weekly - 2 sets - 10 reps - Bridge  - 1 x daily - 7 x weekly - 3 sets - 5 reps - 5-10'' hold  Treatment priorities   Eval        Hip flexor stretching        Flexion based strengthening        aquatics                         TODAY'S TREATMENT:  TREATMENT 08/11/24:  Aquatic  therapy at [facility]- [address] - therapeutic pool temp 90-92 degrees   Aquatic Therapy:  Water walking for warm up fwd/lat/bkwds  Hip abd Lunge stepping lat EOP Hip abd Step ups fwd and lat Hip flexion knee straight Yellow noodle push down Squats Shoulder adduction/ext/alt ext - RDB Hip circles CW/CC Heel raises  Yellow noodle stomps Water jogging - 5 min total with DB fwd and lat  Pt requires the buoyancy of water for active assisted exercises with buoyancy supported for strengthening and AROM exercises. Hydrostatic pressure also supports joints by unweighting joint load by at least 50 % in 3-4 feet depth water. 80% in chest to neck deep water. Water will provide assistance with movement using the current and laminar flow while the buoyancy reduces weight bearing. Pt requires the viscosity of the water for resistance with strengthening exercises.   ASSESSMENT:  CLINICAL IMPRESSION:  08/11/2024:  Session today focused on hip and core strengthening in the aquatic environment for use of buoyancy to offload joints and the viscosity of water as resistance during therapeutic exercise.  Able to increase overall volume today including shoulder strengthening combined with abdominal contraction as well as water jogging for endurance and activity tolerance.  Pt reports no increase in pain during session. Patient continues to benefit from skilled PT services on land and aquatic based and should be progressed as able to improve functional independence.   EVAL:  Ennifer is a 63 y.o. female who presents to clinic with signs and sxs consistent with low back pain with radicular pain secondary to spinal stenosis.   No significant lower quarter weakness with myotome weakness or sensory deficits.   ODI shows severe disability.  Lilya will benefit from skilled PT to address relevant deficits and improve comfort and safety with daily functional mobility .   OBJECTIVE IMPAIRMENTS: Pain,  LE/hip/core strength, gait, balance  ACTIVITY LIMITATIONS: walking, standing, shopping, housework, self care, recreation, social life  PERSONAL FACTORS: See medical history and pertinent history   REHAB POTENTIAL: Good  CLINICAL DECISION MAKING: Evolving/moderate complexity  EVALUATION COMPLEXITY: Moderate   GOALS:   SHORT TERM GOALS: Target date: 08/18/2024   [place] will be >75% HEP compliant to improve carryover between sessions and facilitate independent management of condition  Evaluation: ongoing Goal status: MET   LONG TERM GOALS: Target date: 09/15/2024   Marlana will self report >/= 33% decrease in pain from evaluation to improve function in daily tasks  Evaluation/Baseline: 9/10 max pain Goal status: INITIAL   2.  Nihira will show a >/= 8 pt improvement in their ODI score (MCID is 12% or 6/50 pts) as a proxy for functional improvement   Evaluation/Baseline: 24/50 pts Goal status: INITIAL   3.  Talayah will be able to consistently walk for up to 25 min, not limited by pain, to enable shopping and daily activities  Evaluation/Baseline: 5-15 min Goal status: INITIAL   4.  Mariann will improve 30'' STS (MCID 2) to >/= 8x (w/ UE?: y) to show improved LE strength and improved transfers   Evaluation/Baseline: 6x  w/ UE? y Goal status: INITIAL     PLAN: PT FREQUENCY: 1-2x/week  PT DURATION: 8 weeks  PLANNED INTERVENTIONS:  [id number]- PT Re-evaluation, 97110-Therapeutic exercises, 97530- Therapeutic activity, [id number]- Neuromuscular re-education, 97535- Self Care, [id number]- Manual therapy, [id number]- Gait training, [id number]- Aquatic Therapy, [id number]- Electrical stimulation (manual), [id number]- Vasopneumatic device, [id number]- Traction (mechanical), [id number]- Ionotophoresis 4mg /ml Dexamethasone , Taping, Dry Needling, Joint manipulation, and Spinal manipulation.   Stina Gane E Ido Wollman PT 08/11/2024, 2:01 PM

## 2024-08-11 NOTE — Telephone Encounter (Signed)
Neuropathy pain

## 2024-08-12 ENCOUNTER — Ambulatory Visit: Admitting: Emergency Medicine

## 2024-08-12 ENCOUNTER — Encounter: Payer: Self-pay | Admitting: Emergency Medicine

## 2024-08-12 VITALS — BP 160/100 | HR 74 | Temp 98.1°F | Ht 66.0 in | Wt 230.0 lb

## 2024-08-12 DIAGNOSIS — E1165 Type 2 diabetes mellitus with hyperglycemia: Secondary | ICD-10-CM

## 2024-08-12 DIAGNOSIS — E1169 Type 2 diabetes mellitus with other specified complication: Secondary | ICD-10-CM

## 2024-08-12 DIAGNOSIS — I152 Hypertension secondary to endocrine disorders: Secondary | ICD-10-CM

## 2024-08-12 DIAGNOSIS — M792 Neuralgia and neuritis, unspecified: Secondary | ICD-10-CM

## 2024-08-12 DIAGNOSIS — M4726 Other spondylosis with radiculopathy, lumbar region: Secondary | ICD-10-CM

## 2024-08-12 LAB — COMPREHENSIVE METABOLIC PANEL WITH GFR
ALT: 19 U/L (ref 0–35)
AST: 17 U/L (ref 0–37)
Albumin: 4.3 g/dL (ref 3.5–5.2)
Alkaline Phosphatase: 51 U/L (ref 39–117)
BUN: 13 mg/dL (ref 6–23)
CO2: 30 meq/L (ref 19–32)
Calcium: 9.6 mg/dL (ref 8.4–10.5)
Chloride: 103 meq/L (ref 96–112)
Creatinine, Ser: 0.72 mg/dL (ref 0.40–1.20)
GFR: 89.63 mL/min (ref >60.00)
Glucose, Bld: 90 mg/dL (ref 70–99)
Potassium: 3.5 meq/L (ref 3.5–5.1)
Sodium: 143 meq/L (ref 135–145)
Total Bilirubin: 0.4 mg/dL (ref 0.2–1.2)
Total Protein: 7.1 g/dL (ref 6.0–8.3)

## 2024-08-12 LAB — CBC WITH DIFFERENTIAL/PLATELET
Basophils Absolute: 0 K/uL (ref 0.0–0.1)
Basophils Relative: 1.1 % (ref 0.0–3.0)
Eosinophils Absolute: 0.1 K/uL (ref 0.0–0.7)
Eosinophils Relative: 1.5 % (ref 0.0–5.0)
HCT: 40.7 % (ref 36.0–46.0)
Hemoglobin: 13.5 g/dL (ref 12.0–15.0)
Lymphocytes Relative: 53 % — ABNORMAL HIGH (ref 12.0–46.0)
Lymphs Abs: 2.1 K/uL (ref 0.7–4.0)
MCHC: 33.2 g/dL (ref 30.0–36.0)
MCV: 83.1 fl (ref 78.0–100.0)
Monocytes Absolute: 0.3 K/uL (ref 0.1–1.0)
Monocytes Relative: 8.1 % (ref 3.0–12.0)
Neutro Abs: 1.4 K/uL (ref 1.4–7.7)
Neutrophils Relative %: 36.3 % — ABNORMAL LOW (ref 43.0–77.0)
Platelets: 203 K/uL (ref 150.0–400.0)
RBC: 4.9 Mil/uL (ref 3.87–5.11)
RDW: 14.4 % (ref 11.5–15.5)
WBC: 3.9 K/uL — ABNORMAL LOW (ref 4.0–10.5)

## 2024-08-12 LAB — POCT GLYCOSYLATED HEMOGLOBIN (HGB A1C): HbA1c POC (<> result, manual entry): 8 % (ref 4.0–5.6)

## 2024-08-12 LAB — MICROALBUMIN / CREATININE URINE RATIO
Creatinine,U: 42.4 mg/dL
Microalb Creat Ratio: UNDETERMINED mg/g (ref 0.0–30.0)
Microalb, Ur: <0.7 mg/dL

## 2024-08-12 MED ORDER — AMLODIPINE BESYLATE 10 MG PO TABS: 10.0000 mg | ORAL_TABLET | Freq: Every day | ORAL | 3 refills | Status: AC

## 2024-08-12 MED ORDER — FARXIGA 10 MG PO TABS: 10.0000 mg | ORAL_TABLET | Freq: Every day | ORAL | 0 refills | Status: AC

## 2024-08-12 NOTE — Patient Instructions (Signed)

## 2024-08-12 NOTE — Telephone Encounter (Signed)
Called patient and notified her of this

## 2024-08-12 NOTE — Assessment & Plan Note (Signed)
 Hemoglobin A1c at 8.0, still not at goal Continue glipizide  5 mg twice, metformin  1000 mg twice a day and Farxiga  10 mg daily Intolerant to statins

## 2024-08-12 NOTE — Assessment & Plan Note (Signed)
 Associated with hypertension and diabetes Diet and nutrition discussed benefit Benefits of exercise discussed Advised to decrease amount of daily carbohydrate intake and daily calories and increase amount of plant-based protein in her diet Follow-up in 6 months

## 2024-08-12 NOTE — Assessment & Plan Note (Signed)
 Still active and affecting quality of life Recommend gabapentin  300 mg twice a day

## 2024-08-12 NOTE — Assessment & Plan Note (Signed)
 BP Readings from Last 3 Encounters:  08/12/24 (!) 160/100  05/20/24 (!) 142/85  03/31/24 (!) 138/90  Elevated blood pressure readings Recommend to increase amlodipine  to 10 mg daily and continue hydrochlorothiazide  12.5 mg Uncontrolled diabetes with hemoglobin A1c higher than before at 8.0 Continue glipizide  5 mg twice a day and metformin  1000 mg daily Continue Farxiga  10 mg daily Diet and nutrition discussed Cardiovascular risks associated with hypertension and diabetes discussed

## 2024-08-12 NOTE — Progress Notes (Signed)
 Lindsay Travis 63 y.o.   Chief Complaint  Patient presents with   Follow-up    Pt would to have her kidney checked out as well     HISTORY OF PRESENT ILLNESS: This is a 63 y.o. female here for 57-month follow-up of chronic medical conditions including hypertension and diabetes Still struggling with chronic low back pain Seeing orthopedic surgeon on a regular basis.  Was recommended physical therapy before considering surgery. No other complaints or medical concerns today.  HPI   Prior to Admission medications   Medication Sig Start Date End Date Taking? Authorizing Provider  amLODipine  (NORVASC ) 10 MG tablet Take 1 tablet (10 mg total) by mouth daily. 08/12/24  Yes Aara Jacquot, Emil Schanz, MD  Ascorbic Acid (VITAMIN C) 1000 MG tablet Take 1,000 mg by mouth 2 (two) times a week.   Yes [provider]  aspirin  81 MG EC tablet Take 1 tablet (81 mg total) by mouth daily. 03/20/18  Yes Whitfield Raisin, NP  blood glucose meter kit and supplies Per insurance preference. Check daily fasting blood glucose. (Dx. E11.9). 03/20/22  Yes SagardiaEmil Schanz, MD  Blood Glucose Monitoring Suppl DEVI 1 each by Does not apply route daily. May substitute to any manufacturer covered by patient's insurance. 02/13/24  Yes Kadden Osterhout, Emil Schanz, MD  ezetimibe  (ZETIA ) 10 MG tablet Take 1 tablet (10 mg total) by mouth daily. 03/08/24  Yes Zacory Fiola, Emil Schanz, MD  famotidine  (PEPCID ) 20 MG tablet Take 1 tablet (20 mg total) by mouth 2 (two) times daily. 11/05/22  Yes Roemhildt, Lorin T, PA-C  gabapentin  (NEURONTIN ) 300 MG capsule Take 1 capsule (300 mg total) by mouth 2 (two) times daily. 08/11/24  Yes Eura Mccauslin, Emil Schanz, MD  glipiZIDE  (GLUCOTROL ) 5 MG tablet TAKE 1 TABLET (5 MG TOTAL) BY MOUTH TWICE A DAY BEFORE MEALS 02/16/24  Yes Hennie Gosa, Emil Schanz, MD  Glucose Blood (BLOOD GLUCOSE TEST STRIPS) STRP 1 each by In Vitro route daily at 6 (six) AM. May substitute to any manufacturer covered by patient's  insurance. 02/13/24  Yes Milen Lengacher, Emil Schanz, MD  hydrochlorothiazide  (HYDRODIURIL ) 12.5 MG tablet Take 1 tablet (12.5 mg total) by mouth daily. 03/04/24  Yes Bruce Churilla, Emil Schanz, MD  Lancets Misc. MISC 1 each by Does not apply route daily at 6 (six) AM. May substitute to any manufacturer covered by patient's insurance. 02/13/24  Yes SagardiaEmil Schanz, MD  Melatonin 10 MG CAPS Take by mouth.   Yes [provider]  metFORMIN  (GLUCOPHAGE -XR) 500 MG 24 hr tablet Take 2 tablets (1,000 mg total) by mouth daily with breakfast. 02/13/24  Yes Demontrae Gilbert, Emil Schanz, MD  Omega-3 Fatty Acids (OMEGA-3 FISH OIL PO) Take 1 capsule by mouth in the morning.   Yes [provider]  FARXIGA  10 MG TABS tablet Take 1 tablet (10 mg total) by mouth daily. 08/12/24   Purcell Emil Schanz, MD  oxybutynin  (DITROPAN ) 5 MG tablet Take 1 tablet (5 mg total) by mouth every 8 (eight) hours as needed for bladder spasms. Patient not taking: Reported on 08/12/2024 06/25/23   Purcell Emil Schanz, MD  rosuvastatin  (CRESTOR ) 10 MG tablet Take 1 tablet (10 mg total) by mouth daily. Patient not taking: Reported on 08/12/2024 03/05/24   Thamar Holik Jose, MD  traMADol  (ULTRAM ) 50 MG tablet Take 1 tablet (50 mg total) by mouth every 6 (six) hours as needed (pain). Patient not taking: Reported on 08/12/2024 10/13/23   Cleatrice Ludie SAUNDERS, MD    Allergies  Allergen Reactions  Lisinopril Other (See Comments)    Did not feel good   Losartan  Other (See Comments)    headache   Rosuvastatin  Other (See Comments)    Leg pain     Aspirin  Other (See Comments)    Abdominal pain only 325 mg     Patient Active Problem List   Diagnosis Date Noted   Neuropathic pain 11/19/2023   Bilateral sciatica 11/19/2023   Osteoarthritis of spine with radiculopathy, lumbar region 11/19/2023   Bilateral hip pain 08/28/2023   Overactive bladder 06/25/2023   Dyslipidemia associated with type 2 diabetes mellitus (HCC) 06/19/2022    Statin myopathy 08/01/2021   Obesity, diabetes, and hypertension syndrome (HCC) 01/09/2021   Morbid obesity (HCC) 09/18/2019   Type 2 diabetes mellitus with hyperglycemia, without long-term current use of insulin  (HCC) 08/07/2015   Varicose veins of bilateral lower extremities with other complications 02/24/2012   Uterine leiomyoma 04/18/2011   Hypertension associated with diabetes (HCC) 04/18/2011    Past Medical History:  Diagnosis Date   Anemia    Arthritis    bilateral kneed and lower legs   Clotting disorder    Diabetes mellitus    recent high blood sugar and has RX but not taking  med -b/c she doesn't think she  is diabetic, just had lots of sugar in her diet when dx'd.-on meds   Hx of adenomatous polyp of colon 07/2021   Repeat colonoscopy 2029   Hypertension    on meds   Shortness of breath    on exertion- has low hgb   Sickle cell trait     Past Surgical History:  Procedure Laterality Date   ABDOMINAL HYSTERECTOMY  09/11/2011   Procedure: HYSTERECTOMY ABDOMINAL;  Surgeon: Aida DELENA Na, MD;  Location: [facility];  Service: Gynecology;  Laterality: N/A;   COLONOSCOPY WITH PROPOFOL  N/A 07/16/2021   Procedure: COLONOSCOPY WITH PROPOFOL ;  Surgeon: Avram Lupita BRAVO, MD;  Location: [facility];  Service: Endoscopy;  Laterality: N/A;   POLYPECTOMY  07/16/2021   Procedure: POLYPECTOMY;  Surgeon: Avram Lupita BRAVO, MD;  Location: [facility];  Service: Endoscopy;;    Social History   Socioeconomic History   Marital status: Divorced    Spouse name: Not on file   Number of children: Not on file   Years of education: Not on file   Highest education level: Not on file  Occupational History   Not on file  Tobacco Use   Smoking status: Never   Smokeless tobacco: Never  Vaping Use   Vaping status: Never Used  Substance and Sexual Activity   Alcohol use: No   Drug use: No   Sexual activity: Never  Other Topics Concern   Not on file  Social History Narrative   Not on file    Social Drivers of Health   Financial Resource Strain: Not on file  Food Insecurity: Not on file  Transportation Needs: Not on file  Physical Activity: Not on file  Stress: Not on file  Social Connections: Not on file  Intimate Partner Violence: Not on file    Family History  Problem Relation Age of Onset   Hypertension Other    Colon polyps Neg Hx    Colon cancer Neg Hx    Heart disease Neg Hx    Esophageal cancer Neg Hx    Rectal cancer Neg Hx    Stomach cancer Neg Hx      Review of Systems  Constitutional: Negative.  Negative for chills and fever.  HENT: Negative.  Negative for congestion and sore throat.   Respiratory: Negative.  Negative for cough and shortness of breath.   Cardiovascular: Negative.  Negative for chest pain and palpitations.  Gastrointestinal:  Negative for abdominal pain, diarrhea, nausea and vomiting.  Genitourinary: Negative.  Negative for dysuria and hematuria.  Musculoskeletal:  Positive for back pain.  Skin: Negative.  Negative for rash.  Neurological: Negative.  Negative for dizziness and headaches.  All other systems reviewed and are negative.   Vitals:   08/12/24 1434  BP: (!) 160/100  Pulse: 74  Temp: 98.1 F (36.7 C)  SpO2: 95%    Physical Exam Vitals reviewed.  Constitutional:      Appearance: Normal appearance.  HENT:     Head: Normocephalic.     Mouth/Throat:     Mouth: Mucous membranes are moist.     Pharynx: Oropharynx is clear.  Eyes:     Extraocular Movements: Extraocular movements intact.     Conjunctiva/sclera: Conjunctivae normal.     Pupils: Pupils are equal, round, and reactive to light.  Cardiovascular:     Rate and Rhythm: Normal rate and regular rhythm.     Pulses: Normal pulses.     Heart sounds: Normal heart sounds.  Pulmonary:     Effort: Pulmonary effort is normal.     Breath sounds: Normal breath sounds.  Musculoskeletal:     Cervical back: No tenderness.  Lymphadenopathy:     Cervical: No  cervical adenopathy.  Skin:    General: Skin is warm and dry.     Capillary Refill: Capillary refill takes less than 2 seconds.  Neurological:     General: No focal deficit present.     Mental Status: She is alert and oriented to person, place, and time.  Psychiatric:        Mood and Affect: Mood normal.        Behavior: Behavior normal.    Results for orders placed or performed in visit on 08/12/24 (from the past 24 hours)  POCT HgB A1C     Status: Abnormal   Collection Time: 08/12/24  2:59 PM  Result Value Ref Range   Hemoglobin A1C     HbA1c POC (<> result, manual entry) 8.0 4.0 - 5.6 %   HbA1c, POC (prediabetic range)     HbA1c, POC (controlled diabetic range)        ASSESSMENT & PLAN: A total of 43 minutes was spent with the patient and counseling/coordination of care regarding preparing for this visit, review of most recent office visit notes, review of multiple chronic medical conditions and their management, cardiovascular risks associated with uncontrolled diabetes and uncontrolled hypertension, review of all medications and changes made, review of most recent bloodwork results including interpretation of today's hemoglobin A1c, review of health maintenance items, education on nutrition, prognosis, documentation, and need for follow up.   Problem List Items Addressed This Visit       Cardiovascular and Mediastinum   Hypertension associated with diabetes (HCC) - Primary   BP Readings from Last 3 Encounters:  08/12/24 (!) 160/100  05/20/24 (!) 142/85  03/31/24 (!) 138/90  Elevated blood pressure readings Recommend to increase amlodipine  to 10 mg daily and continue hydrochlorothiazide  12.5 mg Uncontrolled diabetes with hemoglobin A1c higher than before at 8.0 Continue glipizide  5 mg twice a day and metformin  1000 mg daily Continue Farxiga  10 mg daily Diet and nutrition discussed Cardiovascular risks associated with hypertension and diabetes discussed  Relevant Medications   FARXIGA  10 MG TABS tablet   amLODipine  (NORVASC ) 10 MG tablet   Other Relevant Orders   POCT HgB A1C (Completed)     Endocrine   Type 2 diabetes mellitus with hyperglycemia, without long-term current use of insulin  (HCC)   Relevant Medications   FARXIGA  10 MG TABS tablet   Dyslipidemia associated with type 2 diabetes mellitus (HCC)   Hemoglobin A1c at 8.0, still not at goal Continue glipizide  5 mg twice, metformin  1000 mg twice a day and Farxiga  10 mg daily Intolerant to statins      Relevant Medications   FARXIGA  10 MG TABS tablet     Nervous and Auditory   Osteoarthritis of spine with radiculopathy, lumbar region   Significant degenerative lumbar spine and disc disease as seen on MRI done last December Was already evaluated by sports medicine Referred for spinal injections but not helping much Needs evaluation by spinal surgeon         Other   Morbid obesity (HCC)   Associated with hypertension and diabetes Diet and nutrition discussed benefit Benefits of exercise discussed Advised to decrease amount of daily carbohydrate intake and daily calories and increase amount of plant-based protein in her diet Follow-up in 6 months      Relevant Medications   FARXIGA  10 MG TABS tablet   Neuropathic pain   Still active and affecting quality of life Recommend gabapentin  300 mg twice a day      Patient Instructions  Diabetes: Carbohydrate Counting for Adults Carbohydrate counting is a method of keeping track of how many carbohydrates you eat. Eating carbohydrates increases the amount of sugar, also called glucose, in your blood. By counting how many carbohydrates you eat, you can improve how well you manage your blood sugar. This, in turn, helps you manage your diabetes. Carbohydrates are measured in grams (g) per serving. It's important to know how many carbohydrates (in grams or by serving size) you can have in each meal. This is different for every  person. A dietitian can help you make a meal plan and calculate how many carbohydrates you should have at each meal and snack. What foods contain carbohydrates? Carbohydrates are found in these foods: Grains, such as breads and cereals. Dried beans and soy products. Starchy vegetables, such as potatoes, peas, and corn. Fruit and fruit juices. Milk and yogurt. Sweets and snack foods like cake, cookies, candy, chips, and soft drinks. How do I count carbohydrates in foods? There are two ways to count carbohydrates in food. You can read food labels or learn standard serving sizes of foods. You can use either of these methods or a combination of both. Using the Nutrition Facts label The Nutrition Facts list is included on the labels of almost all packaged foods and drinks in the [place] It includes: The serving size. Information about nutrients in each serving. This includes the grams of carbohydrate per serving. To use the Nutrition Facts, decide how many servings you will have. Then, multiply the number of servings by the number of carbohydrates per serving. The resulting number is the total grams of carbohydrates that you'll be having. Learning the standard serving sizes of foods When you eat carbohydrate foods that aren't packaged or don't include Nutrition Facts on the label, you need to measure the servings in order to count the grams of carbohydrates. Measure the foods that you'll eat with a food scale or measuring cup, if needed. Decide how many standard-size servings you'll eat. Multiply  the number of servings by 15. For foods that contain carbohydrates, one serving equals 15 g of carbohydrates. For example, if you eat 2 cups or 10 oz (300 g) of strawberries, you'll have eaten 2 servings and 30 g of carbohydrates (2 servings x 15 g = 30 g). For foods that have more than one food mixed, such as soups and casseroles, you must count the carbohydrates in each food that's included. Here's a list  of standard serving sizes for common carbohydrate-rich foods. Each of these servings has about 15 g of carbohydrates: 1 slice of bread. 1 six-inch (15 cm) tortilla. ? cup or 2 oz (53 g) of cooked rice or pasta.  cup or 3 oz (85 g) of cooked or canned, drained, and rinsed beans or lentils.  cup or 3 oz (85 g) of a starchy vegetable, such as peas, corn, or squash.  cup or 4 oz (120 g) of hot cereal.  cup or 3 oz (85 g) of boiled or mashed potatoes, or  or 3 oz (85 g) of a large baked potato.  cup or 4 fl oz (118 mL) of fruit juice. 1 cup or 8 fl oz (237 mL) of milk. 1 small or 4 oz (106 g) apple.  or 2 oz (63 g) of a medium banana. 1 cup or 5 oz (150 g) of strawberries. 3 cups or 1 oz (28.3 g) of popped popcorn. What is an example of carbohydrate counting? To calculate the grams of carbohydrates in this sample meal, follow the steps below. Sample meal 3 oz (85 g) chicken breast. ? cup or 4 oz (106 g) of brown rice.  cup or 3 oz (85 g) of corn. 1 cup or 8 fl oz (237 mL) of milk. 1 cup or 5 oz (150 g) of strawberries with sugar-free whipped topping. Carbohydrate calculation Identify the foods that have carbohydrates: Rice. Corn. Milk. Strawberries. Calculate how many servings you have of each food: 2 servings of rice. 1 serving of corn. 1 serving of milk. 1 serving of strawberries. Multiply each number of servings by 15 g: 2 servings of rice x 15 g = 30 g. 1 serving of corn x 15 g = 15 g. 1 serving of milk x 15 g = 15 g. 1 serving of strawberries x 15 g = 15 g. Add together all of the amounts to find the total grams of carbohydrates eaten: 30 g + 15 g + 15 g + 15 g = 75 g of carbohydrates total. Where to find more information To learn more, go to: American Diabetes Association at diabetes.org. Click Search and type carb counting. Find the link you need. Centers for Disease Control and Prevention at tonerpromos.no. Click Search and type diabetes. Find the link you  need. Academy of Nutrition and Dietetics: eatright.org This information is not intended to replace advice given to you by your health care provider. Make sure you discuss any questions you have with your health care provider. Document Revised: 08/13/2023 Document Reviewed: 08/13/2023 Elsevier Patient Education  2025 Elsevier Inc.    Emil Schaumann, MD [facility]

## 2024-08-12 NOTE — Assessment & Plan Note (Signed)
 Significant degenerative lumbar spine and disc disease as seen on MRI done last December Was already evaluated by sports medicine Referred for spinal injections but not helping much Needs evaluation by spinal surgeon

## 2024-08-13 ENCOUNTER — Ambulatory Visit: Payer: Self-pay | Admitting: Emergency Medicine

## 2024-08-16 ENCOUNTER — Ambulatory Visit: Admitting: Physical Therapy

## 2024-08-17 ENCOUNTER — Other Ambulatory Visit: Payer: Self-pay | Admitting: Emergency Medicine

## 2024-08-17 DIAGNOSIS — E1159 Type 2 diabetes mellitus with other circulatory complications: Secondary | ICD-10-CM

## 2024-08-17 NOTE — Telephone Encounter (Signed)
 Copied from CRM #[id number]. Topic: Clinical - Medication Refill >> Aug 17, 2024  3:46 PM Frederich PARAS wrote: Medication: FARXIGA  10 MG TABS tablet  Has the patient contacted their pharmacy? Yes They adv she give us  a call   This is the patient's preferred pharmacy:  [id number] [place], [place] - [address] [place] [address] Phone: [phone] Fax: [phone]   Is this the correct pharmacy for this prescription? Yes If no, delete pharmacy and type the correct one.   Has the prescription been filled recently? Yes  Is the patient out of the medication? Yes  Has the patient been seen for an appointment in the last year OR does the patient have an upcoming appointment? Yes  Can we respond through MyChart? Yes  Agent: Please be advised that Rx refills may take up to 3 business days. We ask that you follow-up with your pharmacy.

## 2024-08-18 ENCOUNTER — Ambulatory Visit: Admitting: Physical Therapy

## 2024-08-18 ENCOUNTER — Encounter: Payer: Self-pay | Admitting: Physical Therapy

## 2024-08-18 DIAGNOSIS — M5459 Other low back pain: Secondary | ICD-10-CM | POA: Diagnosis not present

## 2024-08-18 DIAGNOSIS — M6281 Muscle weakness (generalized): Secondary | ICD-10-CM

## 2024-08-18 DIAGNOSIS — M5416 Radiculopathy, lumbar region: Secondary | ICD-10-CM

## 2024-08-18 NOTE — Therapy (Signed)
 OUTPATIENT PHYSICAL THERAPY THORACOLUMBAR EVALUATION  Patient Name: Lindsay Travis MRN: [id number] DOB:Jan 19, 1961, 63 y.o., female Today's Date: 08/19/2024   PT End of Session - 08/18/24 1442     Visit Number 5    Number of Visits --   1-2x/week   Date for Recertification  09/15/24    Authorization Type [employer] MCD    PT Start Time 1441   pt arrived late   PT Stop Time 1504    PT Time Calculation (min) 23 min             Past Medical History:  Diagnosis Date   Anemia    Arthritis    bilateral kneed and lower legs   Clotting disorder    Diabetes mellitus    recent high blood sugar and has RX but not taking  med -b/c she doesn't think she  is diabetic, just had lots of sugar in her diet when dx'd.-on meds   Hx of adenomatous polyp of colon 07/2021   Repeat colonoscopy 2029   Hypertension    on meds   Shortness of breath    on exertion- has low hgb   Sickle cell trait    Past Surgical History:  Procedure Laterality Date   ABDOMINAL HYSTERECTOMY  09/11/2011   Procedure: HYSTERECTOMY ABDOMINAL;  Surgeon: Aida DELENA Na, MD;  Location: [facility];  Service: Gynecology;  Laterality: N/A;   COLONOSCOPY WITH PROPOFOL  N/A 07/16/2021   Procedure: COLONOSCOPY WITH PROPOFOL ;  Surgeon: Avram Lupita BRAVO, MD;  Location: [facility];  Service: Endoscopy;  Laterality: N/A;   POLYPECTOMY  07/16/2021   Procedure: POLYPECTOMY;  Surgeon: Avram Lupita BRAVO, MD;  Location: [facility];  Service: Endoscopy;;   Patient Active Problem List   Diagnosis Date Noted   Neuropathic pain 11/19/2023   Bilateral sciatica 11/19/2023   Osteoarthritis of spine with radiculopathy, lumbar region 11/19/2023   Bilateral hip pain 08/28/2023   Overactive bladder 06/25/2023   Dyslipidemia associated with type 2 diabetes mellitus (HCC) 06/19/2022   Statin myopathy 08/01/2021   Obesity, diabetes, and hypertension syndrome (HCC) 01/09/2021   Morbid obesity (HCC) 09/18/2019   Type 2 diabetes mellitus with  hyperglycemia, without long-term current use of insulin  (HCC) 08/07/2015   Varicose veins of bilateral lower extremities with other complications 02/24/2012   Uterine leiomyoma 04/18/2011   Hypertension associated with diabetes (HCC) 04/18/2011    PCP: Purcell Emil Schanz, MD  REFERRING PROVIDER: Purcell Emil Schanz, MD  THERAPY DIAG:  Other low back pain  Lumbar radiculopathy  Muscle weakness  REFERRING DIAG:  Lumbar radiculopathy [M54.16]   Rationale for Evaluation and Treatment:  Rehabilitation  SUBJECTIVE:  PERTINENT PAST HISTORY:  DM II, HTN      PRECAUTIONS: Fall, spondy  WEIGHT BEARING RESTRICTIONS No  FALLS:  Has patient fallen in last 6 months? Yes, Number of falls: 1  MOI/History of condition:  Onset date: 2 months ago   SUBJECTIVE STATEMENT  Pt arrives late.  States that she has been having more general pain recently.  From referring provider:     Red flags:  denies BB changes  Pain:  Are you having pain? Yes Pain location: B hips and legs (posterior) NPRS scale:  Worst: 9/10 Aggravating factors: standing, cooking, cleaning, walking, stairs Relieving factors: sitting Pain description: sharp and aching  Occupation: NA  Assistive Device: none  Hand Dominance: NA  Patient Goals/Specific Activities: reduce pain   OBJECTIVE:   DIAGNOSTIC FINDINGS:    GENERAL OBSERVATION/GAIT: Slow antalgic gait  SENSATION:  Light touch: Appears intact  LUMBAR AROM  AROM AROM  (Eval)  Flexion Fingertips to toes  Extension limited by 75% increases pain  Right lateral flexion limited by 25% increases pain  Left lateral flexion limited by 25% increases pain  Right rotation limited by 25%  Left rotation limited by 25%    (Blank rows = not tested)   LE MMT:  MMT Right (Eval) Left (Eval)  Hip flexion (L2, L3) 4/5 4/5  Knee extension (L3) 3+/5 3+/5  Knee flexion    Hip abduction 3+/5 3-/5  Hip extension    Hip external rotation     Hip internal rotation    Hip adduction    Ankle dorsiflexion (L4) C C  Ankle plantarflexion (S1) C C  Ankle inversion    Ankle eversion    Great Toe ext (L5) C C  Grossly     (Blank rows = not tested, score listed is out of 5 possible points.  N = WNL, D = diminished, C = clear for gross weakness with myotome testing, * = concordant pain with testing)  SPECIAL TESTS:  Straight leg raise: L (+), R (-) Slump: L (+), R (-)  MUSCLE LENGTH: Hamstrings: Right no restriction; Left no restriction Hip flexors: Right significant restriction; Left significant restriction  LE ROM:  ROM Right (Eval) Left (Eval)  Hip flexion    Hip extension    Hip abduction    Hip adduction    Hip internal rotation    Hip external rotation    Knee flexion    Knee extension    Ankle dorsiflexion    Ankle plantarflexion    Ankle inversion    Ankle eversion      (Blank rows = not tested, N = WNL, * = concordant pain with testing)  Functional Tests  Eval    30 second sit to stand 6x w/ light UE support                                                             PALPATION:   Tenderness over L lateral hip   PATIENT SURVEYS:  ODI: 24/50  HOME EXERCISE PROGRAM: Access Code: [email or web address] URL: [email or web address] Date: 07/21/2024 Prepared by: Helene Gasmen  Exercises - Supine Lower Trunk Rotation  - 1 x daily - 7 x weekly - 1 sets - 20 reps - 3 hold - Supine Hip Adduction Isometric with Ball  - 1 x daily - 7 x weekly - 2 sets - 10 reps - 10'' hold - Hooklying Isometric Clamshell  - 1 x daily - 7 x weekly - 2 sets - 10 reps - Bridge  - 1 x daily - 7 x weekly - 3 sets - 5 reps - 5-10'' hold  Treatment priorities   Eval        Hip flexor stretching        Flexion based strengthening        aquatics                         TODAY'S TREATMENT:  TREATMENT 08/18/24:  Aquatic therapy at [facility]- [address] - therapeutic pool temp 90-92 degrees    Aquatic Therapy:  Water walking for warm up fwd/lat/bkwds  Hip abd Step  ups fwd and lat Hip flexion knee straight Squats Shoulder adduction/ext/alt ext - RDB Heel raises   Pt requires the buoyancy of water for active assisted exercises with buoyancy supported for strengthening and AROM exercises. Hydrostatic pressure also supports joints by unweighting joint load by at least 50 % in 3-4 feet depth water. 80% in chest to neck deep water. Water will provide assistance with movement using the current and laminar flow while the buoyancy reduces weight bearing. Pt requires the viscosity of the water for resistance with strengthening exercises.   ASSESSMENT:  CLINICAL IMPRESSION:  08/19/2024:  Session today focused on hip and core strengthening in the aquatic environment for use of buoyancy to offload joints and the viscosity of water as resistance during therapeutic exercise.  Pt arrived late so visit was truncated.  She continue to have widespread pain which is minimal in the pool but quite limiting during ADLs.  Pt reports no increase in pain during session. Patient continues to benefit from skilled PT services on land and aquatic based and should be progressed as able to improve functional independence.   EVAL:  Lindsay Travis is a 63 y.o. female who presents to clinic with signs and sxs consistent with low back pain with radicular pain secondary to spinal stenosis.   No significant lower quarter weakness with myotome weakness or sensory deficits.   ODI shows severe disability.  Lindsay Travis will benefit from skilled PT to address relevant deficits and improve comfort and safety with daily functional mobility .   OBJECTIVE IMPAIRMENTS: Pain, LE/hip/core strength, gait, balance  ACTIVITY LIMITATIONS: walking, standing, shopping, housework, self care, recreation, social life  PERSONAL FACTORS: See medical history and pertinent history   REHAB POTENTIAL: Good  CLINICAL DECISION MAKING:  Evolving/moderate complexity  EVALUATION COMPLEXITY: Moderate   GOALS:   SHORT TERM GOALS: Target date: 08/18/2024   Lindsay Travis will be >75% HEP compliant to improve carryover between sessions and facilitate independent management of condition  Evaluation: ongoing Goal status: MET   LONG TERM GOALS: Target date: 09/15/2024   Lindsay Travis will self report >/= 33% decrease in pain from evaluation to improve function in daily tasks  Evaluation/Baseline: 9/10 max pain Goal status: INITIAL   2.  Lindsay Travis will show a >/= 8 pt improvement in their ODI score (MCID is 12% or 6/50 pts) as a proxy for functional improvement   Evaluation/Baseline: 24/50 pts Goal status: INITIAL   3.  Lindsay Travis will be able to consistently walk for up to 25 min, not limited by pain, to enable shopping and daily activities  Evaluation/Baseline: 5-15 min Goal status: INITIAL   4.  Lindsay Travis will improve 30'' STS (MCID 2) to >/= 8x (w/ UE?: y) to show improved LE strength and improved transfers   Evaluation/Baseline: 6x  w/ UE? y Goal status: INITIAL     PLAN: PT FREQUENCY: 1-2x/week  PT DURATION: 8 weeks  PLANNED INTERVENTIONS:  [id number]- PT Re-evaluation, 97110-Therapeutic exercises, 97530- Therapeutic activity, [id number]- Neuromuscular re-education, 97535- Self Care, [id number]- Manual therapy, [id number]- Gait training, [id number]- Aquatic Therapy, [id number]- Electrical stimulation (manual), [id number]- Vasopneumatic device, [id number]- Traction (mechanical), [id number]- Ionotophoresis 4mg /ml Dexamethasone , Taping, Dry Needling, Joint manipulation, and Spinal manipulation.   Lindsay Travis E Darick Fetters PT 08/19/2024, 8:56 AM

## 2024-08-24 ENCOUNTER — Ambulatory Visit: Admitting: Physical Therapy

## 2024-08-31 ENCOUNTER — Telehealth: Payer: Self-pay

## 2024-08-31 ENCOUNTER — Other Ambulatory Visit (HOSPITAL_COMMUNITY): Payer: Self-pay

## 2024-08-31 NOTE — Telephone Encounter (Signed)
 Copied from CRM #[id number]. Topic: Clinical - Medication Prior Auth >> Aug 31, 2024 12:09 PM Frederich PARAS wrote: Reason for CRM: Patient calling she never received her refill, pt was adv she needed approval from pcp. Medication is FARXIGA  10 MG TABS tablet

## 2024-08-31 NOTE — Telephone Encounter (Signed)
 Pharmacy Patient Advocate Encounter   Received notification from Physician's Office that prior authorization for Farxiga  10MG  tablets  is required/requested.   Insurance verification completed.   The patient is insured through [facility] .   Per test claim: PA required; PA submitted to above mentioned insurance via Latent Key/confirmation #/[id number] Status is pending

## 2024-08-31 NOTE — Telephone Encounter (Signed)
 Copied from CRM [id number]. Topic: Clinical - Lab/Test Results >> Aug 31, 2024 12:11 PM Frederich PARAS wrote: Reason for CRM: pt called in she says she never received feedback on labs that were done pt says she would like a callback to discuss lab results on 12/04 . Please reach out to pt at [phone]

## 2024-08-31 NOTE — Telephone Encounter (Signed)
 Called patient and went over lab results with her there were no questions or concerns

## 2024-09-01 NOTE — Telephone Encounter (Signed)
 Pharmacy Patient Advocate Encounter  Received notification from [employer] that Prior Authorization for Farxiga  10mg  tabs has been APPROVED from 08/31/24 to 08/31/25   PA #/Case ID/Reference #: [id number]

## 2024-09-01 NOTE — Telephone Encounter (Signed)
 Patient has been notified through a phone call

## 2024-09-01 NOTE — Telephone Encounter (Signed)
 Patient has been notified

## 2024-10-06 ENCOUNTER — Ambulatory Visit: Payer: Self-pay

## 2024-10-06 NOTE — Telephone Encounter (Signed)
 FYI Only or Action Required?: FYI only for provider: appointment scheduled on 1/29.  Patient was last seen in primary care on 08/12/2024 by Purcell Emil Schanz, MD.  Called Nurse Triage reporting Generalized Body Aches.  Symptoms began several weeks ago.  Interventions attempted: Prescription medications: gabapentin .  Symptoms are: unchanged.  Triage Disposition: See PCP When Office is Open (Within 3 Days)  Patient/caregiver understands and will follow disposition?: Yes   Reason for Triage: Pt stated that she is experiencing pain in both arms and legs. Pt would like to get a message sent to her provider to inform them of the pain.   Reason for Disposition  [1] MODERATE pain (e.g., interferes with normal activities) AND [2] present > 3 days  Protocols used: Muscle Aches and Body Pain-A-AH

## 2024-10-07 ENCOUNTER — Ambulatory Visit: Admitting: Emergency Medicine

## 2025-02-10 ENCOUNTER — Ambulatory Visit: Admitting: Emergency Medicine
